# Patient Record
Sex: Female | Born: 1955 | Race: Black or African American | Hispanic: No | Smoking: Former smoker
Health system: Southern US, Community
[De-identification: ages and names within clinical notes are randomized; demographics above are authoritative.]

## PROBLEM LIST (undated history)

## (undated) DIAGNOSIS — T7840XA Allergy, unspecified, initial encounter: Secondary | ICD-10-CM

## (undated) DIAGNOSIS — I10 Essential (primary) hypertension: Secondary | ICD-10-CM

## (undated) DIAGNOSIS — F1911 Other psychoactive substance abuse, in remission: Secondary | ICD-10-CM

## (undated) DIAGNOSIS — F329 Major depressive disorder, single episode, unspecified: Secondary | ICD-10-CM

## (undated) DIAGNOSIS — G510 Bell's palsy: Secondary | ICD-10-CM

## (undated) DIAGNOSIS — R87619 Unspecified abnormal cytological findings in specimens from cervix uteri: Secondary | ICD-10-CM

## (undated) DIAGNOSIS — F32A Depression, unspecified: Secondary | ICD-10-CM

## (undated) DIAGNOSIS — M1990 Unspecified osteoarthritis, unspecified site: Secondary | ICD-10-CM

## (undated) DIAGNOSIS — J45909 Unspecified asthma, uncomplicated: Secondary | ICD-10-CM

## (undated) DIAGNOSIS — F419 Anxiety disorder, unspecified: Secondary | ICD-10-CM

## (undated) DIAGNOSIS — A539 Syphilis, unspecified: Secondary | ICD-10-CM

## (undated) DIAGNOSIS — B20 Human immunodeficiency virus [HIV] disease: Secondary | ICD-10-CM

## (undated) HISTORY — DX: Essential (primary) hypertension: I10

## (undated) HISTORY — DX: Depression, unspecified: F32.A

## (undated) HISTORY — DX: Unspecified asthma, uncomplicated: J45.909

## (undated) HISTORY — DX: Unspecified osteoarthritis, unspecified site: M19.90

## (undated) HISTORY — DX: Syphilis, unspecified: A53.9

## (undated) HISTORY — DX: Bell's palsy: G51.0

## (undated) HISTORY — DX: Unspecified abnormal cytological findings in specimens from cervix uteri: R87.619

## (undated) HISTORY — DX: Other psychoactive substance abuse, in remission: F19.11

## (undated) HISTORY — DX: Human immunodeficiency virus (HIV) disease: B20

## (undated) HISTORY — PX: CHOLECYSTECTOMY: SHX55

## (undated) HISTORY — DX: Major depressive disorder, single episode, unspecified: F32.9

## (undated) HISTORY — DX: Allergy, unspecified, initial encounter: T78.40XA

## (undated) HISTORY — DX: Anxiety disorder, unspecified: F41.9

---

## 1988-11-04 DIAGNOSIS — Z21 Asymptomatic human immunodeficiency virus [HIV] infection status: Secondary | ICD-10-CM

## 1988-11-04 DIAGNOSIS — B20 Human immunodeficiency virus [HIV] disease: Secondary | ICD-10-CM

## 1988-11-04 HISTORY — DX: Asymptomatic human immunodeficiency virus (hiv) infection status: Z21

## 1988-11-04 HISTORY — DX: Human immunodeficiency virus (HIV) disease: B20

## 1996-05-04 ENCOUNTER — Encounter (INDEPENDENT_AMBULATORY_CARE_PROVIDER_SITE_OTHER): Payer: Self-pay | Admitting: *Deleted

## 1996-05-04 LAB — CONVERTED CEMR LAB
CD4 Count: 210 microliters
CD4 T Cell Abs: 210

## 1999-03-06 ENCOUNTER — Ambulatory Visit (HOSPITAL_COMMUNITY): Admission: RE | Admit: 1999-03-06 | Discharge: 1999-03-06 | Payer: Self-pay | Admitting: Internal Medicine

## 1999-03-20 ENCOUNTER — Encounter: Admission: RE | Admit: 1999-03-20 | Discharge: 1999-03-20 | Payer: Self-pay | Admitting: Internal Medicine

## 1999-03-20 ENCOUNTER — Ambulatory Visit (HOSPITAL_COMMUNITY): Admission: RE | Admit: 1999-03-20 | Discharge: 1999-03-20 | Payer: Self-pay | Admitting: Internal Medicine

## 1999-04-10 ENCOUNTER — Encounter: Admission: RE | Admit: 1999-04-10 | Discharge: 1999-04-10 | Payer: Self-pay | Admitting: Internal Medicine

## 1999-11-20 ENCOUNTER — Other Ambulatory Visit: Admission: RE | Admit: 1999-11-20 | Discharge: 1999-11-20 | Payer: Self-pay | Admitting: Obstetrics and Gynecology

## 2002-01-08 ENCOUNTER — Encounter: Payer: Self-pay | Admitting: Family Medicine

## 2002-01-08 ENCOUNTER — Encounter: Admission: RE | Admit: 2002-01-08 | Discharge: 2002-01-08 | Payer: Self-pay | Admitting: Family Medicine

## 2002-01-18 ENCOUNTER — Encounter: Admission: RE | Admit: 2002-01-18 | Discharge: 2002-01-18 | Payer: Self-pay | Admitting: Family Medicine

## 2002-01-18 ENCOUNTER — Encounter: Payer: Self-pay | Admitting: Family Medicine

## 2002-01-25 ENCOUNTER — Encounter: Admission: RE | Admit: 2002-01-25 | Discharge: 2002-01-25 | Payer: Self-pay | Admitting: Family Medicine

## 2002-01-25 ENCOUNTER — Encounter: Payer: Self-pay | Admitting: Family Medicine

## 2002-07-27 ENCOUNTER — Encounter: Admission: RE | Admit: 2002-07-27 | Discharge: 2002-07-27 | Payer: Self-pay | Admitting: Internal Medicine

## 2002-07-27 ENCOUNTER — Ambulatory Visit (HOSPITAL_COMMUNITY): Admission: RE | Admit: 2002-07-27 | Discharge: 2002-07-27 | Payer: Self-pay | Admitting: Internal Medicine

## 2002-08-17 ENCOUNTER — Encounter: Admission: RE | Admit: 2002-08-17 | Discharge: 2002-08-17 | Payer: Self-pay | Admitting: Internal Medicine

## 2002-09-28 ENCOUNTER — Ambulatory Visit (HOSPITAL_COMMUNITY): Admission: RE | Admit: 2002-09-28 | Discharge: 2002-09-28 | Payer: Self-pay | Admitting: Internal Medicine

## 2002-09-28 ENCOUNTER — Encounter: Admission: RE | Admit: 2002-09-28 | Discharge: 2002-09-28 | Payer: Self-pay | Admitting: Internal Medicine

## 2002-10-12 ENCOUNTER — Encounter: Admission: RE | Admit: 2002-10-12 | Discharge: 2002-10-12 | Payer: Self-pay | Admitting: Internal Medicine

## 2002-11-23 ENCOUNTER — Encounter: Admission: RE | Admit: 2002-11-23 | Discharge: 2002-11-23 | Payer: Self-pay | Admitting: Internal Medicine

## 2002-11-23 ENCOUNTER — Ambulatory Visit (HOSPITAL_COMMUNITY): Admission: RE | Admit: 2002-11-23 | Discharge: 2002-11-23 | Payer: Self-pay | Admitting: Internal Medicine

## 2002-12-28 ENCOUNTER — Encounter: Admission: RE | Admit: 2002-12-28 | Discharge: 2002-12-28 | Payer: Self-pay | Admitting: Internal Medicine

## 2003-01-14 ENCOUNTER — Encounter: Admission: RE | Admit: 2003-01-14 | Discharge: 2003-01-14 | Payer: Self-pay | Admitting: Infectious Diseases

## 2003-02-14 ENCOUNTER — Encounter: Payer: Self-pay | Admitting: Internal Medicine

## 2003-02-14 ENCOUNTER — Encounter: Admission: RE | Admit: 2003-02-14 | Discharge: 2003-02-14 | Payer: Self-pay | Admitting: Internal Medicine

## 2003-03-08 ENCOUNTER — Encounter: Admission: RE | Admit: 2003-03-08 | Discharge: 2003-03-08 | Payer: Self-pay | Admitting: Internal Medicine

## 2003-06-23 ENCOUNTER — Encounter: Payer: Self-pay | Admitting: Internal Medicine

## 2003-06-23 ENCOUNTER — Encounter: Admission: RE | Admit: 2003-06-23 | Discharge: 2003-06-23 | Payer: Self-pay | Admitting: Internal Medicine

## 2003-06-23 ENCOUNTER — Ambulatory Visit (HOSPITAL_COMMUNITY): Admission: RE | Admit: 2003-06-23 | Discharge: 2003-06-23 | Payer: Self-pay | Admitting: Internal Medicine

## 2003-10-11 ENCOUNTER — Ambulatory Visit (HOSPITAL_COMMUNITY): Admission: RE | Admit: 2003-10-11 | Discharge: 2003-10-11 | Payer: Self-pay | Admitting: Internal Medicine

## 2003-10-11 ENCOUNTER — Encounter: Payer: Self-pay | Admitting: Internal Medicine

## 2003-10-11 ENCOUNTER — Encounter: Admission: RE | Admit: 2003-10-11 | Discharge: 2003-10-11 | Payer: Self-pay | Admitting: Internal Medicine

## 2003-10-27 ENCOUNTER — Other Ambulatory Visit: Admission: RE | Admit: 2003-10-27 | Discharge: 2003-10-27 | Payer: Self-pay | Admitting: Obstetrics and Gynecology

## 2003-11-01 ENCOUNTER — Encounter: Admission: RE | Admit: 2003-11-01 | Discharge: 2003-11-01 | Payer: Self-pay | Admitting: Internal Medicine

## 2003-11-22 ENCOUNTER — Encounter: Admission: RE | Admit: 2003-11-22 | Discharge: 2003-11-22 | Payer: Self-pay | Admitting: Family Medicine

## 2004-01-17 ENCOUNTER — Encounter: Admission: RE | Admit: 2004-01-17 | Discharge: 2004-01-17 | Payer: Self-pay | Admitting: Internal Medicine

## 2004-01-17 ENCOUNTER — Ambulatory Visit (HOSPITAL_COMMUNITY): Admission: RE | Admit: 2004-01-17 | Discharge: 2004-01-17 | Payer: Self-pay | Admitting: Internal Medicine

## 2004-01-31 ENCOUNTER — Encounter: Admission: RE | Admit: 2004-01-31 | Discharge: 2004-01-31 | Payer: Self-pay | Admitting: Internal Medicine

## 2004-04-17 ENCOUNTER — Encounter: Admission: RE | Admit: 2004-04-17 | Discharge: 2004-04-17 | Payer: Self-pay | Admitting: Internal Medicine

## 2004-04-17 ENCOUNTER — Ambulatory Visit (HOSPITAL_COMMUNITY): Admission: RE | Admit: 2004-04-17 | Discharge: 2004-04-17 | Payer: Self-pay | Admitting: Internal Medicine

## 2004-05-01 ENCOUNTER — Encounter: Admission: RE | Admit: 2004-05-01 | Discharge: 2004-05-01 | Payer: Self-pay | Admitting: Internal Medicine

## 2004-06-07 ENCOUNTER — Encounter: Admission: RE | Admit: 2004-06-07 | Discharge: 2004-06-07 | Payer: Self-pay | Admitting: Infectious Diseases

## 2004-06-10 ENCOUNTER — Emergency Department (HOSPITAL_COMMUNITY): Admission: EM | Admit: 2004-06-10 | Discharge: 2004-06-11 | Payer: Self-pay | Admitting: Emergency Medicine

## 2004-06-13 ENCOUNTER — Encounter: Admission: RE | Admit: 2004-06-13 | Discharge: 2004-06-13 | Payer: Self-pay | Admitting: Internal Medicine

## 2004-06-13 ENCOUNTER — Ambulatory Visit (HOSPITAL_COMMUNITY): Admission: RE | Admit: 2004-06-13 | Discharge: 2004-06-13 | Payer: Self-pay | Admitting: Obstetrics and Gynecology

## 2004-08-15 ENCOUNTER — Ambulatory Visit: Payer: Self-pay | Admitting: Internal Medicine

## 2004-08-15 ENCOUNTER — Ambulatory Visit (HOSPITAL_COMMUNITY): Admission: RE | Admit: 2004-08-15 | Discharge: 2004-08-15 | Payer: Self-pay | Admitting: Internal Medicine

## 2004-08-28 ENCOUNTER — Ambulatory Visit: Payer: Self-pay | Admitting: Internal Medicine

## 2005-03-06 ENCOUNTER — Ambulatory Visit (HOSPITAL_COMMUNITY): Admission: RE | Admit: 2005-03-06 | Discharge: 2005-03-06 | Payer: Self-pay | Admitting: Internal Medicine

## 2005-03-06 ENCOUNTER — Ambulatory Visit: Payer: Self-pay | Admitting: Internal Medicine

## 2005-03-19 ENCOUNTER — Ambulatory Visit: Payer: Self-pay | Admitting: Internal Medicine

## 2005-05-10 ENCOUNTER — Ambulatory Visit: Payer: Self-pay | Admitting: Internal Medicine

## 2005-06-21 ENCOUNTER — Ambulatory Visit (HOSPITAL_COMMUNITY): Admission: RE | Admit: 2005-06-21 | Discharge: 2005-06-21 | Payer: Self-pay | Admitting: Infectious Diseases

## 2005-06-21 ENCOUNTER — Ambulatory Visit: Payer: Self-pay | Admitting: Infectious Diseases

## 2005-09-10 ENCOUNTER — Ambulatory Visit: Payer: Self-pay | Admitting: Infectious Diseases

## 2005-09-10 ENCOUNTER — Ambulatory Visit (HOSPITAL_COMMUNITY): Admission: RE | Admit: 2005-09-10 | Discharge: 2005-09-10 | Payer: Self-pay | Admitting: Internal Medicine

## 2005-09-10 ENCOUNTER — Encounter (INDEPENDENT_AMBULATORY_CARE_PROVIDER_SITE_OTHER): Payer: Self-pay | Admitting: *Deleted

## 2005-09-10 LAB — CONVERTED CEMR LAB
CD4 Count: 390 microliters
HIV 1 RNA Quant: 399 copies/mL

## 2005-09-23 ENCOUNTER — Ambulatory Visit: Payer: Self-pay | Admitting: Infectious Diseases

## 2006-04-02 ENCOUNTER — Encounter: Admission: RE | Admit: 2006-04-02 | Discharge: 2006-04-02 | Payer: Self-pay | Admitting: Internal Medicine

## 2006-04-02 ENCOUNTER — Encounter (INDEPENDENT_AMBULATORY_CARE_PROVIDER_SITE_OTHER): Payer: Self-pay | Admitting: *Deleted

## 2006-04-02 ENCOUNTER — Ambulatory Visit: Payer: Self-pay | Admitting: Internal Medicine

## 2006-04-02 LAB — CONVERTED CEMR LAB
CD4 Count: 520 microliters
HIV 1 RNA Quant: 399 copies/mL

## 2006-04-15 ENCOUNTER — Ambulatory Visit: Payer: Self-pay | Admitting: Internal Medicine

## 2006-08-07 ENCOUNTER — Ambulatory Visit: Payer: Self-pay | Admitting: Family Medicine

## 2006-09-06 DIAGNOSIS — F411 Generalized anxiety disorder: Secondary | ICD-10-CM | POA: Insufficient documentation

## 2006-09-06 DIAGNOSIS — F329 Major depressive disorder, single episode, unspecified: Secondary | ICD-10-CM

## 2006-09-06 DIAGNOSIS — R87619 Unspecified abnormal cytological findings in specimens from cervix uteri: Secondary | ICD-10-CM | POA: Insufficient documentation

## 2006-09-06 DIAGNOSIS — B20 Human immunodeficiency virus [HIV] disease: Secondary | ICD-10-CM | POA: Insufficient documentation

## 2006-09-06 DIAGNOSIS — F32A Depression, unspecified: Secondary | ICD-10-CM | POA: Insufficient documentation

## 2006-09-06 DIAGNOSIS — A539 Syphilis, unspecified: Secondary | ICD-10-CM | POA: Insufficient documentation

## 2006-09-06 DIAGNOSIS — G510 Bell's palsy: Secondary | ICD-10-CM | POA: Insufficient documentation

## 2006-09-06 DIAGNOSIS — A6000 Herpesviral infection of urogenital system, unspecified: Secondary | ICD-10-CM | POA: Insufficient documentation

## 2006-10-13 ENCOUNTER — Ambulatory Visit: Payer: Self-pay | Admitting: Family Medicine

## 2006-11-06 ENCOUNTER — Ambulatory Visit: Payer: Self-pay | Admitting: Internal Medicine

## 2006-11-12 ENCOUNTER — Ambulatory Visit: Payer: Self-pay | Admitting: Family Medicine

## 2006-12-02 ENCOUNTER — Encounter: Admission: RE | Admit: 2006-12-02 | Discharge: 2006-12-02 | Payer: Self-pay | Admitting: Internal Medicine

## 2006-12-02 ENCOUNTER — Ambulatory Visit: Payer: Self-pay | Admitting: Internal Medicine

## 2006-12-02 ENCOUNTER — Encounter (INDEPENDENT_AMBULATORY_CARE_PROVIDER_SITE_OTHER): Payer: Self-pay | Admitting: *Deleted

## 2006-12-02 LAB — CONVERTED CEMR LAB
ALT: 18 units/L (ref 0–35)
AST: 15 units/L (ref 0–37)
Albumin: 3.9 g/dL (ref 3.5–5.2)
Alkaline Phosphatase: 108 units/L (ref 39–117)
BUN: 10 mg/dL (ref 6–23)
Basophils Absolute: 0 10*3/uL (ref 0.0–0.1)
Basophils Relative: 1 % (ref 0–1)
CD4 Count: 370 microliters
CO2: 28 meq/L (ref 19–32)
Calcium: 8.8 mg/dL (ref 8.4–10.5)
Chloride: 106 meq/L (ref 96–112)
Creatinine, Ser: 0.85 mg/dL (ref 0.40–1.20)
Eosinophils Absolute: 0.3 10*3/uL (ref 0.0–0.7)
Eosinophils Relative: 8 % — ABNORMAL HIGH (ref 0–5)
Glucose, Bld: 84 mg/dL (ref 70–99)
HCT: 37.9 % (ref 36.0–46.0)
HIV 1 RNA Quant: 49 copies/mL
HIV 1 RNA Quant: <50 copies/mL (ref <50)
HIV-1 RNA Quant, Log: <1.7 (ref <1.70)
Hemoglobin: 12.2 g/dL (ref 12.0–15.0)
Lymphocytes Relative: 53 % — ABNORMAL HIGH (ref 12–46)
Lymphs Abs: 1.7 10*3/uL (ref 0.7–3.3)
MCHC: 32.2 g/dL (ref 30.0–36.0)
MCV: 90 fL (ref 78.0–100.0)
Monocytes Absolute: 0.3 10*3/uL (ref 0.2–0.7)
Monocytes Relative: 10 % (ref 3–11)
Neutro Abs: 0.9 10*3/uL — ABNORMAL LOW (ref 1.7–7.7)
Neutrophils Relative %: 28 % — ABNORMAL LOW (ref 43–77)
Platelets: 211 10*3/uL (ref 150–400)
Potassium: 4 meq/L (ref 3.5–5.3)
RBC: 4.21 M/uL (ref 3.87–5.11)
RDW: 13.6 % (ref 11.5–14.0)
Sodium: 142 meq/L (ref 135–145)
Total Bilirubin: 0.3 mg/dL (ref 0.3–1.2)
Total Protein: 6.7 g/dL (ref 6.0–8.3)
WBC: 3.3 10*3/uL — ABNORMAL LOW (ref 4.0–10.5)

## 2006-12-16 ENCOUNTER — Ambulatory Visit: Payer: Self-pay | Admitting: Internal Medicine

## 2006-12-25 ENCOUNTER — Encounter: Payer: Self-pay | Admitting: Licensed Clinical Social Worker

## 2006-12-29 ENCOUNTER — Encounter (INDEPENDENT_AMBULATORY_CARE_PROVIDER_SITE_OTHER): Payer: Self-pay | Admitting: *Deleted

## 2006-12-29 LAB — CONVERTED CEMR LAB: Pap Smear: ABNORMAL

## 2007-01-01 ENCOUNTER — Encounter: Payer: Self-pay | Admitting: Internal Medicine

## 2007-01-01 ENCOUNTER — Encounter: Payer: Self-pay | Admitting: Licensed Clinical Social Worker

## 2007-01-02 ENCOUNTER — Encounter: Payer: Self-pay | Admitting: Internal Medicine

## 2007-01-11 ENCOUNTER — Encounter (INDEPENDENT_AMBULATORY_CARE_PROVIDER_SITE_OTHER): Payer: Self-pay | Admitting: *Deleted

## 2007-01-15 ENCOUNTER — Encounter (INDEPENDENT_AMBULATORY_CARE_PROVIDER_SITE_OTHER): Payer: Self-pay | Admitting: *Deleted

## 2007-07-08 ENCOUNTER — Encounter: Admission: RE | Admit: 2007-07-08 | Discharge: 2007-07-08 | Payer: Self-pay | Admitting: Internal Medicine

## 2007-07-08 ENCOUNTER — Ambulatory Visit: Payer: Self-pay | Admitting: Internal Medicine

## 2007-07-08 LAB — CONVERTED CEMR LAB
ALT: 24 units/L (ref 0–35)
AST: 20 units/L (ref 0–37)
Albumin: 4.1 g/dL (ref 3.5–5.2)
Alkaline Phosphatase: 109 units/L (ref 39–117)
BUN: 14 mg/dL (ref 6–23)
Basophils Absolute: 0 10*3/uL (ref 0.0–0.1)
Basophils Relative: 0 % (ref 0–1)
CO2: 21 meq/L (ref 19–32)
Calcium: 8.7 mg/dL (ref 8.4–10.5)
Chloride: 109 meq/L (ref 96–112)
Creatinine, Ser: 1 mg/dL (ref 0.40–1.20)
Eosinophils Absolute: 0.2 10*3/uL (ref 0.0–0.7)
Eosinophils Relative: 4 % (ref 0–5)
Glucose, Bld: 95 mg/dL (ref 70–99)
HCT: 39.8 % (ref 36.0–46.0)
HIV 1 RNA Quant: <50 copies/mL (ref <50)
HIV-1 RNA Quant, Log: <1.7 (ref <1.70)
Hemoglobin: 13.1 g/dL (ref 12.0–15.0)
Lymphocytes Relative: 43 % (ref 12–46)
Lymphs Abs: 1.9 10*3/uL (ref 0.7–3.3)
MCHC: 32.9 g/dL (ref 30.0–36.0)
MCV: 88.6 fL (ref 78.0–100.0)
Monocytes Absolute: 0.4 10*3/uL (ref 0.2–0.7)
Monocytes Relative: 9 % (ref 3–11)
Neutro Abs: 2 10*3/uL (ref 1.7–7.7)
Neutrophils Relative %: 43 % (ref 43–77)
Platelets: 226 10*3/uL (ref 150–400)
Potassium: 4.2 meq/L (ref 3.5–5.3)
RBC: 4.49 M/uL (ref 3.87–5.11)
RDW: 13.9 % (ref 11.5–14.0)
Sodium: 142 meq/L (ref 135–145)
Total Bilirubin: 0.4 mg/dL (ref 0.3–1.2)
Total Protein: 7.2 g/dL (ref 6.0–8.3)
WBC: 4.5 10*3/uL (ref 4.0–10.5)

## 2007-07-22 ENCOUNTER — Ambulatory Visit: Payer: Self-pay | Admitting: Internal Medicine

## 2007-08-06 ENCOUNTER — Telehealth (INDEPENDENT_AMBULATORY_CARE_PROVIDER_SITE_OTHER): Payer: Self-pay | Admitting: *Deleted

## 2007-08-10 ENCOUNTER — Telehealth: Payer: Self-pay | Admitting: Internal Medicine

## 2007-09-01 ENCOUNTER — Encounter: Admission: RE | Admit: 2007-09-01 | Discharge: 2007-09-01 | Payer: Self-pay | Admitting: Internal Medicine

## 2007-09-01 ENCOUNTER — Ambulatory Visit: Payer: Self-pay | Admitting: Internal Medicine

## 2007-09-01 LAB — CONVERTED CEMR LAB
HIV 1 RNA Quant: 139 copies/mL — ABNORMAL HIGH (ref <50)
HIV-1 RNA Quant, Log: 2.14 — ABNORMAL HIGH (ref <1.70)

## 2007-10-27 ENCOUNTER — Ambulatory Visit: Payer: Self-pay | Admitting: Internal Medicine

## 2007-11-02 ENCOUNTER — Encounter (INDEPENDENT_AMBULATORY_CARE_PROVIDER_SITE_OTHER): Payer: Self-pay | Admitting: *Deleted

## 2007-11-02 ENCOUNTER — Telehealth: Payer: Self-pay | Admitting: Internal Medicine

## 2007-11-03 ENCOUNTER — Ambulatory Visit: Payer: Self-pay | Admitting: Internal Medicine

## 2007-11-03 ENCOUNTER — Encounter (INDEPENDENT_AMBULATORY_CARE_PROVIDER_SITE_OTHER): Payer: Self-pay | Admitting: *Deleted

## 2007-11-19 ENCOUNTER — Telehealth: Payer: Self-pay | Admitting: Internal Medicine

## 2007-12-13 ENCOUNTER — Emergency Department (HOSPITAL_COMMUNITY): Admission: EM | Admit: 2007-12-13 | Discharge: 2007-12-13 | Payer: Self-pay | Admitting: Emergency Medicine

## 2007-12-16 ENCOUNTER — Encounter (INDEPENDENT_AMBULATORY_CARE_PROVIDER_SITE_OTHER): Payer: Self-pay | Admitting: *Deleted

## 2008-01-08 ENCOUNTER — Ambulatory Visit: Payer: Self-pay | Admitting: Internal Medicine

## 2008-01-08 ENCOUNTER — Encounter: Admission: RE | Admit: 2008-01-08 | Discharge: 2008-01-08 | Payer: Self-pay | Admitting: Internal Medicine

## 2008-01-08 LAB — CONVERTED CEMR LAB
ALT: 19 units/L (ref 0–35)
AST: 20 units/L (ref 0–37)
Albumin: 3.8 g/dL (ref 3.5–5.2)
Alkaline Phosphatase: 65 units/L (ref 39–117)
BUN: 10 mg/dL (ref 6–23)
Basophils Absolute: 0 10*3/uL (ref 0.0–0.1)
Basophils Relative: 1 % (ref 0–1)
CO2: 21 meq/L (ref 19–32)
Calcium: 8.8 mg/dL (ref 8.4–10.5)
Chloride: 107 meq/L (ref 96–112)
Cholesterol: 197 mg/dL (ref 0–200)
Creatinine, Ser: 0.87 mg/dL (ref 0.40–1.20)
Eosinophils Absolute: 0.1 10*3/uL (ref 0.0–0.7)
Eosinophils Relative: 3 % (ref 0–5)
Glucose, Bld: 87 mg/dL (ref 70–99)
HCT: 38.5 % (ref 36.0–46.0)
HDL: 43 mg/dL (ref >39)
HIV 1 RNA Quant: 133 copies/mL — ABNORMAL HIGH (ref <50)
HIV-1 RNA Quant, Log: 2.12 — ABNORMAL HIGH (ref <1.70)
Hemoglobin: 12.3 g/dL (ref 12.0–15.0)
LDL Cholesterol: 118 mg/dL — ABNORMAL HIGH (ref 0–99)
Lymphocytes Relative: 62 % — ABNORMAL HIGH (ref 12–46)
Lymphs Abs: 2.5 10*3/uL (ref 0.7–4.0)
MCHC: 31.9 g/dL (ref 30.0–36.0)
MCV: 88.9 fL (ref 78.0–100.0)
Monocytes Absolute: 0.4 10*3/uL (ref 0.1–1.0)
Monocytes Relative: 9 % (ref 3–12)
Neutro Abs: 1 10*3/uL — ABNORMAL LOW (ref 1.7–7.7)
Neutrophils Relative %: 26 % — ABNORMAL LOW (ref 43–77)
Platelets: 185 10*3/uL (ref 150–400)
Potassium: 3.9 meq/L (ref 3.5–5.3)
RBC: 4.33 M/uL (ref 3.87–5.11)
RDW: 15.2 % (ref 11.5–15.5)
Sodium: 141 meq/L (ref 135–145)
Total Bilirubin: 0.9 mg/dL (ref 0.3–1.2)
Total CHOL/HDL Ratio: 4.6
Total Protein: 7.3 g/dL (ref 6.0–8.3)
Triglycerides: 182 mg/dL — ABNORMAL HIGH (ref <150)
VLDL: 36 mg/dL (ref 0–40)
WBC: 4 10*3/uL (ref 4.0–10.5)

## 2008-01-22 ENCOUNTER — Encounter (INDEPENDENT_AMBULATORY_CARE_PROVIDER_SITE_OTHER): Payer: Self-pay | Admitting: *Deleted

## 2008-04-04 ENCOUNTER — Telehealth (INDEPENDENT_AMBULATORY_CARE_PROVIDER_SITE_OTHER): Payer: Self-pay | Admitting: *Deleted

## 2008-04-05 ENCOUNTER — Ambulatory Visit: Payer: Self-pay | Admitting: Internal Medicine

## 2008-04-07 ENCOUNTER — Telehealth: Payer: Self-pay | Admitting: Internal Medicine

## 2008-05-02 ENCOUNTER — Encounter: Payer: Self-pay | Admitting: Internal Medicine

## 2008-05-02 ENCOUNTER — Ambulatory Visit: Payer: Self-pay | Admitting: Internal Medicine

## 2008-05-02 LAB — CONVERTED CEMR LAB: Pap Smear: NORMAL

## 2008-05-10 ENCOUNTER — Encounter: Payer: Self-pay | Admitting: Internal Medicine

## 2008-05-31 ENCOUNTER — Telehealth: Payer: Self-pay | Admitting: Internal Medicine

## 2008-06-02 ENCOUNTER — Ambulatory Visit: Payer: Self-pay | Admitting: Internal Medicine

## 2008-06-10 ENCOUNTER — Emergency Department (HOSPITAL_COMMUNITY): Admission: EM | Admit: 2008-06-10 | Discharge: 2008-06-10 | Payer: Self-pay | Admitting: Emergency Medicine

## 2008-10-07 ENCOUNTER — Telehealth: Payer: Self-pay | Admitting: Internal Medicine

## 2008-10-07 ENCOUNTER — Encounter (INDEPENDENT_AMBULATORY_CARE_PROVIDER_SITE_OTHER): Payer: Self-pay | Admitting: *Deleted

## 2008-12-12 ENCOUNTER — Emergency Department (HOSPITAL_COMMUNITY): Admission: EM | Admit: 2008-12-12 | Discharge: 2008-12-12 | Payer: Self-pay | Admitting: Family Medicine

## 2008-12-14 ENCOUNTER — Emergency Department (HOSPITAL_COMMUNITY): Admission: EM | Admit: 2008-12-14 | Discharge: 2008-12-14 | Payer: Self-pay | Admitting: Family Medicine

## 2008-12-15 ENCOUNTER — Telehealth (INDEPENDENT_AMBULATORY_CARE_PROVIDER_SITE_OTHER): Payer: Self-pay | Admitting: *Deleted

## 2009-03-02 ENCOUNTER — Encounter: Payer: Self-pay | Admitting: Family Medicine

## 2009-03-02 ENCOUNTER — Ambulatory Visit: Payer: Self-pay | Admitting: Family Medicine

## 2009-03-02 ENCOUNTER — Encounter (INDEPENDENT_AMBULATORY_CARE_PROVIDER_SITE_OTHER): Payer: Self-pay | Admitting: *Deleted

## 2009-03-02 ENCOUNTER — Other Ambulatory Visit: Admission: RE | Admit: 2009-03-02 | Discharge: 2009-03-02 | Payer: Self-pay | Admitting: Family Medicine

## 2009-03-02 LAB — CONVERTED CEMR LAB: Pap Smear: NEGATIVE

## 2009-03-07 ENCOUNTER — Encounter (INDEPENDENT_AMBULATORY_CARE_PROVIDER_SITE_OTHER): Payer: Self-pay | Admitting: *Deleted

## 2009-03-07 ENCOUNTER — Telehealth (INDEPENDENT_AMBULATORY_CARE_PROVIDER_SITE_OTHER): Payer: Self-pay | Admitting: *Deleted

## 2009-03-22 ENCOUNTER — Ambulatory Visit: Payer: Self-pay | Admitting: Hematology & Oncology

## 2009-03-22 ENCOUNTER — Ambulatory Visit: Payer: Self-pay | Admitting: Family Medicine

## 2009-03-22 DIAGNOSIS — D708 Other neutropenia: Secondary | ICD-10-CM | POA: Insufficient documentation

## 2009-03-22 DIAGNOSIS — J329 Chronic sinusitis, unspecified: Secondary | ICD-10-CM | POA: Insufficient documentation

## 2009-03-22 LAB — CONVERTED CEMR LAB
ALT: 41 units/L — ABNORMAL HIGH (ref 0–35)
AST: 37 units/L (ref 0–37)
Albumin: 3.3 g/dL — ABNORMAL LOW (ref 3.5–5.2)
Alkaline Phosphatase: 93 units/L (ref 39–117)
Basophils Absolute: 0 10*3/uL (ref 0.0–0.1)
Basophils Relative: 0.3 % (ref 0.0–3.0)
Bilirubin, Direct: 0 mg/dL (ref 0.0–0.3)
Eosinophils Absolute: 0.1 10*3/uL (ref 0.0–0.7)
Eosinophils Relative: 3.5 % (ref 0.0–5.0)
HCT: 36.6 % (ref 36.0–46.0)
Hemoglobin: 12.3 g/dL (ref 12.0–15.0)
Lymphocytes Relative: 60.8 % — ABNORMAL HIGH (ref 12.0–46.0)
Lymphs Abs: 2.6 10*3/uL (ref 0.7–4.0)
MCHC: 33.6 g/dL (ref 30.0–36.0)
MCV: 85.1 fL (ref 78.0–100.0)
Monocytes Absolute: 0.5 10*3/uL (ref 0.1–1.0)
Monocytes Relative: 11.6 % (ref 3.0–12.0)
Neutro Abs: 1 10*3/uL — ABNORMAL LOW (ref 1.4–7.7)
Neutrophils Relative %: 23.8 % — ABNORMAL LOW (ref 43.0–77.0)
Platelets: 174 10*3/uL (ref 150.0–400.0)
RBC: 4.29 M/uL (ref 3.87–5.11)
RDW: 12.7 % (ref 11.5–14.6)
Total Bilirubin: 0.8 mg/dL (ref 0.3–1.2)
Total Protein: 7.8 g/dL (ref 6.0–8.3)
WBC: 4.2 10*3/uL — ABNORMAL LOW (ref 4.5–10.5)

## 2009-04-21 ENCOUNTER — Encounter: Payer: Self-pay | Admitting: Family Medicine

## 2009-04-21 LAB — CBC WITH DIFFERENTIAL (CANCER CENTER ONLY)
BASO#: 0 10*3/uL (ref 0.0–0.2)
BASO%: 0.5 % (ref 0.0–2.0)
EOS%: 1.6 % (ref 0.0–7.0)
Eosinophils Absolute: 0.1 10*3/uL (ref 0.0–0.5)
HCT: 38.1 % (ref 34.8–46.6)
HGB: 12.3 g/dL (ref 11.6–15.9)
LYMPH#: 2.9 10*3/uL (ref 0.9–3.3)
LYMPH%: 68.1 % — ABNORMAL HIGH (ref 14.0–48.0)
MCH: 27 pg (ref 26.0–34.0)
MCHC: 32.3 g/dL (ref 32.0–36.0)
MCV: 84 fL (ref 81–101)
MONO#: 0.3 10*3/uL (ref 0.1–0.9)
MONO%: 7.6 % (ref 0.0–13.0)
NEUT#: 0.9 10*3/uL — ABNORMAL LOW (ref 1.5–6.5)
NEUT%: 22.2 % — ABNORMAL LOW (ref 39.6–80.0)
Platelets: 206 10*3/uL (ref 145–400)
RBC: 4.56 10*6/uL (ref 3.70–5.32)
RDW: 11.7 % (ref 10.5–14.6)
WBC: 4.2 10*3/uL (ref 3.9–10.0)

## 2009-04-21 LAB — CHCC SATELLITE - SMEAR

## 2009-04-25 LAB — PROTEIN ELECTROPHORESIS, SERUM
Albumin ELP: 48.2 % — ABNORMAL LOW (ref 55.8–66.1)
Alpha-1-Globulin: 3.6 % (ref 2.9–4.9)
Alpha-2-Globulin: 8.9 % (ref 7.1–11.8)
Beta 2: 6.5 % (ref 3.2–6.5)
Beta Globulin: 6.9 % (ref 4.7–7.2)
Gamma Globulin: 25.9 % — ABNORMAL HIGH (ref 11.1–18.8)
Total Protein, Serum Electrophoresis: 8.5 g/dL — ABNORMAL HIGH (ref 6.0–8.3)

## 2009-04-25 LAB — ANA: Anti Nuclear Antibody(ANA): NEGATIVE

## 2009-04-25 LAB — RHEUMATOID FACTOR: Rhuematoid fact SerPl-aCnc: 22 IU/mL — ABNORMAL HIGH (ref 0–20)

## 2009-04-25 LAB — VITAMIN B12: Vitamin B-12: 763 pg/mL (ref 211–911)

## 2009-05-14 ENCOUNTER — Telehealth: Payer: Self-pay | Admitting: Family Medicine

## 2009-05-16 ENCOUNTER — Encounter (INDEPENDENT_AMBULATORY_CARE_PROVIDER_SITE_OTHER): Payer: Self-pay | Admitting: *Deleted

## 2009-06-26 ENCOUNTER — Ambulatory Visit: Payer: Self-pay | Admitting: Family Medicine

## 2009-06-29 ENCOUNTER — Telehealth (INDEPENDENT_AMBULATORY_CARE_PROVIDER_SITE_OTHER): Payer: Self-pay | Admitting: *Deleted

## 2009-07-06 ENCOUNTER — Ambulatory Visit: Payer: Self-pay | Admitting: Family Medicine

## 2009-08-03 ENCOUNTER — Ambulatory Visit: Payer: Self-pay | Admitting: Family Medicine

## 2009-08-03 DIAGNOSIS — J45901 Unspecified asthma with (acute) exacerbation: Secondary | ICD-10-CM | POA: Insufficient documentation

## 2009-11-24 ENCOUNTER — Encounter (INDEPENDENT_AMBULATORY_CARE_PROVIDER_SITE_OTHER): Payer: Self-pay | Admitting: *Deleted

## 2009-11-24 ENCOUNTER — Ambulatory Visit: Payer: Self-pay | Admitting: Family Medicine

## 2010-02-22 ENCOUNTER — Ambulatory Visit: Payer: Self-pay | Admitting: Family Medicine

## 2010-04-17 ENCOUNTER — Ambulatory Visit: Payer: Self-pay | Admitting: Family Medicine

## 2010-04-17 ENCOUNTER — Encounter (INDEPENDENT_AMBULATORY_CARE_PROVIDER_SITE_OTHER): Payer: Self-pay | Admitting: *Deleted

## 2010-04-18 ENCOUNTER — Telehealth: Payer: Self-pay | Admitting: Family Medicine

## 2010-04-19 ENCOUNTER — Telehealth (INDEPENDENT_AMBULATORY_CARE_PROVIDER_SITE_OTHER): Payer: Self-pay | Admitting: *Deleted

## 2010-04-24 ENCOUNTER — Telehealth (INDEPENDENT_AMBULATORY_CARE_PROVIDER_SITE_OTHER): Payer: Self-pay | Admitting: *Deleted

## 2010-05-23 ENCOUNTER — Encounter: Payer: Self-pay | Admitting: Internal Medicine

## 2010-05-29 ENCOUNTER — Encounter: Payer: Self-pay | Admitting: Internal Medicine

## 2010-07-20 ENCOUNTER — Encounter: Payer: Self-pay | Admitting: Internal Medicine

## 2010-07-23 ENCOUNTER — Encounter: Payer: Self-pay | Admitting: Family Medicine

## 2010-07-23 ENCOUNTER — Ambulatory Visit: Payer: Self-pay | Admitting: Internal Medicine

## 2010-07-23 LAB — CONVERTED CEMR LAB
ALT: 14 U/L (ref 0–35)
AST: 20 U/L (ref 0–37)
Albumin: 3.7 g/dL (ref 3.5–5.2)
Alkaline Phosphatase: 98 U/L (ref 39–117)
BUN: 8 mg/dL (ref 6–23)
Basophils Absolute: 0 K/uL (ref 0.0–0.1)
Basophils Relative: 1 % (ref 0–1)
CO2: 26 meq/L (ref 19–32)
Calcium: 9.1 mg/dL (ref 8.4–10.5)
Chloride: 106 meq/L (ref 96–112)
Creatinine, Ser: 0.96 mg/dL (ref 0.40–1.20)
Eosinophils Absolute: 0.1 K/uL (ref 0.0–0.7)
Eosinophils Relative: 3 % (ref 0–5)
Glucose, Bld: 85 mg/dL (ref 70–99)
HCT: 36.1 % (ref 36.0–46.0)
HIV 1 RNA Quant: <20 copies/mL (ref <20)
HIV-1 RNA Quant, Log: <1.3 (ref <1.30)
Hemoglobin: 12 g/dL (ref 12.0–15.0)
Lymphocytes Relative: 56 % — ABNORMAL HIGH (ref 12–46)
Lymphs Abs: 2.2 K/uL (ref 0.7–4.0)
MCHC: 33.2 g/dL (ref 30.0–36.0)
MCV: 87 fL (ref 78.0–100.0)
Monocytes Absolute: 0.3 K/uL (ref 0.1–1.0)
Monocytes Relative: 7 % (ref 3–12)
Neutro Abs: 1.3 K/uL — ABNORMAL LOW (ref 1.7–7.7)
Neutrophils Relative %: 33 % — ABNORMAL LOW (ref 43–77)
Platelets: 196 K/uL (ref 150–400)
Potassium: 3.7 meq/L (ref 3.5–5.3)
RBC: 4.15 M/uL (ref 3.87–5.11)
RDW: 14.9 % (ref 11.5–15.5)
Sodium: 140 meq/L (ref 135–145)
Total Bilirubin: 0.6 mg/dL (ref 0.3–1.2)
Total Protein: 7.6 g/dL (ref 6.0–8.3)
WBC: 3.9 10*3/microliter — ABNORMAL LOW (ref 4.0–10.5)

## 2010-08-01 ENCOUNTER — Encounter: Payer: Self-pay | Admitting: Family Medicine

## 2010-08-01 ENCOUNTER — Encounter: Admission: RE | Admit: 2010-08-01 | Discharge: 2010-08-01 | Payer: Self-pay | Admitting: Allergy

## 2010-09-25 ENCOUNTER — Encounter (INDEPENDENT_AMBULATORY_CARE_PROVIDER_SITE_OTHER): Payer: Self-pay | Admitting: *Deleted

## 2010-09-25 ENCOUNTER — Ambulatory Visit: Payer: Self-pay | Admitting: Internal Medicine

## 2010-10-19 ENCOUNTER — Ambulatory Visit: Payer: Self-pay | Admitting: Internal Medicine

## 2010-11-16 ENCOUNTER — Telehealth (INDEPENDENT_AMBULATORY_CARE_PROVIDER_SITE_OTHER): Payer: Self-pay | Admitting: *Deleted

## 2010-11-24 ENCOUNTER — Encounter: Payer: Self-pay | Admitting: Obstetrics and Gynecology

## 2010-12-02 LAB — CONVERTED CEMR LAB
ALT: 45 units/L — ABNORMAL HIGH (ref 0–35)
AST: 34 units/L (ref 0–37)
Albumin: 3.5 g/dL (ref 3.5–5.2)
Alkaline Phosphatase: 78 units/L (ref 39–117)
BUN: 13 mg/dL (ref 6–23)
Basophils Absolute: 0 10*3/uL (ref 0.0–0.1)
Basophils Relative: 0.1 % (ref 0.0–3.0)
Bilirubin, Direct: 0 mg/dL (ref 0.0–0.3)
CO2: 30 meq/L (ref 19–32)
Calcium: 9.1 mg/dL (ref 8.4–10.5)
Chloride: 109 meq/L (ref 96–112)
Cholesterol: 179 mg/dL (ref 0–200)
Creatinine, Ser: 0.9 mg/dL (ref 0.4–1.2)
Direct LDL: 102.1 mg/dL
Eosinophils Absolute: 0.1 10*3/uL (ref 0.0–0.7)
Eosinophils Relative: 1.6 % (ref 0.0–5.0)
GFR calc non Af Amer: 85.07 mL/min (ref >60)
Glucose, Bld: 88 mg/dL (ref 70–99)
HCT: 38.1 % (ref 36.0–46.0)
HDL: 34.1 mg/dL — ABNORMAL LOW (ref >39.00)
Hemoglobin: 12.7 g/dL (ref 12.0–15.0)
Lymphocytes Relative: 65.4 % — ABNORMAL HIGH (ref 12.0–46.0)
Lymphs Abs: 3 10*3/uL (ref 0.7–4.0)
MCHC: 33.2 g/dL (ref 30.0–36.0)
MCV: 85.3 fL (ref 78.0–100.0)
Monocytes Absolute: 0.5 10*3/uL (ref 0.1–1.0)
Monocytes Relative: 10.5 % (ref 3.0–12.0)
Neutro Abs: 1 10*3/uL — ABNORMAL LOW (ref 1.4–7.7)
Neutrophils Relative %: 22.4 % — ABNORMAL LOW (ref 43.0–77.0)
Platelets: 169 10*3/uL (ref 150.0–400.0)
Potassium: 4 meq/L (ref 3.5–5.1)
RBC: 4.47 M/uL (ref 3.87–5.11)
RDW: 12.9 % (ref 11.5–14.6)
Sodium: 142 meq/L (ref 135–145)
TSH: 2.24 microintl units/mL (ref 0.35–5.50)
Total Bilirubin: 1 mg/dL (ref 0.3–1.2)
Total CHOL/HDL Ratio: 5
Total Protein: 8.2 g/dL (ref 6.0–8.3)
Triglycerides: 210 mg/dL — ABNORMAL HIGH (ref 0.0–149.0)
VLDL: 42 mg/dL — ABNORMAL HIGH (ref 0.0–40.0)
WBC: 4.6 10*3/uL (ref 4.5–10.5)

## 2010-12-06 NOTE — Letter (Signed)
Summary: Out of Work  [facility] [employer]  [address] [place], [place] [address]   Phone: [phone]  Fax: [phone]    April 17, 2010   Employee:  Stephanie Espinoza    To Whom It May Concern:   For Medical reasons, please excuse the above named employee from work for the following dates:  Start:   April 17, 2010  End:   April 17, 2010  If you need additional information, please feel free to contact our office.         Sincerely,    Loreen Freud DO

## 2010-12-06 NOTE — Letter (Signed)
Summary: [facility]  [address]   [place], [place] [address]   Phone: [phone]  Fax: [phone]           May 29, 2010  [place] [address] [place], [place]  [address]  Dear Stephanie Espinoza,  I have left you a phone message asking you to call the Center for lab work and doctor appointments.  Dr. Orvan Falconer and I are concerned that you have not been for an appointment since 2009.  Please call the number above to make these important return appointments.  Thank you.  Sincerely,   Jennet Maduro [facility]

## 2010-12-06 NOTE — Miscellaneous (Signed)
  Clinical Lists Changes  Observations: Added new observation of YEARAIDSPOS: 2004  (09/25/2010 15:29) Added new observation of HIV STATUS: CDC-defined AIDS  (09/25/2010 15:29)

## 2010-12-06 NOTE — Miscellaneous (Signed)
Summary: 12/29/06 - Health Care Power of Attorney  Clinical Lists Changes  Directives: Added new directive of HEALTH CARE POWER OF ATTORNEY

## 2010-12-06 NOTE — Miscellaneous (Signed)
Summary: clinical update/ryan white  Clinical Lists Changes  Observations: Added new observation of MARITAL STAT: Divorced (01/22/2008 16:33) Added new observation of PAYOR: No Insurance (01/22/2008 16:33) Added new observation of PCTFPL: 131.86  (01/22/2008 16:33) Added new observation of HOUSEINCOME: 82956  (01/22/2008 16:33) Added new observation of FINASSESSDT: 01/08/2008  (01/22/2008 16:33)

## 2010-12-06 NOTE — Miscellaneous (Signed)
Summary: Orders Update - labs, pt. asked to make appt. for labs and MD  Clinical Lists Changes  Phone message left for pt. to call [facility], Dr. Celesta Aver office to make appts for Lab Work and MD appt.  Jennet Maduro RN  May 23, 2010 12:06 PM  Orders: Added new Test order of T-CBC w/Diff [phone]) - Signed Added new Test order of T-CD4SP [facility]) (CD4SP) - Signed Added new Test order of T-Comprehensive Metabolic Panel [phone]) - Signed Added new Test order of T-HIV Viral Load [phone]) - Signed Added new Test order of T-RPR (Syphilis) ([phone]) - Signed     Process Orders Check Orders Results:     [facility]: ABN not required for this insurance Order queued for requisitioning for [facility]: May 23, 2010 12:04 PM  Tests Sent for requisitioning (May 23, 2010 12:04 PM):     05/28/2010: [facility] -- T-CBC w/Diff [[id number]] (signed)     05/28/2010: [facility] -- T-Comprehensive Metabolic Panel [[id number]] (signed)     05/28/2010: [facility] -- T-HIV Viral Load [phone] (signed)     05/28/2010: [facility] -- T-RPR (Syphilis) [phone] (signed)

## 2010-12-06 NOTE — Assessment & Plan Note (Signed)
Summary: F/U OV/VS   Primary Provider:  Neena Rhymes MD  CC:  follow-up visit.  History of Present Illness: Stephanie Espinoza is in for her first visit since July of 2009.  She states that she has been doing well but just wanted to check in with me and let me know that she is doing okay.  She continues to work and is studying for her masters degree.  She is busy caring for her 53 year old child and also helping care for her grandchild.  She is not in relationship and has not been sexually active in 6 years.  She says she continues to cycle on and off her Truvada and Kaletra. She says she will take it consistently for about 6 months and then she will start to have problems that she believes are side effects.  Usually this will consist of urinary frequency and difficulty sleeping.  She checked her primary care doctor to see if she had diabetes but was told that she didn't.  After being off of her medication for two to 3 months she says that she will usually develop some sinus congestion and then decide to restart it.  She estimates that she cycled off of her medications 3 to 4 times since her last visit.  She was taking her medications when she came in for blood work in September but stopped taking them last month.  Preventive Screening-Counseling & Management  Alcohol-Tobacco     Alcohol drinks/day: 0     Smoking Status: quit > 6 months     Year Quit: 20 years ago     Pack years: 20  Caffeine-Diet-Exercise     Caffeine use/day: no     Does Patient Exercise: no     Type of exercise: walk dogs     Exercise (avg: min/session): <30     Times/week: 7  Hep-HIV-STD-Contraception     HIV Risk: no risk noted     HIV Risk Counseling: not indicated-no HIV risk noted     Dental Visit-last 6 months yes     Dental Care Counseling: not indicated; dental care within six months  Safety-Violence-Falls     Seat Belt Use: yes  Comments: declined condoms      Sexual History:  n/a.        Drug Use:  never.       Prior Medication List:  PROAIR HFA 108 (90 BASE) MCG/ACT AERS (ALBUTEROL SULFATE) 2 puffs qid as needed VERAMYST 27.5 MCG/SPRAY SUSP (FLUTICASONE FUROATE) 2 sprays each nostril once daily ALLEGRA 180 MG TABS (FEXOFENADINE HCL) 1 by mouth once daily as needed   Current Allergies (reviewed today): ! VIRAMUNE (NEVIRAPINE) Social History: Sexual History:  n/a Drug Use:  never  Additional History Menstrual Status:  postmenopausal  Vital Signs:  Patient profile:   55 year old female Menstrual status:  postmenopausal Height:      64 inches (162.56 cm) Weight:      186.0 pounds (84.55 kg) BMI:     32.04 Temp:     98.281 degrees F (36.82 degrees C) oral BP sitting:   147 / 92  (left arm) Cuff size:   large  Vitals Entered By: Jennet Maduro RN (September 25, 2010 10:45 AM) CC: follow-up visit Is Patient Diabetic? No Pain Assessment Patient in pain? no      Nutritional Status BMI of 25 - 29 = overweight Nutritional Status Detail appetite "just fine"  Have you ever been in a relationship where you felt threatened, hurt or  afraid?No   Does patient need assistance? Functional Status Self care Ambulation Normal Comments has been off medication since August.  Had been taking Kaletra and Truvada.  Off and on these rxes due to Side Effects.     Menstrual Status postmenopausal Last PAP Result Negative   Physical Exam  General:  alert and overweight-appearing.  she has gained weight. Mouth:  Oral mucosa and oropharynx without lesions or exudates.  Teeth in good repair. Lungs:  Normal respiratory effort, chest expands symmetrically. Lungs are clear to auscultation, no crackles or wheezes. Heart:  normal rate and no murmur.  regular rhythm.          Medication Adherence: 09/25/2010   Adherence to medications reviewed with patient. Counseling to provide adequate adherence provided                                Impression & Recommendations:  Problem # 1:  HIV  DISEASE (ICD-042) unfortunately, her medication appears to work when she is taking it.  However, cycling on and off antiretrovirals increases the risk of her virus developing resistance and also of progression of her HIV infection.  She is willing to consider another regimen that she might tolerate better.  I will try her on Truvada, Reyataz and Norvir. Diagnostics Reviewed:  CD4: 330 (07/24/2010)   WBC: 3.9 (07/23/2010)   Hgb: 12.0 (07/23/2010)   HCT: 36.1 (07/23/2010)   Platelets: 196 (07/23/2010) HIV-1 RNA: <20 copies/mL (07/23/2010)   HBSAg: No (12/29/2006)  Medications Added to Medication List This Visit: 1)  Truvada 200-300 Mg Tabs (Emtricitabine-tenofovir) .... Take 1 tablet by mouth once a day 2)  Reyataz 300 Mg Caps (Atazanavir sulfate) .... Take 1 capsule by mouth once a day 3)  Norvir 100 Mg Tabs (Ritonavir) .... Take 1 tablet by mouth once a day  Other Orders: Pneumococcal Vaccine ([address]) Admin 1st Vaccine ([address]) Admin 1st Vaccine [facility]) [phone]) Est. Patient Level III (24401) Future Orders: T-CD4SP ([facility]) (CD4SP) ... 11/06/2010 T-HIV Viral Load [phone]) ... 11/06/2010 T-Comprehensive Metabolic Panel [phone]) ... 11/06/2010 T-CBC w/Diff ([id number]) ... 11/06/2010 T-RPR (Syphilis) [phone]) ... 11/06/2010 T-Lipid Profile [phone]) ... 11/06/2010  Patient Instructions: 1)  Please schedule a follow-up appointment in 2 months.  Prescriptions: NORVIR 100 MG TABS (RITONAVIR) Take 1 tablet by mouth once a day  #30 x 11   Entered and Authorized by:   Cliffton Asters MD   Signed by:   Cliffton Asters MD on 09/25/2010   Method used:   Print then Give to Patient   RxID:   [id number] REYATAZ 300 MG CAPS (ATAZANAVIR SULFATE) Take 1 capsule by mouth once a day  #30 x 11   Entered and Authorized by:   Cliffton Asters MD   Signed by:   Cliffton Asters MD on 09/25/2010   Method used:   Print then Give to Patient   RxID:   [id number] TRUVADA 200-300 MG  TABS (EMTRICITABINE-TENOFOVIR) Take 1 tablet by mouth once a day  #30 x 11   Entered and Authorized by:   Cliffton Asters MD   Signed by:   Cliffton Asters MD on 09/25/2010   Method used:   Print then Give to Patient   RxID:   [id number]    Influenza Immunization History:    Influenza # 1:  Historical (07/27/2010)  Pneumovax Vaccine    Vaccine Type: Pneumovax    Site: left deltoid    Mfr: Merck  Dose: 0.5 ml    Route: IM    Given by: Jennet Maduro RN    Exp. Date: 03/05/2012    Lot #: [id number]

## 2010-12-06 NOTE — Letter (Signed)
Summary: Out of Work  [facility] [employer]  [address] [place], [place] [address]   Phone: [phone]  Fax: [phone]    April 17, 2010   Employee:  BEULA JOYNER    To Whom It May Concern:   For Medical reasons, please excuse the above named employee from work for the following dates:  Start:   April 16, 2010  End:   April 17, 2010  If you need additional information, please feel free to contact our office.         Sincerely,    Loreen Freud, DO

## 2010-12-06 NOTE — Letter (Signed)
Summary: initial OV-09/25/1995  initial OV-09/25/1995   Imported By: Dorice Lamas 01/02/2007 17:11:29  _____________________________________________________________________  External Attachment:    Type:   Image     Comment:   External Document

## 2010-12-06 NOTE — Letter (Signed)
Summary: Results Follow-up Letter  [facility]  [address]   [place], [place] [address]   Phone: [phone]  Fax: [phone]          May 10, 2008  [address] [place], [place]  [address]  Dear Ms. Goetting,   The following are the results of your recent test(s):  Test     Result     Pap Smear    Normal_XXX___  Not Normal_____        Comments:  Thank you for coming in to the PAP clinic.  I will see you in one year.  Sincerely,     Jennet Maduro RN [facility]

## 2010-12-06 NOTE — Consult Note (Signed)
[facility]   Imported By: Lennie Odor 08/13/2010 15:07:46  _____________________________________________________________________  External Attachment:    Type:   Image     Comment:   External Document

## 2010-12-06 NOTE — Assessment & Plan Note (Signed)
Summary: congested/cbs   Vital Signs:  Patient profile:   55 year old female Weight:      179 pounds Temp:     98.4 degrees F oral Pulse rate:   78 / minute Pulse rhythm:   regular BP sitting:   138 / 84  (left arm) Cuff size:   large  Vitals Entered By: Army Fossa CMA (April 17, 2010 11:15 AM) CC: Pt here for head congestion, lots of sinus pressure x 3 days. , URI symptoms   History of Present Illness:       This is a 55 year old woman who presents with URI symptoms.  The symptoms began 5 days ago.  Pt tried otc med with not relief.  The patient complains of nasal congestion, purulent nasal discharge, productive cough, earache, and sick contacts.  Associated symptoms include fever of 100.5-103 degrees.  The patient also reports headache.  The patient denies itchy watery eyes, itchy throat, sneezing, seasonal symptoms, response to antihistamine, muscle aches, and severe fatigue.  The patient denies the following risk factors for Strep sinusitis: unilateral facial pain, unilateral nasal discharge, poor response to decongestant, double sickening, tooth pain, Strep exposure, tender adenopathy, and absence of cough.    Allergies: 1)  ! Viramune (Nevirapine)  Past History:  Past medical, surgical, family and social histories (including risk factors) reviewed for relevance to current acute and chronic problems.  Past Medical History: Reviewed history from 03/02/2009 and no changes required. Allergic rhinitis Anxiety Depression HIV disease Bell's palsy Genital herpes Syphillis Drug abuse- hx Abnormal PAB smear  Past Surgical History: Reviewed history from 03/02/2009 and no changes required. csection 1997  Family History: Reviewed history from 03/02/2009 and no changes required. father- DM no cancers, MI, CAD, HTN, hyperlipidemia  Social History: Reviewed history from 03/02/2009 and no changes required. lives w/ daughter 19, 2 dogs. 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  Otoscopic examination reveals clear canals, tympanic membranes are intact bilaterally without bulging, retraction, inflammation or discharge. Hearing is grossly normal bilaterally. Nose:  L frontal sinus tenderness, L maxillary sinus tenderness, R frontal sinus tenderness, and R maxillary sinus tenderness.   Mouth:  Oral mucosa and oropharynx without lesions or exudates.  Teeth in good repair. Neck:  No deformities, masses, or tenderness noted. Lungs:  Normal respiratory effort, chest expands symmetrically. Lungs are clear to auscultation, no crackles or wheezes. Heart:  normal rate and no murmur.   Skin:  Intact without suspicious lesions or rashes Psych:  Cognition and judgment appear intact. Alert and cooperative with normal attention span and concentration. No apparent delusions, illusions, hallucinations   Impression & Recommendations:  Problem # 1:  SINUSITIS - ACUTE-NOS (ICD-461.9)  The following medications were removed from the medication list:    Azithromycin 500 Mg Tabs (Azithromycin) .[name]... 1 tab daily x3 days Her updated medication list for this problem includes:    Augmentin 875-125 Mg Tabs (Amoxicillin-pot clavulanate) .[name]... 1 by mouth two times a day    Veramyst 27.5 Mcg/spray Susp (Fluticasone furoate) .[name]... 2 sprays each nostril once daily   use mucinex for cough and otc antihistamine   Instructed on treatment. Call if symptoms persist or worsen.   Complete Medication List: 1)  Proair Hfa 108 (90 Base) Mcg/act Aers (Albuterol sulfate) .Marland KitchenMarland KitchenMarland Kitchen  2 puffs qid as needed 2)  Augmentin 875-125 Mg Tabs (Amoxicillin-pot clavulanate) .[name].. 1 by mouth two times a day 3)  Veramyst 27.5 Mcg/spray Susp (Fluticasone furoate) .... 2 sprays each  nostril once daily 4)  Allegra 180 Mg Tabs (Fexofenadine hcl) .[name].. 1 by mouth once daily as needed  Patient Instructions: 1)  Acute sinusitis symptoms for less than 10 days are not helped by antibiotics. Use warm moist compresses, and over the counter decongestants( only as directed). Call if no improvement in 5-7 days, sooner if increasing pain, fever, or new symptoms.  Prescriptions: AUGMENTIN 875-125 MG TABS (AMOXICILLIN-POT CLAVULANATE) 1 by mouth two times a day  #20 x 0   Entered and Authorized by:   Loreen Freud DO   Signed by:   Loreen Freud DO on 04/17/2010   Method used:   Electronically to        [facility] [phone][facility] (retail)       [address]       [place], [place]  [address]       Ph: [phone]       Fax: [phone]   RxID:   [id number]

## 2010-12-06 NOTE — Progress Notes (Signed)
Summary: Patient requesting appt/no better  Phone Note Call from Patient Call back at [phone]   Caller: Patient Call For: Neena Rhymes MD Summary of Call: Patient left voicemail stating she needs to see Dr. Beverely Low because she is not feeling any better after taking the medication Dr. Laury Axon prescribed. Initial call taken by: Barnie Mort,  April 18, 2010 3:00 PM Call placed by: Barnie Mort,  April 18, 2010 4:21 PM Call placed to: Patient Summary of Call: Patient left a message stating she is no better after seeing Dr. Laury Axon and taking the medication prescribed.  She is still experiencing chills, fever, swollen face, stuffy nose and head, headaches and facial pain.  Patient requested to see Dr. Beverely Low in [place] if she can get a ride.  I advised the patient to give me a call if she can get a ride to [place].  Initial call taken by: Barnie Mort,  April 18, 2010 4:24 PM  Follow-up for Phone Call        pt called back coming in tomorrow to see dr Beverely Low.Marland KitchenMarland KitchenFelecia Deloach CMA  April 18, 2010 5:16 PM   Additional Follow-up for Phone Call Additional follow up Details #1::        noted. Additional Follow-up by: Neena Rhymes MD,  April 19, 2010 8:10 AM     Appended Document: Patient requesting appt/no better please call pt and let her know that only taking 1-2 days of abx is not sufficient time for her sxs to improve. the medication that Dr Laury Axon gave her is very strong and will do an excellent job of treating her sxs.  there is not much for me to do here in [place] and she may not appreciate the drive for me to tell her i agree w/ what Dr Laury Axon did 2 days ago.  Appended Document: Patient requesting appt/no better see phone note

## 2010-12-06 NOTE — Letter (Signed)
Summary: Out of Work  [facility]  [address] [place], [place] [address]   Phone: [phone]  Fax: [phone]    November 24, 2009   Employee:  ESMAE DONATHAN    To Whom It May Concern:   For Medical reasons, please excuse the above named employee from work for the following dates:  Start:   11/22/09  End:   11/24/09  If you need additional information, please feel free to contact our office.         Sincerely,    Janit Bern

## 2010-12-06 NOTE — Progress Notes (Signed)
Summary: Pt. needs PAP smear appt.  Phone Note Outgoing Call   Call placed by: Jennet Maduro RN,  November 16, 2010 2:47 PM Call placed to: Patient Summary of Call: Pt. needs to make PAP smear appt. Jennet Maduro RN  November 16, 2010 2:47 PM

## 2010-12-06 NOTE — Progress Notes (Signed)
Summary: request for ENT referral  Phone Note Call from Patient   Caller: Patient Summary of Call: Message left on Chemira's VM: patient would like referral to ENT for ongoing ear concerns, ears still feel clogged.   Dr.Tabori I placed order for referral to ENT, Please advise if you agree or if you would recommend another routh prior to ENT refrerral. Please send your respond to [facility] Triage Nurse Desktop./[facility]  April 24, 2010 4:22 PM   Follow-up for Phone Call        ok w/ ENT referral.  thank you. Follow-up by: Neena Rhymes MD,  April 25, 2010 8:11 AM

## 2010-12-06 NOTE — Assessment & Plan Note (Signed)
Summary: FLU Lennon Alstrom Apolonio Schneiders   Vital Signs:  Patient profile:   55 year old female Weight:      173 pounds Temp:     98.3 degrees F oral Pulse rate:   76 / minute Pulse rhythm:   regular BP sitting:   130 / 80  (left arm) Cuff size:   large  Vitals Entered By: Army Fossa CMA (November 24, 2009 4:04 PM) CC: Pt c/o fever x 2 days, no fever today, headache, and chest congestion. , Cough   History of Present Illness:  Cough      This is a 55 year old woman who presents with Cough.  The symptoms began 2 days ago.  The patient reports productive cough, shortness of breath, wheezing, and fever.  Associated symtpoms include sore throat and nasal congestion.  The cough is worse with activity and lying down.  Ineffective prior treatments have included OTC cough medication.  Pt with hx asthma as child.  Current Medications (verified): 1)  Prednisone 10 Mg Tabs (Prednisone) .... 3 By Mouth Once Daily For 3 Days Then 2 By Mouth Once Daily For 3 Days Then 1 By Mouth Once Daily For 3 Days 2)  Zithromax Z-Pak 250 Mg Tabs (Azithromycin) .... As Directed 3)  Symbicort 160-4.5 Mcg/act Aero (Budesonide-Formoterol Fumarate) .... 2 Puffs Two Times A Day 4)  Proair Hfa 108 (90 Base) Mcg/act Aers (Albuterol Sulfate) .... 2 Puffs Qid As Needed  Allergies: 1)  ! Viramune (Nevirapine)  Past History:  Past medical, surgical, family and social histories (including risk factors) reviewed for relevance to current acute and chronic problems.  Past Medical History: Reviewed history from 03/02/2009 and no changes required. Allergic rhinitis Anxiety Depression HIV disease Bell's palsy Genital herpes Syphillis Drug abuse- hx Abnormal PAB smear  Past Surgical History: Reviewed history from 03/02/2009 and no changes required. csection 1997  Family History: Reviewed history from 03/02/2009 and no changes required. father- DM no cancers, MI, CAD, HTN, hyperlipidemia  Social History: Reviewed history  from 03/02/2009 and no changes required. lives w/ daughter 42, 2 dogs. 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  Otoscopic examination reveals clear canals, tympanic membranes are intact bilaterally without bulging, retraction, inflammation or discharge. Hearing is grossly normal bilaterally. Nose:  External nasal examination shows no deformity or inflammation. Nasal mucosa are pink and moist without lesions or exudates. Mouth:  Oral mucosa and oropharynx without lesions or exudates.  Teeth in good repair. Neck:  No deformities, masses, or tenderness noted. Lungs:  R wheezes and L wheezes.   improved after neb Heart:  normal rate and no murmur.   Cervical Nodes:  No lymphadenopathy noted Psych:  Cognition and judgment appear intact. Alert and cooperative with normal attention span and concentration. No apparent delusions, illusions, hallucinations   Impression & Recommendations:  Problem # 1:  BRONCHITIS- ACUTE (ICD-466.0)  The following medications were removed from the medication list:    Dulera 100-5 Mcg/act Aero (Mometasone furo-formoterol fum) .[name]... 2 puffs two times a day Her updated medication list for this problem includes:    Zithromax Z-pak 250 Mg Tabs (Azithromycin) .[name]... As directed    Symbicort 160-4.5 Mcg/act Aero (Budesonide-formoterol fumarate) .[name]... 2 puffs two times a day    Proair Hfa 108 (90 Base) Mcg/act Aers (Albuterol  sulfate) .[name]... 2 puffs qid as needed  Take antibiotics and other medications as directed. Encouraged to push clear liquids, get enough rest, and take acetaminophen as needed. To be seen in 5-7 days if no improvement, sooner if worse.  Problem # 2:  ASTHMA UNSPECIFIED WITH EXACERBATION [phone])  The following  medications were removed from the medication list:    Dulera 100-5 Mcg/act Aero (Mometasone furo-formoterol fum) .[name]... 2 puffs two times a day Her updated medication list for this problem includes:    Prednisone 10 Mg Tabs (Prednisone) .Marland KitchenMarland KitchenMarland KitchenMarland Kitchen 3 by mouth once daily for 3 days then 2 by mouth once daily for 3 days then 1 by mouth once daily for 3 days    Symbicort 160-4.5 Mcg/act Aero (Budesonide-formoterol fumarate) .[name]... 2 puffs two times a day    Proair Hfa 108 (90 Base) Mcg/act Aers (Albuterol sulfate) .[name]... 2 puffs qid as needed  Orders: Admin of Therapeutic Inj  intramuscular or subcutaneous (19147) Depo- Medrol 80mg  (J1040) Albuterol Sulfate Sol 1mg  unit dose (W2956) Nebulizer Tx (21308)  Complete Medication List: 1)  Prednisone 10 Mg Tabs (Prednisone) .... 3 by mouth once daily for 3 days then 2 by mouth once daily for 3 days then 1 by mouth once daily for 3 days 2)  Zithromax Z-pak 250 Mg Tabs (Azithromycin) .... As directed 3)  Symbicort 160-4.5 Mcg/act Aero (Budesonide-formoterol fumarate) .... 2 puffs two times a day 4)  Proair Hfa 108 (90 Base) Mcg/act Aers (Albuterol sulfate) .... 2 puffs qid as needed Prescriptions: ZITHROMAX Z-PAK 250 MG TABS (AZITHROMYCIN) AS DIRECTED  #1 x 0   Entered and Authorized by:   Loreen Freud DO   Signed by:   Loreen Freud DO on 11/24/2009   Method used:   [facility]        [facility] [phone]* (retail)       [address]       [place], [place]  [address]       Ph: [phone]       Fax: [phone]   RxID:   [id number] PREDNISONE 10 MG TABS (PREDNISONE) 3 by mouth once daily FOR 3 DAYS THEN 2 by mouth once daily FOR 3 DAYS THEN 1 by mouth once daily FOR 3 DAYS  #18 x 0   Entered and Authorized by:   Loreen Freud DO   Signed by:   Loreen Freud DO on 11/24/2009   Method used:   [facility]ically to        [facility] [phone]* (retail)       [address]       [place], [place]  [address]       Ph: [phone]       Fax: [phone]   RxID:   [id number]    Medication Administration  Injection # 1:    Medication: Depo- Medrol 80mg     Diagnosis: ASTHMA UNSPECIFIED WITH EXACERBATION [phone])    Route: IM    Site: LUOQ gluteus    Exp Date: 08/2010    Lot #: [id number]    Mfr: novaplus    Patient tolerated injection without complications    Given by: Army Fossa CMA (November 24, 2009 4:44 PM)  Medication # 1:    Medication: Albuterol Sulfate Sol 1mg  unit dose    Diagnosis: ASTHMA UNSPECIFIED WITH EXACERBATION (ICD-493.92)  Orders Added: 1)  Admin of Therapeutic Inj  intramuscular or subcutaneous [96372] 2)  Depo- Medrol 80mg  [J1040] 3)  Albuterol Sulfate Sol 1mg  unit dose [J7613] 4)  Nebulizer Tx [94640] 5)  Est. Patient Level IV [[address]]

## 2010-12-06 NOTE — Miscellaneous (Signed)
Summary: Orders Update - labs  Clinical Lists Changes  Orders: Added new Test order of T-CBC w/Diff [phone]) - Signed Added new Test order of T-CD4SP [facility]) (CD4SP) - Signed Added new Test order of T-Comprehensive Metabolic Panel [phone]) - Signed Added new Test order of T-HIV Viral Load [phone]) - Signed Added new Test order of T-RPR (Syphilis) [id number]) - Signed     Process Orders Check Orders Results:     [facility]: ABN not required for this insurance Order queued for requisitioning for [facility]: July 20, 2010 2:47 PM  Tests Sent for requisitioning (July 20, 2010 2:47 PM):     07/23/2010: [facility] -- T-CBC w/Diff [[id number]] (signed)     07/23/2010: [facility] -- T-Comprehensive Metabolic Panel [[id number]] (signed)     07/23/2010: [facility] -- T-HIV Viral Load [phone] (signed)     07/23/2010: [facility] -- T-RPR (Syphilis) [phone] (signed)

## 2010-12-06 NOTE — Assessment & Plan Note (Signed)
Summary: congestion/headache/fever/kdc   Vital Signs:  Patient profile:   55 year old female Weight:      178 pounds BMI:     30.66 Temp:     98.4 degrees F oral BP sitting:   130 / 80  (left arm)  Vitals Entered By: Doristine Devoid (February 22, 2010 10:06 AM) CC: sinus pressure along w/ ears stopped and some dizziness    History of Present Illness: 55 yo woman here today for sinus pressure, ear congestion, and dizziness w/ rapid movement.  + fatigue, fevers, chills, anorexia.  some difficulty breathing.  no relief w/ tylenol.  sxs started 4 days ago.  initially thought it was allergies but now feels it's something else.  Current Medications (verified): 1)  Symbicort 160-4.5 Mcg/act Aero (Budesonide-Formoterol Fumarate) .... 2 Puffs Two Times A Day 2)  Proair Hfa 108 (90 Base) Mcg/act Aers (Albuterol Sulfate) .... 2 Puffs Qid As Needed 3)  Azithromycin 500 Mg Tabs (Azithromycin) .[name].. 1 Tab Daily X3 Days  Allergies (verified): 1)  ! Viramune (Nevirapine)  Review of Systems      See HPI  Physical Exam  General:  Well-developed,well-nourished,in no acute distress; alert,appropriate and cooperative throughout examination Head:  NCAT, +TTP over maxillary sinuses Eyes:  no injxn or inflammation Ears:  TMs retracted bilaterally Nose:  + turbinate edema Mouth:  + PND, no tonsilar erythema, edema, exudate Neck:  No deformities, masses, or tenderness noted. Lungs:  Normal respiratory effort, chest expands symmetrically. Lungs are clear to auscultation, no crackles or wheezes. Heart:  normal rate and no murmur.     Impression & Recommendations:  Problem # 1:  SINUSITIS - ACUTE-NOS (ICD-461.9) Assessment Unchanged pt's sxs consistent w/ sinusitis.  at pt's request tx w/ short course azithro.  reviewed supportive care and red flags that should prompt return.  Pt expresses understanding and is in agreement w/ this plan. Her updated medication list for this problem includes:  Azithromycin 500 Mg Tabs (Azithromycin) .[name]... 1 tab daily x3 days  Complete Medication List: 1)  Symbicort 160-4.5 Mcg/act Aero (Budesonide-formoterol fumarate) .... 2 puffs two times a day 2)  Proair Hfa 108 (90 Base) Mcg/act Aers (Albuterol sulfate) .... 2 puffs qid as needed 3)  Azithromycin 500 Mg Tabs (Azithromycin) .[name].. 1 tab daily x3 days  Patient Instructions: 1)  This is a sinus infection 2)  Take the Azithromycin as directed 3)  Start a decongestant- sudafed, Claritin D, etc- to help with the fullness in your ears 4)  Drink plenty of fluids 5)  Call with any questions or concerns 6)  Hang in there! 7)  Happy Odis Luster! Prescriptions: AZITHROMYCIN 500 MG TABS (AZITHROMYCIN) 1 tab daily x3 days  #3 x 0   Entered and Authorized by:   Neena Rhymes MD   Signed by:   Neena Rhymes MD on 02/22/2010   Method used:   [facility]        [facility] [phone][facility] (retail)       [address]       [place], [place]  [address]       Ph: [phone]       Fax: [phone]   RxID:   [id number]

## 2010-12-06 NOTE — Progress Notes (Signed)
  Phone Note Outgoing Call   Call placed by: Doristine Devoid,  April 19, 2010 10:14 AM Call placed to: Patient Summary of Call: please call pt and let her know that only taking 1-2 days of abx is not sufficient time for her sxs to improve. the medication that Dr Laury Axon gave her is very strong and will do an excellent job of treating her sxs.  there is not much for me to do here in [place] and she may not appreciate the drive for me to tell her i agree w/ what Dr Laury Axon did 2 days ago.  Follow-up for Phone Call        spoke w/ patient informed of instructions and also recommended using netti pot to help relieve congestion patient very congested and running fever but has been taking tylenol and meds as directed and will give medication more time .....Marland KitchenMarland KitchenDoristine Devoid  April 19, 2010 10:19 AM

## 2011-01-17 LAB — T-HELPER CELL (CD4) - (RCID CLINIC ONLY)
CD4 % Helper T Cell: 16 % — ABNORMAL LOW (ref 33–55)
CD4 T Cell Abs: 330 uL — ABNORMAL LOW (ref 400–2700)

## 2011-02-15 ENCOUNTER — Encounter: Payer: Self-pay | Admitting: Family Medicine

## 2011-02-15 ENCOUNTER — Telehealth: Payer: Self-pay | Admitting: Family Medicine

## 2011-02-15 NOTE — Telephone Encounter (Signed)
Tylenol, ibuprofen or aleve

## 2011-02-15 NOTE — Telephone Encounter (Signed)
Pt aware.

## 2011-02-15 NOTE — Telephone Encounter (Signed)
Patient has appt to see Dr Beverely Low on Tues 4/17 for shoulder pain---says it is extremely painful and feels like a pinch---what can she take from over-the-counter products??    OK to leave message on her cell phone

## 2011-02-15 NOTE — Telephone Encounter (Signed)
Please advise 

## 2011-02-19 ENCOUNTER — Encounter: Payer: Self-pay | Admitting: Family Medicine

## 2011-02-19 ENCOUNTER — Ambulatory Visit (INDEPENDENT_AMBULATORY_CARE_PROVIDER_SITE_OTHER): Payer: BLUE CROSS/BLUE SHIELD | Admitting: Family Medicine

## 2011-02-19 VITALS — BP 140/90 | Temp 98.6°F | Wt 188.2 lb

## 2011-02-19 DIAGNOSIS — M25519 Pain in unspecified shoulder: Secondary | ICD-10-CM

## 2011-02-19 MED ORDER — NAPROXEN 500 MG PO TABS: 500.0000 mg | ORAL_TABLET | Freq: Two times a day (BID) | ORAL | Status: AC

## 2011-02-19 MED ORDER — CYCLOBENZAPRINE HCL 10 MG PO TABS: 10.0000 mg | ORAL_TABLET | Freq: Three times a day (TID) | ORAL | Status: AC | PRN

## 2011-02-19 NOTE — Progress Notes (Signed)
  Subjective:    Patient ID: Stephanie Espinoza, female    DOB: 11-19-1955, 55 y.o.   MRN: [id number]  HPI L shoulder pain- sxs started 30-40 days ago.  having difficulty getting comfortable when sleeping.  'constantly feels like a pinched nerve'.  Will have to pump fist 'to get it to feel better'.  7-8 yrs ago 'messed up rotator cuff'.  Cannot put purse on that side.  No recent heavy lifting or injury to that side.  Has taken Aleve w/ some mild reduction of sxs but 'it comes right back'.  Denies weakness or numbness of hand or arm.   Review of Systems For ROS see HPI     Objective:   Physical Exam  Constitutional: She appears well-developed and well-nourished. No distress.  Neck: Normal range of motion. Neck supple.  Musculoskeletal:       + L trap spasm Full abduction, forward flexion of L shoulder + pain w/ internal/external rotation Negative impingement signs          Assessment & Plan:

## 2011-02-19 NOTE — Assessment & Plan Note (Signed)
Pt's pain is likely multifactorial- trap spasm, rotator cuff strain.  Start muscle relaxer, continue NSAIDs,  Refer to PT.  If no improvement will refer to ortho.  Pt expressed understanding and is in agreement w/ plan.

## 2011-02-19 NOTE — Patient Instructions (Signed)
This appears to be a muscle spasm problem Take the Flexeril at night so you can relax comfortable Take the Naprosyn twice daily w/ food for the next 10 days and then as needed We'll call you with your physical therapy appt [facility] for pain relief Hang in there!!!

## 2011-03-06 ENCOUNTER — Ambulatory Visit: Payer: BLUE CROSS/BLUE SHIELD | Attending: Family Medicine | Admitting: Physical Therapy

## 2011-03-06 DIAGNOSIS — M25519 Pain in unspecified shoulder: Secondary | ICD-10-CM | POA: Insufficient documentation

## 2011-03-06 DIAGNOSIS — IMO0001 Reserved for inherently not codable concepts without codable children: Secondary | ICD-10-CM | POA: Insufficient documentation

## 2011-03-06 DIAGNOSIS — R293 Abnormal posture: Secondary | ICD-10-CM | POA: Insufficient documentation

## 2011-03-06 DIAGNOSIS — M542 Cervicalgia: Secondary | ICD-10-CM | POA: Insufficient documentation

## 2011-03-13 ENCOUNTER — Encounter: Payer: BLUE CROSS/BLUE SHIELD | Admitting: Physical Therapy

## 2011-03-20 ENCOUNTER — Encounter: Payer: BLUE CROSS/BLUE SHIELD | Admitting: Physical Therapy

## 2011-04-03 ENCOUNTER — Encounter: Payer: BLUE CROSS/BLUE SHIELD | Admitting: Physical Therapy

## 2011-07-29 LAB — T-HELPER CELL (CD4) - (RCID CLINIC ONLY)
CD4 % Helper T Cell: 18 — ABNORMAL LOW
CD4 T Cell Abs: 430

## 2011-08-02 LAB — CULTURE, ROUTINE-ABSCESS
Culture: NO GROWTH
Gram Stain: NONE SEEN

## 2011-08-14 LAB — T-HELPER CELL (CD4) - (RCID CLINIC ONLY)
CD4 % Helper T Cell: 24 — ABNORMAL LOW
CD4 T Cell Abs: 420

## 2011-08-16 LAB — T-HELPER CELL (CD4) - (RCID CLINIC ONLY)
CD4 % Helper T Cell: 23 — ABNORMAL LOW
CD4 T Cell Abs: 400

## 2011-09-24 ENCOUNTER — Telehealth: Payer: Self-pay | Admitting: *Deleted

## 2011-09-24 NOTE — Telephone Encounter (Signed)
I called back & left her message that she needs an appt with Dr. Orvan Falconer as well

## 2011-09-24 NOTE — Telephone Encounter (Signed)
I called her to set up her overdue pap. transferred to [facility] in front office

## 2011-10-21 ENCOUNTER — Other Ambulatory Visit: Payer: Self-pay | Admitting: Internal Medicine

## 2011-10-21 DIAGNOSIS — B20 Human immunodeficiency virus [HIV] disease: Secondary | ICD-10-CM

## 2011-10-21 DIAGNOSIS — Z113 Encounter for screening for infections with a predominantly sexual mode of transmission: Secondary | ICD-10-CM

## 2011-10-21 DIAGNOSIS — Z79899 Other long term (current) drug therapy: Secondary | ICD-10-CM

## 2011-10-22 ENCOUNTER — Other Ambulatory Visit: Payer: BC Managed Care – PPO

## 2011-10-24 ENCOUNTER — Other Ambulatory Visit: Payer: Self-pay | Admitting: Internal Medicine

## 2011-10-24 ENCOUNTER — Other Ambulatory Visit: Payer: BC Managed Care – PPO

## 2011-10-24 DIAGNOSIS — Z113 Encounter for screening for infections with a predominantly sexual mode of transmission: Secondary | ICD-10-CM

## 2011-10-24 DIAGNOSIS — Z79899 Other long term (current) drug therapy: Secondary | ICD-10-CM

## 2011-10-24 DIAGNOSIS — B20 Human immunodeficiency virus [HIV] disease: Secondary | ICD-10-CM

## 2011-10-24 LAB — CBC WITH DIFFERENTIAL/PLATELET
Basophils Absolute: 0 10*3/uL (ref 0.0–0.1)
Basophils Relative: 1 % (ref 0–1)
Eosinophils Absolute: 0.1 10*3/uL (ref 0.0–0.7)
Eosinophils Relative: 2 % (ref 0–5)
HCT: 39.7 % (ref 36.0–46.0)
Hemoglobin: 12.7 g/dL (ref 12.0–15.0)
Lymphocytes Relative: 67 % — ABNORMAL HIGH (ref 12–46)
Lymphs Abs: 2.7 10*3/uL (ref 0.7–4.0)
MCH: 26.9 pg (ref 26.0–34.0)
MCHC: 32 g/dL (ref 30.0–36.0)
MCV: 84.1 fL (ref 78.0–100.0)
Monocytes Absolute: 0.4 10*3/uL (ref 0.1–1.0)
Monocytes Relative: 10 % (ref 3–12)
Neutro Abs: 0.9 10*3/uL — ABNORMAL LOW (ref 1.7–7.7)
Neutrophils Relative %: 21 % — ABNORMAL LOW (ref 43–77)
Platelets: 203 10*3/uL (ref 150–400)
RBC: 4.72 MIL/uL (ref 3.87–5.11)
RDW: 13.9 % (ref 11.5–15.5)
WBC: 4.1 10*3/uL (ref 4.0–10.5)

## 2011-10-24 LAB — LIPID PANEL
Cholesterol: 177 mg/dL (ref 0–200)
HDL: 39 mg/dL — ABNORMAL LOW (ref >39)
LDL Cholesterol: 103 mg/dL — ABNORMAL HIGH (ref 0–99)
Total CHOL/HDL Ratio: 4.5 Ratio
Triglycerides: 177 mg/dL — ABNORMAL HIGH (ref <150)
VLDL: 35 mg/dL (ref 0–40)

## 2011-10-24 LAB — COMPREHENSIVE METABOLIC PANEL
ALT: 30 U/L (ref 0–35)
AST: 31 U/L (ref 0–37)
Albumin: 3.7 g/dL (ref 3.5–5.2)
Alkaline Phosphatase: 87 U/L (ref 39–117)
BUN: 7 mg/dL (ref 6–23)
CO2: 26 mEq/L (ref 19–32)
Calcium: 9.3 mg/dL (ref 8.4–10.5)
Chloride: 106 mEq/L (ref 96–112)
Creat: 0.91 mg/dL (ref 0.50–1.10)
Glucose, Bld: 95 mg/dL (ref 70–99)
Potassium: 3.9 mEq/L (ref 3.5–5.3)
Sodium: 140 mEq/L (ref 135–145)
Total Bilirubin: 0.7 mg/dL (ref 0.3–1.2)
Total Protein: 8 g/dL (ref 6.0–8.3)

## 2011-10-24 LAB — RPR

## 2011-10-25 LAB — T-HELPER CELL (CD4) - (RCID CLINIC ONLY)
CD4 % Helper T Cell: 13 % — ABNORMAL LOW (ref 33–55)
CD4 T Cell Abs: 370 uL — ABNORMAL LOW (ref 400–2700)

## 2011-10-25 LAB — PATHOLOGIST SMEAR REVIEW

## 2011-10-28 LAB — HIV-1 RNA QUANT-NO REFLEX-BLD
HIV 1 RNA Quant: 2620 copies/mL — ABNORMAL HIGH (ref <20)
HIV-1 RNA Quant, Log: 3.42 {Log} — ABNORMAL HIGH (ref <1.30)

## 2011-11-12 ENCOUNTER — Encounter: Payer: Self-pay | Admitting: *Deleted

## 2011-11-12 ENCOUNTER — Encounter: Payer: Self-pay | Admitting: Internal Medicine

## 2011-11-12 ENCOUNTER — Ambulatory Visit (INDEPENDENT_AMBULATORY_CARE_PROVIDER_SITE_OTHER): Payer: BC Managed Care – PPO | Admitting: Internal Medicine

## 2011-11-12 VITALS — BP 129/85 | HR 75 | Temp 98.0°F | Ht 62.0 in | Wt 178.8 lb

## 2011-11-12 DIAGNOSIS — B20 Human immunodeficiency virus [HIV] disease: Secondary | ICD-10-CM

## 2011-11-12 MED ORDER — RITONAVIR 100 MG PO CAPS: 100.0000 mg | ORAL_CAPSULE | Freq: Every day | ORAL | Status: DC

## 2011-11-12 MED ORDER — EMTRICITABINE-TENOFOVIR DF 200-300 MG PO TABS: 1.0000 | ORAL_TABLET | Freq: Every day | ORAL | Status: DC

## 2011-11-12 MED ORDER — ATAZANAVIR SULFATE 300 MG PO CAPS: 300.0000 mg | ORAL_CAPSULE | Freq: Every day | ORAL | Status: DC

## 2011-11-12 NOTE — Progress Notes (Signed)
Patient ID: Stephanie Espinoza, female   DOB: 08-May-1956, 56 y.o.   MRN: [id number]  INFECTIOUS DISEASE PROGRESS NOTE    Subjective: Stephanie Espinoza is in for her first visit since November of 2011. At that time we talked about changing her to a new regimen of Truvada, Reyataz and Norvir. Instead she decided to take another extended drug holiday and has not been on any of her antiretroviral medications since that time. She states that she needed a break. 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 Fortunately she has not had any new health problems or concerns  Objective:   General: She appears healthy and in good spirits Skin: No rash Lungs: Clear Cor: Regular S1 and S2 with no murmurs   Lab Results Lab Results  Component Value Date   WBC 4.1 10/24/2011   HGB 12.7 10/24/2011   HCT 39.7 10/24/2011   MCV 84.1 10/24/2011   PLT 203 10/24/2011    Lab Results  Component Value Date   CREATININE 0.91 10/24/2011   BUN 7 10/24/2011   NA 140 10/24/2011   K 3.9 10/24/2011   CL 106 10/24/2011   CO2 26 10/24/2011    Lab Results  Component Value Date   ALT 30 10/24/2011   AST 31 10/24/2011   ALKPHOS 87 10/24/2011   BILITOT 0.7 10/24/2011       Microbiology: No results found for this or any previous visit (from the past 240 hour(s)).  Studies/Results: No results found.   Assessment: Fortunately she is only had a small viral rebound since stopping antiretroviral therapy 15 months ago and her CD4 count is actually gone up from 330-370. Upon seeing her viral load is now 2620 she states that she does want to try her new regimen. I've asked her to call me immediately if she has any problems tolerating it.  Plan: 1. Start Truvada, Reyataz and Norvir. 2. Influenza vaccine today 3. Followup in 2 months after a falsetto blood work   Cliffton Asters, MD [facility] [phone] pager   [phone] cell 11/12/2011, 11:20  AM

## 2011-12-12 ENCOUNTER — Encounter: Payer: Self-pay | Admitting: Family Medicine

## 2011-12-12 ENCOUNTER — Ambulatory Visit (INDEPENDENT_AMBULATORY_CARE_PROVIDER_SITE_OTHER): Payer: BC Managed Care – PPO | Admitting: Family Medicine

## 2011-12-12 VITALS — BP 105/75 | HR 88 | Temp 98.9°F | Ht 64.0 in | Wt 180.0 lb

## 2011-12-12 DIAGNOSIS — N390 Urinary tract infection, site not specified: Secondary | ICD-10-CM

## 2011-12-12 DIAGNOSIS — R319 Hematuria, unspecified: Secondary | ICD-10-CM

## 2011-12-12 DIAGNOSIS — J019 Acute sinusitis, unspecified: Secondary | ICD-10-CM

## 2011-12-12 DIAGNOSIS — R350 Frequency of micturition: Secondary | ICD-10-CM

## 2011-12-12 LAB — POCT URINALYSIS DIPSTICK
Bilirubin, UA: NEGATIVE
Glucose, UA: NEGATIVE
Ketones, UA: NEGATIVE
Nitrite, UA: NEGATIVE
Urobilinogen, UA: 0.2
pH, UA: 6

## 2011-12-12 MED ORDER — CIPROFLOXACIN HCL 500 MG PO TABS: 500.0000 mg | ORAL_TABLET | Freq: Two times a day (BID) | ORAL | Status: AC

## 2011-12-12 NOTE — Assessment & Plan Note (Signed)
Pt's sxs and PE consistent w/ infxn.  Start abx.  Reviewed supportive care and red flags that should prompt return.  Pt expressed understanding and is in agreement w/ plan.  

## 2011-12-12 NOTE — Patient Instructions (Signed)
You have a sinus infection and a bladder infection Start the cipro twice daily (w/ food) Drink plenty of fluids Mucinex as needed to thin your congestion REST! Call with any questions or concerns Hang in there!

## 2011-12-12 NOTE — Assessment & Plan Note (Signed)
Pt's UA and sxs consistent w/ infxn.  Start abx to cover both sinuses and urine.  Reviewed supportive care and red flags that should prompt return.  Pt expressed understanding and is in agreement w/ plan.

## 2011-12-12 NOTE — Progress Notes (Signed)
  Subjective:    Patient ID: Stephanie Espinoza, female    DOB: 11-13-1955, 56 y.o.   MRN: [id number]  HPI URI- nasal congestion, sore throat.  L ear pain this AM.  Minimal cough.  + sneezing.  + sinus pressure.  No fevers.  + sick contacts.  No N/V/D.  Eyes feel 'heavy'.  No dizziness.  Urinary frequency- + urgency, no dysuria, no blood.  No abd pain, back pain.  sxs also started yesterday.     Review of Systems For ROS see HPI     Objective:   Physical Exam  Vitals reviewed. Constitutional: She appears well-developed and well-nourished. No distress.  HENT:  Head: Normocephalic and atraumatic.  Right Ear: Tympanic membrane normal.  Left Ear: Tympanic membrane normal.  Nose: Mucosal edema and rhinorrhea present. Right sinus exhibits maxillary sinus tenderness and frontal sinus tenderness. Left sinus exhibits maxillary sinus tenderness and frontal sinus tenderness.  Mouth/Throat: Uvula is midline and mucous membranes are normal. Posterior oropharyngeal erythema present. No oropharyngeal exudate.  Eyes: Conjunctivae and EOM are normal. Pupils are equal, round, and reactive to light.  Neck: Normal range of motion. Neck supple.  Cardiovascular: Normal rate, regular rhythm and normal heart sounds.   Pulmonary/Chest: Effort normal and breath sounds normal. No respiratory distress. She has no wheezes.  Abdominal: Soft. She exhibits no distension. There is no tenderness (no suprapubic or CVA tenderness).  Lymphadenopathy:    She has no cervical adenopathy.          Assessment & Plan:

## 2011-12-14 LAB — URINE CULTURE: Colony Count: >=100000

## 2011-12-24 ENCOUNTER — Encounter: Payer: Self-pay | Admitting: *Deleted

## 2011-12-25 ENCOUNTER — Encounter: Payer: Self-pay | Admitting: Family Medicine

## 2011-12-25 ENCOUNTER — Ambulatory Visit (INDEPENDENT_AMBULATORY_CARE_PROVIDER_SITE_OTHER): Payer: BC Managed Care – PPO | Admitting: Family Medicine

## 2011-12-25 DIAGNOSIS — R509 Fever, unspecified: Secondary | ICD-10-CM

## 2011-12-25 DIAGNOSIS — G43909 Migraine, unspecified, not intractable, without status migrainosus: Secondary | ICD-10-CM | POA: Insufficient documentation

## 2011-12-25 LAB — CBC WITH DIFFERENTIAL/PLATELET
Basophils Absolute: 0 10*3/uL (ref 0.0–0.1)
Basophils Relative: 0.2 % (ref 0.0–3.0)
Eosinophils Absolute: 0.1 10*3/uL (ref 0.0–0.7)
Eosinophils Relative: 0.7 % (ref 0.0–5.0)
HCT: 38.2 % (ref 36.0–46.0)
Hemoglobin: 12.7 g/dL (ref 12.0–15.0)
Lymphocytes Relative: 35.3 % (ref 12.0–46.0)
Lymphs Abs: 2.6 10*3/uL (ref 0.7–4.0)
MCHC: 33.2 g/dL (ref 30.0–36.0)
MCV: 83.7 fl (ref 78.0–100.0)
Monocytes Absolute: 0.4 10*3/uL (ref 0.1–1.0)
Monocytes Relative: 5.6 % (ref 3.0–12.0)
Neutro Abs: 4.3 10*3/uL (ref 1.4–7.7)
Neutrophils Relative %: 58.2 % (ref 43.0–77.0)
Platelets: 200 10*3/uL (ref 150.0–400.0)
RBC: 4.56 Mil/uL (ref 3.87–5.11)
RDW: 14.6 % (ref 11.5–14.6)
WBC: 7.5 10*3/uL (ref 4.5–10.5)

## 2011-12-25 MED ORDER — KETOROLAC TROMETHAMINE 60 MG/2ML IJ SOLN
60.0000 mg | Freq: Once | INTRAMUSCULAR | Status: AC
  Administered 2011-12-25: 60 mg via INTRAVENOUS

## 2011-12-25 MED ORDER — HYDROCODONE-ACETAMINOPHEN 5-500 MG PO TABS: 1.0000 | ORAL_TABLET | Freq: Three times a day (TID) | ORAL | Status: AC | PRN

## 2011-12-25 NOTE — Progress Notes (Signed)
  Subjective:    Patient ID: Stephanie Espinoza, female    DOB: September 17, 1956, 56 y.o.   MRN: [id number]  HPI 'feeling bad'- sxs started last night, woke her from sleep w/ 'splitting migraine'.  + nasal congestion, ear itching, fever.  Denies photo/phonophobia, dizziness.  sxs started suddenly.  + body aches, anorexia.  No N/V/D.  No cough.  + sick contacts.   Review of Systems For ROS see HPI     Objective:   Physical Exam  Vitals reviewed. Constitutional: She is oriented to person, place, and time. She appears well-developed and well-nourished. No distress.  HENT:  Head: Normocephalic and atraumatic.  Nose: Nose normal.  Mouth/Throat: Oropharynx is clear and moist. No oropharyngeal exudate.       No TTP over sinuses TMs normal bilaterally  Eyes: Conjunctivae and EOM are normal. Pupils are equal, round, and reactive to light.  Neck: Normal range of motion. Neck supple.       No meningeal signs  Cardiovascular: Normal rate, regular rhythm and normal heart sounds.   Pulmonary/Chest: Effort normal and breath sounds normal. No respiratory distress. She has no wheezes. She has no rales.  Abdominal: Soft. Bowel sounds are normal. She exhibits no distension. There is no tenderness. There is no rebound and no guarding.  Lymphadenopathy:    She has cervical adenopathy.  Neurological: She is alert and oriented to person, place, and time. She has normal reflexes. No cranial nerve deficit. Coordination normal.  Skin: Skin is warm and dry.          Assessment & Plan:

## 2011-12-25 NOTE — Assessment & Plan Note (Signed)
New.  Unknown source.  No obvious infxn on PE.  Suspect viral illness but given HIV status must consider fungal infection or other atypical infxns.  Get stat CBC.

## 2011-12-25 NOTE — Patient Instructions (Signed)
We'll notify you of your lab results Take the Vicodin as needed for pain REST! Drink plenty of fluids If your symptoms change or worsen- go to the ER Call with any questions or concerns Hang in there!

## 2011-12-25 NOTE — Assessment & Plan Note (Signed)
New.  Neuro exam WNL today.  toradol injxn given in office- pt tolerated w/out difficulty.  vicodin for home use prn.  Reviewed supportive care and red flags that should prompt return.  Pt expressed understanding and is in agreement w/ plan.

## 2012-01-28 ENCOUNTER — Other Ambulatory Visit: Payer: BC Managed Care – PPO

## 2012-02-11 ENCOUNTER — Ambulatory Visit: Payer: BC Managed Care – PPO | Admitting: Internal Medicine

## 2012-02-11 ENCOUNTER — Ambulatory Visit (INDEPENDENT_AMBULATORY_CARE_PROVIDER_SITE_OTHER): Payer: BC Managed Care – PPO | Admitting: Family Medicine

## 2012-02-11 ENCOUNTER — Encounter: Payer: Self-pay | Admitting: Family Medicine

## 2012-02-11 VITALS — BP 132/82 | HR 99 | Temp 99.4°F | Ht 63.75 in | Wt 177.6 lb

## 2012-02-11 DIAGNOSIS — H6692 Otitis media, unspecified, left ear: Secondary | ICD-10-CM | POA: Insufficient documentation

## 2012-02-11 DIAGNOSIS — J019 Acute sinusitis, unspecified: Secondary | ICD-10-CM

## 2012-02-11 DIAGNOSIS — H6690 Otitis media, unspecified, unspecified ear: Secondary | ICD-10-CM | POA: Insufficient documentation

## 2012-02-11 MED ORDER — AMOXICILLIN 875 MG PO TABS: 875.0000 mg | ORAL_TABLET | Freq: Two times a day (BID) | ORAL | Status: DC

## 2012-02-11 NOTE — Progress Notes (Signed)
  Subjective:    Patient ID: Stephanie Espinoza, female    DOB: [birth year], 56 y.o.   MRN: [id number]  HPI ? Sinus infxn- started w/ sore throat on Friday, Sunday had temp and cough.  Yesterday sxs had improved but last night developed facial pain, ear pain.  Taking Alavert w/ some relief.  + PND, cough.  + sick contacts.   Review of Systems For ROS see HPI     Objective:   Physical Exam  Constitutional: She appears well-developed and well-nourished. No distress.  HENT:  Head: Normocephalic and atraumatic.  Right Ear: Tympanic membrane normal.  Left Ear: Tympanic membrane is erythematous and retracted.  Nose: Mucosal edema and rhinorrhea present. Right sinus exhibits maxillary sinus tenderness and frontal sinus tenderness. Left sinus exhibits maxillary sinus tenderness and frontal sinus tenderness.  Mouth/Throat: Uvula is midline and mucous membranes are normal. Posterior oropharyngeal erythema present. No oropharyngeal exudate.  Eyes: Conjunctivae and EOM are normal. Pupils are equal, round, and reactive to light.  Neck: Normal range of motion. Neck supple.  Cardiovascular: Normal rate, regular rhythm and normal heart sounds.   Pulmonary/Chest: Effort normal and breath sounds normal. No respiratory distress. She has no wheezes.  Lymphadenopathy:    She has no cervical adenopathy.          Assessment & Plan:

## 2012-02-11 NOTE — Patient Instructions (Signed)
This is a L ear infection and a sinus infection Start the Amox twice daily w/ food Drink plenty of fluids REST! Hang in there!!!

## 2012-02-16 NOTE — Assessment & Plan Note (Signed)
New.  L OM.  Start amox.  Reviewed supportive care and red flags that should prompt return.  Pt expressed understanding and is in agreement w/ plan.

## 2012-02-16 NOTE — Assessment & Plan Note (Signed)
Pt's sxs and PE consistent w/ infxn.  Start amox.  Reviewed supportive care and red flags that should prompt return.  Pt expressed understanding and is in agreement w/ plan.

## 2012-03-02 ENCOUNTER — Telehealth: Payer: Self-pay | Admitting: Licensed Clinical Social Worker

## 2012-03-02 ENCOUNTER — Other Ambulatory Visit: Payer: Self-pay | Admitting: Internal Medicine

## 2012-03-02 DIAGNOSIS — A6000 Herpesviral infection of urogenital system, unspecified: Secondary | ICD-10-CM

## 2012-03-02 MED ORDER — ACYCLOVIR 400 MG PO TABS: 400.0000 mg | ORAL_TABLET | Freq: Three times a day (TID) | ORAL | Status: DC

## 2012-03-02 NOTE — Telephone Encounter (Signed)
I reorder acyclovir 400 mg 3 times a day for 7 days as needed with when necessary refills.

## 2012-03-02 NOTE — Telephone Encounter (Signed)
Patient would like Zovirax called in to her pharmacy, but I did not see it on her medication list. What dosage can I call in for her.

## 2012-03-03 ENCOUNTER — Other Ambulatory Visit: Payer: Self-pay | Admitting: Licensed Clinical Social Worker

## 2012-03-03 DIAGNOSIS — A6000 Herpesviral infection of urogenital system, unspecified: Secondary | ICD-10-CM

## 2012-03-03 MED ORDER — ACYCLOVIR 400 MG PO TABS: 400.0000 mg | ORAL_TABLET | Freq: Three times a day (TID) | ORAL | Status: DC

## 2012-03-11 ENCOUNTER — Encounter: Payer: Self-pay | Admitting: *Deleted

## 2012-03-12 ENCOUNTER — Telehealth: Payer: Self-pay | Admitting: Licensed Clinical Social Worker

## 2012-03-12 DIAGNOSIS — A6000 Herpesviral infection of urogenital system, unspecified: Secondary | ICD-10-CM

## 2012-03-12 MED ORDER — ACYCLOVIR 400 MG PO TABS: 400.0000 mg | ORAL_TABLET | Freq: Three times a day (TID) | ORAL | Status: AC

## 2012-03-12 NOTE — Telephone Encounter (Signed)
Patient called stating that she has a herpes outbreak and wanted Acyclovir which I called in, but also Ointment to apply to the area that I do not see on her medication list. I looked in our old emr system and did not see it there either. Please advise.

## 2012-03-12 NOTE — Telephone Encounter (Signed)
Please let Zlata know that the acyclovir ointment is no longer felt to be useful and should not offer any benefit above and beyond that taking the oral acyclovir. She can use a nonmedicated, over-the-counter ointment if it makes her feel better.

## 2012-03-24 ENCOUNTER — Other Ambulatory Visit: Payer: BC Managed Care – PPO

## 2012-03-24 DIAGNOSIS — B20 Human immunodeficiency virus [HIV] disease: Secondary | ICD-10-CM

## 2012-03-24 LAB — RPR

## 2012-03-24 LAB — LIPID PANEL
Cholesterol: 171 mg/dL (ref 0–200)
HDL: 42 mg/dL (ref >39)
LDL Cholesterol: 90 mg/dL (ref 0–99)
Total CHOL/HDL Ratio: 4.1 Ratio
Triglycerides: 196 mg/dL — ABNORMAL HIGH (ref <150)
VLDL: 39 mg/dL (ref 0–40)

## 2012-03-24 LAB — CBC
HCT: 38.8 % (ref 36.0–46.0)
Hemoglobin: 12.4 g/dL (ref 12.0–15.0)
MCH: 27 pg (ref 26.0–34.0)
MCHC: 32 g/dL (ref 30.0–36.0)
MCV: 84.3 fL (ref 78.0–100.0)
Platelets: 225 10*3/uL (ref 150–400)
RBC: 4.6 MIL/uL (ref 3.87–5.11)
RDW: 14.7 % (ref 11.5–15.5)
WBC: 4.1 10*3/uL (ref 4.0–10.5)

## 2012-03-24 LAB — COMPREHENSIVE METABOLIC PANEL
ALT: 14 U/L (ref 0–35)
AST: 18 U/L (ref 0–37)
Albumin: 3.8 g/dL (ref 3.5–5.2)
Alkaline Phosphatase: 88 U/L (ref 39–117)
BUN: 12 mg/dL (ref 6–23)
CO2: 25 mEq/L (ref 19–32)
Calcium: 9.3 mg/dL (ref 8.4–10.5)
Chloride: 106 mEq/L (ref 96–112)
Creat: 0.92 mg/dL (ref 0.50–1.10)
Glucose, Bld: 93 mg/dL (ref 70–99)
Potassium: 3.9 mEq/L (ref 3.5–5.3)
Sodium: 139 mEq/L (ref 135–145)
Total Bilirubin: 1.5 mg/dL — ABNORMAL HIGH (ref 0.3–1.2)
Total Protein: 7.7 g/dL (ref 6.0–8.3)

## 2012-03-25 LAB — T-HELPER CELL (CD4) - (RCID CLINIC ONLY)
CD4 % Helper T Cell: 17 % — ABNORMAL LOW (ref 33–55)
CD4 T Cell Abs: 440 uL (ref 400–2700)

## 2012-03-25 LAB — HIV-1 RNA QUANT-NO REFLEX-BLD
HIV 1 RNA Quant: 25 copies/mL — ABNORMAL HIGH (ref <20)
HIV-1 RNA Quant, Log: 1.4 {Log} — ABNORMAL HIGH (ref <1.30)

## 2012-04-07 ENCOUNTER — Ambulatory Visit: Payer: BC Managed Care – PPO | Admitting: Internal Medicine

## 2012-04-08 ENCOUNTER — Telehealth: Payer: Self-pay | Admitting: *Deleted

## 2012-04-08 NOTE — Telephone Encounter (Signed)
Left message.  Told pt that during the month of June there are AM and PM appt throughout the month for both PAP smear and MD times.  Left tel. # and to select Option 1 to make appts.

## 2012-08-28 ENCOUNTER — Ambulatory Visit (INDEPENDENT_AMBULATORY_CARE_PROVIDER_SITE_OTHER): Payer: BC Managed Care – PPO | Admitting: Family Medicine

## 2012-08-28 ENCOUNTER — Telehealth: Payer: Self-pay | Admitting: Family Medicine

## 2012-08-28 ENCOUNTER — Encounter: Payer: Self-pay | Admitting: Family Medicine

## 2012-08-28 VITALS — BP 130/74 | HR 96 | Temp 98.8°F | Ht 63.0 in | Wt 177.2 lb

## 2012-08-28 DIAGNOSIS — J019 Acute sinusitis, unspecified: Secondary | ICD-10-CM

## 2012-08-28 MED ORDER — AMOXICILLIN 875 MG PO TABS: 875.0000 mg | ORAL_TABLET | Freq: Two times a day (BID) | ORAL | Status: AC

## 2012-08-28 NOTE — Progress Notes (Signed)
  Subjective:    Patient ID: Stephanie Espinoza, female    DOB: [birth year], 56 y.o.   MRN: [id number]  HPI ? Sinus infxn- sxs started 2 days ago w/ sneezing.  Yesterday had nasal congestion- it was constant.  + fatigue, facial pain, HA, L ear pain.  No cough.  + subjective fever.  No sore throat.  No known sick contacts.  No N/V/D.   Review of Systems For ROS see HPI     Objective:   Physical Exam  Vitals reviewed. Constitutional: She appears well-developed and well-nourished. No distress.  HENT:  Head: Normocephalic and atraumatic.  Right Ear: Tympanic membrane normal.  Left Ear: Tympanic membrane normal.  Nose: Mucosal edema and rhinorrhea present. Right sinus exhibits maxillary sinus tenderness and frontal sinus tenderness. Left sinus exhibits maxillary sinus tenderness and frontal sinus tenderness.  Mouth/Throat: Uvula is midline and mucous membranes are normal. Posterior oropharyngeal erythema present. No oropharyngeal exudate.  Eyes: Conjunctivae normal and EOM are normal. Pupils are equal, round, and reactive to light.  Neck: Normal range of motion. Neck supple.  Cardiovascular: Normal rate, regular rhythm and normal heart sounds.   Pulmonary/Chest: Effort normal and breath sounds normal. No respiratory distress. She has no wheezes.  Lymphadenopathy:    She has no cervical adenopathy.          Assessment & Plan:

## 2012-08-28 NOTE — Telephone Encounter (Signed)
Caller: Stephanie Espinoza/Patient; Patient Name: Stephanie Espinoza; PCP: Sheliah Hatch.; Best Callback Phone Number: [phone]. Call regarding facial pain, nasal congestion, drainage, yellow nasal mucus. Onset 08/26/12. Temperature 99.3 oral. Taking OTC Cold/Sinus medicine with no relief. Triaged per Upper Respiratory Infection guideline, disposition: see provider within 24 hours for "Facial pain, frontal headache, yellow-green nasal discharge and any temperature elevation." Patient currently has an appointment scheduled this afternoon (08/28/12) at 1:45pm. Advised patient to keep that appointment. Care advice and call back parameters given per guideline. Patient verbalized understanding.

## 2012-08-28 NOTE — Patient Instructions (Addendum)
This is a sinus infection Start the Amoxicillin twice daily- take w/ food Drink plenty of fluids REST! Hang in there!!! 

## 2012-08-28 NOTE — Assessment & Plan Note (Signed)
Pt's sxs and PE consistent w/ infxn.  Start abx.  Reviewed supportive care and red flags that should prompt return.  Pt expressed understanding and is in agreement w/ plan.  

## 2012-08-28 NOTE — Telephone Encounter (Signed)
Pt to be treated today by MD Beverely Low

## 2012-09-21 ENCOUNTER — Encounter: Payer: Self-pay | Admitting: Family Medicine

## 2012-09-21 ENCOUNTER — Ambulatory Visit (INDEPENDENT_AMBULATORY_CARE_PROVIDER_SITE_OTHER): Payer: BC Managed Care – PPO | Admitting: Family Medicine

## 2012-09-21 VITALS — BP 122/80 | HR 97 | Temp 98.1°F | Wt 177.0 lb

## 2012-09-21 DIAGNOSIS — H8110 Benign paroxysmal vertigo, unspecified ear: Secondary | ICD-10-CM

## 2012-09-21 DIAGNOSIS — H6500 Acute serous otitis media, unspecified ear: Secondary | ICD-10-CM

## 2012-09-21 MED ORDER — MECLIZINE HCL 50 MG PO TABS: 50.0000 mg | ORAL_TABLET | Freq: Three times a day (TID) | ORAL | Status: DC | PRN

## 2012-09-21 NOTE — Assessment & Plan Note (Signed)
New.  Likely contributing to pt's vertigo.  Start decongestant to improve pain and dizziness.  Reviewed supportive care and red flags that should prompt return.  Pt expressed understanding and is in agreement w/ plan.

## 2012-09-21 NOTE — Progress Notes (Signed)
  Subjective:    Patient ID: Stephanie Espinoza, female    DOB: 12/13/1955, 56 y.o.   MRN: [id number]  HPI Pt thought she was 'catching something' over the weekend.  Had mild diarrhea yesterday.  This AM had ear pain, dizziness w/ rapid movement, 'splitting headache' (frontal).  Low grade temp this AM.  Took OTC decongestant w/ mild relief.  Will have vertigo w/ turning head, glancing sideways, standing too fast- 'i feel like i'm going to fall over'.  + sick contacts.  Recent completed abx course from last sinus infxn.   Review of Systems For ROS see HPI     Objective:   Physical Exam  Vitals reviewed. Constitutional: She is oriented to person, place, and time. She appears well-developed and well-nourished.       Obviously uncomfortable  HENT:  Head: Normocephalic and atraumatic.  Nose: Nose normal.  Mouth/Throat: Oropharynx is clear and moist. No oropharyngeal exudate.       No TTP over sinuses Dull light reflex of ears bilaterally + serous fluid w/ some bulging on R No erythema or purulent material noted  Eyes: Conjunctivae normal and EOM are normal. Pupils are equal, round, and reactive to light.  Neck: Normal range of motion. Neck supple.  Cardiovascular: Normal rate, regular rhythm and normal heart sounds.   Pulmonary/Chest: Effort normal and breath sounds normal. No respiratory distress. She has no wheezes. She has no rales.  Neurological: She is alert and oriented to person, place, and time. She has normal reflexes. No cranial nerve deficit. Coordination normal.          Assessment & Plan:

## 2012-09-21 NOTE — Assessment & Plan Note (Signed)
New.  Likely due to serous otitis.  Start meclizine for symptomatic relief and decongestant to improve serous otitis.  Reviewed supportive care and red flags that should prompt return.  Pt expressed understanding and is in agreement w/ plan.

## 2012-09-21 NOTE — Patient Instructions (Addendum)
Continue the Claritin D daily to decrease fluid in your ears You can take sudafed instead of Claritin D if it works better for you Use the Meclizine as needed for the dizziness Drink plenty of fluids REST! Change positions slowly Hang in there!

## 2012-09-24 ENCOUNTER — Ambulatory Visit (INDEPENDENT_AMBULATORY_CARE_PROVIDER_SITE_OTHER): Payer: BC Managed Care – PPO | Admitting: Internal Medicine

## 2012-09-24 ENCOUNTER — Encounter: Payer: Self-pay | Admitting: Internal Medicine

## 2012-09-24 VITALS — BP 124/84 | HR 82 | Temp 98.0°F | Ht 63.0 in | Wt 179.0 lb

## 2012-09-24 DIAGNOSIS — H6690 Otitis media, unspecified, unspecified ear: Secondary | ICD-10-CM

## 2012-09-24 DIAGNOSIS — H6500 Acute serous otitis media, unspecified ear: Secondary | ICD-10-CM

## 2012-09-24 DIAGNOSIS — H6692 Otitis media, unspecified, left ear: Secondary | ICD-10-CM

## 2012-09-24 DIAGNOSIS — H9209 Otalgia, unspecified ear: Secondary | ICD-10-CM

## 2012-09-24 MED ORDER — AMOXICILLIN 500 MG PO CAPS: 500.0000 mg | ORAL_CAPSULE | Freq: Three times a day (TID) | ORAL | Status: DC

## 2012-09-24 NOTE — Progress Notes (Signed)
Subjective:    Patient ID: Stephanie Espinoza, female    DOB: 29-Jan-1956, 56 y.o.   MRN: [id number]  HPI  Pt presents to the clinic today with c/o ear pain x 1 week. She was recently seen by Dr. Beverely Low on 09/21/2012 for the same. She was diagnosed with serous otitis media, prescribed meclizine and suggested taking and OTC decongestant. Since that time, the pain in the right ear is slightly better but the pain in the left ear is worse. The pain is 6/10, and feels like someone is constantly stabbing her in the ear. She has no dizziness since starting the Meclizine, so she feels better in that respect.  Review of Systems  Past Medical History  Diagnosis Date  . Allergic rhinitis   . Anxiety   . Depression   . HIV (human immunodeficiency virus infection)   . Bell's palsy   . Genital herpes   . Syphilis   . History of drug abuse   . Abnormal finding on Pap smear     Current Outpatient Prescriptions  Medication Sig Dispense Refill  . albuterol (PROAIR HFA) 108 (90 BASE) MCG/ACT inhaler Inhale 2 puffs into the lungs 4 (four) times daily as needed.        [name] atazanavir (REYATAZ) 300 MG capsule Take 1 capsule (300 mg total) by mouth daily.  30 capsule  11  . emtricitabine-tenofovir (TRUVADA) 200-300 MG per tablet Take 1 tablet by mouth daily.  30 tablet  11  . fexofenadine (ALLEGRA) 180 MG tablet Take 180 mg by mouth daily.        . fluticasone (VERAMYST) 27.5 MCG/SPRAY nasal spray 2 sprays by Nasal route daily.        . meclizine (ANTIVERT) 50 MG tablet Take 1 tablet (50 mg total) by mouth 3 (three) times daily as needed.  45 tablet  0  . ritonavir (NORVIR) 100 MG capsule Take 1 capsule (100 mg total) by mouth daily.  30 capsule  11    Allergies  Allergen Reactions  . Nevirapine     REACTION: Rash 10/08    Family History  Problem Relation Age of Onset  . Diabetes Father     History   Social History  . Marital Status: Divorced    Spouse Name: N/A    Number of Children: N/A  . Years  of Education: N/A   Occupational History  . Not on file.   Social History Main Topics  . Smoking status: Never Smoker   . Smokeless tobacco: Not on file  . Alcohol Use: No  . Drug Use: No  . Sexually Active: No     Comment: delcined condoms   Other Topics Concern  . Not on file   Social History Narrative  . No narrative on file     Constitutional: Pt reports malaise. Denies fever, fatigue, headache or abrupt weight changes.  HEENT:  Pt reports bilateral ear pain. Denies eye pain, eye redness, ringing in the ears, wax buildup, runny nose, nasal congestion, bloody nose, or sore throat. Respiratory: Denies difficulty breathing, shortness of breath, cough or sputum production.   Cardiovascular: Denies chest pain, chest tightness, palpitations or swelling in the hands or feet.  Neurological: Denies dizziness, difficulty with memory, difficulty with speech or problems with balance and coordination.   No other specific complaints in a complete review of systems (except as listed in HPI above).     Objective:   Physical Exam  BP 124/84  Pulse 82  Temp 98 F (36.7 C) (Oral)  Ht 5\' 3"  (1.6 m)  Wt 179 lb (81.194 kg)  BMI 31.71 kg/m2  SpO2 97% Wt Readings from Last 3 Encounters:  09/24/12 179 lb (81.194 kg)  09/21/12 177 lb (80.287 kg)  08/28/12 177 lb 3.2 oz (80.377 kg)    General: Appears her stated age, well developed, well nourished in NAD. HEENT: Head: normal shape and size; Eyes: sclera white, no icterus, conjunctiva pink, PERRLA and EOMs intact; Ears: Left Tm's red, slightly bulging and intact, diminished light reflex; Right TM gray, diminished light reflex + effusion noted. Nose: mucosa pink and moist, septum midline; Throat/Mouth: Teeth present, mucosa pink and moist, no exudate, lesions or ulcerations noted.  Neck: Normal range of motion. Neck supple, trachea midline. No massses, lumps or thyromegaly present.  Cardiovascular: Normal rate and rhythm. S1,S2 noted.  No  murmur, rubs or gallops noted. No JVD or BLE edema. No carotid bruits noted. Pulmonary/Chest: Normal effort and positive vesicular breath sounds. No respiratory distress. No wheezes, rales or ronchi noted.  Neurological: Alert and oriented. Cranial nerves II-XII intact. Coordination normal. +DTRs bilaterally.      Assessment & Plan:   Serous Otitis Media, right:  Continue taking OTC decongestant  Otitis Media, left:  eRx sent in for Amoxil 500 mg TID x 10 days

## 2012-09-24 NOTE — Patient Instructions (Addendum)
Serous Otitis Media   Serous otitis media is also known as otitis media with effusion (OME). It means there is fluid in the middle ear space. This space contains the bones for hearing and air. Air in the middle ear space helps to transmit sound.   The air gets there through the eustachian tube. This tube goes from the back of the throat to the middle ear space. It keeps the pressure in the middle ear the same as the outside world. It also helps to drain fluid from the middle ear space. CAUSES   OME occurs when the eustachian tube gets blocked. Blockage can come from:  Ear infections.   Colds and other upper respiratory infections.   Allergies.   Irritants such as cigarette smoke.   Sudden changes in air pressure (such as descending in an airplane).   Enlarged adenoids.  During colds and upper respiratory infections, the middle ear space can become temporarily filled with fluid. This can happen after an ear infection also. Once the infection clears, the fluid will generally drain out of the ear through the eustachian tube. If it does not, then OME occurs. SYMPTOMS    Hearing loss.   A feeling of fullness in the ear  but no pain.   Young children may not show any symptoms.  DIAGNOSIS    Diagnosis of OME is made by an ear exam.   Tests may be done to check on the movement of the eardrum.   Hearing exams may be done.  TREATMENT    The fluid most often goes away without treatment.   If allergy is the cause, allergy treatment may be helpful.   Fluid that persists for several months may require minor surgery. A small tube is placed in the ear drum to:   Drain the fluid.   Restore the air in the middle ear space.   In certain situations, antibiotics are used to avoid surgery.   Surgery may be done to remove enlarged adenoids (if this is the cause).  HOME CARE INSTRUCTIONS    Keep children away from tobacco smoke.   Be sure to keep follow up appointments, if any.  SEEK  MEDICAL CARE IF:    Hearing is not better in 3 months.   Hearing is worse.   Ear pain.   Drainage from the ear.   Dizziness.  Document Released: 01/11/2004 Document Revised: 01/13/2012 Document Reviewed: 11/10/2008 [facility] Patient Information 2013 [place], [place].   Otitis Media, Adult A middle ear infection is an infection in the space behind the eardrum. The medical name for this is "otitis media." It may happen after a common cold. It is caused by a germ that starts growing in that space. You may feel swollen glands in your neck on the side of the ear infection. HOME CARE INSTRUCTIONS    Take your medicine as directed until it is gone, even if you feel better after the first few days.   Only take over-the-counter or prescription medicines for pain, discomfort, or fever as directed by your caregiver.   Occasional use of a nasal decongestant a couple times per day may help with discomfort and help the eustachian tube to drain better.  Follow up with your caregiver in 10 to 14 days or as directed, to be certain that the infection has cleared. Not keeping the appointment could result in a chronic or permanent injury, pain, hearing loss and disability. If there is any problem keeping the appointment, you must call  back to this facility for assistance. SEEK IMMEDIATE MEDICAL CARE IF:    You are not getting better in 2 to 3 days.   You have pain that is not controlled with medication.   You feel worse instead of better.   You cannot use the medication as directed.   You develop swelling, redness or pain around the ear or stiffness in your neck.  MAKE SURE YOU:    Understand these instructions.   Will watch your condition.   Will get help right away if you are not doing well or get worse.  Document Released: 07/26/2004 Document Revised: 01/13/2012 Document Reviewed: 05/27/2008 [facility] Patient Information 2013 [place], [place].

## 2012-11-02 ENCOUNTER — Ambulatory Visit (INDEPENDENT_AMBULATORY_CARE_PROVIDER_SITE_OTHER): Payer: BC Managed Care – PPO | Admitting: Family Medicine

## 2012-11-02 ENCOUNTER — Encounter: Payer: Self-pay | Admitting: Family Medicine

## 2012-11-02 VITALS — BP 130/90 | HR 95 | Temp 99.2°F | Wt 178.0 lb

## 2012-11-02 DIAGNOSIS — J329 Chronic sinusitis, unspecified: Secondary | ICD-10-CM

## 2012-11-02 DIAGNOSIS — J069 Acute upper respiratory infection, unspecified: Secondary | ICD-10-CM

## 2012-11-02 MED ORDER — AMOXICILLIN 875 MG PO TABS: 875.0000 mg | ORAL_TABLET | Freq: Two times a day (BID) | ORAL | Status: DC

## 2012-11-02 NOTE — Patient Instructions (Addendum)
INSTRUCTIONS FOR UPPER RESPIRATORY INFECTION:  -plenty of rest and fluids  -take antibiotic as instructed -As we discussed, we have prescribed a new medication (AMOXICILLIN) for you at this appointment. We discussed the common and serious potential adverse effects of this medication and you can review these and more with the pharmacist when you pick up your medication.  Please follow the instructions for use carefully and notify us immediately if you have any problems taking this medication.   -nasal saline wash 2-3 times daily (use prepackaged nasal saline or bottled/distilled water if making your own)   -can use sinex nasal spray for drainage and nasal congestion - but do NOT use longer then 3-4 days  -can use tylenol or ibuprofen as directed for aches and sorethroat  -in the winter time, using a humidifier at night is helpful (please follow cleaning instructions)  -if you are taking a cough medication - use only as directed, may also try a teaspoon of honey to coat the throat and throat lozenges  -for sore throat, salt water gargles can help  -follow up if you have fevers, facial pain, tooth pain, difficulty breathing or are worsening or not getting better in 5-7 days  

## 2012-11-02 NOTE — Progress Notes (Signed)
Chief Complaint  Patient presents with  . Headache    sneezing, fever of 101, fatigue, chills, coughing, ear pain, sore throat     HPI: -started: 5 days - could not get in to see PCP today -symptoms:fever of 101 first day, no fever the last few days, some sinus pain, nasal congestion, sore throat, cough, decreased appetite -denies:SOB, NVD, tooth pain, strep, flu or mono exposure -has tried: ginger tea -sick contacts: none known -Hx of: didn't have flu shot, hx of sinusitis tx with amoxicillin -hx HIV followe by her PCP per her report, reports stable for many years   ROS: See pertinent positives and negatives per HPI.  Past Medical History  Diagnosis Date  . Allergic rhinitis   . Anxiety   . Depression   . HIV (human immunodeficiency virus infection)   . Bell's palsy   . Genital herpes   . Syphilis   . History of drug abuse   . Abnormal finding on Pap smear     Family History  Problem Relation Age of Onset  . Diabetes Father     History   Social History  . Marital Status: Divorced    Spouse Name: N/A    Number of Children: N/A  . Years of Education: N/A   Social History Main Topics  . Smoking status: Never Smoker   . Smokeless tobacco: None  . Alcohol Use: No  . Drug Use: No  . Sexually Active: No     Comment: delcined condoms   Other Topics Concern  . None   Social History Narrative  . None    Current outpatient prescriptions:albuterol (PROAIR HFA) 108 (90 BASE) MCG/ACT inhaler, Inhale 2 puffs into the lungs 4 (four) times daily as needed.  , Disp: , Rfl: ;  amoxicillin (AMOXIL) 875 MG tablet, Take 1 tablet (875 mg total) by mouth 2 (two) times daily., Disp: 20 tablet, Rfl: 0;  atazanavir (REYATAZ) 300 MG capsule, Take 1 capsule (300 mg total) by mouth daily., Disp: 30 capsule, Rfl: 11 emtricitabine-tenofovir (TRUVADA) 200-300 MG per tablet, Take 1 tablet by mouth daily., Disp: 30 tablet, Rfl: 11;  fexofenadine (ALLEGRA) 180 MG tablet, Take 180 mg by mouth  daily.  , Disp: , Rfl: ;  fluticasone (VERAMYST) 27.5 MCG/SPRAY nasal spray, 2 sprays by Nasal route daily.  , Disp: , Rfl: ;  meclizine (ANTIVERT) 50 MG tablet, Take 1 tablet (50 mg total) by mouth 3 (three) times daily as needed., Disp: 45 tablet, Rfl: 0 ritonavir (NORVIR) 100 MG capsule, Take 1 capsule (100 mg total) by mouth daily., Disp: 30 capsule, Rfl: 11  EXAM:  Filed Vitals:   11/02/12 1415  BP: 130/90  Pulse: 95  Temp: 99.2 F (37.3 C)    There is no height on file to calculate BMI.  GENERAL: vitals reviewed and listed above, alert, oriented, appears well hydrated and in no acute distress  HEENT: atraumatic, conjunttiva clear, no obvious abnormalities on inspection of external nose and ears, normal appearance of ear canals and TMs, white nasal congestion, mild post oropharyngeal erythema with PND, no tonsillar edema or exudate, no sinus TTP  NECK: no obvious masses on inspection  LUNGS: clear to auscultation bilaterally, no wheezes, rales or rhonchi, good air movement  CV: HRRR, no peripheral edema  MS: moves all extremities without noticeable abnormality  PSYCH: pleasant and cooperative, no obvious depression or anxiety  ASSESSMENT AND PLAN:  Discussed the following assessment and plan:  1. Upper respiratory infection  2. Sinusitis  amoxicillin (AMOXIL) 875 MG tablet - discussed risks/benefits   -hx sinusitis with several days sinus pain and pressure and fever initially, possibly viral versus bacterial sinusitis, no alarm symptoms -Patient advised to return or notify a doctor immediately if symptoms worsen or persist or new concerns arise.  There are no Patient Instructions on file for this visit.   Kriste Basque R.

## 2012-11-09 ENCOUNTER — Other Ambulatory Visit (INDEPENDENT_AMBULATORY_CARE_PROVIDER_SITE_OTHER): Payer: BC Managed Care – PPO

## 2012-11-09 DIAGNOSIS — B20 Human immunodeficiency virus [HIV] disease: Secondary | ICD-10-CM

## 2012-11-10 LAB — T-HELPER CELL (CD4) - (RCID CLINIC ONLY)
CD4 % Helper T Cell: 18 % — ABNORMAL LOW (ref 33–55)
CD4 T Cell Abs: 480 uL (ref 400–2700)

## 2012-11-11 LAB — HIV-1 RNA QUANT-NO REFLEX-BLD
HIV 1 RNA Quant: 405 copies/mL — ABNORMAL HIGH (ref <20)
HIV-1 RNA Quant, Log: 2.61 {Log} — ABNORMAL HIGH (ref <1.30)

## 2012-11-23 ENCOUNTER — Ambulatory Visit (INDEPENDENT_AMBULATORY_CARE_PROVIDER_SITE_OTHER): Payer: BC Managed Care – PPO | Admitting: Internal Medicine

## 2012-11-23 ENCOUNTER — Encounter: Payer: Self-pay | Admitting: Internal Medicine

## 2012-11-23 VITALS — BP 152/92 | HR 83 | Temp 98.2°F | Ht 63.0 in | Wt 173.0 lb

## 2012-11-23 DIAGNOSIS — Z23 Encounter for immunization: Secondary | ICD-10-CM

## 2012-11-23 DIAGNOSIS — Z21 Asymptomatic human immunodeficiency virus [HIV] infection status: Secondary | ICD-10-CM

## 2012-11-23 NOTE — Progress Notes (Signed)
Patient ID: Stephanie Espinoza, female   DOB: [birth year], 57 y.o.   MRN: [id number]     [facility]  Patient Active Problem List  Diagnosis  . HIV DISEASE  . GENITAL HERPES  . SYPHILIS NOS  . OTHER NEUTROPENIA  . ANXIETY  . DEPRESSION  . BELL'S PALSY  . SINUSITIS - ACUTE-NOS  . ALLERGIC RHINITIS  . ASTHMA UNSPECIFIED WITH EXACERBATION  . PAP SMEAR, ABNORMAL  . Shoulder pain  . UTI (lower urinary tract infection)  . Migraine  . OM (otitis media)  . Acute serous otitis media  . Vertigo, benign positional    Patient's Medications  New Prescriptions   No medications on file  Previous Medications   ALBUTEROL (PROAIR HFA) 108 (90 BASE) MCG/ACT INHALER    Inhale 2 puffs into the lungs 4 (four) times daily as needed.     FEXOFENADINE (ALLEGRA) 180 MG TABLET    Take 180 mg by mouth daily.     FLUTICASONE (VERAMYST) 27.5 MCG/SPRAY NASAL SPRAY    2 sprays by Nasal route daily.    Modified Medications   No medications on file  Discontinued Medications   AMOXICILLIN (AMOXIL) 875 MG TABLET    Take 1 tablet (875 mg total) by mouth 2 (two) times daily.   ATAZANAVIR (REYATAZ) 300 MG CAPSULE    Take 1 capsule (300 mg total) by mouth daily.   EMTRICITABINE-TENOFOVIR (TRUVADA) 200-300 MG PER TABLET    Take 1 tablet by mouth daily.   MECLIZINE (ANTIVERT) 50 MG TABLET    Take 1 tablet (50 mg total) by mouth 3 (three) times daily as needed.   RITONAVIR (NORVIR) 100 MG CAPSULE    Take 1 capsule (100 mg total) by mouth daily.    Subjective: Stephanie Espinoza is in for her first visit in one year. She is now completed her masters and is working full-time. She is enjoying her work. Her daughter is now graduating from high school and anticipating going to [employer] next year. After her visit one year ago she did start taking Truvada, Reyataz and Norvir. Initially, she felt like she tolerated the regimen well but after 2 months she began to notice some weight gain as well as  feeling withdrawn like she didn't want to be around friends. She took the medication consistently to see if the side effects would get better but they did not and she finally stopped taking the medicines for good in June. She has been off of all of her medicines since that time. She has had some problems with intermittent sinus symptoms and has been treated with allergy medication, albuterol inhaler and several rounds of antibiotics recently. Currently she is feeling well.  Objective: Temp: 98.2 F (36.8 C) (01/20 1448) Temp src: Oral (01/20 1448) BP: 152/92 mmHg (01/20 1448) Pulse Rate: 83  (01/20 1448)  General: She appears health will spirits Skin: No rash  Lab Results HIV 1 RNA Quant (copies/mL)  Date Value  11/09/2012 405*  03/24/2012 25*  10/24/2011 2620*     CD4 T Cell Abs (cmm)  Date Value  11/09/2012 480   03/24/2012 440   10/24/2011 370*     Assessment: [name]'s viral load did suppress to 25 while she was taking her antiretroviral regimen last spring. Unfortunately, she felt like she could not tolerate the regimen and stopped it 6 months ago without calling to let me know. Her CD4 count remains normal and her viral load is relatively low  but she does want to consider restarting an alternative regimen. I will need to review all past regimens, side effects and determine whether or not she's had previous resistance testing before deciding upon a new salvage regimen.  Plan: 1. Review old records to crafter new salvage regimen 2. I will call her on her cell, [phone] to review treatment options   Cliffton Asters, MD [facility] [phone] pager   [phone] cell 11/23/2012, 3:41 PM

## 2013-01-14 ENCOUNTER — Telehealth: Payer: Self-pay | Admitting: Family Medicine

## 2013-01-14 DIAGNOSIS — H9209 Otalgia, unspecified ear: Secondary | ICD-10-CM

## 2013-01-14 NOTE — Telephone Encounter (Signed)
pt would like a referral to an ENT as she states her RT ear is not getting better since 11.18.13 visit. pt notes it is off & an cb# [phone]

## 2013-01-15 NOTE — Telephone Encounter (Signed)
Please advise on ENT referral.//AB/CMA

## 2013-01-15 NOTE — Telephone Encounter (Signed)
Spoke with the pt and informed her that Dr. Tawni Carnes the referral to the ENT, and she will hear back with an appt.  Referral ordered.//AB/CMA

## 2013-01-15 NOTE — Telephone Encounter (Signed)
Ok for ENT referral- dx otalgia

## 2013-01-25 ENCOUNTER — Ambulatory Visit (INDEPENDENT_AMBULATORY_CARE_PROVIDER_SITE_OTHER): Payer: BC Managed Care – PPO | Admitting: Internal Medicine

## 2013-01-25 ENCOUNTER — Encounter: Payer: Self-pay | Admitting: Internal Medicine

## 2013-01-25 ENCOUNTER — Encounter: Payer: Self-pay | Admitting: *Deleted

## 2013-01-25 VITALS — BP 143/87 | HR 80 | Temp 98.4°F | Ht 63.0 in | Wt 173.0 lb

## 2013-01-25 DIAGNOSIS — B20 Human immunodeficiency virus [HIV] disease: Secondary | ICD-10-CM

## 2013-01-25 MED ORDER — EMTRICITAB-RILPIVIR-TENOFOV DF 200-25-300 MG PO TABS: 1.0000 | ORAL_TABLET | Freq: Every day | ORAL | Status: DC

## 2013-01-25 MED ORDER — ONDANSETRON HCL 4 MG PO TABS: 4.0000 mg | ORAL_TABLET | Freq: Three times a day (TID) | ORAL | Status: DC | PRN

## 2013-01-25 NOTE — Progress Notes (Addendum)
Patient ID: Stephanie Espinoza, female   DOB: 1956/03/22, 57 y.o.   MRN: [id number] Pt shared that she has used Zovirax cream in the past for genital herpes outbreaks and would like to have a new prescription for this sent in to pharmacy.  Please advise.    Please let Anner know that I would prefer to use oral Valtrex 500 mg twice daily for 5 days starting is and if she begins to notice an outbreak. This is much more effective than topical acyclovir. If she is in agreement with this please prescribed the Valtrex with 6 refills

## 2013-01-25 NOTE — Progress Notes (Signed)
Patient ID: Stephanie Espinoza, female   DOB: 1956/01/22, 57 y.o.   MRN: [id number]          [facility] for Infectious Disease  Patient Active Problem List  Diagnosis  . HIV DISEASE  . GENITAL HERPES  . SYPHILIS NOS  . OTHER NEUTROPENIA  . ANXIETY  . DEPRESSION  . BELL'S PALSY  . SINUSITIS - ACUTE-NOS  . ALLERGIC RHINITIS  . ASTHMA UNSPECIFIED WITH EXACERBATION  . PAP SMEAR, ABNORMAL  . Shoulder pain  . UTI (lower urinary tract infection)  . Migraine  . OM (otitis media)  . Acute serous otitis media  . Vertigo, benign positional    Patient's Medications  New Prescriptions   EMTRICITAB-RILPIVIR-TENOFOVIR 200-25-300 MG TABS    Take 1 tablet by mouth daily.   ONDANSETRON (ZOFRAN) 4 MG TABLET    Take 1 tablet (4 mg total) by mouth every 8 (eight) hours as needed for nausea.  Previous Medications   ALBUTEROL (PROAIR HFA) 108 (90 BASE) MCG/ACT INHALER    Inhale 2 puffs into the lungs 4 (four) times daily as needed.     FEXOFENADINE (ALLEGRA) 180 MG TABLET    Take 180 mg by mouth daily.     FLUTICASONE (VERAMYST) 27.5 MCG/SPRAY NASAL SPRAY    2 sprays by Nasal route daily.    Modified Medications   No medications on file  Discontinued Medications   No medications on file    Subjective: Stephanie Espinoza is in for her routine visit. She states that she is hopeful that she will be able to restart HIV medication and tolerated well this time. She is currently not on any medications. I reviewed all of her past regimens. She's been on a large number of different antiretroviral combinations including Retrovir, Videx, Epivir, Viread, Combivir, Truvada, Sustiva, Viramune, Atripla, Kaletra, Lexiva, Reyataz, and Norvir. Frequently she will tolerate the medications for the first few months then have problems with intolerance. My past notes it indicated that she has had a variety of problems including feeling very fatigued and tired, nausea, vomiting, urinary frequency, difficulty sleeping, weight gain and  feeling withdrawn that she has attributed to her various regimens.  Objective: Temp: 98.4 F (36.9 C) (03/24 1509) Temp src: Oral (03/24 1509) BP: 143/87 mmHg (03/24 1509) Pulse Rate: 80 (03/24 1509)  General: she appears healthy and in good spirits Skin: no rash Lungs: clear Cor: regular S1 and S2 no murmurs  Lab Results HIV 1 RNA Quant (copies/mL)  Date Value  11/09/2012 405*  03/24/2012 25*  10/24/2011 2620*     CD4 T Cell Abs (cmm)  Date Value  11/09/2012 480   03/24/2012 440   10/24/2011 370*     Assessment: Her viral load remains relatively low despite having been off of all antiretroviral therapy since last summer. However, she is eager to restart therapy with a new regimen and she is quite hopeful that she will tolerate this better than previous regimens. I've talked to her about potential options and will start Complera once daily in the morning with food. I will also go and give her a prescription for Zofran to take in case she has any nausea.  Plan: 1. Start Complera 2. She knows to call me if she has any problems or concerns between now and her next visit 3. Followup after lab work in 6 weeks   Cliffton Asters, MD [facility] for Infectious Disease [facility] [phone] pager   [phone] cell 01/25/2013, 3:44 PM

## 2013-02-15 ENCOUNTER — Telehealth: Payer: Self-pay | Admitting: *Deleted

## 2013-02-15 ENCOUNTER — Ambulatory Visit: Payer: BC Managed Care – PPO

## 2013-02-15 NOTE — Telephone Encounter (Signed)
Left message requesting pt call for another PAP smear appt.

## 2013-04-21 ENCOUNTER — Encounter: Payer: Self-pay | Admitting: *Deleted

## 2013-06-02 ENCOUNTER — Telehealth: Payer: Self-pay | Admitting: *Deleted

## 2013-06-02 DIAGNOSIS — Z113 Encounter for screening for infections with a predominantly sexual mode of transmission: Secondary | ICD-10-CM

## 2013-06-02 NOTE — Telephone Encounter (Signed)
Requested pt call [facility] to schedule lab work, and MD f/u visits after starting new medication.

## 2013-06-29 ENCOUNTER — Telehealth: Payer: Self-pay | Admitting: Licensed Clinical Social Worker

## 2013-06-29 NOTE — Telephone Encounter (Signed)
Left message and asked patient to call to be scheduled for 9/15 with Dr. Orvan Falconer.

## 2013-07-12 ENCOUNTER — Other Ambulatory Visit: Payer: BC Managed Care – PPO

## 2013-07-12 DIAGNOSIS — Z113 Encounter for screening for infections with a predominantly sexual mode of transmission: Secondary | ICD-10-CM

## 2013-07-12 DIAGNOSIS — B20 Human immunodeficiency virus [HIV] disease: Secondary | ICD-10-CM

## 2013-07-12 LAB — CBC
HCT: 35.7 % — ABNORMAL LOW (ref 36.0–46.0)
Hemoglobin: 12.2 g/dL (ref 12.0–15.0)
MCH: 28.2 pg (ref 26.0–34.0)
MCHC: 34.2 g/dL (ref 30.0–36.0)
MCV: 82.6 fL (ref 78.0–100.0)
Platelets: 214 10*3/uL (ref 150–400)
RBC: 4.32 MIL/uL (ref 3.87–5.11)
RDW: 13.4 % (ref 11.5–15.5)
WBC: 3.9 10*3/uL — ABNORMAL LOW (ref 4.0–10.5)

## 2013-07-12 LAB — COMPREHENSIVE METABOLIC PANEL
ALT: 19 U/L (ref 0–35)
AST: 22 U/L (ref 0–37)
Albumin: 3.7 g/dL (ref 3.5–5.2)
Alkaline Phosphatase: 83 U/L (ref 39–117)
BUN: 11 mg/dL (ref 6–23)
CO2: 26 mEq/L (ref 19–32)
Calcium: 9.2 mg/dL (ref 8.4–10.5)
Chloride: 107 mEq/L (ref 96–112)
Creat: 0.95 mg/dL (ref 0.50–1.10)
Glucose, Bld: 100 mg/dL — ABNORMAL HIGH (ref 70–99)
Potassium: 3.8 mEq/L (ref 3.5–5.3)
Sodium: 140 mEq/L (ref 135–145)
Total Bilirubin: 0.7 mg/dL (ref 0.3–1.2)
Total Protein: 7.4 g/dL (ref 6.0–8.3)

## 2013-07-13 LAB — RPR

## 2013-07-14 LAB — HIV-1 RNA QUANT-NO REFLEX-BLD
HIV 1 RNA Quant: <20 copies/mL (ref <20)
HIV-1 RNA Quant, Log: <1.3 {Log} (ref <1.30)

## 2013-07-14 LAB — T-HELPER CELL (CD4) - (RCID CLINIC ONLY)
CD4 % Helper T Cell: 23 % — ABNORMAL LOW (ref 33–55)
CD4 T Cell Abs: 510 /uL (ref 400–2700)

## 2013-07-27 ENCOUNTER — Ambulatory Visit (INDEPENDENT_AMBULATORY_CARE_PROVIDER_SITE_OTHER): Payer: BC Managed Care – PPO | Admitting: Family Medicine

## 2013-07-27 ENCOUNTER — Encounter: Payer: Self-pay | Admitting: Family Medicine

## 2013-07-27 VITALS — BP 138/90 | HR 83 | Temp 98.4°F | Wt 184.8 lb

## 2013-07-27 DIAGNOSIS — J019 Acute sinusitis, unspecified: Secondary | ICD-10-CM

## 2013-07-27 MED ORDER — AMOXICILLIN-POT CLAVULANATE 875-125 MG PO TABS: 1.0000 | ORAL_TABLET | Freq: Two times a day (BID) | ORAL | Status: DC

## 2013-07-27 NOTE — Progress Notes (Signed)
  Subjective:     Stephanie Espinoza is a 57 y.o. female who presents for evaluation of sinus pain. Symptoms include: congestion, facial pain, nasal congestion, post nasal drip, sinus pressure and sore throat. Onset of symptoms was 5 days ago. Symptoms have been gradually worsening since that time. Past history is significant for no history of pneumonia or bronchitis. Patient is a non-smoker.  The following portions of the patient's history were reviewed and updated as appropriate: allergies, current medications, past family history, past medical history, past social history, past surgical history and problem list.  Review of Systems Pertinent items are noted in HPI.   Objective:    BP 138/90  Pulse 83  Temp(Src) 98.4 F (36.9 C) (Oral)  Wt 184 lb 12.8 oz (83.825 kg)  BMI 32.74 kg/m2  SpO2 97% General appearance: alert, cooperative, appears stated age and mild distress Ears: TM dull b/l  + fluid Nose: green discharge, moderate congestion, turbinates red, swollen, sinus tenderness bilateral Throat: abnormal findings: exudates present and marked oropharyngeal erythema Neck: moderate anterior cervical adenopathy, supple, symmetrical, trachea midline and thyroid not enlarged, symmetric, no tenderness/mass/nodules Lungs: clear to auscultation bilaterally Heart: S1, S2 normal    Assessment:    Acute bacterial sinusitis.    Plan:    Nasal steroids per medication orders. Antihistamines per medication orders. Augmentin per medication orders.

## 2013-07-27 NOTE — Patient Instructions (Addendum)

## 2013-08-19 ENCOUNTER — Encounter: Payer: Self-pay | Admitting: Family Medicine

## 2013-08-19 ENCOUNTER — Ambulatory Visit (INDEPENDENT_AMBULATORY_CARE_PROVIDER_SITE_OTHER): Payer: BC Managed Care – PPO | Admitting: Family Medicine

## 2013-08-19 VITALS — BP 140/86 | HR 86 | Temp 98.2°F | Resp 16 | Wt 183.5 lb

## 2013-08-19 DIAGNOSIS — S40029A Contusion of unspecified upper arm, initial encounter: Secondary | ICD-10-CM

## 2013-08-19 DIAGNOSIS — S40021A Contusion of right upper arm, initial encounter: Secondary | ICD-10-CM

## 2013-08-19 MED ORDER — NAPROXEN 500 MG PO TABS: 500.0000 mg | ORAL_TABLET | Freq: Two times a day (BID) | ORAL | Status: DC

## 2013-08-19 MED ORDER — CYCLOBENZAPRINE HCL 10 MG PO TABS: 10.0000 mg | ORAL_TABLET | Freq: Three times a day (TID) | ORAL | Status: DC | PRN

## 2013-08-19 NOTE — Patient Instructions (Signed)
Start the Naproxen twice daily w/ food x7 days and then as needed Tylenol in between for pain Muscle relaxer (flexeril) at night Alternate ice/heat This can take up to 6 weeks Hang in there!!!

## 2013-08-19 NOTE — Progress Notes (Signed)
  Subjective:    Patient ID: Stephanie Espinoza, female    DOB: December 15, 1955, 57 y.o.   MRN: [id number]  HPI R arm pain- pt developed muscle soreness the day after exercising 10 days ago.  Relative rest (no working out), icy hot w/out relief.  Soreness was keeping pt awake at night, painful to move.  Pain localized to upper arm.  Able to move shoulder and flex/extend elbow w/out difficulty.  Painful to lean on forearms or doing mouse work on the computer.     Review of Systems For ROS see HPI     Objective:   Physical Exam  Vitals reviewed. Constitutional: She appears well-developed and well-nourished. No distress.  Musculoskeletal:  Full ROM of R shoulder and elbow  Skin: Skin is warm and dry.  Extensive bruising over R lower upper and upper lower arm.  Very TTP.  No fluctuance or mass noted.          Assessment & Plan:

## 2013-08-19 NOTE — Assessment & Plan Note (Signed)
New.  Extensive upper arm bruising w/ TTP.  No evidence of elbow or shoulder pain.  Start scheduled NSAIDs and ice.  Reviewed supportive care and red flags that should prompt return.  Pt expressed understanding and is in agreement w/ plan.

## 2013-09-09 ENCOUNTER — Other Ambulatory Visit: Payer: Self-pay

## 2013-09-20 ENCOUNTER — Ambulatory Visit (INDEPENDENT_AMBULATORY_CARE_PROVIDER_SITE_OTHER): Payer: BC Managed Care – PPO | Admitting: Internal Medicine

## 2013-09-20 ENCOUNTER — Encounter: Payer: Self-pay | Admitting: Internal Medicine

## 2013-09-20 VITALS — BP 151/105 | HR 108 | Temp 98.2°F | Ht 62.5 in | Wt 183.0 lb

## 2013-09-20 DIAGNOSIS — B20 Human immunodeficiency virus [HIV] disease: Secondary | ICD-10-CM

## 2013-09-20 NOTE — Progress Notes (Signed)
Patient ID: Stephanie Espinoza, female   DOB: [birth year], 57 y.o.   MRN: [id number]          [facility] for Infectious Disease  Patient Active Problem List   Diagnosis Date Noted  . HIV DISEASE 09/06/2006    Priority: High  . Arm bruise 08/19/2013  . Acute serous otitis media 09/21/2012  . Vertigo, benign positional 09/21/2012  . OM (otitis media) 02/11/2012  . Migraine 12/25/2011  . UTI (lower urinary tract infection) 12/12/2011  . Shoulder pain 02/19/2011  . ASTHMA UNSPECIFIED WITH EXACERBATION 08/03/2009  . OTHER NEUTROPENIA 03/22/2009  . SINUSITIS - ACUTE-NOS 03/22/2009  . GENITAL HERPES 09/06/2006  . SYPHILIS NOS 09/06/2006  . ANXIETY 09/06/2006  . DEPRESSION 09/06/2006  . BELL'S PALSY 09/06/2006  . ALLERGIC RHINITIS 09/06/2006  . PAP SMEAR, ABNORMAL 09/06/2006    Patient's Medications  New Prescriptions   No medications on file  Previous Medications   ALBUTEROL (PROAIR HFA) 108 (90 BASE) MCG/ACT INHALER    Inhale 2 puffs into the lungs 4 (four) times daily as needed.     CYCLOBENZAPRINE (FLEXERIL) 10 MG TABLET    Take 1 tablet (10 mg total) by mouth 3 (three) times daily as needed for muscle spasms.   EMTRICITAB-RILPIVIR-TENOFOVIR 200-25-300 MG TABS    Take 1 tablet by mouth daily.   FEXOFENADINE (ALLEGRA) 180 MG TABLET    Take 180 mg by mouth daily.     FLUTICASONE (VERAMYST) 27.5 MCG/SPRAY NASAL SPRAY    2 sprays by Nasal route daily.     NAPROXEN (NAPROSYN) 500 MG TABLET    Take 1 tablet (500 mg total) by mouth 2 (two) times daily with a meal.   ONDANSETRON (ZOFRAN) 4 MG TABLET    Take 1 tablet (4 mg total) by mouth every 8 (eight) hours as needed for nausea.  Modified Medications   No medications on file  Discontinued Medications   AMOXICILLIN-CLAVULANATE (AUGMENTIN) 875-125 MG PER TABLET    Take 1 tablet by mouth 2 (two) times daily.    Subjective: Zayneb is in for a routine followup visit. She has not had any problems obtaining or tolerating her Complera and  does not recall missing any doses since her last visit. She is feeling well. She did have a scare with her daughter recently. Her daughter developed some visual disturbance and was found to have a stroke could do to venous thrombosis that was felt to be related to her oral contraceptive pills. She is now doing better on Coumadin therapy. Unfortunately she is now a legal battle with her ex-husband to try to get him to help with her medical bills. She received her influenza vaccine at work recently.  Review of Systems: Pertinent items are noted in HPI.  Past Medical History  Diagnosis Date  . Allergic rhinitis   . Anxiety   . Depression   . HIV (human immunodeficiency virus infection)   . Bell's palsy   . Genital herpes   . Syphilis   . History of drug abuse   . Abnormal finding on Pap smear     History  Substance Use Topics  . Smoking status: Never Smoker   . Smokeless tobacco: Never Used  . Alcohol Use: No    Family History  Problem Relation Age of Onset  . Diabetes Father     Allergies  Allergen Reactions  . Nevirapine     REACTION: Rash 10/08    Objective: Temp: 98.2 F (36.8 C) (11/17 1531)  Temp src: Oral (11/17 1531) BP: 151/105 mmHg (11/17 1538) Pulse Rate: 108 (11/17 1531)  Repeat blood pressure was 137/95  General: She is in very good spirits today Oral: No oropharyngeal lesions Skin: Or rash Lungs: Clear Cor: Regular S1 and S2 no murmurs  Lab Results HIV 1 RNA Quant (copies/mL)  Date Value  07/12/2013 <20   11/09/2012 405*  03/24/2012 25*     CD4 T Cell Abs (/uL)  Date Value  07/12/2013 510   11/09/2012 480   03/24/2012 440      Assessment: Orva's HIV infection is under better control at any time in the last 3 years do to improve the appearance. She is no longer reluctant to take antiretroviral medication.  Plan: 1. Continue Complera 2. Follow up after lab work in 6 months   Cliffton Asters, MD [facility] for Infectious Disease [facility] [phone] pager   [phone] cell 09/20/2013, 3:58 PM

## 2013-11-22 ENCOUNTER — Encounter: Payer: Self-pay | Admitting: *Deleted

## 2013-11-22 ENCOUNTER — Encounter: Payer: Self-pay | Admitting: Nurse Practitioner

## 2013-11-22 ENCOUNTER — Telehealth: Payer: Self-pay | Admitting: *Deleted

## 2013-11-22 ENCOUNTER — Ambulatory Visit (INDEPENDENT_AMBULATORY_CARE_PROVIDER_SITE_OTHER): Payer: BC Managed Care – PPO | Admitting: Nurse Practitioner

## 2013-11-22 VITALS — BP 112/64 | HR 63 | Temp 98.1°F | Ht 63.0 in | Wt 185.0 lb

## 2013-11-22 DIAGNOSIS — J329 Chronic sinusitis, unspecified: Secondary | ICD-10-CM

## 2013-11-22 MED ORDER — AZITHROMYCIN 250 MG PO TABS: ORAL_TABLET | ORAL | Status: DC

## 2013-11-22 NOTE — Progress Notes (Signed)
   Subjective:    Patient ID: Stephanie Espinoza, female    DOB: April 22, 1956, 58 y.o.   MRN: [id number]  HPI Comments: Pt is HIV pos, last WBC low, T cell cnt 500.  URI  This is a recurrent problem. The current episode started in the past 7 days (4d). The problem has been gradually worsening. There has been no fever (99.0). Associated symptoms include congestion, ear pain, headaches, a plugged ear sensation and a sore throat. Pertinent negatives include no abdominal pain, chest pain, coughing, diarrhea, nausea, sneezing or wheezing. Treatments tried: flonase. The treatment provided no relief.      Review of Systems  Constitutional: Positive for fever (low grade 99.0), chills and fatigue.  HENT: Positive for congestion, ear pain and sore throat. Negative for sneezing.   Respiratory: Negative for cough, chest tightness, shortness of breath and wheezing.        States feels like not breathing deeply.  Cardiovascular: Negative for chest pain.  Gastrointestinal: Negative for nausea, abdominal pain and diarrhea.  Musculoskeletal: Negative for back pain.  Neurological: Positive for headaches.  Hematological: Negative for adenopathy.       Objective:   Physical Exam  Vitals reviewed. Constitutional: She is oriented to person, place, and time. She appears well-developed and well-nourished. No distress.  HENT:  Head: Normocephalic and atraumatic.  Right Ear: External ear normal.  Left Ear: External ear normal.  Mouth/Throat: Oropharynx is clear and moist. No oropharyngeal exudate.  L TM vessels injected.  Eyes: Conjunctivae are normal. Pupils are equal, round, and reactive to light. Right eye exhibits no discharge. Left eye exhibits no discharge.  Neck: No thyromegaly present.  Cardiovascular: Normal rate, regular rhythm and normal heart sounds.   No murmur heard. Pulmonary/Chest: Effort normal and breath sounds normal. No respiratory distress. She has no wheezes.  Lymphadenopathy:    She has  no cervical adenopathy.  Neurological: She is alert and oriented to person, place, and time.  Skin: Skin is warm and dry.  Psychiatric: She has a normal mood and affect. Her behavior is normal. Thought content normal.          Assessment & Plan:  1. Unspecified sinusitis (chronic) Likely viral illness.[name] HIV pos. Last CD 4 cnt 500., WBC 3. - azithromycin (ZITHROMAX) 250 MG tablet; Take 2 tabs po on day 1, then 1 T po qd on days 2-5.  Dispense: 6 each; Refill: 0  See pt instructions.

## 2013-11-22 NOTE — Patient Instructions (Signed)
Your symptoms may be caused by a virus and will improve without medication in the next 4-5 days. However, I have prescribed azithromycin/z pack which you may start. Continue daily sinus rinses. You may use pseudoephedrine 30 mg twice daily for 4-6 days. Ibuprophen works well for ear pain. Please call for re-evaluation if you are not improving. Feel better!  Sinusitis Sinusitis is redness, soreness, and swelling (inflammation) of the paranasal sinuses. Paranasal sinuses are air pockets within the bones of your face (beneath the eyes, the middle of the forehead, or above the eyes). In healthy paranasal sinuses, mucus is able to drain out, and air is able to circulate through them by way of your nose. However, when your paranasal sinuses are inflamed, mucus and air can become trapped. This can allow bacteria and other germs to grow and cause infection. Sinusitis can develop quickly and last only a short time (acute) or continue over a long period (chronic). Sinusitis that lasts for more than 12 weeks is considered chronic.  CAUSES  Causes of sinusitis include:  Allergies.  Structural abnormalities, such as displacement of the cartilage that separates your nostrils (deviated septum), which can decrease the air flow through your nose and sinuses and affect sinus drainage.  Functional abnormalities, such as when the small hairs (cilia) that line your sinuses and help remove mucus do not work properly or are not present. SYMPTOMS  Symptoms of acute and chronic sinusitis are the same. The primary symptoms are pain and pressure around the affected sinuses. Other symptoms include:  Upper toothache.  Earache.  Headache.  Bad breath.  Decreased sense of smell and taste.  A cough, which worsens when you are lying flat.  Fatigue.  Fever.  Thick drainage from your nose, which often is green and may contain pus (purulent).  Swelling and warmth over the affected sinuses. DIAGNOSIS  Your caregiver  will perform a physical exam. During the exam, your caregiver may:  Look in your nose for signs of abnormal growths in your nostrils (nasal polyps).  Tap over the affected sinus to check for signs of infection.  View the inside of your sinuses (endoscopy) with a special imaging device with a light attached (endoscope), which is inserted into your sinuses. If your caregiver suspects that you have chronic sinusitis, one or more of the following tests may be recommended:  Allergy tests.  Nasal culture A sample of mucus is taken from your nose and sent to a lab and screened for bacteria.  Nasal cytology A sample of mucus is taken from your nose and examined by your caregiver to determine if your sinusitis is related to an allergy. TREATMENT  Most cases of acute sinusitis are related to a viral infection and will resolve on their own within 10 days. Sometimes medicines are prescribed to help relieve symptoms (pain medicine, decongestants, nasal steroid sprays, or saline sprays).  However, for sinusitis related to a bacterial infection, your caregiver will prescribe antibiotic medicines. These are medicines that will help kill the bacteria causing the infection.  Rarely, sinusitis is caused by a fungal infection. In theses cases, your caregiver will prescribe antifungal medicine. For some cases of chronic sinusitis, surgery is needed. Generally, these are cases in which sinusitis recurs more than 3 times per year, despite other treatments. HOME CARE INSTRUCTIONS   Drink plenty of water. Water helps thin the mucus so your sinuses can drain more easily.  Use a humidifier.  Inhale steam 3 to 4 times a day (for example,  sit in the bathroom with the shower running).  Apply a warm, moist washcloth to your face 3 to 4 times a day, or as directed by your caregiver.  Use saline nasal sprays to help moisten and clean your sinuses.  Take over-the-counter or prescription medicines for pain, discomfort,  or fever only as directed by your caregiver. SEEK IMMEDIATE MEDICAL CARE IF:  You have increasing pain or severe headaches.  You have nausea, vomiting, or drowsiness.  You have swelling around your face.  You have vision problems.  You have a stiff neck.  You have difficulty breathing. MAKE SURE YOU:   Understand these instructions.  Will watch your condition.  Will get help right away if you are not doing well or get worse. Document Released: 10/21/2005 Document Revised: 01/13/2012 Document Reviewed: 11/05/2011 [facility] Patient Information 2014 [place], [place].

## 2013-11-22 NOTE — Telephone Encounter (Signed)
Patient called and stated that she is experiencing a stuffy nose, low grade fever, sore throat. Patient states that she would like to see a provider. Patient was scheduled an appointment at [facility] with Nicky Pugh. SW

## 2013-11-22 NOTE — Progress Notes (Signed)
Pre-visit discussion using our clinic review tool. No additional management support is needed unless otherwise documented below in the visit note.  

## 2013-11-23 ENCOUNTER — Telehealth: Payer: Self-pay | Admitting: General Practice

## 2013-11-23 NOTE — Telephone Encounter (Signed)
Message on Triage Line: Pt called and LM on triage line stating that she needed an appt to see Tabori due to Congestion, HA, chills, Fever, and no appetite. Pt was just seen on 1/19 by Nicky Pugh and given a z-pak for sinusitis. Please advise if pt needs an appt to rule out flu?

## 2013-11-24 ENCOUNTER — Encounter: Payer: Self-pay | Admitting: Family Medicine

## 2013-11-24 ENCOUNTER — Ambulatory Visit (INDEPENDENT_AMBULATORY_CARE_PROVIDER_SITE_OTHER): Payer: BC Managed Care – PPO | Admitting: Family Medicine

## 2013-11-24 VITALS — BP 140/90 | HR 101 | Temp 98.9°F | Resp 16 | Wt 182.5 lb

## 2013-11-24 DIAGNOSIS — J320 Chronic maxillary sinusitis: Secondary | ICD-10-CM | POA: Insufficient documentation

## 2013-11-24 DIAGNOSIS — R6889 Other general symptoms and signs: Secondary | ICD-10-CM

## 2013-11-24 DIAGNOSIS — J329 Chronic sinusitis, unspecified: Secondary | ICD-10-CM

## 2013-11-24 LAB — POCT INFLUENZA A/B
Influenza A, POC: NEGATIVE
Influenza B, POC: NEGATIVE

## 2013-11-24 MED ORDER — AMOXICILLIN 875 MG PO TABS: 875.0000 mg | ORAL_TABLET | Freq: Two times a day (BID) | ORAL | Status: DC

## 2013-11-24 NOTE — Progress Notes (Signed)
   Subjective:    Patient ID: Stephanie Espinoza, female    DOB: Apr 12, 1956, 58 y.o.   MRN: [id number]  HPI URI- pt was seen on Monday 1/19 for URI and started on Zpack.  + body aches, HA, nasal congestion, sore throat, bilateral ear fullness, facial pain/pressure.  No tooth pain.  Painful to bend forward.  + low grade temps, chills.  No N/V/D.  No known sick contacts   Review of Systems For ROS see HPI     Objective:   Physical Exam  Vitals reviewed. Constitutional: She appears well-developed and well-nourished. No distress.  HENT:  Head: Normocephalic and atraumatic.  Right Ear: Tympanic membrane normal.  Left Ear: Tympanic membrane normal.  Nose: Mucosal edema and rhinorrhea present. Right sinus exhibits maxillary sinus tenderness. Right sinus exhibits no frontal sinus tenderness. Left sinus exhibits maxillary sinus tenderness. Left sinus exhibits no frontal sinus tenderness.  Mouth/Throat: Uvula is midline and mucous membranes are normal. Posterior oropharyngeal erythema present. No oropharyngeal exudate.  Eyes: Conjunctivae and EOM are normal. Pupils are equal, round, and reactive to light.  Neck: Normal range of motion. Neck supple.  Cardiovascular: Normal rate, regular rhythm and normal heart sounds.   Pulmonary/Chest: Effort normal and breath sounds normal. No respiratory distress. She has no wheezes.  Lymphadenopathy:    She has no cervical adenopathy.          Assessment & Plan:

## 2013-11-24 NOTE — Telephone Encounter (Signed)
Pt can either have 2nd appt to assess for flu or given medication a chance to work since it has only been 1 day since she received prescription

## 2013-11-24 NOTE — Assessment & Plan Note (Signed)
New.  No evidence of flu.  Stop Zpack due to ineffectiveness.  Start high dose Amox.  Reviewed supportive care and red flags that should prompt return.  Pt expressed understanding and is in agreement w/ plan.

## 2013-11-24 NOTE — Telephone Encounter (Signed)
Pt scheduled for appt today.

## 2013-11-24 NOTE — Progress Notes (Signed)
Pre visit review using our clinic review tool, if applicable. No additional management support is needed unless otherwise documented below in the visit note. 

## 2013-11-24 NOTE — Patient Instructions (Signed)
Follow up as needed Start the Amoxicillin twice daily- take w/ food Drink plenty of fluids REST! Call with any questions or concerns Hang in there!!! 

## 2014-02-16 ENCOUNTER — Encounter: Payer: Self-pay | Admitting: Internal Medicine

## 2014-02-19 ENCOUNTER — Ambulatory Visit (INDEPENDENT_AMBULATORY_CARE_PROVIDER_SITE_OTHER): Payer: BC Managed Care – PPO | Admitting: Family Medicine

## 2014-02-19 ENCOUNTER — Encounter: Payer: Self-pay | Admitting: Family Medicine

## 2014-02-19 VITALS — BP 130/90 | HR 89 | Temp 97.6°F | Wt 185.2 lb

## 2014-02-19 DIAGNOSIS — J329 Chronic sinusitis, unspecified: Secondary | ICD-10-CM

## 2014-02-19 DIAGNOSIS — J320 Chronic maxillary sinusitis: Secondary | ICD-10-CM

## 2014-02-19 MED ORDER — AZITHROMYCIN 250 MG PO TABS: ORAL_TABLET | ORAL | Status: DC

## 2014-02-19 NOTE — Assessment & Plan Note (Signed)
Given duration and progression of symptoms, will treat for bacterial sinusitis. Suggested high dose amoxicillin but she felt this does not work well for her.  Requested Zpack.  Zpack rx sent to pharmacy.  I advised adding nasocort. Call or return to PCP prn if these symptoms worsen or fail to improve as anticipated. The patient indicates understanding of these issues and agrees with the plan.

## 2014-02-19 NOTE — Progress Notes (Signed)
Pre visit review using our clinic review tool, if applicable. No additional management support is needed unless otherwise documented below in the visit note. 

## 2014-02-19 NOTE — Progress Notes (Signed)
Subjective:   Patient ID: Stephanie Espinoza, female    DOB: 10-27-1956, 58 y.o.   MRN: [id number]  Stephanie Espinoza is a pleasant 58 y.o. year old female new to me who presents to Saturday clinic today with Nasal Congestion  on 02/19/2014  HPI: Over 10 days of worsening congestion and sinus pressure.  Does have seasonal allergies but symptoms progressing despite taking daily Claritin D.  Cough is becoming more productive. No fevers but she has had chills.  +fatigue, decreased appetite.  Patient Active Problem List   Diagnosis Date Noted  . Maxillary sinusitis 11/24/2013  . Arm bruise 08/19/2013  . Acute serous otitis media 09/21/2012  . Vertigo, benign positional 09/21/2012  . OM (otitis media) 02/11/2012  . Migraine 12/25/2011  . UTI (lower urinary tract infection) 12/12/2011  . Shoulder pain 02/19/2011  . ASTHMA UNSPECIFIED WITH EXACERBATION 08/03/2009  . OTHER NEUTROPENIA 03/22/2009  . SINUSITIS - ACUTE-NOS 03/22/2009  . HIV DISEASE 09/06/2006  . GENITAL HERPES 09/06/2006  . SYPHILIS NOS 09/06/2006  . ANXIETY 09/06/2006  . DEPRESSION 09/06/2006  . BELL'S PALSY 09/06/2006  . ALLERGIC RHINITIS 09/06/2006  . PAP SMEAR, ABNORMAL 09/06/2006   Past Medical History  Diagnosis Date  . Allergic rhinitis   . Anxiety   . Depression   . HIV (human immunodeficiency virus infection)   . Bell's palsy   . Genital herpes   . Syphilis   . History of drug abuse   . Abnormal finding on Pap smear    Past Surgical History  Procedure Laterality Date  . Cesarean section  1997   History  Substance Use Topics  . Smoking status: Never Smoker   . Smokeless tobacco: Never Used  . Alcohol Use: No   Family History  Problem Relation Age of Onset  . Diabetes Father    Allergies  Allergen Reactions  . Nevirapine     REACTION: Rash 10/08   Current Outpatient Prescriptions on File Prior to Visit  Medication Sig Dispense Refill  . albuterol (PROAIR HFA) 108 (90 BASE) MCG/ACT inhaler Inhale 2  puffs into the lungs 4 (four) times daily as needed.        . cyclobenzaprine (FLEXERIL) 10 MG tablet Take 1 tablet (10 mg total) by mouth 3 (three) times daily as needed for muscle spasms.  45 tablet  0  . Emtricitab-Rilpivir-Tenofovir 200-25-300 MG TABS Take 1 tablet by mouth daily.  30 tablet  11  . fexofenadine (ALLEGRA) 180 MG tablet Take 180 mg by mouth daily.        . fluticasone (VERAMYST) 27.5 MCG/SPRAY nasal spray 2 sprays by Nasal route daily.        . naproxen (NAPROSYN) 500 MG tablet Take 1 tablet (500 mg total) by mouth 2 (two) times daily with a meal.  60 tablet  0   No current facility-administered medications on file prior to visit.   The PMH, PSH, Social History, Family History, Medications, and allergies have been reviewed in [facility], and have been updated if relevant.   Review of Systems    See HPI Some mild DOE  Objective:    BP 130/90  Pulse 89  Temp(Src) 97.6 F (36.4 C) (Oral)  Wt 185 lb 4 oz (84.029 kg)  SpO2 97%   Physical Exam   Vitals reviewed.  Constitutional: She appears well-developed and well-nourished. No distress.  HENT:  Head: Normocephalic and atraumatic.  Right Ear: Tympanic membrane normal.  Left Ear: Tympanic membrane normal.  Nose:  Mucosal edema and rhinorrhea present. Right sinus exhibits maxillary sinus tenderness and frontal sinus tenderness. Left sinus exhibits maxillary sinus tenderness and frontal sinus tenderness.  Mouth/Throat: Uvula is midline and mucous membranes are normal. Posterior oropharyngeal erythema present. No oropharyngeal exudate.  Eyes: Conjunctivae normal and EOM are normal. Pupils are equal, round, and reactive to light.  Neck: Normal range of motion. Neck supple.  Cardiovascular: Normal rate, regular rhythm and normal heart sounds.  Pulmonary/Chest: Effort normal and breath sounds normal. No respiratory distress. She has no wheezes.  Lymphadenopathy:  She has no cervical adenopathy.       Assessment & Plan:    No diagnosis found. No Follow-up on file.

## 2014-02-19 NOTE — Patient Instructions (Signed)
Take Zpack as directed.  Drink lots of fluids.   Treat sympotmatically with Mucinex, nasal saline irrigation, and Tylenol/Ibuprofen.   Try over the counter nasocort-start with 2 sprays per nostril per day...and then try to taper to 1 spray per nostril once symptoms improve.   You can use warm compresses.     Call your doctor if not improving as expected in 5-7 days.

## 2014-03-01 ENCOUNTER — Encounter: Payer: Self-pay | Admitting: Nurse Practitioner

## 2014-03-01 ENCOUNTER — Ambulatory Visit (INDEPENDENT_AMBULATORY_CARE_PROVIDER_SITE_OTHER): Payer: BC Managed Care – PPO | Admitting: Nurse Practitioner

## 2014-03-01 VITALS — BP 140/90 | HR 82 | Ht 62.0 in | Wt 184.4 lb

## 2014-03-01 DIAGNOSIS — D2272 Melanocytic nevi of left lower limb, including hip: Secondary | ICD-10-CM

## 2014-03-01 DIAGNOSIS — B20 Human immunodeficiency virus [HIV] disease: Secondary | ICD-10-CM

## 2014-03-01 DIAGNOSIS — Z113 Encounter for screening for infections with a predominantly sexual mode of transmission: Secondary | ICD-10-CM

## 2014-03-01 DIAGNOSIS — F1911 Other psychoactive substance abuse, in remission: Secondary | ICD-10-CM

## 2014-03-01 DIAGNOSIS — A539 Syphilis, unspecified: Secondary | ICD-10-CM

## 2014-03-01 DIAGNOSIS — F1991 Other psychoactive substance use, unspecified, in remission: Secondary | ICD-10-CM

## 2014-03-01 DIAGNOSIS — D2370 Other benign neoplasm of skin of unspecified lower limb, including hip: Secondary | ICD-10-CM

## 2014-03-01 DIAGNOSIS — Z0000 Encounter for general adult medical examination without abnormal findings: Secondary | ICD-10-CM

## 2014-03-01 DIAGNOSIS — Z87898 Personal history of other specified conditions: Secondary | ICD-10-CM

## 2014-03-01 DIAGNOSIS — Z01419 Encounter for gynecological examination (general) (routine) without abnormal findings: Secondary | ICD-10-CM

## 2014-03-01 LAB — POCT URINALYSIS DIPSTICK
Bilirubin, UA: NEGATIVE
Blood, UA: NEGATIVE
Glucose, UA: NEGATIVE
Ketones, UA: NEGATIVE
Leukocytes, UA: NEGATIVE
Nitrite, UA: NEGATIVE
Protein, UA: NEGATIVE
Urobilinogen, UA: NEGATIVE
pH, UA: 5

## 2014-03-01 NOTE — Progress Notes (Signed)
Patient ID: Stephanie Espinoza, female   DOB: 1956/01/21, 58 y.o.   MRN: [id number] 58 y.o. [id number] Divorced African American Fe here as a NGYN  annual exam.  Same relationship for a year ( he is actually her first husband and lives in [place]).   Does not use condoms and no concern on his behalf of  getting HIV.  Married for 15 years and previous husband did not use condoms and did not get HIV.  She is not very focused on past health issues and states she only wants to do things for current well being. " She only lives one day at time".  Patient's last menstrual period was 11/04/1997.          Sexually active: yes  The current method of family planning is post menopausal status.    Exercising: no  The patient does not participate in regular exercise at present. Smoker:  no  Health Maintenance: Pap:  At least 2 years ago per patient MMG:  Never  Colonoscopy:  Never  BMD:   Never  TDaP:  Childhood  Labs: PCP  Urine:  normal   reports that she quit smoking about 24 years ago. Her smoking use included Cigarettes. She smoked 0.00 packs per day. She has never used smokeless tobacco. She reports that she does not drink alcohol or use illicit drugs.  Past Medical History  Diagnosis Date  . Allergic rhinitis   . Anxiety   . Depression   . HIV (human immunodeficiency virus infection)   . Bell's palsy after 1995 ?    no residual SE.  [name] History of drug abuse     drug usser [place].  cocaine  . Abnormal finding on Pap smear before 1995    always a repeat was back to normal, colpo biopsy done 1992 which was nl.  . Genital herpes   . Syphilis   . HIV (human immunodeficiency virus infection) 1990    Past Surgical History  Procedure Laterality Date  . Cesarean section  1997    Current Outpatient Prescriptions  Medication Sig Dispense Refill  . albuterol (PROAIR HFA) 108 (90 BASE) MCG/ACT inhaler Inhale 2 puffs into the lungs 4 (four) times daily as needed.        . cyclobenzaprine (FLEXERIL)  10 MG tablet Take 1 tablet (10 mg total) by mouth 3 (three) times daily as needed for muscle spasms.  45 tablet  0  . Emtricitab-Rilpivir-Tenofovir 200-25-300 MG TABS Take 1 tablet by mouth daily.  30 tablet  11  . fexofenadine (ALLEGRA) 180 MG tablet Take 180 mg by mouth daily.        . fluticasone (VERAMYST) 27.5 MCG/SPRAY nasal spray 2 sprays by Nasal route daily.        [name] loratadine-pseudoephedrine (CLARITIN-D 24-HOUR) 10-240 MG per 24 hr tablet Take 1 tablet by mouth daily as needed for allergies.       No current facility-administered medications for this visit.    Family History  Problem Relation Age of Onset  . Diabetes Father     ROS:  Pertinent items are noted in HPI.  Otherwise, a comprehensive ROS was negative.  Exam:   BP 140/90  Pulse 82  Ht 5\' 2"  (1.575 m)  Wt 184 lb 6.4 oz (83.643 kg)  BMI 33.72 kg/m2  LMP 11/04/1997 Height: 5\' 2"  (157.5 cm)  Ht Readings from Last 3 Encounters:  03/01/14 5\' 2"  (1.575 m)  11/22/13 5\' 3"  (1.6 m)  09/20/13  5' 2.5" (1.588 m)    General appearance: alert, cooperative and appears stated age Head: Normocephalic, without obvious abnormality, atraumatic Neck: no adenopathy, supple, symmetrical, trachea midline and thyroid normal to inspection and palpation Lungs: clear to auscultation bilaterally Breasts: normal appearance, no masses or tenderness Heart: regular rate and rhythm Abdomen: soft, non-tender; no masses,  no organomegaly Extremities: extremities normal, atraumatic, no cyanosis or edema Skin: Skin color, texture, turgor normal. No rashes or lesions.  There is a birth mark inner left thigh close to the knee.  There is a mole that has been "present for a long time" per patient.  It is very dark and irregular in size and shape. Lymph nodes: Cervical, supraclavicular, and axillary nodes normal. No abnormal inguinal nodes palpated Neurologic: Grossly normal   Pelvic: External genitalia:  no lesions              Urethra:  normal  appearing urethra with no masses, tenderness or lesions              Bartholin's and Skene's: normal                 Vagina: normal appearing vagina with normal color and discharge, no lesions              Cervix: anteverted              Pap taken: yes Bimanual Exam:  Uterus:  normal size, contour, position, consistency, mobility, non-tender              Adnexa: no mass, fullness, tenderness               Rectovaginal: Confirms               Anus:  normal sphincter tone, no lesions  A:  Well Woman with normal exam  Postmenopausal no HRT  Changes in mole/ nevus left inner leg  History of HIV since 1990, Syphilis, history of drug use   Remote history of abnormal pap with colpo biopsy that was normal 1992  Elevated BP - declines to evaluate with PCP - could also be related to cold/ allergy med's.    P:   Reviewed health and wellness pertinent to exam  Pap smear taken today  Mammogram - strongly advised screening - pt Declines  Colonoscopy - strongly advised screening - pt. Declines  TDaP - advised updating - pt. Declines  Will make appointment with dermatologist - and hopes that she keeps this appointment.  Counseled on breast self exam, mammography screening, STD prevention, HIV risk factors and prevention using condoms, menopause, adequate intake of calcium and vitamin D, diet and exercise, Kegel's exercises return annually or prn  An After Visit Summary was printed and given to the patient.

## 2014-03-01 NOTE — Patient Instructions (Signed)

## 2014-03-03 LAB — IPS N GONORRHOEA AND CHLAMYDIA BY PCR

## 2014-03-03 LAB — IPS PAP TEST WITH HPV

## 2014-03-03 NOTE — Progress Notes (Signed)
Encounter reviewed by Dr. Tareek Sabo Silva.  

## 2014-03-09 ENCOUNTER — Telehealth: Payer: Self-pay | Admitting: Nurse Practitioner

## 2014-03-09 DIAGNOSIS — Z1211 Encounter for screening for malignant neoplasm of colon: Secondary | ICD-10-CM

## 2014-03-09 NOTE — Telephone Encounter (Signed)
I have looked at her chart and pap and you are correct - she does not need colpo biopsy - wonder if she meant colonosocpy - even though while here she declines.  See if she can give you more info and is she talking about cervix??

## 2014-03-09 NOTE — Telephone Encounter (Signed)
Patient calling to check on status of precert for colposcopy.

## 2014-03-10 NOTE — Telephone Encounter (Signed)
Patty, do you want to refer her to Dr. Collene Mares?

## 2014-03-10 NOTE — Telephone Encounter (Signed)
Called and spoke with patient to confirm type of appointment needed. Patient confirmed she did decide to have a colonoscopy once she left for her visit. I apologized for the confusion. Patient is understanding.

## 2014-03-10 NOTE — Telephone Encounter (Signed)
Yes Dr. Collene Mares is great!!

## 2014-03-11 NOTE — Telephone Encounter (Signed)
Referral place to Dr.Mann for colonoscopy. Sabrina notified.

## 2014-03-17 ENCOUNTER — Telehealth: Payer: Self-pay | Admitting: Nurse Practitioner

## 2014-03-17 NOTE — Telephone Encounter (Deleted)
Per fax received from Dr Manns office, patient is scheduled 05.28.2015 @ 1400. Patient has been advised. °

## 2014-03-17 NOTE — Telephone Encounter (Signed)
Per fax received from Dr Youlanda Mighty office, patient is scheduled 05.28.2015 @ 1400. Patient has been advised.

## 2014-03-17 NOTE — Telephone Encounter (Signed)
Per fax received from Dr Manns office, patient is scheduled 05.28.2015 @ 1400. Patient has been advised. °

## 2014-03-21 ENCOUNTER — Other Ambulatory Visit: Payer: BC Managed Care – PPO

## 2014-03-22 ENCOUNTER — Other Ambulatory Visit (INDEPENDENT_AMBULATORY_CARE_PROVIDER_SITE_OTHER): Payer: BC Managed Care – PPO

## 2014-03-22 DIAGNOSIS — B20 Human immunodeficiency virus [HIV] disease: Secondary | ICD-10-CM

## 2014-03-22 DIAGNOSIS — Z79899 Other long term (current) drug therapy: Secondary | ICD-10-CM

## 2014-03-22 DIAGNOSIS — Z113 Encounter for screening for infections with a predominantly sexual mode of transmission: Secondary | ICD-10-CM

## 2014-03-22 LAB — CBC
HCT: 35.1 % — ABNORMAL LOW (ref 36.0–46.0)
Hemoglobin: 11.4 g/dL — ABNORMAL LOW (ref 12.0–15.0)
MCH: 27 pg (ref 26.0–34.0)
MCHC: 32.5 g/dL (ref 30.0–36.0)
MCV: 83.2 fL (ref 78.0–100.0)
Platelets: 222 10*3/uL (ref 150–400)
RBC: 4.22 MIL/uL (ref 3.87–5.11)
RDW: 14.2 % (ref 11.5–15.5)
WBC: 4.5 10*3/uL (ref 4.0–10.5)

## 2014-03-22 LAB — LIPID PANEL
Cholesterol: 158 mg/dL (ref 0–200)
HDL: 37 mg/dL — ABNORMAL LOW (ref >39)
LDL Cholesterol: 88 mg/dL (ref 0–99)
Total CHOL/HDL Ratio: 4.3 Ratio
Triglycerides: 165 mg/dL — ABNORMAL HIGH (ref <150)
VLDL: 33 mg/dL (ref 0–40)

## 2014-03-22 LAB — COMPREHENSIVE METABOLIC PANEL
ALT: 14 U/L (ref 0–35)
AST: 17 U/L (ref 0–37)
Albumin: 3.6 g/dL (ref 3.5–5.2)
Alkaline Phosphatase: 83 U/L (ref 39–117)
BUN: 11 mg/dL (ref 6–23)
CO2: 26 mEq/L (ref 19–32)
Calcium: 8.8 mg/dL (ref 8.4–10.5)
Chloride: 107 mEq/L (ref 96–112)
Creat: 0.91 mg/dL (ref 0.50–1.10)
Glucose, Bld: 104 mg/dL — ABNORMAL HIGH (ref 70–99)
Potassium: 3.7 mEq/L (ref 3.5–5.3)
Sodium: 139 mEq/L (ref 135–145)
Total Bilirubin: 0.6 mg/dL (ref 0.2–1.2)
Total Protein: 7.6 g/dL (ref 6.0–8.3)

## 2014-03-22 LAB — RPR

## 2014-03-23 LAB — HIV-1 RNA QUANT-NO REFLEX-BLD
HIV 1 RNA Quant: <20 copies/mL (ref <20)
HIV-1 RNA Quant, Log: <1.3 {Log} (ref <1.30)

## 2014-03-23 LAB — T-HELPER CELL (CD4) - (RCID CLINIC ONLY)
CD4 % Helper T Cell: 20 % — ABNORMAL LOW (ref 33–55)
CD4 T Cell Abs: 540 /uL (ref 400–2700)

## 2014-04-04 ENCOUNTER — Ambulatory Visit (INDEPENDENT_AMBULATORY_CARE_PROVIDER_SITE_OTHER): Payer: BC Managed Care – PPO | Admitting: Internal Medicine

## 2014-04-04 ENCOUNTER — Encounter: Payer: Self-pay | Admitting: Internal Medicine

## 2014-04-04 VITALS — BP 133/90 | HR 96 | Temp 98.2°F | Wt 185.5 lb

## 2014-04-04 DIAGNOSIS — A6000 Herpesviral infection of urogenital system, unspecified: Secondary | ICD-10-CM

## 2014-04-04 DIAGNOSIS — B20 Human immunodeficiency virus [HIV] disease: Secondary | ICD-10-CM

## 2014-04-04 MED ORDER — VALACYCLOVIR HCL 500 MG PO TABS: ORAL_TABLET | ORAL | Status: DC

## 2014-04-04 NOTE — Progress Notes (Signed)
Patient ID: Stephanie Espinoza, female   DOB: [birth year], 58 y.o.   MRN: [id number]          Patient Active Problem List   Diagnosis Date Noted  . HIV DISEASE 09/06/2006    Priority: High  . GENITAL HERPES 09/06/2006    Priority: Medium  . Maxillary sinusitis 11/24/2013  . Arm bruise 08/19/2013  . Acute serous otitis media 09/21/2012  . Vertigo, benign positional 09/21/2012  . OM (otitis media) 02/11/2012  . Migraine 12/25/2011  . UTI (lower urinary tract infection) 12/12/2011  . Shoulder pain 02/19/2011  . ASTHMA UNSPECIFIED WITH EXACERBATION 08/03/2009  . OTHER NEUTROPENIA 03/22/2009  . SINUSITIS - ACUTE-NOS 03/22/2009  . SYPHILIS NOS 09/06/2006  . ANXIETY 09/06/2006  . DEPRESSION 09/06/2006  . BELL'S PALSY 09/06/2006  . ALLERGIC RHINITIS 09/06/2006  . PAP SMEAR, ABNORMAL 09/06/2006    Patient's Medications  New Prescriptions   VALACYCLOVIR (VALTREX) 500 MG TABLET    Type 2 times daily by mouth for 5 days  Previous Medications   ALBUTEROL (PROAIR HFA) 108 (90 BASE) MCG/ACT INHALER    Inhale 2 puffs into the lungs 4 (four) times daily as needed.     CYCLOBENZAPRINE (FLEXERIL) 10 MG TABLET    Take 1 tablet (10 mg total) by mouth 3 (three) times daily as needed for muscle spasms.   EMTRICITAB-RILPIVIR-TENOFOVIR 200-25-300 MG TABS    Take 1 tablet by mouth daily.   FEXOFENADINE (ALLEGRA) 180 MG TABLET    Take 180 mg by mouth daily.     FLUTICASONE (VERAMYST) 27.5 MCG/SPRAY NASAL SPRAY    2 sprays by Nasal route daily.     LORATADINE-PSEUDOEPHEDRINE (CLARITIN-D 24-HOUR) 10-240 MG PER 24 HR TABLET    Take 1 tablet by mouth daily as needed for allergies.  Modified Medications   No medications on file  Discontinued Medications   No medications on file    Subjective: Stephanie Espinoza is in for her routine visit. She has had no problems obtaining or tolerating Complera and does not believe she has missed any doses. Her teenage daughter will be graduating high school in 2 weeks then going on to  [employer] in the fall. Her son, who is 10, recently had 3 surgeries for a pituitary tumor. He is on some medication to shrink the tumor and has been having some symptoms but did not want to go back and see his physician. She has been concerned about that.  Review of Systems: Pertinent items are noted in HPI.  Past Medical History  Diagnosis Date  . Allergic rhinitis   . Anxiety   . Depression   . Bell's palsy after 1995 ?    no residual SE.  [name] History of drug abuse     drug usser [place].  cocaine  . Genital herpes   . Syphilis   . HIV (human immunodeficiency virus infection) 1990    from sexual contact who is unknown  . Abnormal Pap smear of cervix before 1995    always a repeat back to normal except for 1 time and had colpo bioosy -resutls were normal    History  Substance Use Topics  . Smoking status: Former Smoker    Types: Cigarettes    Quit date: 03/01/1990  . Smokeless tobacco: Never Used  . Alcohol Use: No    Family History  Problem Relation Age of Onset  . Diabetes Father     Allergies  Allergen Reactions  . Nevirapine  REACTION: Rash 10/08    Objective: Temp: 98.2 F (36.8 C) (06/01 1558) Temp src: Oral (06/01 1558) BP: 133/90 mmHg (06/01 1558) Pulse Rate: 96 (06/01 1558) Body mass index is 33.92 kg/(m^2).  General: She is in good spirits Oral: No oropharyngeal lesions Skin: No rash Lungs: Clear Cor: Regular S1 and S2 with no murmur   Lab Results Lab Results  Component Value Date   WBC 4.5 03/22/2014   HGB 11.4* 03/22/2014   HCT 35.1* 03/22/2014   MCV 83.2 03/22/2014   PLT 222 03/22/2014    Lab Results  Component Value Date   CREATININE 0.91 03/22/2014   BUN 11 03/22/2014   NA 139 03/22/2014   K 3.7 03/22/2014   CL 107 03/22/2014   CO2 26 03/22/2014    Lab Results  Component Value Date   ALT 14 03/22/2014   AST 17 03/22/2014   ALKPHOS 83 03/22/2014   BILITOT 0.6 03/22/2014    Lab Results  Component Value Date    CHOL 158 03/22/2014   HDL 37* 03/22/2014   LDLCALC 88 03/22/2014   LDLDIRECT 102.1 03/02/2009   TRIG 165* 03/22/2014   CHOLHDL 4.3 03/22/2014    Lab Results HIV 1 RNA Quant (copies/mL)  Date Value  03/22/2014 <20   07/12/2013 <20   11/09/2012 405*     CD4 T Cell Abs (/uL)  Date Value  03/22/2014 540   07/12/2013 510   11/09/2012 480      Assessment: Her HIV infection remains under excellent control.  Plan: 1. Continue Complera 2. Follow up after lab work in [place] months   Michel Bickers, MD [facility] [phone] pager   [phone] cell 04/04/2014, 4:24 PM

## 2014-04-21 ENCOUNTER — Ambulatory Visit: Payer: BC Managed Care – PPO | Admitting: Family Medicine

## 2014-04-21 DIAGNOSIS — Z0289 Encounter for other administrative examinations: Secondary | ICD-10-CM

## 2014-05-05 ENCOUNTER — Ambulatory Visit (INDEPENDENT_AMBULATORY_CARE_PROVIDER_SITE_OTHER): Payer: BC Managed Care – PPO | Admitting: Family Medicine

## 2014-05-05 ENCOUNTER — Encounter: Payer: Self-pay | Admitting: Family Medicine

## 2014-05-05 VITALS — BP 142/90 | Temp 97.9°F | Wt 178.0 lb

## 2014-05-05 DIAGNOSIS — J309 Allergic rhinitis, unspecified: Secondary | ICD-10-CM

## 2014-05-05 DIAGNOSIS — J300 Vasomotor rhinitis: Secondary | ICD-10-CM

## 2014-05-05 MED ORDER — PREDNISONE 20 MG PO TABS: ORAL_TABLET | ORAL | Status: DC

## 2014-05-05 NOTE — Progress Notes (Signed)
   Subjective:    Patient ID: Stephanie Espinoza, female    DOB: [birth year], 58 y.o.   MRN: [id number]  HPI Delta is a 58 year old female nonsmoker who comes in today for evaluation of allergic rhinitis  She states a week ago she developed head congestion 2 days ago developed some discomfort in her ears. She has no fever sore throat or Septra.  She say she has a history of sinusitis but has never had any documented x-rays.  She had an allergy workup by Dr. Donneta Romberg. Workup was negative it showed no allergens.  Since her allergy workup was negative she probably has a form of vasomotor rhinitis. If she has persistent facial pain I would recommend limited CT scan of the sinuses to rule out sinusitis prior to antibiotics   Review of Systems    is otherwise negative Objective:   Physical Exam  Well-developed well-nourished female no acute distress vital signs stable she is afebrile HEENT negative except for marked nasal edema neck was supple no adenopathy      Assessment & Plan:  Vasomotor rhinitis ........[name] prednisone burst and taper

## 2014-05-05 NOTE — Patient Instructions (Signed)
Prednisone 20 mg......... 2 tablets now.......... then starting tomorrow 2 tabs x3 days then taper as outlined  Afrin nasal spray........ one shot each nostril at bedtime for 5 nights

## 2014-05-05 NOTE — Progress Notes (Signed)
Pre visit review using our clinic review tool, if applicable. No additional management support is needed unless otherwise documented below in the visit note. 

## 2014-06-09 ENCOUNTER — Ambulatory Visit (INDEPENDENT_AMBULATORY_CARE_PROVIDER_SITE_OTHER): Payer: BC Managed Care – PPO | Admitting: Physician Assistant

## 2014-06-09 ENCOUNTER — Encounter: Payer: Self-pay | Admitting: Physician Assistant

## 2014-06-09 ENCOUNTER — Telehealth: Payer: Self-pay | Admitting: Family Medicine

## 2014-06-09 VITALS — BP 130/102 | HR 69 | Resp 14 | Ht 63.0 in | Wt 178.5 lb

## 2014-06-09 DIAGNOSIS — R42 Dizziness and giddiness: Secondary | ICD-10-CM

## 2014-06-09 MED ORDER — FLUTICASONE PROPIONATE 50 MCG/ACT NA SUSP: 2.0000 | Freq: Every day | NASAL | Status: DC

## 2014-06-09 MED ORDER — ONDANSETRON 8 MG PO TBDP: 8.0000 mg | ORAL_TABLET | Freq: Three times a day (TID) | ORAL | Status: DC | PRN

## 2014-06-09 MED ORDER — MECLIZINE HCL 25 MG PO TABS: 25.0000 mg | ORAL_TABLET | Freq: Three times a day (TID) | ORAL | Status: DC | PRN

## 2014-06-09 NOTE — Progress Notes (Signed)
Patient presents to clinic today c/o dizziness and ear pressure first noted upon waking this morning.  Felt fine yesterday evening.  Endorses a couple episodes of non-bloody, non-bilious emesis.  Has history of BPPV and ETD.  Denies fever, chills, or other URI symptom.  Denies change to hearing.   Past Medical History  Diagnosis Date  . Allergic rhinitis   . Anxiety   . Depression   . Bell's palsy after 1995 ?    no residual SE.  [name] History of drug abuse     drug usser [place].  cocaine  . Genital herpes   . Syphilis   . HIV (human immunodeficiency virus infection) 1990    from sexual contact who is unknown  . Abnormal Pap smear of cervix before 1995    always a repeat back to normal except for 1 time and had colpo bioosy -resutls were normal    No current outpatient prescriptions on file prior to visit.   No current facility-administered medications on file prior to visit.    Allergies  Allergen Reactions  . Nevirapine     REACTION: Rash 10/08    Family History  Problem Relation Age of Onset  . Diabetes Father     History   Social History  . Marital Status: Divorced    Spouse Name: N/A    Number of Children: N/A  . Years of Education: N/A   Social History Main Topics  . Smoking status: Former Smoker    Types: Cigarettes    Quit date: 03/01/1990  . Smokeless tobacco: Never Used  . Alcohol Use: No  . Drug Use: No  . Sexual Activity: Yes    Birth Control/ Protection: Post-menopausal     Comment: delcined condoms   Other Topics Concern  . None   Social History Narrative  . None   Review of Systems - See HPI.  All other ROS are negative.  BP 130/102  Pulse 69  Resp 14  Ht _0  (1.6 m)  Wt 178 lb 8 oz (80.967 kg)  BMI 31.63 kg/m2  SpO2 97%  LMP 11/04/1997  Physical Exam  Vitals reviewed. Constitutional: She is oriented to person, place, and time and well-developed, well-nourished, and in no distress.  HENT:  Head: Normocephalic and atraumatic.   Right Ear: External ear and ear canal normal. A middle ear effusion is present.  Left Ear: External ear and ear canal normal. A middle ear effusion is present.  Nose: Mucosal edema present. Right sinus exhibits no maxillary sinus tenderness and no frontal sinus tenderness. Left sinus exhibits no maxillary sinus tenderness and no frontal sinus tenderness.  Mouth/Throat: Uvula is midline and oropharynx is clear and moist. No oropharyngeal exudate.  Eyes: Conjunctivae are normal.  Cardiovascular: Normal rate, regular rhythm, normal heart sounds and intact distal pulses.   Pulmonary/Chest: Effort normal and breath sounds normal. No respiratory distress. She has no wheezes. She has no rales. She exhibits no tenderness.  Neurological: She is alert and oriented to person, place, and time.  Skin: Skin is warm and dry. No rash noted.  Psychiatric: Affect normal.    Recent Results (from the past 2160 hour(s))  HIV 1 RNA QUANT-NO REFLEX-BLD     Status: None   Collection Time    03/22/14  9:04 AM      Result Value Ref Range   HIV 1 RNA Quant <20  <20 copies/mL   Comment: HIV 1 RNA detected.   HIV1 RNA Quant,  Log <1.30  <1.30 log 10   Comment:       This test utilizes the [place] FDA approved Roche HIV-1 Test Kit by RT-PCR.        CBC     Status: Abnormal   Collection Time    03/22/14  9:05 AM      Result Value Ref Range   WBC 4.5  4.0 - 10.5 K/uL   RBC 4.22  3.87 - 5.11 MIL/uL   Hemoglobin 11.4 (*) 12.0 - 15.0 g/dL   HCT 35.1 (*) 36.0 - 46.0 %   MCV 83.2  78.0 - 100.0 fL   MCH 27.0  26.0 - 34.0 pg   MCHC 32.5  30.0 - 36.0 g/dL   RDW 14.2  11.5 - 15.5 %   Platelets 222  150 - 400 K/uL  LIPID PANEL     Status: Abnormal   Collection Time    03/22/14  9:05 AM      Result Value Ref Range   Cholesterol 158  0 - 200 mg/dL   Comment: ATP III Classification:           < 200        mg/dL        Desirable          200 - 239     mg/dL        Borderline High          >= 240        mg/dL        High          Triglycerides 165 (*) <150 mg/dL   HDL 37 (*) >39 mg/dL   Total CHOL/HDL Ratio 4.3     VLDL 33  0 - 40 mg/dL   LDL Cholesterol 88  0 - 99 mg/dL   Comment:       Total Cholesterol/HDL Ratio:CHD Risk                            Coronary Heart Disease Risk Table                                            Men       Women              1/2 Average Risk              3.4        3.3                  Average Risk              5.0        4.4               2X Average Risk              9.6        7.1               3X Average Risk             23.4       11.0     Use the calculated Patient Ratio above and the CHD Risk table      to determine the patient's CHD Risk.     ATP III Classification (  LDL):           < 100        mg/dL         Optimal          100 - 129     mg/dL         Near or Above Optimal          130 - 159     mg/dL         Borderline High          160 - 189     mg/dL         High           > 190        mg/dL         Very High        RPR     Status: None   Collection Time    03/22/14  9:05 AM      Result Value Ref Range   RPR NON REAC  NON REAC  COMPREHENSIVE METABOLIC PANEL     Status: Abnormal   Collection Time    03/22/14  9:05 AM      Result Value Ref Range   Sodium 139  135 - 145 mEq/L   Potassium 3.7  3.5 - 5.3 mEq/L   Chloride 107  96 - 112 mEq/L   CO2 26  19 - 32 mEq/L   Glucose, Bld 104 (*) 70 - 99 mg/dL   BUN 11  6 - 23 mg/dL   Creat 0.91  0.50 - 1.10 mg/dL   Total Bilirubin 0.6  0.2 - 1.2 mg/dL   Alkaline Phosphatase 83  39 - 117 U/L   AST 17  0 - 37 U/L   ALT 14  0 - 35 U/L   Total Protein 7.6  6.0 - 8.3 g/dL   Albumin 3.6  3.5 - 5.2 g/dL   Calcium 8.8  8.4 - 10.5 mg/dL  T-HELPER CELL (CD4)     Status: Abnormal   Collection Time    03/22/14 10:00 AM      Result Value Ref Range   CD4 T Cell Abs 540  400 - 2700 /uL   CD4 % Helper T Cell 20 (*) 33 - 55 %   Comment: Performed at [facility]   Assessment/Plan: 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  Discussed daily decongestant.  Rx Flonase.  Rx Meclizine for dizziness and Zofran for nausea.  Ginger ale also beneficial for nausea.  Increase fluid intake.  Rest.  Call or return to clinic if symptoms are not improving.

## 2014-06-09 NOTE — Telephone Encounter (Signed)
Patient Information:  Caller Name: [place]  Phone: [phone]  Patient: Stephanie Espinoza, Stephanie Espinoza  Gender: Female  DOB: [birth year]  Age: 58 Years  PCP: Midge Minium  Pregnant: No  Office Follow Up:  Does the office need to follow up with this patient?: No  Instructions For The Office: N/A  RN Note:  Voided at 0800,  Last BM 06/09/14. Advised to see provider now per MD protocol when office is open and appointment is available within 4 hours for severe dizziness. No appointment available within 4 hours at [facility]; scheduled for 1000 06/09/14 with Teresita Madura, PA at [facility] office.    Symptoms  Reason For Call & Symptoms: Woke up with dizziness.  Intermittent dizziness present with movment or if lying down and moves head.  Vomited once at [place].  No diarrhea.  Reviewed Health History In EMR: Yes  Reviewed Medications In EMR: Yes  Reviewed Allergies In EMR: Yes  Reviewed Surgeries / Procedures: Yes  Date of Onset of Symptoms: 06/09/2014  Treatments Tried: Sitting up, [place]  Treatments Tried Worked: No OB / GYN:  LMP: Unknown  Guideline(s) Used:  Dizziness  Disposition Per Guideline:   Go to ED Now (or to Office with PCP Approval)  Reason For Disposition Reached:   Severe dizziness (e.g., unable to stand, requires support to walk, feels like passing out now)  Advice Given:  Drink Fluids:  Drink several glasses of fruit juice, other clear fluids, or water. This will improve hydration and blood glucose. If you have a fever or have had heat exposure, make sure the fluids are cold.  Rest for 1-2 Hours:  Lie down with feet elevated for 1 hour. This will improve blood flow and increase blood flow to the brain.  Stand Up Slowly:  In the mornings, sit up for a few minutes before you stand up. That will help your blood flow make the adjustment.  If you have to stand up for long periods of time, contract and relax your leg muscles to help pump the blood back to the heart.  Sit down or lie  down if you feel dizzy.  Call Back If:  Passes out (faints)  You become worse.  Patient Will Follow Care Advice:  YES  Appointment Scheduled:  06/09/2014 10:00:00 Appointment Scheduled Provider:  Other

## 2014-06-09 NOTE — Patient Instructions (Signed)
Please take Zyrtec-D of Claritin-D daily over the next week.  Take Flonase daily. Stay well hydrated.  Limit salt intake.  Take Meclizine as directed for dizziness and Zofran for nausea.  Ginger ale willl also help with nausea.  If symptoms are not improving over the next couple of days, please call or return to office.

## 2014-06-09 NOTE — Telephone Encounter (Signed)
Appointment scheduled today at 1000 with Elyn Aquas, PA.  Pt kept appointment and has been evaluated and treated.

## 2014-06-09 NOTE — Assessment & Plan Note (Signed)
Secondary to serous OM.  Discussed daily decongestant.  Rx Flonase.  Rx Meclizine for dizziness and Zofran for nausea.  Ginger ale also beneficial for nausea.  Increase fluid intake.  Rest.  Call or return to clinic if symptoms are not improving.

## 2014-06-09 NOTE — Progress Notes (Signed)
Pre visit review using our clinic review tool, if applicable. No additional management support is needed unless otherwise documented below in the visit note/SLS  

## 2014-06-09 NOTE — Telephone Encounter (Signed)
Caller name: Jordana  Call back number:[phone]   Reason for call:   Pt is calling in with dizziness and vomiting.  Routed to CAN for live triage.

## 2014-06-15 ENCOUNTER — Telehealth: Payer: Self-pay | Admitting: General Practice

## 2014-06-15 NOTE — Telephone Encounter (Signed)
Spoke with pt and advised of tabori's advice. Eppley stretches faxed to pt.

## 2014-06-15 NOTE — Telephone Encounter (Signed)
Stephanie Espinoza called concerned because the medicine she received on 06/09/14 is not helping with her vertigo and dizziness. She is wondering what her options are at this point. Does she need to try another medicine or go see a specialist. Would you please call her back at [phone] and advise her what to do.

## 2014-06-15 NOTE — Telephone Encounter (Signed)
Reviewed note from 8/6- Agree w/ Flonase Continue Zofran as needed for nausea Increase Meclizine to 2 tabs (50mg ) as needed Please send copy of Modified [place] Maneuver to pt (available on UpToDate) for her to perform as directed If no improvement by end of week/beginning of next week will need ENT referral

## 2014-09-05 ENCOUNTER — Encounter: Payer: Self-pay | Admitting: Physician Assistant

## 2014-09-15 ENCOUNTER — Encounter: Payer: Self-pay | Admitting: Internal Medicine

## 2014-09-15 ENCOUNTER — Ambulatory Visit (INDEPENDENT_AMBULATORY_CARE_PROVIDER_SITE_OTHER): Payer: BC Managed Care – PPO | Admitting: Internal Medicine

## 2014-09-15 ENCOUNTER — Other Ambulatory Visit (INDEPENDENT_AMBULATORY_CARE_PROVIDER_SITE_OTHER): Payer: BC Managed Care – PPO

## 2014-09-15 VITALS — BP 162/102 | HR 76 | Temp 98.2°F | Resp 14 | Ht 63.0 in | Wt 187.0 lb

## 2014-09-15 DIAGNOSIS — J300 Vasomotor rhinitis: Secondary | ICD-10-CM

## 2014-09-15 DIAGNOSIS — I10 Essential (primary) hypertension: Secondary | ICD-10-CM

## 2014-09-15 DIAGNOSIS — Z0000 Encounter for general adult medical examination without abnormal findings: Secondary | ICD-10-CM

## 2014-09-15 DIAGNOSIS — F32A Depression, unspecified: Secondary | ICD-10-CM

## 2014-09-15 DIAGNOSIS — B20 Human immunodeficiency virus [HIV] disease: Secondary | ICD-10-CM

## 2014-09-15 DIAGNOSIS — F329 Major depressive disorder, single episode, unspecified: Secondary | ICD-10-CM

## 2014-09-15 LAB — HEMOGLOBIN A1C: Hgb A1c MFr Bld: 5.8 % (ref 4.6–6.5)

## 2014-09-15 LAB — CBC
HCT: 38.3 % (ref 36.0–46.0)
Hemoglobin: 12.5 g/dL (ref 12.0–15.0)
MCHC: 32.6 g/dL (ref 30.0–36.0)
MCV: 86.5 fl (ref 78.0–100.0)
Platelets: 228 10*3/uL (ref 150.0–400.0)
RBC: 4.43 Mil/uL (ref 3.87–5.11)
RDW: 14.2 % (ref 11.5–15.5)
WBC: 4.4 10*3/uL (ref 4.0–10.5)

## 2014-09-15 LAB — LIPID PANEL
Cholesterol: 178 mg/dL (ref 0–200)
HDL: 33.3 mg/dL — ABNORMAL LOW (ref >39.00)
LDL Cholesterol: 113 mg/dL — ABNORMAL HIGH (ref 0–99)
NonHDL: 144.7
Total CHOL/HDL Ratio: 5
Triglycerides: 161 mg/dL — ABNORMAL HIGH (ref 0.0–149.0)
VLDL: 32.2 mg/dL (ref 0.0–40.0)

## 2014-09-15 LAB — COMPREHENSIVE METABOLIC PANEL
ALT: 14 U/L (ref 0–35)
AST: 20 U/L (ref 0–37)
Albumin: 3 g/dL — ABNORMAL LOW (ref 3.5–5.2)
Alkaline Phosphatase: 81 U/L (ref 39–117)
BUN: 12 mg/dL (ref 6–23)
CO2: 23 mEq/L (ref 19–32)
Calcium: 9.1 mg/dL (ref 8.4–10.5)
Chloride: 109 mEq/L (ref 96–112)
Creatinine, Ser: 1 mg/dL (ref 0.4–1.2)
GFR: 72.09 mL/min (ref >60.00)
Glucose, Bld: 104 mg/dL — ABNORMAL HIGH (ref 70–99)
Potassium: 3.8 mEq/L (ref 3.5–5.1)
Sodium: 142 mEq/L (ref 135–145)
Total Bilirubin: 0.9 mg/dL (ref 0.2–1.2)
Total Protein: 8 g/dL (ref 6.0–8.3)

## 2014-09-15 NOTE — Patient Instructions (Addendum)
We will check your blood work today. We will not check your blood work for HIV as your doctor, Dr. Megan Salon will check this blood work the first week of December. It is important to keep that appointment for that blood work.  I would not recommend going off your HIV medication without talking to your infectious disease doctor. This medication was helping to keep the levels of virus in your blood low. You are now at risk for spreading this virus to other people. Please be aware that this virus does spread via bodily fluids including blood. We then your infectious disease doctor will continue to monitor your immune cell counts as well as the levels of virus in your bloodstream.   The HIV virus in your bloodstream as well as affecting your immune system can also cause generalized inflammation which can put you at increased risk for heart disease, high blood pressure, diabetes.  We will have them send you the test for colon cancer screening to your house. If the tests comes back negative it means that you do not likely have any polyps that would be worrisome for colon cancer. If the tests comes back positive it means that you likely to have polyps and we cannot tell if these would turn into colon cancer or not without doing colonoscopy. So we would need to send you for colonoscopy if the tests comes back positive.  We would like you to come back in about 3-6 months so that we can check on your blood pressure. Having high blood pressure that is not controlled by medication for long periods of time can increase your risk of heart attack, stroke.  Things that you can do to decrease her blood pressure without medication include regular exercise, low salt or sodium in your diet.  Low-Sodium Eating Plan Sodium raises blood pressure and causes water to be held in the body. Getting less sodium from food will help lower your blood pressure, reduce any swelling, and protect your heart, liver, and kidneys. We get  sodium by adding salt (sodium chloride) to food. Most of our sodium comes from canned, boxed, and frozen foods. Restaurant foods, fast foods, and pizza are also very high in sodium. Even if you take medicine to lower your blood pressure or to reduce fluid in your body, getting less sodium from your food is important. WHAT IS MY PLAN? Most people should limit their sodium intake to 2,300 mg a day. Your health care provider recommends that you limit your sodium intake to __________ a day.  WHAT DO I NEED TO KNOW ABOUT THIS EATING PLAN? For the low-sodium eating plan, you will follow these general guidelines:  Choose foods with a % Daily Value for sodium of less than 5% (as listed on the food label).   Use salt-free seasonings or herbs instead of table salt or sea salt.   Check with your health care provider or pharmacist before using salt substitutes.   Eat fresh foods.  Eat more vegetables and fruits.  Limit canned vegetables. If you do use them, rinse them well to decrease the sodium.   Limit cheese to 1 oz (28 g) per day.   Eat lower-sodium products, often labeled as "lower sodium" or "no salt added."  Avoid foods that contain monosodium glutamate (MSG). MSG is sometimes added to [place] food and some canned foods.  Check food labels (Nutrition Facts labels) on foods to learn how much sodium is in one serving.  Eat more home-cooked food and  less restaurant, buffet, and fast food.  When eating at a restaurant, ask that your food be prepared with less salt or none, if possible.  HOW DO I READ FOOD LABELS FOR SODIUM INFORMATION? The Nutrition Facts label lists the amount of sodium in one serving of the food. If you eat more than one serving, you must multiply the listed amount of sodium by the number of servings. Food labels may also identify foods as:  Sodium free--Less than 5 mg in a serving.  Very low sodium--35 mg or less in a serving.  Low sodium--140 mg or less in a  serving.  Light in sodium--50% less sodium in a serving. For example, if a food that usually has 300 mg of sodium is changed to become light in sodium, it will have 150 mg of sodium.  Reduced sodium--25% less sodium in a serving. For example, if a food that usually has 400 mg of sodium is changed to reduced sodium, it will have 300 mg of sodium. WHAT FOODS CAN I EAT? Grains Low-sodium cereals, including oats, puffed wheat and rice, and shredded wheat cereals. Low-sodium crackers. Unsalted rice and pasta. Lower-sodium bread.  Vegetables Frozen or fresh vegetables. Low-sodium or reduced-sodium canned vegetables. Low-sodium or reduced-sodium tomato sauce and paste. Low-sodium or reduced-sodium tomato and vegetable juices.  Fruits Fresh, frozen, and canned fruit. Fruit juice.  Meat and Other Protein Products Low-sodium canned tuna and salmon. Fresh or frozen meat, poultry, seafood, and fish. Lamb. Unsalted nuts. Dried beans, peas, and lentils without added salt. Unsalted canned beans. Homemade soups without salt. Eggs.  Dairy Milk. Soy milk. Ricotta cheese. Low-sodium or reduced-sodium cheeses. Yogurt.  Condiments Fresh and dried herbs and spices. Salt-free seasonings. Onion and garlic powders. Low-sodium varieties of mustard and ketchup. Lemon juice.  Fats and Oils Reduced-sodium salad dressings. Unsalted butter.  Other Unsalted popcorn and pretzels.  The items listed above may not be a complete list of recommended foods or beverages. Contact your dietitian for more options. WHAT FOODS ARE NOT RECOMMENDED? Grains Instant hot cereals. Bread stuffing, pancake, and biscuit mixes. Croutons. Seasoned rice or pasta mixes. Noodle soup cups. Boxed or frozen macaroni and cheese. Self-rising flour. Regular salted crackers. Vegetables Regular canned vegetables. Regular canned tomato sauce and paste. Regular tomato and vegetable juices. Frozen vegetables in sauces. Salted french fries.  Olives. Angie Fava. Relishes. Sauerkraut. Salsa. Meat and Other Protein Products Salted, canned, smoked, spiced, or pickled meats, seafood, or fish. Bacon, ham, sausage, hot dogs, corned beef, chipped beef, and packaged luncheon meats. Salt pork. Jerky. Pickled herring. Anchovies, regular canned tuna, and sardines. Salted nuts. Dairy Processed cheese and cheese spreads. Cheese curds. Blue cheese and cottage cheese. Buttermilk.  Condiments Onion and garlic salt, seasoned salt, table salt, and sea salt. Canned and packaged gravies. Worcestershire sauce. Tartar sauce. Barbecue sauce. Teriyaki sauce. Soy sauce, including reduced sodium. Steak sauce. Fish sauce. Oyster sauce. Cocktail sauce. Horseradish. Regular ketchup and mustard. Meat flavorings and tenderizers. Bouillon cubes. Hot sauce. Tabasco sauce. Marinades. Taco seasonings. Relishes. Fats and Oils Regular salad dressings. Salted butter. Margarine. Ghee. Bacon fat.  Other Potato and tortilla chips. Corn chips and puffs. Salted popcorn and pretzels. Canned or dried soups. Pizza. Frozen entrees and pot pies.  The items listed above may not be a complete list of foods and beverages to avoid. Contact your dietitian for more information. Document Released: 04/12/2002 Document Revised: 10/26/2013 Document Reviewed: 08/25/2013 [facility] Patient Information 2015 [place], [place]. This information is not intended to replace advice given  to you by your health care provider. Make sure you discuss any questions you have with your health care provider.  

## 2014-09-15 NOTE — Progress Notes (Signed)
Pre visit review using our clinic review tool, if applicable. No additional management support is needed unless otherwise documented below in the visit note. 

## 2014-09-16 DIAGNOSIS — I10 Essential (primary) hypertension: Secondary | ICD-10-CM | POA: Insufficient documentation

## 2014-09-16 NOTE — Progress Notes (Signed)
   Subjective:    Patient ID: Stephanie Espinoza, female    DOB: 03-09-1956, 58 y.o.   MRN: [id number]  HPI The patient is a 58 year old female who comes in today to establish care. She has past medical history of HIV, depression, occasional elevated blood pressures. She used to be on complera for her HIV however since last visit has stopped on her own. She states that she doesn't like to take medications for very long and she likes to stop and she feels like she's gotten things under control. She denies any side effects from the medication. She states that her mood is fairly good right now. She denies headache, nausea, confusion, abdominal pain, chest pain. She states that her blood pressures have been high in the past although she's never been on medication for it as she does not like medication.   Review of Systems  Constitutional: Negative for fever, activity change, appetite change, fatigue and unexpected weight change.  HENT: Negative.   Respiratory: Negative for cough, chest tightness, shortness of breath and wheezing.   Cardiovascular: Negative for chest pain, palpitations and leg swelling.  Gastrointestinal: Negative for abdominal pain, diarrhea, constipation and abdominal distention.  Musculoskeletal: Negative.   Skin: Negative.   Neurological: Negative.   Psychiatric/Behavioral: Positive for dysphoric mood.      Objective:   Physical Exam  Constitutional: She is oriented to person, place, and time. She appears well-developed and well-nourished.  overweight  HENT:  Head: Normocephalic and atraumatic.  Eyes: EOM are normal.  Neck: Normal range of motion.  Cardiovascular: Normal rate and regular rhythm.   Pulmonary/Chest: Effort normal and breath sounds normal. No respiratory distress. She has no wheezes. She has no rales.  Abdominal: Soft. Bowel sounds are normal. She exhibits no distension. There is no tenderness. There is no rebound.  Neurological: She is alert and oriented to  person, place, and time. Coordination normal.  Skin: Skin is warm and dry.  Vitals reviewed.  Filed Vitals:   09/15/14 0810  BP: 162/102  Pulse: 76  Temp: 98.2 F (36.8 C)  TempSrc: Oral  Resp: 14  Height: 5\' 3"  (1.6 m)  Weight: 187 lb (84.823 kg)  SpO2: 95%      Assessment & Plan:

## 2014-09-16 NOTE — Assessment & Plan Note (Signed)
Patient does seem to have decision-making capacity. She is able to understand risks and benefits and make her own decisions.

## 2014-09-16 NOTE — Assessment & Plan Note (Signed)
She does use fluticasone as needed but tries not to take it very often.

## 2014-09-16 NOTE — Assessment & Plan Note (Signed)
Seeing as how patient has stopped her HIV medicines since last visit will forward this note to her infectious disease doctor. She is due for lab work the first week of December so will not check today. Talked to her about the risks of being off antiviral therapy including increased risk of heart attack, stroke, high cholesterol, diabetes, hypertension. Also talked to her about the risk of infection to other people. Talked to her about the ways of transmission of HIV virus.

## 2014-09-16 NOTE — Assessment & Plan Note (Signed)
Check basic metabolic panel given high blood pressure today. Recheck was not changed. Talked to her extensively about the risks of running high blood pressure for a long period of time. She does not wish to start blood pressure medication at this time. We did discuss how exercise can help lower her blood pressure and we talked about low-salt diet. We also talked about the fact that her uncontrolled HIV may be causing her blood pressure to be elevated and she may have to decide whether she wishes to take medication for HIV or for blood pressure and other possible sequela in the future.

## 2014-09-24 LAB — COLOGUARD

## 2014-10-14 ENCOUNTER — Other Ambulatory Visit: Payer: Self-pay | Admitting: Internal Medicine

## 2014-10-18 ENCOUNTER — Other Ambulatory Visit: Payer: BC Managed Care – PPO

## 2014-10-20 ENCOUNTER — Encounter: Payer: Self-pay | Admitting: Internal Medicine

## 2014-11-24 ENCOUNTER — Ambulatory Visit (INDEPENDENT_AMBULATORY_CARE_PROVIDER_SITE_OTHER): Payer: BLUE CROSS/BLUE SHIELD | Admitting: Internal Medicine

## 2014-11-24 ENCOUNTER — Encounter: Payer: Self-pay | Admitting: Internal Medicine

## 2014-11-24 ENCOUNTER — Other Ambulatory Visit (INDEPENDENT_AMBULATORY_CARE_PROVIDER_SITE_OTHER): Payer: BLUE CROSS/BLUE SHIELD

## 2014-11-24 VITALS — BP 116/78 | HR 85 | Temp 98.1°F | Resp 12 | Ht 62.0 in | Wt 180.4 lb

## 2014-11-24 DIAGNOSIS — R42 Dizziness and giddiness: Secondary | ICD-10-CM

## 2014-11-24 DIAGNOSIS — B20 Human immunodeficiency virus [HIV] disease: Secondary | ICD-10-CM

## 2014-11-24 DIAGNOSIS — J069 Acute upper respiratory infection, unspecified: Secondary | ICD-10-CM

## 2014-11-24 LAB — CBC
HCT: 37.8 % (ref 36.0–46.0)
Hemoglobin: 12.7 g/dL (ref 12.0–15.0)
MCHC: 33.6 g/dL (ref 30.0–36.0)
MCV: 83 fl (ref 78.0–100.0)
Platelets: 207 10*3/uL (ref 150.0–400.0)
RBC: 4.55 Mil/uL (ref 3.87–5.11)
RDW: 13.7 % (ref 11.5–15.5)
WBC: 4.8 10*3/uL (ref 4.0–10.5)

## 2014-11-24 MED ORDER — FLUTICASONE PROPIONATE 50 MCG/ACT NA SUSP: 2.0000 | Freq: Every day | NASAL | Status: DC

## 2014-11-24 NOTE — Progress Notes (Signed)
   Subjective:    Patient ID: Stephanie Espinoza, female    DOB: [birth year], 59 y.o.   MRN: [id number]  HPI The patient is a 59 YO female who is coming in today for sinus congestion and drainage for 2-3 days. She states it started Tuesday night. She has had some chills, muscle aches, sore throat. Not much cough, minimal ear pain. She is still not taking her HIV medication. She wanted to make sure she was okay. No cough, SOB, wheezing.   Review of Systems  Constitutional: Positive for chills. Negative for fever, activity change, appetite change, fatigue and unexpected weight change.  HENT: Positive for congestion and sinus pressure. Negative for ear discharge, ear pain, postnasal drip, rhinorrhea and sore throat.   Respiratory: Negative for cough, chest tightness, shortness of breath and wheezing.   Cardiovascular: Negative for chest pain, palpitations and leg swelling.  Gastrointestinal: Negative for abdominal pain, diarrhea, constipation and abdominal distention.  Neurological: Negative.       Objective:   Physical Exam  Constitutional: She is oriented to person, place, and time. She appears well-developed and well-nourished.  HENT:  Head: Normocephalic and atraumatic.  Right Ear: External ear normal.  Left Ear: External ear normal.  Oropharynx with some mild erythema.  Eyes: EOM are normal.  Neck: Normal range of motion.  Cardiovascular: Normal rate and regular rhythm.   Pulmonary/Chest: Effort normal and breath sounds normal. No respiratory distress. She has no wheezes. She has no rales.  Abdominal: Soft. Bowel sounds are normal.  Neurological: She is alert and oriented to person, place, and time. Coordination normal.  Skin: Skin is warm and dry.   Filed Vitals:   11/24/14 1404  BP: 116/78  Pulse: 85  Temp: 98.1 F (36.7 C)  TempSrc: Oral  Resp: 12  Height: 5\' 2"  (1.575 m)  Weight: 180 lb 6.4 oz (81.829 kg)  SpO2: 96%      Assessment & Plan:

## 2014-11-24 NOTE — Progress Notes (Signed)
Pre visit review using our clinic review tool, if applicable. No additional management support is needed unless otherwise documented below in the visit note. 

## 2014-11-24 NOTE — Patient Instructions (Signed)
We have sent in a refill today of the flonase. Use 2 sprays in each nostril twice a day for 3 days then you can use it once a day after that.   We think that this is a virus and you do not need antibiotics. We will check your blood work today to make sure your immune system is strong enough to fight this off.   Upper Respiratory Infection, Adult An upper respiratory infection (URI) is also sometimes known as the common cold. The upper respiratory tract includes the nose, sinuses, throat, trachea, and bronchi. Bronchi are the airways leading to the lungs. Most people improve within 1 week, but symptoms can last up to 2 weeks. A residual cough may last even longer.  CAUSES Many different viruses can infect the tissues lining the upper respiratory tract. The tissues become irritated and inflamed and often become very moist. Mucus production is also common. A cold is contagious. You can easily spread the virus to others by oral contact. This includes kissing, sharing a glass, coughing, or sneezing. Touching your mouth or nose and then touching a surface, which is then touched by another person, can also spread the virus. SYMPTOMS  Symptoms typically develop 1 to 3 days after you come in contact with a cold virus. Symptoms vary from person to person. They may include:  Runny nose.  Sneezing.  Nasal congestion.  Sinus irritation.  Sore throat.  Loss of voice (laryngitis).  Cough.  Fatigue.  Muscle aches.  Loss of appetite.  Headache.  Low-grade fever. DIAGNOSIS  You might diagnose your own cold based on familiar symptoms, since most people get a cold 2 to 3 times a year. Your caregiver can confirm this based on your exam. Most importantly, your caregiver can check that your symptoms are not due to another disease such as strep throat, sinusitis, pneumonia, asthma, or epiglottitis. Blood tests, throat tests, and X-rays are not necessary to diagnose a common cold, but they may sometimes be  helpful in excluding other more serious diseases. Your caregiver will decide if any further tests are required. RISKS AND COMPLICATIONS  You may be at risk for a more severe case of the common cold if you smoke cigarettes, have chronic heart disease (such as heart failure) or lung disease (such as asthma), or if you have a weakened immune system. The very young and very old are also at risk for more serious infections. Bacterial sinusitis, middle ear infections, and bacterial pneumonia can complicate the common cold. The common cold can worsen asthma and chronic obstructive pulmonary disease (COPD). Sometimes, these complications can require emergency medical care and may be life-threatening. PREVENTION  The best way to protect against getting a cold is to practice good hygiene. Avoid oral or hand contact with people with cold symptoms. Wash your hands often if contact occurs. There is no clear evidence that vitamin C, vitamin E, echinacea, or exercise reduces the chance of developing a cold. However, it is always recommended to get plenty of rest and practice good nutrition. TREATMENT  Treatment is directed at relieving symptoms. There is no cure. Antibiotics are not effective, because the infection is caused by a virus, not by bacteria. Treatment may include:  Increased fluid intake. Sports drinks offer valuable electrolytes, sugars, and fluids.  Breathing heated mist or steam (vaporizer or shower).  Eating chicken soup or other clear broths, and maintaining good nutrition.  Getting plenty of rest.  Using gargles or lozenges for comfort.  Controlling fevers with  ibuprofen or acetaminophen as directed by your caregiver.  Increasing usage of your inhaler if you have asthma. Zinc gel and zinc lozenges, taken in the first 24 hours of the common cold, can shorten the duration and lessen the severity of symptoms. Pain medicines may help with fever, muscle aches, and throat pain. A variety of  non-prescription medicines are available to treat congestion and runny nose. Your caregiver can make recommendations and may suggest nasal or lung inhalers for other symptoms.  HOME CARE INSTRUCTIONS   Only take over-the-counter or prescription medicines for pain, discomfort, or fever as directed by your caregiver.  Use a warm mist humidifier or inhale steam from a shower to increase air moisture. This may keep secretions moist and make it easier to breathe.  Drink enough water and fluids to keep your urine clear or pale yellow.  Rest as needed.  Return to work when your temperature has returned to normal or as your caregiver advises. You may need to stay home longer to avoid infecting others. You can also use a face mask and careful hand washing to prevent spread of the virus. SEEK MEDICAL CARE IF:   After the first few days, you feel you are getting worse rather than better.  You need your caregiver's advice about medicines to control symptoms.  You develop chills, worsening shortness of breath, or brown or red sputum. These may be signs of pneumonia.  You develop yellow or brown nasal discharge or pain in the face, especially when you bend forward. These may be signs of sinusitis.  You develop a fever, swollen neck glands, pain with swallowing, or white areas in the back of your throat. These may be signs of strep throat. SEEK IMMEDIATE MEDICAL CARE IF:   You have a fever.  You develop severe or persistent headache, ear pain, sinus pain, or chest pain.  You develop wheezing, a prolonged cough, cough up blood, or have a change in your usual mucus (if you have chronic lung disease).  You develop sore muscles or a stiff neck. Document Released: 04/16/2001 Document Revised: 01/13/2012 Document Reviewed: 01/26/2014 [facility] Patient Information 2015 [place], [place]. This information is not intended to replace advice given to you by your health care provider. Make sure you discuss any  questions you have with your health care provider.

## 2014-11-27 DIAGNOSIS — J069 Acute upper respiratory infection, unspecified: Secondary | ICD-10-CM | POA: Insufficient documentation

## 2014-11-27 NOTE — Assessment & Plan Note (Signed)
Appears to be viral illness. No hypoxia or coughing to suggest PCP. No antibiotics indicated at this time.

## 2014-11-27 NOTE — Assessment & Plan Note (Signed)
Still not taking medication and does not wish to start. Will check CD4 count today given acute illness to see if she needs to start ppx. She did not go to her last ID visit.

## 2014-11-28 ENCOUNTER — Other Ambulatory Visit: Payer: Self-pay | Admitting: Internal Medicine

## 2014-11-28 ENCOUNTER — Other Ambulatory Visit: Payer: BLUE CROSS/BLUE SHIELD

## 2014-11-28 DIAGNOSIS — B20 Human immunodeficiency virus [HIV] disease: Secondary | ICD-10-CM

## 2014-11-28 LAB — HIV-1 RNA QUANT-NO REFLEX-BLD
HIV 1 RNA Quant: 1085 copies/mL — ABNORMAL HIGH (ref <20)
HIV-1 RNA Quant, Log: 3.04 {Log} — ABNORMAL HIGH (ref <1.30)

## 2014-11-29 LAB — T-HELPER CELLS (CD4) COUNT (NOT AT ARMC)
Absolute CD4: 387 /uL (ref 381–1469)
CD4 T Helper %: 20 % — ABNORMAL LOW (ref 32–62)
Total Lymphocyte: 43 % (ref 12–46)
Total lymphocyte count: 1935 /uL (ref 700–3300)
WBC, lymph enumeration: 4.5 10*3/uL (ref 4.0–10.5)

## 2014-12-21 ENCOUNTER — Other Ambulatory Visit: Payer: BLUE CROSS/BLUE SHIELD

## 2014-12-21 DIAGNOSIS — Z113 Encounter for screening for infections with a predominantly sexual mode of transmission: Secondary | ICD-10-CM

## 2014-12-21 DIAGNOSIS — B20 Human immunodeficiency virus [HIV] disease: Secondary | ICD-10-CM

## 2014-12-22 LAB — URINE CYTOLOGY ANCILLARY ONLY
Chlamydia: NEGATIVE
Neisseria Gonorrhea: NEGATIVE

## 2014-12-22 LAB — HIV-1 RNA QUANT-NO REFLEX-BLD
HIV 1 RNA Quant: 284 copies/mL — ABNORMAL HIGH (ref <20)
HIV-1 RNA Quant, Log: 2.45 {Log} — ABNORMAL HIGH (ref <1.30)

## 2014-12-22 LAB — T-HELPER CELL (CD4) - (RCID CLINIC ONLY)
CD4 % Helper T Cell: 16 % — ABNORMAL LOW (ref 33–55)
CD4 T Cell Abs: 500 /uL (ref 400–2700)

## 2015-01-06 ENCOUNTER — Encounter: Payer: Self-pay | Admitting: *Deleted

## 2015-01-09 ENCOUNTER — Ambulatory Visit: Payer: BLUE CROSS/BLUE SHIELD | Admitting: Internal Medicine

## 2015-01-10 ENCOUNTER — Encounter: Payer: Self-pay | Admitting: Internal Medicine

## 2015-01-10 ENCOUNTER — Ambulatory Visit (INDEPENDENT_AMBULATORY_CARE_PROVIDER_SITE_OTHER): Payer: BLUE CROSS/BLUE SHIELD | Admitting: Internal Medicine

## 2015-01-10 DIAGNOSIS — B20 Human immunodeficiency virus [HIV] disease: Secondary | ICD-10-CM

## 2015-01-10 NOTE — Progress Notes (Signed)
Patient ID: Stephanie Espinoza, female   DOB: [birth year], 57 y.o.   MRN: [id number]          Patient Active Problem List   Diagnosis Date Noted  . Human immunodeficiency virus (HIV) disease 09/06/2006    Priority: High  . GENITAL HERPES 09/06/2006    Priority: Medium  . URI (upper respiratory infection) 11/27/2014  . Essential hypertension 09/16/2014  . 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 OTHER NEUTROPENIA 03/22/2009  . SYPHILIS NOS 09/06/2006  . Depression 09/06/2006  . BELL'S PALSY 09/06/2006  . PAP SMEAR, ABNORMAL 09/06/2006    Patient's Medications  New Prescriptions   No medications on file  Previous Medications   COMPLERA 200-25-300 MG TABS    TAKE 1 TABLET BY MOUTH DAILY.   FLUTICASONE (FLONASE) 50 MCG/ACT NASAL SPRAY    Place 2 sprays into both nostrils daily.   MECLIZINE (ANTIVERT) 25 MG TABLET    Take 1 tablet (25 mg total) by mouth 3 (three) times daily as needed for dizziness.  Modified Medications   No medications on file  Discontinued Medications   No medications on file    Subjective: Stephanie Espinoza is in for her routine follow-up visit for HIV infection. She was feeling well last fall and decided that she would take another drug holiday. She tapered off of Complera over a several month period. The only thing she noticed differently after stopping Complera was that her herpes outbreaks became slightly more frequent. She went to the pharmacy recently and refill Complera and is considering restarting it soon. She wanted to come see me before making that decision. She states that she is often around more people and more active during summer months and feels better if she is back on the Complera in case she gets exposed to something else.   Review of Systems: Pertinent items are noted in HPI.  Past Medical History  Diagnosis Date  . Allergic rhinitis   . Anxiety   . Depression   . Bell's palsy after 1995 ?    no residual SE.  [name] History of drug abuse     drug usser [place].  cocaine  . Genital herpes   . Syphilis   . HIV (human immunodeficiency virus infection) 1990    from sexual contact who is unknown  . Abnormal Pap smear of cervix before 1995    always a repeat back to normal except for 1 time and had colpo bioosy -resutls were normal    History  Substance Use Topics  . Smoking status: Former Smoker    Types: Cigarettes    Quit date: 03/01/1990  . Smokeless tobacco: Never Used  . Alcohol Use: No    Family History  Problem Relation Age of Onset  . Diabetes Father     Allergies  Allergen Reactions  . Haldol [Haloperidol] Other (See Comments)    Wipes out her memory  . Nevirapine     REACTION: Rash 10/08    Objective: Temp: 98.7 F (37.1 C) (03/08 1538) Temp Source: Oral (03/08 1538) BP: 149/84 mmHg (03/08 1538) Pulse Rate: 78 (03/08 1538) Body mass index is 32.27 kg/(m^2).  General: She is smiling and in good spirits Oral: No oropharyngeal lesions Skin: No rash Lungs: Clear Cor: Regular S1 and S2 with no murmur  Lab Results Lab Results  Component Value Date   WBC 4.8 11/24/2014   HGB 12.7 11/24/2014   HCT 37.8 11/24/2014   MCV 83.0 11/24/2014   PLT 207.0  11/24/2014    Lab Results  Component Value Date   CREATININE 1.0 09/15/2014   BUN 12 09/15/2014   NA 142 09/15/2014   K 3.8 09/15/2014   CL 109 09/15/2014   CO2 23 09/15/2014    Lab Results  Component Value Date   ALT 14 09/15/2014   AST 20 09/15/2014   ALKPHOS 81 09/15/2014   BILITOT 0.9 09/15/2014    Lab Results  Component Value Date   CHOL 178 09/15/2014   HDL 33.30* 09/15/2014   LDLCALC 113* 09/15/2014   LDLDIRECT 102.1 03/02/2009   TRIG 161.0* 09/15/2014   CHOLHDL 5 09/15/2014    Lab Results HIV 1 RNA QUANT (copies/mL)  Date Value  12/21/2014 284*  11/24/2014 1085*  03/22/2014 <20   CD4 T CELL ABS (/uL)  Date Value  12/21/2014 500  03/22/2014 540  07/12/2013 510     Assessment: Stephanie Espinoza is viral load has reactivated slightly off of  antiretroviral therapy. She has very strong beliefs about how to use her medications and frequently feels better with drug holidays. I told her that in the future if she is taking her medication again but then decides to stop she should stop it abruptly rather than tapering it so as not to increase the risk of resistance developing to the regimen. She says she is ready to restart Complera now. I encouraged her to try not to miss a single dose. She will follow-up in 6 weeks to make sure that her viral load suppresses again.  Plan: 1. Restart Complera 2. Follow-up after lab work in [place] weeks   Michel Bickers, MD [facility] for [place] [phone] pager   [phone] cell 01/10/2015, 4:19 PM

## 2015-02-22 ENCOUNTER — Other Ambulatory Visit: Payer: BLUE CROSS/BLUE SHIELD

## 2015-03-07 ENCOUNTER — Ambulatory Visit: Payer: BLUE CROSS/BLUE SHIELD | Admitting: Internal Medicine

## 2015-04-05 ENCOUNTER — Telehealth: Payer: Self-pay | Admitting: Geriatric Medicine

## 2015-04-05 NOTE — Telephone Encounter (Signed)
Spoke with patient. She has not had a mammogram and she does not want to get one at this time.

## 2015-08-21 ENCOUNTER — Telehealth: Payer: Self-pay | Admitting: Lab

## 2015-08-21 NOTE — Telephone Encounter (Signed)
Per Dr. Hale Bogus List-LM for pt to cal to make an appmt to see him.  Home & cell

## 2016-01-11 ENCOUNTER — Other Ambulatory Visit: Payer: 59

## 2016-01-11 DIAGNOSIS — B20 Human immunodeficiency virus [HIV] disease: Secondary | ICD-10-CM

## 2016-01-12 LAB — T-HELPER CELL (CD4) - (RCID CLINIC ONLY)
CD4 % Helper T Cell: 23 % — ABNORMAL LOW (ref 33–55)
CD4 T Cell Abs: 450 /uL (ref 400–2700)

## 2016-01-14 LAB — HIV-1 RNA QUANT-NO REFLEX-BLD
HIV 1 RNA Quant: 159 copies/mL — ABNORMAL HIGH (ref <20)
HIV-1 RNA Quant, Log: 2.2 Log copies/mL — ABNORMAL HIGH (ref <1.30)

## 2016-01-25 ENCOUNTER — Telehealth: Payer: Self-pay | Admitting: *Deleted

## 2016-01-25 ENCOUNTER — Encounter: Payer: Self-pay | Admitting: Internal Medicine

## 2016-01-25 ENCOUNTER — Ambulatory Visit (INDEPENDENT_AMBULATORY_CARE_PROVIDER_SITE_OTHER): Payer: 59 | Admitting: Internal Medicine

## 2016-01-25 VITALS — BP 177/114 | HR 69 | Temp 98.7°F | Ht 62.0 in | Wt 187.5 lb

## 2016-01-25 DIAGNOSIS — B20 Human immunodeficiency virus [HIV] disease: Secondary | ICD-10-CM | POA: Diagnosis not present

## 2016-01-25 DIAGNOSIS — A6000 Herpesviral infection of urogenital system, unspecified: Secondary | ICD-10-CM

## 2016-01-25 DIAGNOSIS — I10 Essential (primary) hypertension: Secondary | ICD-10-CM

## 2016-01-25 MED ORDER — VALACYCLOVIR HCL 500 MG PO TABS: 500.0000 mg | ORAL_TABLET | Freq: Two times a day (BID) | ORAL | Status: DC

## 2016-01-25 MED ORDER — EMTRICITAB-RILPIVIR-TENOFOV AF 200-25-25 MG PO TABS: 1.0000 | ORAL_TABLET | Freq: Every day | ORAL | Status: DC

## 2016-01-25 NOTE — Assessment & Plan Note (Signed)
Stephanie Espinoza continues to have a hard time believing that antiretroviral therapy will make her better. Fortunately her numbers have consistently shown that she is able to obtain reasonable control of her infection without treatment. Her viral load remains relatively low and her CD4 count has always been in the normal range. I talked her about the theoretical benefits of antiretroviral therapy even if her numbers look relatively good. There is concerned that ongoing, low level infection contributes to systemic inflammation which can lead to bad things such as metabolic syndrome and cancer. I used the analogy of letting her car idle all of the time. She states that she is interested in considering restarting therapy. I recommended switching to the new preparation, Odefsey, if she chooses to restart therapy. I asked her to follow-up after lab work in 6 months.

## 2016-01-25 NOTE — Assessment & Plan Note (Signed)
Her blood pressure is elevated again here today and it sounds like she has been told that it is been elevated more recently. I suggested that she get a home blood pressure monitor and keep a log of blood pressures at various times of the day.

## 2016-01-25 NOTE — Telephone Encounter (Signed)
Verbal order from Dr. Megan Salon for valtrex.

## 2016-01-25 NOTE — Progress Notes (Signed)
Patient Active Problem List   Diagnosis Date Noted  . Human immunodeficiency virus (HIV) disease ([place]) 09/06/2006    Priority: High  . GENITAL HERPES 09/06/2006    Priority: Medium  . URI (upper respiratory infection) 11/27/2014  . Essential hypertension 09/16/2014  . 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 OTHER NEUTROPENIA 03/22/2009  . SYPHILIS NOS 09/06/2006  . Depression 09/06/2006  . BELL'S PALSY 09/06/2006  . PAP SMEAR, ABNORMAL 09/06/2006    Patient's Medications  New Prescriptions   EMTRICITABINE-RILPIVIR-TENOFOVIR AF (ODEFSEY) 200-25-25 MG TABS TABLET    Take 1 tablet by mouth daily.  Previous Medications   FLUTICASONE (FLONASE) 50 MCG/ACT NASAL SPRAY    Place 2 sprays into both nostrils daily.  Modified Medications   Modified Medication Previous Medication   VALACYCLOVIR (VALTREX) 500 MG TABLET valACYclovir (VALTREX) 500 MG tablet      Take 1 tablet (500 mg total) by mouth 2 (two) times daily. For 5 days, as needed for outbreaks.    Take 500 mg by mouth 2 (two) times daily. For 5 days, as needed for outbreaks.  Discontinued Medications   COMPLERA 200-25-300 MG TABS    TAKE 1 TABLET BY MOUTH DAILY.   MECLIZINE (ANTIVERT) 25 MG TABLET    Take 1 tablet (25 mg total) by mouth 3 (three) times daily as needed for dizziness.    Subjective: Stephanie Espinoza is in for her first visit in one year. At the time of her visit last March she said that she was going to restart Complera but she never did. She states that all of her children are now out of the house. She quit working for a while but she is now back working at a [employer] firm. She states it is just her and her 2 dogs now and she is getting into her new routine. Again, she is considering restarting antiretroviral medication. She has recently been told that her blood pressure has been running high but she has not been put on any new medication. She is currently on no medications or supplements. She does not get any regular  exercise. She is feeling well and currently without any complaints.  Review of Systems: Review of Systems  Constitutional: Negative for fever, chills, weight loss, malaise/fatigue and diaphoresis.  HENT: Negative for sore throat.   Respiratory: Negative for cough, sputum production and shortness of breath.   Cardiovascular: Negative for chest pain.  Gastrointestinal: Negative for nausea, vomiting and diarrhea.  Genitourinary: Negative for dysuria and frequency.  Musculoskeletal: Negative for myalgias and joint pain.  Skin: Negative for rash.  Neurological: Negative for dizziness and headaches.  Psychiatric/Behavioral: Negative for depression and substance abuse. The patient is not nervous/anxious.     Past Medical History  Diagnosis Date  . Allergic rhinitis   . Anxiety   . Depression   . Bell's palsy after 1995 ?    no residual SE.  [name] History of drug abuse     drug usser [place].  cocaine  . Genital herpes   . Syphilis   . HIV (human immunodeficiency virus infection) ([place]) 1990    from sexual contact who is unknown  . Abnormal Pap smear of cervix before 1995    always a repeat back to normal except for 1 time and had colpo bioosy -resutls were normal    Social History  Substance Use Topics  . Smoking status: Former Smoker    Types: Cigarettes  Quit date: 03/01/1990  . Smokeless tobacco: Never Used  . Alcohol Use: No    Family History  Problem Relation Age of Onset  . Diabetes Father     Allergies  Allergen Reactions  . Haldol [Haloperidol] Other (See Comments)    Wipes out her memory  . Nevirapine     REACTION: Rash 10/08    Objective:  Filed Vitals:   01/25/16 1554  BP: 177/114  Pulse: 69  Temp: 98.7 F (37.1 C)  TempSrc: Oral  Height: 5\' 2"  (1.575 m)  Weight: 187 lb 8 oz (85.049 kg)   Body mass index is 34.29 kg/(m^2).  Physical Exam  Constitutional: She is oriented to person, place, and time.  She is in good spirits and joking with me  that she is my "problem child".  HENT:  Mouth/Throat: No oropharyngeal exudate.  Eyes: Conjunctivae are normal.  Cardiovascular: Normal rate and regular rhythm.   No murmur heard. Pulmonary/Chest: Breath sounds normal.  Abdominal: Soft. She exhibits no mass. There is no tenderness.  Musculoskeletal: Normal range of motion.  Neurological: She is alert and oriented to person, place, and time.  Skin: No rash noted.  Psychiatric: Mood and affect normal.    Lab Results Lab Results  Component Value Date   WBC 4.8 11/24/2014   HGB 12.7 11/24/2014   HCT 37.8 11/24/2014   MCV 83.0 11/24/2014   PLT 207.0 11/24/2014    Lab Results  Component Value Date   CREATININE 1.0 09/15/2014   BUN 12 09/15/2014   NA 142 09/15/2014   K 3.8 09/15/2014   CL 109 09/15/2014   CO2 23 09/15/2014    Lab Results  Component Value Date   ALT 14 09/15/2014   AST 20 09/15/2014   ALKPHOS 81 09/15/2014   BILITOT 0.9 09/15/2014    Lab Results  Component Value Date   CHOL 178 09/15/2014   HDL 33.30* 09/15/2014   LDLCALC 113* 09/15/2014   LDLDIRECT 102.1 03/02/2009   TRIG 161.0* 09/15/2014   CHOLHDL 5 09/15/2014    Lab Results HIV 1 RNA QUANT (copies/mL)  Date Value  01/11/2016 159*  12/21/2014 284*  11/24/2014 1085*   CD4 T CELL ABS (/uL)  Date Value  01/11/2016 450  12/21/2014 500  03/22/2014 540      Problem List Items Addressed This Visit      High   Human immunodeficiency virus (HIV) disease ([place])    Stephanie Espinoza continues to have a hard time believing that antiretroviral therapy will make her better. Fortunately her numbers have consistently shown that she is able to obtain reasonable control of her infection without treatment. Her viral load remains relatively low and her CD4 count has always been in the normal range. I talked her about the theoretical benefits of antiretroviral therapy even if her numbers look relatively good. There is concerned that ongoing, low level infection contributes  to systemic inflammation which can lead to bad things such as metabolic syndrome and cancer. I used the analogy of letting her car idle all of the time. She states that she is interested in considering restarting therapy. I recommended switching to the new preparation, Odefsey, if she chooses to restart therapy. I asked her to follow-up after lab work in 6 months.      Relevant Medications   emtricitabine-rilpivir-tenofovir AF (ODEFSEY) 200-25-25 MG TABS tablet   Other Relevant Orders   T-helper cell (CD4)- ([facility] only)   HIV 1 RNA quant-no reflex-bld   CBC  Comprehensive metabolic panel     Unprioritized   Essential hypertension - Primary    Her blood pressure is elevated again here today and it sounds like she has been told that it is been elevated more recently. I suggested that she get a home blood pressure monitor and keep a log of blood pressures at various times of the day.           Michel Bickers, MD [facility] for [facility] [phone] pager   [phone] cell 01/25/2016, 5:08 PM

## 2016-02-06 ENCOUNTER — Ambulatory Visit: Payer: 59 | Admitting: Internal Medicine

## 2016-02-20 ENCOUNTER — Telehealth: Payer: Self-pay | Admitting: Internal Medicine

## 2016-02-20 NOTE — Telephone Encounter (Signed)
Pt called stating that she needs to cancel her appt due to the time frame. Pt states she would like to speak to Dr Sharlet Salina regarding being told she has high blood pressure. I offered pt to come in to see another provider pt did not want to do that. And I also offered for pt to resch to another day. Pt did not want to do that either. Thank you!

## 2016-02-21 NOTE — Telephone Encounter (Signed)
Left message for patient to call back  

## 2016-02-21 NOTE — Telephone Encounter (Signed)
Pt return call to the assistant. Please call her back around 130pm today  # [phone]

## 2016-02-21 NOTE — Telephone Encounter (Signed)
Patient will schedule an office visit and keep a log of her blood pressure for a few days.

## 2016-02-23 ENCOUNTER — Ambulatory Visit: Payer: 59 | Admitting: Internal Medicine

## 2016-05-27 ENCOUNTER — Telehealth: Payer: Self-pay | Admitting: Geriatric Medicine

## 2016-05-27 ENCOUNTER — Encounter: Payer: Self-pay | Admitting: Internal Medicine

## 2016-05-27 ENCOUNTER — Ambulatory Visit (INDEPENDENT_AMBULATORY_CARE_PROVIDER_SITE_OTHER): Payer: 59 | Admitting: Internal Medicine

## 2016-05-27 DIAGNOSIS — B20 Human immunodeficiency virus [HIV] disease: Secondary | ICD-10-CM

## 2016-05-27 DIAGNOSIS — R42 Dizziness and giddiness: Secondary | ICD-10-CM | POA: Diagnosis not present

## 2016-05-27 DIAGNOSIS — I10 Essential (primary) hypertension: Secondary | ICD-10-CM | POA: Diagnosis not present

## 2016-05-27 DIAGNOSIS — J069 Acute upper respiratory infection, unspecified: Secondary | ICD-10-CM

## 2016-05-27 MED ORDER — FLUTICASONE PROPIONATE 50 MCG/ACT NA SUSP: 2.0000 | Freq: Every day | NASAL | 6 refills | Status: DC

## 2016-05-27 MED ORDER — ALBUTEROL SULFATE HFA 108 (90 BASE) MCG/ACT IN AERS: 2.0000 | INHALATION_SPRAY | Freq: Four times a day (QID) | RESPIRATORY_TRACT | 2 refills | Status: DC | PRN

## 2016-05-27 MED ORDER — HYDROCODONE-HOMATROPINE 5-1.5 MG/5ML PO SYRP: 5.0000 mL | ORAL_SOLUTION | Freq: Three times a day (TID) | ORAL | 0 refills | Status: DC | PRN

## 2016-05-27 MED ORDER — HYDROCHLOROTHIAZIDE 25 MG PO TABS: 25.0000 mg | ORAL_TABLET | Freq: Every day | ORAL | 3 refills | Status: DC

## 2016-05-27 NOTE — Telephone Encounter (Signed)
Sent in hctz to take once daily. Still needs visit in 1 month.

## 2016-05-27 NOTE — Patient Instructions (Signed)
We have sent in the flonase for the sinuses. Use 2 sprays in each nose daily.   We have also sent in the albuterol inhaler that you can use for the breathing.   We have given you a prescription for the cough syrup to use as needed for the cough.   You do not need an antibiotic today as you likely have a virus. These typically last 7-10 days and usually around day 4-5 is the worst the symptoms get so your symptoms might worsen still.   We need you to return in about 1 month for check on your blood pressure. It is running too high and it could harm your health if this is not just caused by the cold medication.    Upper Respiratory Infection, Adult Most upper respiratory infections (URIs) are a viral infection of the air passages leading to the lungs. A URI affects the nose, throat, and upper air passages. The most common type of URI is nasopharyngitis and is typically referred to as "the common cold." URIs run their course and usually go away on their own. Most of the time, a URI does not require medical attention, but sometimes a bacterial infection in the upper airways can follow a viral infection. This is called a secondary infection. Sinus and middle ear infections are common types of secondary upper respiratory infections. Bacterial pneumonia can also complicate a URI. A URI can worsen asthma and chronic obstructive pulmonary disease (COPD). Sometimes, these complications can require emergency medical care and may be life threatening.  CAUSES Almost all URIs are caused by viruses. A virus is a type of germ and can spread from one person to another.  RISKS FACTORS You may be at risk for a URI if:   You smoke.   You have chronic heart or lung disease.  You have a weakened defense (immune) system.   You are very young or very old.   You have nasal allergies or asthma.  You work in crowded or poorly ventilated areas.  You work in health care facilities or schools. SIGNS AND  SYMPTOMS  Symptoms typically develop 2-3 days after you come in contact with a cold virus. Most viral URIs last 7-10 days. However, viral URIs from the influenza virus (flu virus) can last 14-18 days and are typically more severe. Symptoms may include:   Runny or stuffy (congested) nose.   Sneezing.   Cough.   Sore throat.   Headache.   Fatigue.   Fever.   Loss of appetite.   Pain in your forehead, behind your eyes, and over your cheekbones (sinus pain).  Muscle aches.  DIAGNOSIS  Your health care provider may diagnose a URI by:  Physical exam.  Tests to check that your symptoms are not due to another condition such as:  Strep throat.  Sinusitis.  Pneumonia.  Asthma. TREATMENT  A URI goes away on its own with time. It cannot be cured with medicines, but medicines may be prescribed or recommended to relieve symptoms. Medicines may help:  Reduce your fever.  Reduce your cough.  Relieve nasal congestion. HOME CARE INSTRUCTIONS   Take medicines only as directed by your health care provider.   Gargle warm saltwater or take cough drops to comfort your throat as directed by your health care provider.  Use a warm mist humidifier or inhale steam from a shower to increase air moisture. This may make it easier to breathe.  Drink enough fluid to keep your urine clear or  pale yellow.   Eat soups and other clear broths and maintain good nutrition.   Rest as needed.   Return to work when your temperature has returned to normal or as your health care provider advises. You may need to stay home longer to avoid infecting others. You can also use a face mask and careful hand washing to prevent spread of the virus.  Increase the usage of your inhaler if you have asthma.   Do not use any tobacco products, including cigarettes, chewing tobacco, or electronic cigarettes. If you need help quitting, ask your health care provider. PREVENTION  The best way to  protect yourself from getting a cold is to practice good hygiene.   Avoid oral or hand contact with people with cold symptoms.   Wash your hands often if contact occurs.  There is no clear evidence that vitamin C, vitamin E, echinacea, or exercise reduces the chance of developing a cold. However, it is always recommended to get plenty of rest, exercise, and practice good nutrition.  SEEK MEDICAL CARE IF:   You are getting worse rather than better.   Your symptoms are not controlled by medicine.   You have chills.  You have worsening shortness of breath.  You have brown or red mucus.  You have yellow or brown nasal discharge.  You have pain in your face, especially when you bend forward.  You have a fever.  You have swollen neck glands.  You have pain while swallowing.  You have white areas in the back of your throat. SEEK IMMEDIATE MEDICAL CARE IF:   You have severe or persistent:  Headache.  Ear pain.  Sinus pain.  Chest pain.  You have chronic lung disease and any of the following:  Wheezing.  Prolonged cough.  Coughing up blood.  A change in your usual mucus.  You have a stiff neck.  You have changes in your:  Vision.  Hearing.  Thinking.  Mood. MAKE SURE YOU:   Understand these instructions.  Will watch your condition.  Will get help right away if you are not doing well or get worse.   This information is not intended to replace advice given to you by your health care provider. Make sure you discuss any questions you have with your health care provider.   Document Released: 04/16/2001 Document Revised: 03/07/2015 Document Reviewed: 01/26/2014 Elsevier Interactive Patient Education [employer].

## 2016-05-27 NOTE — Telephone Encounter (Signed)
Patient says her pressure has been up like that for over a year and she would like for you to go ahead and send in a blood pressure medication today. Please advise, thanks.

## 2016-05-27 NOTE — Progress Notes (Signed)
   Subjective:    Patient ID: Stephanie Espinoza, female    DOB: 11/03/1956, 60 y.o.   MRN: [id number]  HPI The patient is a 60 YO female coming in for acute visit for hoarseness and fevers and chills. She is also having cough, sinus drainage and ear pain and pressure. Started 3 days ago while traveling to [place]. She has tried excedrin and cold medicine and claritin which did not help. She is not taking her flonase due to being out. She is having cough which is non-productive. Overall symptoms are stable to mildly worsened since onset. Has history of HIV (not on meds, last CD4 count 450).  Review of Systems  Constitutional: Positive for chills and fever. Negative for activity change, appetite change, fatigue and unexpected weight change.  HENT: Positive for congestion, postnasal drip, rhinorrhea and sinus pressure. Negative for ear discharge, ear pain, sore throat and trouble swallowing.   Eyes: Negative.   Respiratory: Positive for cough and shortness of breath. Negative for chest tightness and wheezing.   Cardiovascular: Negative for chest pain, palpitations and leg swelling.  Gastrointestinal: Negative for abdominal distention, abdominal pain, constipation and diarrhea.  Neurological: Negative.       Objective:   Physical Exam  Constitutional: She is oriented to person, place, and time. She appears well-developed and well-nourished.  HENT:  Head: Normocephalic and atraumatic.  Right Ear: External ear normal.  Left Ear: External ear normal.  Oropharynx with some mild erythema, nose with clear to yellow crusting. TMS normal  Eyes: EOM are normal.  Neck: Normal range of motion.  Mild shotty LAD in the neck  Cardiovascular: Normal rate and regular rhythm.   Pulmonary/Chest: Effort normal and breath sounds normal. No respiratory distress. She has no wheezes. She has no rales.  Abdominal: Soft. Bowel sounds are normal.  Lymphadenopathy:    She has cervical adenopathy.  Neurological: She is  alert and oriented to person, place, and time. Coordination normal.  Skin: Skin is warm and dry.   Vitals:   05/27/16 1406 05/27/16 1438  BP: (!) 178/102 (!) 190/110  Pulse: 88   Resp: 20   Temp: 99 F (37.2 C)   TempSrc: Oral   SpO2: 96%   Weight: 180 lb (81.6 kg)   Height: 5\' 2"  (1.575 m)       Assessment & Plan:

## 2016-05-27 NOTE — Assessment & Plan Note (Signed)
Not on treatment and last viral load 150 with CD4 450 which is acceptable. She is not wanting to start treatment now.

## 2016-05-27 NOTE — Addendum Note (Signed)
Addended by: Pricilla Holm A on: 05/27/2016 03:22 PM   Modules accepted: Orders

## 2016-05-27 NOTE — Progress Notes (Signed)
Pre visit review using our clinic review tool, if applicable. No additional management support is needed unless otherwise documented below in the visit note. 

## 2016-05-27 NOTE — Assessment & Plan Note (Addendum)
She has not been into our office for visit in >1 year. She is asked to return for physical in 1 month and avoid cold medication and NSAIDs. She will likely need blood pressure medication then. No chest pains, headaches, abdominal pain or nausea. Addendum: she was willing to start BP medication and hctz sent in for her to take.

## 2016-05-27 NOTE — Telephone Encounter (Signed)
Patient aware.

## 2016-05-27 NOTE — Assessment & Plan Note (Signed)
Appears to be viral, no indication for antibiotics today. Rx for flonase, hycodan, albuterol today. Reminded of the typical course and expectations of treatment.

## 2016-06-04 ENCOUNTER — Telehealth: Payer: Self-pay | Admitting: Internal Medicine

## 2016-06-04 MED ORDER — AMOXICILLIN-POT CLAVULANATE 875-125 MG PO TABS: 1.0000 | ORAL_TABLET | Freq: Two times a day (BID) | ORAL | 0 refills | Status: DC

## 2016-06-04 NOTE — Telephone Encounter (Signed)
Have called in augmentin to her pharmacy. 1 pill twice a day for 1 week.

## 2016-06-04 NOTE — Telephone Encounter (Signed)
Pt called in and said that from her visit last week she is not getting any better and would like to see if dr Stephanie Espinoza could call in an antibiotic for her?       Pharmacy - [facility] on [address]

## 2016-06-04 NOTE — Telephone Encounter (Signed)
Patient aware.

## 2016-06-11 ENCOUNTER — Other Ambulatory Visit (INDEPENDENT_AMBULATORY_CARE_PROVIDER_SITE_OTHER): Payer: 59

## 2016-06-11 ENCOUNTER — Ambulatory Visit (INDEPENDENT_AMBULATORY_CARE_PROVIDER_SITE_OTHER)
Admission: RE | Admit: 2016-06-11 | Discharge: 2016-06-11 | Disposition: A | Discharge status: Home / Self Care | Payer: 59 | Source: Ambulatory Visit | Attending: Internal Medicine | Admitting: Internal Medicine

## 2016-06-11 ENCOUNTER — Encounter: Payer: Self-pay | Admitting: Internal Medicine

## 2016-06-11 ENCOUNTER — Ambulatory Visit (INDEPENDENT_AMBULATORY_CARE_PROVIDER_SITE_OTHER): Payer: 59 | Admitting: Internal Medicine

## 2016-06-11 VITALS — BP 110/78 | HR 83 | Temp 98.3°F | Resp 12 | Ht 62.0 in | Wt 176.8 lb

## 2016-06-11 DIAGNOSIS — M545 Low back pain, unspecified: Secondary | ICD-10-CM

## 2016-06-11 DIAGNOSIS — R5383 Other fatigue: Secondary | ICD-10-CM

## 2016-06-11 LAB — COMPREHENSIVE METABOLIC PANEL
ALT: 17 U/L (ref 0–35)
AST: 19 U/L (ref 0–37)
Albumin: 3.7 g/dL (ref 3.5–5.2)
Alkaline Phosphatase: 85 U/L (ref 39–117)
BUN: 10 mg/dL (ref 6–23)
CO2: 32 mEq/L (ref 19–32)
Calcium: 9.8 mg/dL (ref 8.4–10.5)
Chloride: 100 mEq/L (ref 96–112)
Creatinine, Ser: 0.97 mg/dL (ref 0.40–1.20)
GFR: 75.92 mL/min (ref >60.00)
Glucose, Bld: 83 mg/dL (ref 70–99)
Potassium: 3.1 mEq/L — ABNORMAL LOW (ref 3.5–5.1)
Sodium: 140 mEq/L (ref 135–145)
Total Bilirubin: 0.9 mg/dL (ref 0.2–1.2)
Total Protein: 8.5 g/dL — ABNORMAL HIGH (ref 6.0–8.3)

## 2016-06-11 LAB — T4, FREE: Free T4: 0.79 ng/dL (ref 0.60–1.60)

## 2016-06-11 LAB — HEMOGLOBIN A1C: Hgb A1c MFr Bld: 5.9 % (ref 4.6–6.5)

## 2016-06-11 LAB — TSH: TSH: 1.62 u[IU]/mL (ref 0.35–4.50)

## 2016-06-11 MED ORDER — CYCLOBENZAPRINE HCL 5 MG PO TABS: 5.0000 mg | ORAL_TABLET | Freq: Three times a day (TID) | ORAL | 1 refills | Status: DC | PRN

## 2016-06-11 NOTE — Patient Instructions (Addendum)
We will check the x-ray of the low back and call you back with the results. Depending on the results we will move forward.   We are also checking the labs today for diabetes and kidney and liver and thyroid.   We have sent in flexeril for the back pain that you can use as needed up to 3 times per day. It does not typically make people drowsy but can in some people so make sure you know how it will affect you before you drive.

## 2016-06-11 NOTE — Progress Notes (Signed)
Pre visit review using our clinic review tool, if applicable. No additional management support is needed unless otherwise documented below in the visit note. 

## 2016-06-12 ENCOUNTER — Other Ambulatory Visit: Payer: Self-pay | Admitting: Internal Medicine

## 2016-06-12 DIAGNOSIS — M545 Low back pain, unspecified: Secondary | ICD-10-CM

## 2016-06-12 DIAGNOSIS — G8929 Other chronic pain: Secondary | ICD-10-CM | POA: Insufficient documentation

## 2016-06-12 NOTE — Progress Notes (Signed)
   Subjective:    Patient ID: Stephanie Espinoza, female    DOB: 10/15/1956, 60 y.o.   MRN: [id number]  HPI The patient is a 60 YO female coming in for low back pain. Going on for about 1 year. She has never brought this up before. Thought it was just some muscles in the beginning as it would ache during the day off and on. She switched pillow and mattress to help with the pain. This did not help much. She is now having pain most days and sometimes up to 7/10 intensity. Denies any injury or old injury to the back. 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 Walking and standing hurt more. She is okay when sitting but cannot for too long without pain. Using tylenol and ibuprofen with some relief. Also using heat and ice which help for a short time. No numbness in either leg or pain in her legs, no weakness in her legs.   Review of Systems  Constitutional: Negative for activity change, appetite change, fatigue and unexpected weight change.  HENT: Negative for ear discharge, ear pain, sore throat and trouble swallowing.   Respiratory: Negative for chest tightness and wheezing.   Cardiovascular: Negative for chest pain, palpitations and leg swelling.  Gastrointestinal: Negative for abdominal distention, abdominal pain, constipation and diarrhea.  Musculoskeletal: Positive for arthralgias and back pain. Negative for gait problem, myalgias and neck pain.  Neurological: Negative.       Objective:   Physical Exam  Constitutional: She is oriented to person, place, and time. She appears well-developed and well-nourished.  HENT:  Head: Normocephalic and atraumatic.  Right Ear: External ear normal.  Left Ear: External ear normal.  Eyes: EOM are normal.  Neck: Normal range of motion.  Cardiovascular: Normal rate and regular rhythm.   Pulmonary/Chest: Effort normal and breath sounds normal. No respiratory distress. She has no wheezes. She has no rales.  Abdominal: Soft. Bowel sounds are  normal.  Musculoskeletal: She exhibits tenderness.  Pain in the lumbar region band across the area.   Neurological: She is alert and oriented to person, place, and time. Coordination normal.  Skin: Skin is warm and dry.   Vitals:   06/11/16 1608  BP: 110/78  Pulse: 83  Resp: 12  Temp: 98.3 F (36.8 C)  TempSrc: Oral  SpO2: 97%  Weight: 176 lb 12.8 oz (80.2 kg)  Height: 5\' 2"  (1.575 m)      Assessment & Plan:

## 2016-06-12 NOTE — Assessment & Plan Note (Signed)
Checking x-ray lumbar for arthritis. If no obvious etiology will refer to neurosurgery. Rx for flexeril today given for the pain.

## 2016-08-06 ENCOUNTER — Other Ambulatory Visit: Payer: 59

## 2016-08-06 ENCOUNTER — Ambulatory Visit (INDEPENDENT_AMBULATORY_CARE_PROVIDER_SITE_OTHER): Payer: 59 | Admitting: Internal Medicine

## 2016-08-06 ENCOUNTER — Encounter: Payer: Self-pay | Admitting: Internal Medicine

## 2016-08-06 DIAGNOSIS — R300 Dysuria: Secondary | ICD-10-CM

## 2016-08-06 LAB — POC URINALSYSI DIPSTICK (AUTOMATED)
Bilirubin, UA: NEGATIVE
Blood, UA: 3
Glucose, UA: NEGATIVE
Ketones, UA: NEGATIVE
Nitrite, UA: POSITIVE
Protein, UA: 2
Spec Grav, UA: >=1.03
Urobilinogen, UA: 0.2
pH, UA: 6

## 2016-08-06 MED ORDER — NITROFURANTOIN MONOHYD MACRO 100 MG PO CAPS: 100.0000 mg | ORAL_CAPSULE | Freq: Two times a day (BID) | ORAL | 0 refills | Status: DC

## 2016-08-06 NOTE — Patient Instructions (Signed)
You do have a bladder infection and we have sent in macrobid for it. Take 1 pill twice a day for 1 week. This will clear it up.   Keep drinking plenty of liquids to help clear the kidneys.    Urinary Tract Infection Urinary tract infections (UTIs) can develop anywhere along your urinary tract. Your urinary tract is your body's drainage system for removing wastes and extra water. Your urinary tract includes two kidneys, two ureters, a bladder, and a urethra. Your kidneys are a pair of bean-shaped organs. Each kidney is about the size of your fist. They are located below your ribs, one on each side of your spine. CAUSES Infections are caused by microbes, which are microscopic organisms, including fungi, viruses, and bacteria. These organisms are so small that they can only be seen through a microscope. Bacteria are the microbes that most commonly cause UTIs. SYMPTOMS  Symptoms of UTIs may vary by age and gender of the patient and by the location of the infection. Symptoms in young women typically include a frequent and intense urge to urinate and a painful, burning feeling in the bladder or urethra during urination. Older women and men are more likely to be tired, shaky, and weak and have muscle aches and abdominal pain. A fever may mean the infection is in your kidneys. Other symptoms of a kidney infection include pain in your back or sides below the ribs, nausea, and vomiting. DIAGNOSIS To diagnose a UTI, your caregiver will ask you about your symptoms. Your caregiver will also ask you to provide a urine sample. The urine sample will be tested for bacteria and white blood cells. White blood cells are made by your body to help fight infection. TREATMENT  Typically, UTIs can be treated with medication. Because most UTIs are caused by a bacterial infection, they usually can be treated with the use of antibiotics. The choice of antibiotic and length of treatment depend on your symptoms and the type of  bacteria causing your infection. HOME CARE INSTRUCTIONS  If you were prescribed antibiotics, take them exactly as your caregiver instructs you. Finish the medication even if you feel better after you have only taken some of the medication.  Drink enough water and fluids to keep your urine clear or pale yellow.  Avoid caffeine, tea, and carbonated beverages. They tend to irritate your bladder.  Empty your bladder often. Avoid holding urine for long periods of time.  Empty your bladder before and after sexual intercourse.  After a bowel movement, women should cleanse from front to back. Use each tissue only once. SEEK MEDICAL CARE IF:   You have back pain.  You develop a fever.  Your symptoms do not begin to resolve within 3 days. SEEK IMMEDIATE MEDICAL CARE IF:   You have severe back pain or lower abdominal pain.  You develop chills.  You have nausea or vomiting.  You have continued burning or discomfort with urination. MAKE SURE YOU:   Understand these instructions.  Will watch your condition.  Will get help right away if you are not doing well or get worse.   This information is not intended to replace advice given to you by your health care provider. Make sure you discuss any questions you have with your health care provider.   Document Released: 07/31/2005 Document Revised: 07/12/2015 Document Reviewed: 11/29/2011 Elsevier Interactive Patient Education [employer].

## 2016-08-06 NOTE — Assessment & Plan Note (Addendum)
Checked U/A in the office and signs of infection. Rx for macrobid and sent for culture to determine susceptibility. Note for work given at visit.

## 2016-08-06 NOTE — Progress Notes (Signed)
Pre visit review using our clinic review tool, if applicable. No additional management support is needed unless otherwise documented below in the visit note. 

## 2016-08-06 NOTE — Progress Notes (Signed)
   Subjective:    Patient ID: Stephanie Espinoza, female    DOB: May 25, 1956, 60 y.o.   MRN: [id number]  HPI The patient is a 60 YO female coming in for possible UTI. Symptoms started over the weekend and worsening since onset. She has urinary hesitancy for the last 2 days with decreased output. Some mild fevers on Sunday. Suprapubic discomfort. No back pain. No nausea or vomiting. Good appetite and drinking plenty of liquids.   Review of Systems  Constitutional: Positive for fever. Negative for activity change, appetite change, chills and unexpected weight change.  Respiratory: Negative.   Cardiovascular: Negative.   Gastrointestinal: Positive for abdominal pain. Negative for abdominal distention, constipation, diarrhea, nausea and vomiting.  Genitourinary: Positive for difficulty urinating, dysuria and frequency. Negative for flank pain, hematuria, pelvic pain and urgency.  Musculoskeletal: Negative.       Objective:   Physical Exam  Constitutional: She is oriented to person, place, and time. She appears well-developed and well-nourished.  HENT:  Head: Normocephalic and atraumatic.  Eyes: EOM are normal.  Neck: Normal range of motion.  Cardiovascular: Normal rate and regular rhythm.   Pulmonary/Chest: Effort normal and breath sounds normal.  Abdominal: Soft. Bowel sounds are normal. She exhibits no distension. There is tenderness. There is no rebound and no guarding.  Suprapubic discomfort  Neurological: She is alert and oriented to person, place, and time.  Skin: Skin is warm and dry.   Vitals:   08/06/16 1556  BP: 118/78  Pulse: 93  Temp: 98.3 F (36.8 C)  SpO2: 99%  Weight: 173 lb (78.5 kg)      Assessment & Plan:

## 2016-08-09 ENCOUNTER — Telehealth: Payer: Self-pay | Admitting: Emergency Medicine

## 2016-08-09 LAB — URINE CULTURE

## 2016-08-09 NOTE — Telephone Encounter (Signed)
Pt came in for an appt on tues 9/3. She was prescribed some medication that caused her to sleep allday Wednesday. She is wondering if a note for work could be faxed over to her work. Fax # is [phone]. Please advise thanks.

## 2016-08-09 NOTE — Telephone Encounter (Signed)
Routing to dr crawford----please advise, thanks 

## 2016-08-12 ENCOUNTER — Ambulatory Visit: Payer: 59 | Admitting: Family Medicine

## 2016-08-13 NOTE — Telephone Encounter (Signed)
I will not be writing a letter that is not valid statement---I am resending to amy/cma to check with dr crawford to see if letter should be written

## 2016-08-13 NOTE — Telephone Encounter (Signed)
She was prescribed an antibiotic which is non-drowsy and should not have caused her to miss work. If you want to write one I'll sign it.

## 2016-08-14 ENCOUNTER — Other Ambulatory Visit (INDEPENDENT_AMBULATORY_CARE_PROVIDER_SITE_OTHER): Payer: 59

## 2016-08-14 ENCOUNTER — Telehealth: Payer: Self-pay | Admitting: Emergency Medicine

## 2016-08-14 DIAGNOSIS — R300 Dysuria: Secondary | ICD-10-CM

## 2016-08-14 LAB — URINALYSIS
Bilirubin Urine: NEGATIVE
Hgb urine dipstick: NEGATIVE
Leukocytes, UA: NEGATIVE
Nitrite: NEGATIVE
Specific Gravity, Urine: 1.02 (ref 1.000–1.030)
Urine Glucose: NEGATIVE
Urobilinogen, UA: 1 (ref 0.0–1.0)
pH: 6.5 (ref 5.0–8.0)

## 2016-08-14 NOTE — Telephone Encounter (Signed)
She can come to the lab to leave a sample to check for infection. If there are no signs of infection she may need another visit as there are other causes of blood in urine.

## 2016-08-14 NOTE — Telephone Encounter (Signed)
Pt called and stated she has finished her antibiotic. She  is still having blood in her urine and some discomfort when going to the bathroom. She wants to know what to do next. Please advise thanks.

## 2016-08-14 NOTE — Telephone Encounter (Signed)
Patient will come in to leave a sample.

## 2016-08-14 NOTE — Telephone Encounter (Signed)
We did not prescribe her anything that should have caused her to sleep all day.

## 2016-08-15 LAB — URINE CULTURE

## 2016-10-10 ENCOUNTER — Ambulatory Visit (INDEPENDENT_AMBULATORY_CARE_PROVIDER_SITE_OTHER): Payer: 59 | Admitting: Obstetrics & Gynecology

## 2016-10-10 ENCOUNTER — Other Ambulatory Visit: Payer: Self-pay | Admitting: Obstetrics & Gynecology

## 2016-10-10 ENCOUNTER — Encounter: Payer: Self-pay | Admitting: Obstetrics & Gynecology

## 2016-10-10 VITALS — BP 100/60 | HR 80 | Resp 16 | Ht 63.25 in | Wt 165.0 lb

## 2016-10-10 DIAGNOSIS — B20 Human immunodeficiency virus [HIV] disease: Secondary | ICD-10-CM | POA: Diagnosis not present

## 2016-10-10 DIAGNOSIS — Z0000 Encounter for general adult medical examination without abnormal findings: Secondary | ICD-10-CM

## 2016-10-10 DIAGNOSIS — R8299 Other abnormal findings in urine: Secondary | ICD-10-CM

## 2016-10-10 DIAGNOSIS — Z23 Encounter for immunization: Secondary | ICD-10-CM

## 2016-10-10 DIAGNOSIS — Z124 Encounter for screening for malignant neoplasm of cervix: Secondary | ICD-10-CM

## 2016-10-10 DIAGNOSIS — Z01419 Encounter for gynecological examination (general) (routine) without abnormal findings: Secondary | ICD-10-CM | POA: Diagnosis not present

## 2016-10-10 DIAGNOSIS — R8781 Cervical 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 (HPV) DNA test positive: Secondary | ICD-10-CM

## 2016-10-10 DIAGNOSIS — R82998 Other abnormal findings in urine: Secondary | ICD-10-CM

## 2016-10-10 DIAGNOSIS — Z1231 Encounter for screening mammogram for malignant neoplasm of breast: Secondary | ICD-10-CM

## 2016-10-10 DIAGNOSIS — Z202 Contact with and (suspected) exposure to infections with a predominantly sexual mode of transmission: Secondary | ICD-10-CM | POA: Diagnosis not present

## 2016-10-10 LAB — POCT URINALYSIS DIPSTICK
Bilirubin, UA: NEGATIVE
Glucose, UA: NEGATIVE
Ketones, UA: NEGATIVE
Nitrite, UA: NEGATIVE
Urobilinogen, UA: NEGATIVE
pH, UA: 5

## 2016-10-10 MED ORDER — VALACYCLOVIR HCL 500 MG PO TABS: ORAL_TABLET | ORAL | 12 refills | Status: DC

## 2016-10-10 MED ORDER — ACYCLOVIR 5 % EX CREA: 1.0000 "application " | TOPICAL_CREAM | CUTANEOUS | 2 refills | Status: DC

## 2016-10-10 NOTE — Progress Notes (Signed)
60 y.o. [id number] DivorcedAfrican AmericanF here for annual exam.  Doing well.  Had some back pain issues this year.  Worked on weight loss.  Down about 20 pounds.   Needs RX for Valtex.  Would like Zovirax cream because this helps most for her with topical pain associated with outbreak.  Knows to take Valtrex with this as well.    Current relationship she's been in for about a year.  She does tell pt about her status.  Uses condoms every time.    Followed by Dr. Megan Salon.     Patient's last menstrual period was 11/04/1997.          Sexually active: Yes.    The current method of family planning is condoms every time and post menopausal status.    Exercising: No.  The patient does not participate in regular exercise at present. Smoker:  no  Health Maintenance: Pap:  03/01/14 Neg. HR HPV:neg  History of abnormal Pap:  Yes, ASCUS 1995  MMG: 06/14/04 BIRADS1:neg.  DECLINES.  Pt clearly aware that this imaging has been proven to save lives.   Colonoscopy:  never BMD:   Never  TDaP: This is overdue.  Had it when she dislocated her finger.  Pneumonia vaccine(s):  09/2010  Zostavax:   No Hep C testing: 2008 negative Screening Labs: PCP   reports that she quit smoking about 26 years ago. Her smoking use included Cigarettes. She has never used smokeless tobacco. She reports that she does not drink alcohol or use drugs.  Past Medical History:  Diagnosis Date  . Abnormal Pap smear of cervix before 1995   always a repeat back to normal except for 1 time and had colpo bioosy -resutls were normal  . Allergic rhinitis   . Anxiety   . Bell's palsy after 1995 ?   no residual SE.  [name] Depression   . Genital herpes   . History of drug abuse    drug usser [place].  cocaine  . HIV (human immunodeficiency virus infection) ([place]) 1990   from sexual contact who is unknown  . Syphilis     Past Surgical History:  Procedure Laterality Date  . CESAREAN SECTION  1997    Current Outpatient  Prescriptions  Medication Sig Dispense Refill  . albuterol (PROVENTIL HFA;VENTOLIN HFA) 108 (90 Base) MCG/ACT inhaler Inhale 2 puffs into the lungs every 6 (six) hours as needed for wheezing or shortness of breath. 1 Inhaler 2  . cyclobenzaprine (FLEXERIL) 5 MG tablet Take 1 tablet (5 mg total) by mouth 3 (three) times daily as needed for muscle spasms. 60 tablet 1  . fluticasone (FLONASE) 50 MCG/ACT nasal spray Place 2 sprays into both nostrils daily. 16 g 6  . hydrochlorothiazide (HYDRODIURIL) 25 MG tablet Take 1 tablet (25 mg total) by mouth daily. 90 tablet 3  . valACYclovir (VALTREX) 500 MG tablet Take 1 tablet (500 mg total) by mouth 2 (two) times daily. For 5 days, as needed for outbreaks. 10 tablet 11   No current facility-administered medications for this visit.     Family History  Problem Relation Age of Onset  . Diabetes Father     ROS:  Pertinent items are noted in HPI.  Otherwise, a comprehensive ROS was negative.  Exam:   BP 100/60 (BP Location: Right Arm, Patient Position: Sitting, Cuff Size: Normal)   Pulse 80   Resp 16   Ht 5' 3.25" (1.607 m)   Wt 165 lb (74.8 kg)  LMP 11/04/1997   BMI 29.00 kg/m   Weight change: -19#  Height: 5' 3.25" (160.7 cm)  Ht Readings from Last 3 Encounters:  10/10/16 5' 3.25" (1.607 m)  06/11/16 5\' 2"  (1.575 m)  05/27/16 5\' 2"  (1.575 m)   General appearance: alert, cooperative and appears stated age Head: Normocephalic, without obvious abnormality, atraumatic Neck: no adenopathy, supple, symmetrical, trachea midline and thyroid normal to inspection and palpation Lungs: clear to auscultation bilaterally Breasts: normal appearance, no masses or tenderness Heart: regular rate and rhythm Abdomen: soft, non-tender; bowel sounds normal; no masses,  no organomegaly Extremities: extremities normal, atraumatic, no cyanosis or edema Skin: Skin color, texture, turgor normal. No rashes or lesions Lymph nodes: Cervical, supraclavicular, and  axillary nodes normal. No abnormal inguinal nodes palpated Neurologic: Grossly normal   Pelvic: External genitalia:  no lesions              Urethra:  normal appearing urethra with no masses, tenderness or lesions              Bartholins and Skenes: normal                 Vagina: normal appearing vagina with normal color and discharge, no lesions              Cervix: no lesions              Pap taken: Yes.   Bimanual Exam:  Uterus:  normal size, contour, position, consistency, mobility, non-tender              Adnexa: normal adnexa and no mass, fullness, tenderness               Rectovaginal: Confirms               Anus:  normal sphincter tone, no lesions  Chaperone was present for exam.  A:   Well Woman with normal exam PMP, no HRT H/o HIV since 1990.  Not on any anti-retroviral therapy.  Is followed by ID. H/O syphillis Remote hx of drug use H/O abnormal Pap smear with colpo and biopsies 1992.   Hypertension Abnormal urine testing today            P:         Mammogram highly recommended.  Pt states she WILL go as she recently had a friend diagnosed with breast cancer Pap and HR HPV obtained today RPR and Hep B Sag today GC/Chl obtained today Tdap updated today Declines colonoscopy.  States she did cologard with PCP.  Does not know results but will call to find out so she is aware.  States she doubts this would change her mind about doing a colonoscopy. Urine culture and micro pending Return annually or prn

## 2016-10-10 NOTE — Progress Notes (Signed)
Patient scheduled for 3D screening MMG at the [facility] on Tuesday 11/12/16 at 0730. Call to patient, patient requests appointment card be mailed to her because she is driving. Will mail per patient request.

## 2016-10-11 LAB — URINALYSIS, MICROSCOPIC ONLY
Casts: NONE SEEN [LPF]
Crystals: NONE SEEN [HPF]
Yeast: NONE SEEN [HPF]

## 2016-10-11 LAB — URINE CULTURE

## 2016-10-11 LAB — RPR

## 2016-10-11 LAB — HEPATITIS B SURFACE ANTIGEN: Hepatitis B Surface Ag: NEGATIVE

## 2016-10-14 ENCOUNTER — Telehealth: Payer: Self-pay | Admitting: Obstetrics & Gynecology

## 2016-10-14 MED ORDER — AMOXICILLIN 500 MG PO CAPS: 500.0000 mg | ORAL_CAPSULE | Freq: Two times a day (BID) | ORAL | 0 refills | Status: DC

## 2016-10-14 NOTE — Addendum Note (Signed)
Addended by: Megan Salon on: 10/14/2016 09:39 AM   Modules accepted: Orders

## 2016-10-14 NOTE — Telephone Encounter (Signed)
Patient says she spoke with someone this morning about her results.  States she has a few questions and would like to speak with someone.

## 2016-10-14 NOTE — Telephone Encounter (Signed)
Spoke with patient. Patient states she was called about lab results this morning. Patient states she was treated for something similar back in October 2017 and wanted to know if this was the same thing she was being treated for this time. Reviewed patient urine culture dated 10/3 -advised was for [facility], advised of results as seen below, treated for Group B strep. Patient thankful for return call, verbalizes understanding and is agreeable.   Routing to provider for final review. Patient is agreeable to disposition. Will close encounter.    Notes Recorded by Polly Cobia, CMA on 10/14/2016 at 10:28 AM EST Pt notified. Verbalized understanding. Nurse visit scheduled 12/20 @8am  ------  Notes Recorded by Megan Salon, MD on 10/14/2016 at 9:39 AM EST Please let pt know urine culture showed Group B strep. Her urine micro had bacteria and white cells in it. I'd like to treat this with Amoxicillin 500mg  BID x 5 days. Rx has been sent to pharmacy. I'd like to repeat the urine culture in 10 days. Order has been placed for this as well.   Also, her Hep B Sag and RPR was negative.   Pap is still pending. Thanks.

## 2016-10-14 NOTE — Addendum Note (Signed)
Addended by: Megan Salon on: 10/14/2016 09:40 AM   Modules accepted: Orders

## 2016-10-16 LAB — GC/CHLAMYDIA PROBE AMP, TP VIAL
Chlamydia Probe Amp: NOT DETECTED
GC Probe Amp: NOT DETECTED

## 2016-10-17 LAB — PAP IG AND HPV HIGH-RISK: HPV DNA High Risk: DETECTED — AB

## 2016-10-18 DIAGNOSIS — R8781 Cervical 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 (HPV) DNA test positive: Secondary | ICD-10-CM | POA: Insufficient documentation

## 2016-10-18 NOTE — Addendum Note (Signed)
Addended by: Megan Salon on: 10/18/2016 04:42 PM   Modules accepted: Orders

## 2016-10-22 LAB — HPV TYPE 16/18
HPV Genotype, 16: NOT DETECTED
HPV Genotype, 18: NOT DETECTED

## 2016-10-23 ENCOUNTER — Ambulatory Visit (INDEPENDENT_AMBULATORY_CARE_PROVIDER_SITE_OTHER): Payer: 59

## 2016-10-23 DIAGNOSIS — R82998 Other abnormal findings in urine: Secondary | ICD-10-CM

## 2016-10-23 DIAGNOSIS — R8299 Other abnormal findings in urine: Secondary | ICD-10-CM | POA: Diagnosis not present

## 2016-10-23 NOTE — Progress Notes (Signed)
Patient here for TOC urine culture. Per patient, feeling better and has completed course of antibiotics. Urine obtained and sent to the lab. Routing to provider and closing encounter.

## 2016-10-24 ENCOUNTER — Telehealth: Payer: Self-pay | Admitting: Obstetrics & Gynecology

## 2016-10-24 LAB — URINE CULTURE: Organism ID, Bacteria: NO GROWTH

## 2016-10-24 NOTE — Telephone Encounter (Signed)
Spoke with patient. Patient states the return call was  from appointment reminder. Patient states she did see recent MyChart message from Dr. Sabra Heck about results. No further f/u needed. Patient thankful for return call.   Routing to provider for final review. Patient is agreeable to disposition. Will close encounter.

## 2016-10-24 NOTE — Telephone Encounter (Signed)
Patient said she is returning a call from this afternoon. No open telephone notes?

## 2016-11-05 ENCOUNTER — Ambulatory Visit (INDEPENDENT_AMBULATORY_CARE_PROVIDER_SITE_OTHER)
Admission: RE | Admit: 2016-11-05 | Discharge: 2016-11-05 | Disposition: A | Discharge status: Home / Self Care | Payer: 59 | Source: Ambulatory Visit | Attending: Internal Medicine | Admitting: Internal Medicine

## 2016-11-05 ENCOUNTER — Telehealth: Payer: Self-pay | Admitting: Obstetrics & Gynecology

## 2016-11-05 ENCOUNTER — Encounter: Payer: Self-pay | Admitting: Obstetrics and Gynecology

## 2016-11-05 ENCOUNTER — Other Ambulatory Visit: Payer: 59

## 2016-11-05 ENCOUNTER — Ambulatory Visit (INDEPENDENT_AMBULATORY_CARE_PROVIDER_SITE_OTHER): Payer: 59 | Admitting: Obstetrics and Gynecology

## 2016-11-05 ENCOUNTER — Ambulatory Visit (INDEPENDENT_AMBULATORY_CARE_PROVIDER_SITE_OTHER): Payer: 59 | Admitting: Internal Medicine

## 2016-11-05 ENCOUNTER — Encounter: Payer: Self-pay | Admitting: Internal Medicine

## 2016-11-05 ENCOUNTER — Telehealth: Payer: Self-pay | Admitting: Emergency Medicine

## 2016-11-05 ENCOUNTER — Other Ambulatory Visit: Payer: Self-pay | Admitting: Internal Medicine

## 2016-11-05 VITALS — BP 110/70 | HR 101 | Temp 98.6°F | Resp 14 | Ht 63.5 in | Wt 160.0 lb

## 2016-11-05 VITALS — BP 92/60 | HR 88 | Resp 14 | Wt 161.0 lb

## 2016-11-05 DIAGNOSIS — N952 Postmenopausal atrophic vaginitis: Secondary | ICD-10-CM | POA: Diagnosis not present

## 2016-11-05 DIAGNOSIS — B20 Human immunodeficiency virus [HIV] disease: Secondary | ICD-10-CM | POA: Diagnosis not present

## 2016-11-05 DIAGNOSIS — R059 Cough, unspecified: Secondary | ICD-10-CM

## 2016-11-05 DIAGNOSIS — R05 Cough: Secondary | ICD-10-CM

## 2016-11-05 DIAGNOSIS — N930 Postcoital and contact bleeding: Secondary | ICD-10-CM | POA: Diagnosis not present

## 2016-11-05 MED ORDER — DOXYCYCLINE HYCLATE 100 MG PO TABS: 100.0000 mg | ORAL_TABLET | Freq: Two times a day (BID) | ORAL | 0 refills | Status: DC

## 2016-11-05 MED ORDER — ESTRADIOL 10 MCG VA TABS: 1.0000 | ORAL_TABLET | VAGINAL | 1 refills | Status: DC

## 2016-11-05 NOTE — Progress Notes (Signed)
GYNECOLOGY  VISIT   HPI: 61 y.o.   Divorced  [place] American  female   [id number] with Patient's last menstrual period was 11/04/1997.   here c/o bleeding with intercourse. Started 35 days ago, sexually active 1-2 x a month. No pain with intercourse, slight irritation after intercourse, notices slight redness, pink d/c one time after intercourse. No blood on the sheets. Using a lubricant with intercourse. Last time was 2 days ago. She felt sore at the opening of her vagina.     Recent pap was negative with +HPV, negative for 16/18/45.   GYNECOLOGIC HISTORY: Patient's last menstrual period was 11/04/1997. Contraception:post menopause  Menopausal hormone therapy: none         OB History    Gravida Para Term Preterm AB Living   4 4 4     4    SAB TAB Ectopic Multiple Live Births           4         Patient Active Problem List   Diagnosis Date Noted  . Cough 11/05/2016  . 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 Dysuria 08/06/2016  . Low back pain 06/12/2016  . Essential hypertension 09/16/2014  . 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 OTHER NEUTROPENIA 03/22/2009  . Human immunodeficiency virus (HIV) disease ([place]) 09/06/2006  . GENITAL HERPES 09/06/2006  . SYPHILIS NOS 09/06/2006  . Depression 09/06/2006  . BELL'S PALSY 09/06/2006  . PAP SMEAR, ABNORMAL 09/06/2006    Past Medical History:  Diagnosis Date  . Abnormal Pap smear of cervix before 1995   always a repeat back to normal except for 1 time and had colpo bioosy -resutls were normal  . Allergic rhinitis   . Anxiety   . Bell's palsy after 1995 ?   no residual SE.  [name] Depression   . Genital herpes   . History of drug abuse    drug usser [place].  cocaine  . HIV (human immunodeficiency virus infection) ([place]) 1990   from sexual contact who is unknown  . Syphilis     Past Surgical History:  Procedure Laterality Date  . CESAREAN SECTION  1997    Current Outpatient Prescriptions   Medication Sig Dispense Refill  . acyclovir cream (ZOVIRAX) 5 % Apply 1 application topically every 3 (three) hours. May use for up to 7 days 15 g 2  . albuterol (PROVENTIL HFA;VENTOLIN HFA) 108 (90 Base) MCG/ACT inhaler Inhale 2 puffs into the lungs every 6 (six) hours as needed for wheezing or shortness of breath. 1 Inhaler 2  . amoxicillin (AMOXIL) 500 MG capsule     . cyclobenzaprine (FLEXERIL) 5 MG tablet Take 1 tablet (5 mg total) by mouth 3 (three) times daily as needed for muscle spasms. 60 tablet 1  . fluticasone (FLONASE) 50 MCG/ACT nasal spray Place 2 sprays into both nostrils daily. 16 g 6  . hydrochlorothiazide (HYDRODIURIL) 25 MG tablet Take 1 tablet (25 mg total) by mouth daily. 90 tablet 3  . valACYclovir (VALTREX) 500 MG tablet For 5 -10 days, as needed for outbreaks. 30 tablet 12   No current facility-administered medications for this visit.      ALLERGIES: Haldol [haloperidol] and Nevirapine  Family History  Problem Relation Age of Onset  . Diabetes Father     Social History   Social History  . Marital status: Divorced    Spouse name: N/A  . Number of children: N/A  . Years of education:  N/A   Occupational History  . Not on file.   Social History Main Topics  . Smoking status: Former Smoker    Types: Cigarettes    Quit date: 03/01/1990  . Smokeless tobacco: Never Used  . Alcohol use No  . Drug use: No  . Sexual activity: Yes    Birth control/ protection: Post-menopausal     Comment: condoms   Other Topics Concern  . Not on file   Social History Narrative  . No narrative on file    Review of Systems  Constitutional: Negative.   HENT: Negative.   Eyes: Negative.   Respiratory: Negative.   Cardiovascular: Negative.   Gastrointestinal: Negative.   Genitourinary:       Bleeding with intercourse   Musculoskeletal: Negative.   Skin: Negative.   Neurological: Negative.   Endo/Heme/Allergies: Negative.   Psychiatric/Behavioral: Negative.      PHYSICAL EXAMINATION:    BP 92/60 (BP Location: Right Arm, Patient Position: Sitting, Cuff Size: Normal)   Pulse 88   Resp 14   Wt 161 lb (73 kg)   LMP 11/04/1997   BMI 28.07 kg/m     General appearance: alert, cooperative and appears stated age  Pelvic: External genitalia:  no lesions, atrophic, small laceration in the posterior fourchette              Urethra:  normal appearing urethra with no masses, tenderness or lesions              Bartholins and Skenes: normal                 Vagina: atrophic vaginal mucosa, areas of irritation bilateral vaginal wall              Cervix: posterior cervix with area of irritation, friable              Bimanual Exam:  Uterus:  normal size, contour, position, consistency, mobility, non-tender              Adnexa: no mass, fullness, tenderness              Chaperone was present for exam.  ASSESSMENT Atrophic vaginitis, vaginal and cervical erosions from intercourse, vulvar laceration    PLAN Will treat with vaginal estrogen Use lubrication with intercourse F/U in one month with Dr Sabra Heck Call with any concerns   An After Visit Summary was printed and given to the patient.

## 2016-11-05 NOTE — Assessment & Plan Note (Addendum)
Concerning given her untreated HIV. She needs chest x-ray today (to rule out pneumonia or PJP) and CD4 count and HIV viral load (as last CD4 from march 2017 which was 450). If viral load low needs to be treated for PJP due to the cough and dypnea and ongoing fevers. This determination will affect treatment as PJP would have longer length of treatment and different regimen. For now continue amoxicillin and adjust as needed based on imaging and labs

## 2016-11-05 NOTE — Telephone Encounter (Signed)
Patient has a question for Dr.Miller's nurse regarding a symptom she is having since having her pap smear.

## 2016-11-05 NOTE — Progress Notes (Signed)
Pre visit review using our clinic review tool, if applicable. No additional management support is needed unless otherwise documented below in the visit note. 

## 2016-11-05 NOTE — Telephone Encounter (Signed)
Noted  

## 2016-11-05 NOTE — Telephone Encounter (Signed)
Spoke with patient. Patient states that since she was seen for her aex on 10/10/2016 she has been experiencing spotting after intercourse. Denies any heavy bleeding. Spotting is only immediately after intercourse then subsides. Has slight discomfort after intercourse. Is using lubrication. Patient is concerned this is a result of having a pap smear. Offered for patient to be seen in the office for further evaluation. Patient is agreeable and is aware Dr.Miller is out of the office today. Appointment scheduled for 11/05/2016 at 2:30 pm with Dr.Jertson.   Cc: Dr.Miller  Routing to covering provider for final review. Patient agreeable to disposition. Will close encounter.

## 2016-11-05 NOTE — Telephone Encounter (Signed)
Bilateral broncho pneumonia/pneumonia, most pronounced at the left lung base with small left pleural effusion. Recommends a follow up PA and lateral chest xray in 3 to 4 weeks following a trial abx therapy. Report is in [place].

## 2016-11-05 NOTE — Progress Notes (Signed)
   Subjective:    Patient ID: Stephanie Espinoza, female    DOB: Jan 25, 1956, 61 y.o.   MRN: [id number]  HPI The patient is a 62 YO female coming in for sinus problems. Started about 1 week ago. 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 She is not taking flonase or other cold medicine now. Went to urgent care about 3-4 days ago and they gave her amoxicillin and she started taking that. She is having less nasal congestion now but more congestion in her chest. She is still running fevers and chills every day at home. She is having cough which is not typically productive but some rare green sputum. Has been feeling well before this illness started. Having some SOB as well. A lot of fatigue. She was not around sick contacts.   Review of Systems  Constitutional: Positive for activity change, appetite change, chills and fever. Negative for fatigue and unexpected weight change.  HENT: Positive for congestion and rhinorrhea. Negative for ear discharge, ear pain, postnasal drip, sinus pain, sinus pressure, sore throat and trouble swallowing.   Eyes: Negative.   Respiratory: Positive for cough and shortness of breath. Negative for chest tightness and wheezing.   Cardiovascular: Negative for chest pain, palpitations and leg swelling.  Gastrointestinal: Negative.   Musculoskeletal: Negative.   Skin: Negative.       Objective:   Physical Exam  Constitutional: She appears well-developed. She appears distressed.  Mild distress  HENT:  Head: Normocephalic and atraumatic.  Right Ear: External ear normal.  Left Ear: External ear normal.  Oropharynx with erythema and clear drainage, some plaque which is white to yellow on the tongue, nose without crusting, no sinus pressure  Eyes: EOM are normal.  Neck: Normal range of motion.  Cardiovascular: Normal rate and regular rhythm.   Pulmonary/Chest: Effort normal. No respiratory distress. She has no wheezes.  She has no rales.  Some rhonchi in the lungs, does not clear with cough  Abdominal: Soft.  Lymphadenopathy:    She has no cervical adenopathy.  Skin: Skin is warm and dry.   Vitals:   11/05/16 1136  BP: 110/70  Pulse: (!) 101  Resp: 14  Temp: 98.6 F (37 C)  TempSrc: Oral  SpO2: 99%  Weight: 160 lb (72.6 kg)  Height: 5' 3.5" (1.613 m)      Assessment & Plan:

## 2016-11-05 NOTE — Patient Instructions (Signed)
We will check the chest x-ray today and also some blood work to check the blood counts.   We will call you back with the results.   Start taking the flonase every day and also keep taking the amoxicillin.

## 2016-11-06 LAB — T-HELPER CELLS (CD4) COUNT (NOT AT ARMC)
Absolute CD4: 462 cells/uL (ref 381–1469)
CD4 T Helper %: 15 % — ABNORMAL LOW (ref 32–62)
Total lymphocyte count: 3146 cells/uL (ref 700–3300)

## 2016-11-07 LAB — HIV-1 RNA QUANT-NO REFLEX-BLD
HIV 1 RNA Quant: 5540 copies/mL — ABNORMAL HIGH (ref <20)
HIV-1 RNA Quant, Log: 3.74 Log copies/mL — ABNORMAL HIGH (ref <1.30)

## 2016-11-08 ENCOUNTER — Telehealth: Payer: Self-pay | Admitting: Internal Medicine

## 2016-11-08 NOTE — Telephone Encounter (Signed)
Pt called requesting results from labs and xray. I let her know what Dr. Sharlet Salina had said but she would still like a call back to talk about how long pneumonia lasts.  She can be reached at [phone]

## 2016-11-08 NOTE — Telephone Encounter (Signed)
Contacted patient and gave a little more info about pneumonia and how long it lasts and to call back if she isnt feeling any better a few days after she finishes her antibiotics.

## 2016-11-11 ENCOUNTER — Telehealth: Payer: Self-pay | Admitting: Internal Medicine

## 2016-11-11 NOTE — Telephone Encounter (Signed)
If she is not feeling any better she needs visit for re-evaluation today.

## 2016-11-11 NOTE — Telephone Encounter (Signed)
patient walked in today to adf ise that she is still not feeling better. she is asking if you will extend her leave from work through Wednesday. She will need a new letter.

## 2016-11-11 NOTE — Telephone Encounter (Signed)
Scheduled for tomorrow.

## 2016-11-12 ENCOUNTER — Encounter: Payer: Self-pay | Admitting: Internal Medicine

## 2016-11-12 ENCOUNTER — Ambulatory Visit: Payer: 59

## 2016-11-12 ENCOUNTER — Ambulatory Visit (INDEPENDENT_AMBULATORY_CARE_PROVIDER_SITE_OTHER): Payer: 59 | Admitting: Internal Medicine

## 2016-11-12 VITALS — BP 130/68 | HR 84 | Temp 98.1°F | Resp 12 | Ht 63.5 in | Wt 155.0 lb

## 2016-11-12 DIAGNOSIS — J189 Pneumonia, unspecified organism: Secondary | ICD-10-CM | POA: Insufficient documentation

## 2016-11-12 DIAGNOSIS — J181 Lobar pneumonia, unspecified organism: Secondary | ICD-10-CM | POA: Diagnosis not present

## 2016-11-12 MED ORDER — AMOXICILLIN 500 MG PO CAPS: 500.0000 mg | ORAL_CAPSULE | Freq: Three times a day (TID) | ORAL | 0 refills | Status: DC

## 2016-11-12 NOTE — Progress Notes (Signed)
Pre visit review using our clinic review tool, if applicable. No additional management support is needed unless otherwise documented below in the visit note. 

## 2016-11-12 NOTE — Progress Notes (Signed)
   Subjective:    Patient ID: Stephanie Espinoza, female    DOB: [birth year], 61 y.o.   MRN: [id number]  HPI The patient is a 61 YO female coming in for follow up of recent pneumonia. She is done with the amoxicillin and still has 3 days left of doxycycline. She is also HIV positive and not on HAART but her CD4 count just checked and is adequate. She is feeling a little bit better. Maybe 50%. She is still SOB and coughing a lot. This is bothering her sleep. Some nasal dripping and not much nose congestion and drainage. No fevers but some chills still. Energy level poor. Endurance is poor.   Dec 29th to Jan 15th for [facility] papers.   Review of Systems  Constitutional: Positive for activity change, appetite change, chills and fatigue. Negative for fever and unexpected weight change.  HENT: Positive for congestion and postnasal drip. Negative for ear discharge, ear pain, rhinorrhea, sinus pain, sinus pressure, sneezing, sore throat and trouble swallowing.   Eyes: Negative.   Respiratory: Positive for cough and shortness of breath. Negative for chest tightness and wheezing.   Cardiovascular: Negative.   Gastrointestinal: Negative.   Musculoskeletal: Negative.       Objective:   Physical Exam  Constitutional: She is oriented to person, place, and time. She appears well-developed and well-nourished.  HENT:  Head: Normocephalic and atraumatic.  Eyes: EOM are normal.  Neck: Normal range of motion.  Cardiovascular: Normal rate and regular rhythm.   Pulmonary/Chest: Effort normal. No respiratory distress. She has no wheezes.  Some rhonchi still in left base, overall rest of lung sounds improved from prior  Abdominal: Soft. She exhibits no distension. There is no tenderness. There is no rebound and no guarding.  Musculoskeletal: She exhibits no edema.  Neurological: She is alert and oriented to person, place, and time. Coordination normal.  Skin: Skin is warm and dry.   Vitals:   11/12/16 0946  BP:  130/68  Pulse: 84  Resp: 12  Temp: 98.1 F (36.7 C)  TempSrc: Oral  SpO2: 100%  Weight: 155 lb (70.3 kg)  Height: 5' 3.5" (1.613 m)      Assessment & Plan:

## 2016-11-12 NOTE — Patient Instructions (Signed)
We have given you a letter for work.   We have also sent in another 3 days of the amoxicillin to keep taking. Keep taking the doxycycline as well.   Come back to the x-ray department around Feb 9th for a follow up chest x-ray.

## 2016-11-12 NOTE — Assessment & Plan Note (Signed)
Will add another 3 days amoxicillin for full therapy and she will finish the doxycycline (total 10 days). Lungs sound improved today but not clear. Needs follow up chest x-ray in about 4 weeks and order placed today. With her concurrent HIV her recovery may be extended although her CD4 count is adequate to rule out opportunistic infections.

## 2016-11-13 ENCOUNTER — Telehealth: Payer: Self-pay | Admitting: Internal Medicine

## 2016-11-13 NOTE — Telephone Encounter (Signed)
States just received FMLA form.  States Part A number 4 on page 3 needs to be completed and faxed to [phone].

## 2016-11-14 ENCOUNTER — Telehealth: Payer: Self-pay | Admitting: Internal Medicine

## 2016-11-14 NOTE — Telephone Encounter (Signed)
Patient states she needs a release back to work letter in order to return to work next week.

## 2016-11-14 NOTE — Telephone Encounter (Signed)
Contacted patient and stated that she had one.  She said he had the letter and had sent it to her employer and they said they need some kind of release back to work form.  She is going to call her employer and get clarification

## 2016-11-14 NOTE — Telephone Encounter (Signed)
Faxed back this morning 11/14/2016

## 2016-11-14 NOTE — Telephone Encounter (Signed)
She was given a work note with return to work date at last visit.

## 2016-11-14 NOTE — Telephone Encounter (Signed)
Filled out and given back to you for re-fax.

## 2016-12-03 ENCOUNTER — Ambulatory Visit: Payer: 59 | Admitting: Obstetrics and Gynecology

## 2016-12-06 ENCOUNTER — Encounter: Payer: Self-pay | Admitting: Obstetrics & Gynecology

## 2016-12-06 ENCOUNTER — Ambulatory Visit (INDEPENDENT_AMBULATORY_CARE_PROVIDER_SITE_OTHER): Payer: 59 | Admitting: Obstetrics & Gynecology

## 2016-12-06 ENCOUNTER — Ambulatory Visit (INDEPENDENT_AMBULATORY_CARE_PROVIDER_SITE_OTHER)
Admission: RE | Admit: 2016-12-06 | Discharge: 2016-12-06 | Disposition: A | Discharge status: Home / Self Care | Payer: 59 | Source: Ambulatory Visit | Attending: Internal Medicine | Admitting: Internal Medicine

## 2016-12-06 VITALS — BP 104/74 | HR 76 | Resp 14 | Wt 152.0 lb

## 2016-12-06 DIAGNOSIS — J181 Lobar pneumonia, unspecified organism: Secondary | ICD-10-CM

## 2016-12-06 DIAGNOSIS — N952 Postmenopausal atrophic vaginitis: Secondary | ICD-10-CM

## 2016-12-06 DIAGNOSIS — J189 Pneumonia, unspecified organism: Secondary | ICD-10-CM

## 2016-12-06 MED ORDER — ESTRADIOL 0.1 MG/GM VA CREA: TOPICAL_CREAM | VAGINAL | 1 refills | Status: DC

## 2016-12-06 NOTE — Progress Notes (Signed)
GYNECOLOGY  VISIT   HPI: 61 y.o. [id number] Divorced [place] American female here for follow up after being seen by Dr. Nelson Chimes for pain, vaginal irritation and dryness that was consistent with vaginal atrophic changes.  Reports she is feeling better and "not as dry" but would like to have some increased vaginal moisture.  Would like to discuss other options.  Is not seeing blood or pink with wiping any longer.  Denies pain with intercourse.  Did have recent evaluation for pneumonia and is much better.  Denies any urinary symptoms or pelvic pain.  Patient's last menstrual period was 11/04/1997.    GYNECOLOGIC HISTORY: Patient's last menstrual period was 11/04/1997. Contraception: condoms and PMP status2 Menopausal hormone therapy: none  Patient Active Problem List   Diagnosis Date Noted  . Community acquired pneumonia of left lower lobe of lung ([place]) 11/12/2016  . Cough 11/05/2016  . 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 Dysuria 08/06/2016  . Low back pain 06/12/2016  . Essential hypertension 09/16/2014  . 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 OTHER NEUTROPENIA 03/22/2009  . Human immunodeficiency virus (HIV) disease ([place]) 09/06/2006  . GENITAL HERPES 09/06/2006  . SYPHILIS NOS 09/06/2006  . Depression 09/06/2006  . BELL'S PALSY 09/06/2006  . PAP SMEAR, ABNORMAL 09/06/2006    Past Medical History:  Diagnosis Date  . Abnormal Pap smear of cervix before 1995   always a repeat back to normal except for 1 time and had colpo bioosy -resutls were normal  . Allergic rhinitis   . Anxiety   . Bell's palsy after 1995 ?   no residual SE.  [name] Depression   . Genital herpes   . History of drug abuse    drug usser [place].  cocaine  . HIV (human immunodeficiency virus infection) ([place]) 1990   from sexual contact who is unknown  . Syphilis     Past Surgical History:  Procedure Laterality Date  . CESAREAN SECTION  1997    MEDS:  Reviewed in EPIC  and UTD  ALLERGIES: Haldol [haloperidol] and Nevirapine  Family History  Problem Relation Age of Onset  . Diabetes Father     SH:  Divorced, non smoker  Review of Systems  All other systems reviewed and are negative.   PHYSICAL EXAMINATION:    BP 104/74 (BP Location: Right Arm, Patient Position: Sitting, Cuff Size: Normal)   Pulse 76   Resp 14   Wt 152 lb (68.9 kg)   LMP 11/04/1997   BMI 26.50 kg/m     General appearance: alert, cooperative and appears stated age Abdomen: soft, non-tender; bowel sounds normal; no masses,  no organomegaly  Pelvic: External genitalia:  no lesions              Urethra:  normal appearing urethra with no masses, tenderness or lesions              Bartholins and Skenes: normal                 Vagina: normal appearing vagina with normal color and discharge, no blood              Cervix: no lesions              Bimanual Exam:  Uterus:  normal size, contour, position, consistency, mobility, non-tender              Adnexa: no mass, fullness, tenderness  Anus:  no lesions  Chaperone was present for exam.  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  Rx to pharmacy.  Pt may decrease this to one weekly or even every other week if she feels she has enough improvement with this frequency of dosing.  Instructions provided.  Pt comfortable with plan.

## 2017-01-13 ENCOUNTER — Telehealth: Payer: Self-pay | Admitting: Internal Medicine

## 2017-01-13 NOTE — Telephone Encounter (Signed)
Pt called request referral for evaluate for dysrexia?(learning disability). Advise pt she might need an appt first but pt request for [place] to ask before making an appt.

## 2017-01-14 NOTE — Telephone Encounter (Signed)
Patient contacted and stated awareness 

## 2017-01-14 NOTE — Telephone Encounter (Signed)
She can call the [place] [facility]: [phone] to schedule at her convenience and does not need referral from [place].

## 2017-05-02 ENCOUNTER — Encounter: Payer: Self-pay | Admitting: Nurse Practitioner

## 2017-05-02 ENCOUNTER — Ambulatory Visit (INDEPENDENT_AMBULATORY_CARE_PROVIDER_SITE_OTHER): Payer: 59 | Admitting: Nurse Practitioner

## 2017-05-02 ENCOUNTER — Other Ambulatory Visit (INDEPENDENT_AMBULATORY_CARE_PROVIDER_SITE_OTHER): Payer: 59

## 2017-05-02 VITALS — BP 108/68 | HR 75 | Temp 98.2°F | Ht 63.5 in | Wt 145.1 lb

## 2017-05-02 DIAGNOSIS — Z13220 Encounter for screening for lipoid disorders: Secondary | ICD-10-CM | POA: Diagnosis not present

## 2017-05-02 DIAGNOSIS — I10 Essential (primary) hypertension: Secondary | ICD-10-CM

## 2017-05-02 DIAGNOSIS — Z136 Encounter for screening for cardiovascular disorders: Secondary | ICD-10-CM

## 2017-05-02 DIAGNOSIS — Z0000 Encounter for general adult medical examination without abnormal findings: Secondary | ICD-10-CM | POA: Diagnosis not present

## 2017-05-02 DIAGNOSIS — R739 Hyperglycemia, unspecified: Secondary | ICD-10-CM

## 2017-05-02 LAB — CBC WITH DIFFERENTIAL/PLATELET
Basophils Absolute: 0 10*3/uL (ref 0.0–0.1)
Basophils Relative: 0.5 % (ref 0.0–3.0)
Eosinophils Absolute: 0.1 10*3/uL (ref 0.0–0.7)
Eosinophils Relative: 1.1 % (ref 0.0–5.0)
HCT: 34.4 % — ABNORMAL LOW (ref 36.0–46.0)
Hemoglobin: 11.5 g/dL — ABNORMAL LOW (ref 12.0–15.0)
Lymphocytes Relative: 59.9 % — ABNORMAL HIGH (ref 12.0–46.0)
Lymphs Abs: 3.1 10*3/uL (ref 0.7–4.0)
MCHC: 33.4 g/dL (ref 30.0–36.0)
MCV: 84 fl (ref 78.0–100.0)
Monocytes Absolute: 0.5 10*3/uL (ref 0.1–1.0)
Monocytes Relative: 9.4 % (ref 3.0–12.0)
Neutro Abs: 1.5 10*3/uL (ref 1.4–7.7)
Neutrophils Relative %: 29.1 % — ABNORMAL LOW (ref 43.0–77.0)
Platelets: 208 10*3/uL (ref 150.0–400.0)
RBC: 4.09 Mil/uL (ref 3.87–5.11)
RDW: 14.2 % (ref 11.5–15.5)
WBC: 5.2 10*3/uL (ref 4.0–10.5)

## 2017-05-02 LAB — COMPREHENSIVE METABOLIC PANEL
ALT: 19 U/L (ref 0–35)
AST: 25 U/L (ref 0–37)
Albumin: 3.7 g/dL (ref 3.5–5.2)
Alkaline Phosphatase: 83 U/L (ref 39–117)
BUN: 14 mg/dL (ref 6–23)
CO2: 28 mEq/L (ref 19–32)
Calcium: 9.7 mg/dL (ref 8.4–10.5)
Chloride: 102 mEq/L (ref 96–112)
Creatinine, Ser: 0.97 mg/dL (ref 0.40–1.20)
GFR: 75.69 mL/min (ref >60.00)
Glucose, Bld: 83 mg/dL (ref 70–99)
Potassium: 3.3 mEq/L — ABNORMAL LOW (ref 3.5–5.1)
Sodium: 138 mEq/L (ref 135–145)
Total Bilirubin: 0.8 mg/dL (ref 0.2–1.2)
Total Protein: 9.5 g/dL — ABNORMAL HIGH (ref 6.0–8.3)

## 2017-05-02 LAB — TSH: TSH: 0.92 u[IU]/mL (ref 0.35–4.50)

## 2017-05-02 LAB — LIPID PANEL
Cholesterol: 169 mg/dL (ref 0–200)
HDL: 33.4 mg/dL — ABNORMAL LOW (ref >39.00)
LDL Cholesterol: 100 mg/dL — ABNORMAL HIGH (ref 0–99)
NonHDL: 135.91
Total CHOL/HDL Ratio: 5
Triglycerides: 179 mg/dL — ABNORMAL HIGH (ref 0.0–149.0)
VLDL: 35.8 mg/dL (ref 0.0–40.0)

## 2017-05-02 LAB — HEMOGLOBIN A1C: Hgb A1c MFr Bld: 5.6 % (ref 4.6–6.5)

## 2017-05-02 MED ORDER — POTASSIUM CHLORIDE ER 10 MEQ PO TBCR: 10.0000 meq | EXTENDED_RELEASE_TABLET | Freq: Every day | ORAL | 3 refills | Status: DC

## 2017-05-02 MED ORDER — HYDROCHLOROTHIAZIDE 25 MG PO TABS: 25.0000 mg | ORAL_TABLET | Freq: Every day | ORAL | 3 refills | Status: DC

## 2017-05-02 NOTE — Progress Notes (Signed)
Subjective:    Patient ID: Stephanie Espinoza, female    DOB: 1956-10-14, 61 y.o.   MRN: [id number]  Patient presents today for complete physical  HPI HIV Infection:  Dr. Megan Salon: Infectious Disease. OV once a year. Does not take any antiviral at this time due to adverse side effects.  Denies any acute complains  Immunizations: (TDAP, Hep C screen, Pneumovax, Influenza, zoster)  Health Maintenance  Topic Date Due  . Mammogram  09/10/2008  . Flu Shot  06/04/2017  . Cologuard (Stool DNA test)  09/24/2017  . Pap Smear  10/11/2019  . Tetanus Vaccine  10/10/2026  .  Hepatitis C: One time screening is recommended by Center for Disease Control  (CDC) for  adults born from [birth year] through 1965.   Completed  . HIV Screening  Completed   Diet:heart healthy.  Weight:  Wt Readings from Last 3 Encounters:  05/02/17 145 lb 1.9 oz (65.8 kg)  12/06/16 152 lb (68.9 kg)  11/12/16 155 lb (70.3 kg)   Exercise:walking, 57mins daily  Fall Risk: Fall Risk  05/02/2017 01/25/2016 01/10/2015 04/04/2014 09/20/2013 11/23/2012  Falls in the past year? No No No No No No   Home Safety: home alone, has college daughter.  Depression/Suicide: Depression screen [facility] 2/9 05/02/2017 01/25/2016 01/10/2015 04/04/2014 09/20/2013 11/23/2012 11/12/2011  Decreased Interest 0 0 0 0 0 0 0  Down, Depressed, Hopeless 0 0 0 0 0 0 0  PHQ - 2 Score 0 0 0 0 0 0 0   No flowsheet data found.  Vision:annually, done by [facility]: Dr. Allayne Butcher  Dental:every 48months, by Lovena Neighbours.  Sexual History (birth control, marital status, STD):single, not sexually active  Medications and allergies reviewed with patient and updated if appropriate.  Patient Active Problem List   Diagnosis Date Noted  . Community acquired pneumonia of left lower lobe of lung ([place]) 11/12/2016  . Cough 11/05/2016  . 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 Dysuria 08/06/2016  . Low back pain 06/12/2016  . Essential  hypertension 09/16/2014  . 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 OTHER NEUTROPENIA 03/22/2009  . Human immunodeficiency virus (HIV) disease ([place]) 09/06/2006  . GENITAL HERPES 09/06/2006  . SYPHILIS NOS 09/06/2006  . Depression 09/06/2006  . BELL'S PALSY 09/06/2006  . PAP SMEAR, ABNORMAL 09/06/2006    Current Outpatient Prescriptions on File Prior to Visit  Medication Sig Dispense Refill  . albuterol (PROVENTIL HFA;VENTOLIN HFA) 108 (90 Base) MCG/ACT inhaler Inhale 2 puffs into the lungs every 6 (six) hours as needed for wheezing or shortness of breath. 1 Inhaler 2  . cyclobenzaprine (FLEXERIL) 5 MG tablet Take 1 tablet (5 mg total) by mouth 3 (three) times daily as needed for muscle spasms. 60 tablet 1  . hydrochlorothiazide (HYDRODIURIL) 25 MG tablet Take 1 tablet (25 mg total) by mouth daily. 90 tablet 3  . valACYclovir (VALTREX) 500 MG tablet For 5 -10 days, as needed for outbreaks. 30 tablet 12   No current facility-administered medications on file prior to visit.     Past Medical History:  Diagnosis Date  . Abnormal Pap smear of cervix before 1995   always a repeat back to normal except for 1 time and had colpo bioosy -resutls were normal  . Allergic rhinitis   . Anxiety   . Bell's palsy after 1995 ?   no residual SE.  [name] Depression   . Genital herpes   . History of drug abuse  drug usser [place].  cocaine  . HIV (human immunodeficiency virus infection) ([place]) 1990   from sexual contact who is unknown  . Syphilis     Past Surgical History:  Procedure Laterality Date  . [place]    Social History   Social History  . Marital status: Divorced    Spouse name: N/A  . Number of children: N/A  . Years of education: N/A   Social History Main Topics  . Smoking status: Former Smoker    Types: Cigarettes    Quit date: 03/01/1990  . Smokeless tobacco: Never Used  . Alcohol use No  . Drug use: No  . Sexual activity: Yes    Birth control/ protection:  Post-menopausal     Comment: condoms   Other Topics Concern  . Not on file   Social History Narrative  . No narrative on file    Family History  Problem Relation Age of Onset  . Diabetes Father         Review of Systems  Constitutional: Negative for fever, malaise/fatigue and weight loss.  HENT: Negative for congestion and sore throat.   Eyes:       Negative for visual changes  Respiratory: Negative for cough and shortness of breath.   Cardiovascular: Negative for chest pain, palpitations and leg swelling.  Gastrointestinal: Negative for blood in stool, constipation, diarrhea and heartburn.  Genitourinary: Negative for dysuria, frequency and urgency.  Musculoskeletal: Negative for falls, joint pain and myalgias.  Skin: Negative for rash.  Neurological: Negative for dizziness, sensory change and headaches.  Endo/Heme/Allergies: Does not bruise/bleed easily.  Psychiatric/Behavioral: Negative for depression, substance abuse and suicidal ideas. The patient is not nervous/anxious and does not have insomnia.     Objective:   Vitals:   05/02/17 1510  BP: 108/68  Pulse: 75  Temp: 98.2 F (36.8 C)    Body mass index is 25.3 kg/m.   Physical Examination:  Physical Exam  Constitutional: She is oriented to person, place, and time and well-developed, well-nourished, and in no distress. No distress.  HENT:  Right Ear: External ear normal.  Left Ear: External ear normal.  Nose: Nose normal.  Mouth/Throat: Oropharynx is clear and moist. No oropharyngeal exudate.  Eyes: Conjunctivae and EOM are normal. Pupils are equal, round, and reactive to light. No scleral icterus.  Neck: Normal range of motion. Neck supple. No thyromegaly present.  Cardiovascular: Normal rate, normal heart sounds and intact distal pulses.   Pulmonary/Chest: Effort normal and breath sounds normal. She exhibits no tenderness.  Abdominal: Soft. Bowel sounds are normal. She exhibits no distension. There is  no tenderness.  Musculoskeletal: Normal range of motion. She exhibits no edema or tenderness.  Lymphadenopathy:    She has no cervical adenopathy.  Neurological: She is alert and oriented to person, place, and time. Gait normal.  Skin: Skin is warm and dry.  Psychiatric: Affect and judgment normal.    ASSESSMENT and PLAN:  Catalia was seen today for annual exam.  Diagnoses and all orders for this visit:  Encounter for preventative adult health care examination -     CBC w/Diff; Future -     Comprehensive metabolic panel; Future -     TSH; Future -     Lipid panel; Future  Encounter for lipid screening for cardiovascular disease -     Lipid panel; Future  Essential hypertension  Hyperglycemia -     Hemoglobin A1c; Future   No problem-specific Assessment &  Plan notes found for this encounter.     Follow up: Return if symptoms worsen or fail to improve.  Wilfred Lacy, NP

## 2017-05-02 NOTE — Patient Instructions (Signed)
Go to basement for blood draw. Will be called with lab result.  Health Maintenance, Female Adopting a healthy lifestyle and getting preventive care can go a long way to promote health and wellness. Talk with your health care provider about what schedule of regular examinations is right for you. This is a good chance for you to check in with your provider about disease prevention and staying healthy. In between checkups, there are plenty of things you can do on your own. Experts have done a lot of research about which lifestyle changes and preventive measures are most likely to keep you healthy. Ask your health care provider for more information. Weight and diet Eat a healthy diet  Be sure to include plenty of vegetables, fruits, low-fat dairy products, and lean protein.  Do not eat a lot of foods high in solid fats, added sugars, or salt.  Get regular exercise. This is one of the most important things you can do for your health. ? Most adults should exercise for at least 150 minutes each week. The exercise should increase your heart rate and make you sweat (moderate-intensity exercise). ? Most adults should also do strengthening exercises at least twice a week. This is in addition to the moderate-intensity exercise.  Maintain a healthy weight  Body mass index (BMI) is a measurement that can be used to identify possible weight problems. It estimates body fat based on height and weight. Your health care provider can help determine your BMI and help you achieve or maintain a healthy weight.  For females 76 years of age and older: ? A BMI below 18.5 is considered underweight. ? A BMI of 18.5 to 24.9 is normal. ? A BMI of 25 to 29.9 is considered overweight. ? A BMI of 30 and above is considered obese.  Watch levels of cholesterol and blood lipids  You should start having your blood tested for lipids and cholesterol at 59 years of age, then have this test every 5 years.  You may need to  have your cholesterol levels checked more often if: ? Your lipid or cholesterol levels are high. ? You are older than 59 years of age. ? You are at high risk for heart disease.  Cancer screening Lung Cancer  Lung cancer screening is recommended for adults 58-62 years old who are at high risk for lung cancer because of a history of smoking.  A yearly low-dose CT scan of the lungs is recommended for people who: ? Currently smoke. ? Have quit within the past 15 years. ? Have at least a 30-pack-year history of smoking. A pack year is smoking an average of one pack of cigarettes a day for 1 year.  Yearly screening should continue until it has been 15 years since you quit.  Yearly screening should stop if you develop a health problem that would prevent you from having lung cancer treatment.  Breast Cancer  Practice breast self-awareness. This means understanding how your breasts normally appear and feel.  It also means doing regular breast self-exams. Let your health care provider know about any changes, no matter how small.  If you are in your 20s or 30s, you should have a clinical breast exam (CBE) by a health care provider every 1-3 years as part of a regular health exam.  If you are 20 or older, have a CBE every year. Also consider having a breast X-ray (mammogram) every year.  If you have a family history of breast cancer, talk to your  health care provider about genetic screening.  If you are at high risk for breast cancer, talk to your health care provider about having an MRI and a mammogram every year.  Breast cancer gene (BRCA) assessment is recommended for women who have family members with BRCA-related cancers. BRCA-related cancers include: ? Breast. ? Ovarian. ? Tubal. ? Peritoneal cancers.  Results of the assessment will determine the need for genetic counseling and BRCA1 and BRCA2 testing.  Cervical Cancer Your health care provider may recommend that you be screened  regularly for cancer of the pelvic organs (ovaries, uterus, and vagina). This screening involves a pelvic examination, including checking for microscopic changes to the surface of your cervix (Pap test). You may be encouraged to have this screening done every 3 years, beginning at age 70.  For women ages 66-65, health care providers may recommend pelvic exams and Pap testing every 3 years, or they may recommend the Pap and pelvic exam, combined with testing for human papilloma virus (HPV), every 5 years. Some types of HPV increase your risk of cervical cancer. Testing for HPV may also be done on women of any age with unclear Pap test results.  Other health care providers may not recommend any screening for nonpregnant women who are considered low risk for pelvic cancer and who do not have symptoms. Ask your health care provider if a screening pelvic exam is right for you.  If you have had past treatment for cervical cancer or a condition that could lead to cancer, you need Pap tests and screening for cancer for at least 20 years after your treatment. If Pap tests have been discontinued, your risk factors (such as having a new sexual partner) need to be reassessed to determine if screening should resume. Some women have medical problems that increase the chance of getting cervical cancer. In these cases, your health care provider may recommend more frequent screening and Pap tests.  Colorectal Cancer  This type of cancer can be detected and often prevented.  Routine colorectal cancer screening usually begins at 59 years of age and continues through 59 years of age.  Your health care provider may recommend screening at an earlier age if you have risk factors for colon cancer.  Your health care provider may also recommend using home test kits to check for hidden blood in the stool.  A small camera at the end of a tube can be used to examine your colon directly (sigmoidoscopy or colonoscopy). This is  done to check for the earliest forms of colorectal cancer.  Routine screening usually begins at age 4.  Direct examination of the colon should be repeated every 5-10 years through 59 years of age. However, you may need to be screened more often if early forms of precancerous polyps or small growths are found.  Skin Cancer  Check your skin from head to toe regularly.  Tell your health care provider about any new moles or changes in moles, especially if there is a change in a mole's shape or color.  Also tell your health care provider if you have a mole that is larger than the size of a pencil eraser.  Always use sunscreen. Apply sunscreen liberally and repeatedly throughout the day.  Protect yourself by wearing long sleeves, pants, a wide-brimmed hat, and sunglasses whenever you are outside.  Heart disease, diabetes, and high blood pressure  High blood pressure causes heart disease and increases the risk of stroke. High blood pressure is more likely to  develop in: ? People who have blood pressure in the high end of the normal range (130-139/85-89 mm Hg). ? People who are overweight or obese. ? People who are African American.  If you are 93-29 years of age, have your blood pressure checked every 3-5 years. If you are 29 years of age or older, have your blood pressure checked every year. You should have your blood pressure measured twice-once when you are at a hospital or clinic, and once when you are not at a hospital or clinic. Record the average of the two measurements. To check your blood pressure when you are not at a hospital or clinic, you can use: ? An automated blood pressure machine at a pharmacy. ? A home blood pressure monitor.  If you are between 75 years and 4 years old, ask your health care provider if you should take aspirin to prevent strokes.  Have regular diabetes screenings. This involves taking a blood sample to check your fasting blood sugar level. ? If you are  at a normal weight and have a low risk for diabetes, have this test once every three years after 59 years of age. ? If you are overweight and have a high risk for diabetes, consider being tested at a younger age or more often. Preventing infection Hepatitis B  If you have a higher risk for hepatitis B, you should be screened for this virus. You are considered at high risk for hepatitis B if: ? You were born in a country where hepatitis B is common. Ask your health care provider which countries are considered high risk. ? Your parents were born in a high-risk country, and you have not been immunized against hepatitis B (hepatitis B vaccine). ? You have HIV or AIDS. ? You use needles to inject street drugs. ? You live with someone who has hepatitis B. ? You have had sex with someone who has hepatitis B. ? You get hemodialysis treatment. ? You take certain medicines for conditions, including cancer, organ transplantation, and autoimmune conditions.  Hepatitis C  Blood testing is recommended for: ? Everyone born from [birth year] through 1963. ? Anyone with known risk factors for hepatitis C.  Sexually transmitted infections (STIs)  You should be screened for sexually transmitted infections (STIs) including gonorrhea and chlamydia if: ? You are sexually active and are younger than 59 years of age. ? You are older than 59 years of age and your health care provider tells you that you are at risk for this type of infection. ? Your sexual activity has changed since you were last screened and you are at an increased risk for chlamydia or gonorrhea. Ask your health care provider if you are at risk.  If you do not have HIV, but are at risk, it may be recommended that you take a prescription medicine daily to prevent HIV infection. This is called pre-exposure prophylaxis (PrEP). You are considered at risk if: ? You are sexually active and do not regularly use condoms or know the HIV status of your  partner(s). ? You take drugs by injection. ? You are sexually active with a partner who has HIV.  Talk with your health care provider about whether you are at high risk of being infected with HIV. If you choose to begin PrEP, you should first be tested for HIV. You should then be tested every 3 months for as long as you are taking PrEP. Pregnancy  If you are premenopausal and you may become pregnant,  ask your health care provider about preconception counseling.  If you may become pregnant, take 400 to 800 micrograms (mcg) of folic acid every day.  If you want to prevent pregnancy, talk to your health care provider about birth control (contraception). Osteoporosis and menopause  Osteoporosis is a disease in which the bones lose minerals and strength with aging. This can result in serious bone fractures. Your risk for osteoporosis can be identified using a bone density scan.  If you are 42 years of age or older, or if you are at risk for osteoporosis and fractures, ask your health care provider if you should be screened.  Ask your health care provider whether you should take a calcium or vitamin D supplement to lower your risk for osteoporosis.  Menopause may have certain physical symptoms and risks.  Hormone replacement therapy may reduce some of these symptoms and risks. Talk to your health care provider about whether hormone replacement therapy is right for you. Follow these instructions at home:  Schedule regular health, dental, and eye exams.  Stay current with your immunizations.  Do not use any tobacco products including cigarettes, chewing tobacco, or electronic cigarettes.  If you are pregnant, do not drink alcohol.  If you are breastfeeding, limit how much and how often you drink alcohol.  Limit alcohol intake to no more than 1 drink per day for nonpregnant women. One drink equals 12 ounces of beer, 5 ounces of wine, or 1 ounces of hard liquor.  Do not use street  drugs.  Do not share needles.  Ask your health care provider for help if you need support or information about quitting drugs.  Tell your health care provider if you often feel depressed.  Tell your health care provider if you have ever been abused or do not feel safe at home. This information is not intended to replace advice given to you by your health care provider. Make sure you discuss any questions you have with your health care provider. Document Released: 05/06/2011 Document Revised: 03/28/2016 Document Reviewed: 07/25/2015 Elsevier Interactive Patient Education  [employer].

## 2017-05-02 NOTE — Progress Notes (Signed)
Pre visit review using our clinic review tool, if applicable. No additional management support is needed unless otherwise documented below in the visit note. 

## 2017-05-04 ENCOUNTER — Encounter: Payer: Self-pay | Admitting: Nurse Practitioner

## 2017-05-05 ENCOUNTER — Telehealth: Payer: Self-pay | Admitting: Internal Medicine

## 2017-05-05 NOTE — Telephone Encounter (Signed)
Pt called in and has some questions about the meds that [facility] prescribe for her

## 2017-05-05 NOTE — Telephone Encounter (Signed)
Advised patient why she was given potassium supp---her HCTZ is for blood pressure, but it causes potassium depletion for some patients, potassium rx was sent to help with slight decrease in potassium level

## 2017-05-10 ENCOUNTER — Encounter: Payer: Self-pay | Admitting: Family Medicine

## 2017-05-10 ENCOUNTER — Ambulatory Visit (INDEPENDENT_AMBULATORY_CARE_PROVIDER_SITE_OTHER): Payer: 59 | Admitting: Family Medicine

## 2017-05-10 DIAGNOSIS — R200 Anesthesia of skin: Secondary | ICD-10-CM | POA: Insufficient documentation

## 2017-05-10 DIAGNOSIS — R202 Paresthesia of skin: Secondary | ICD-10-CM | POA: Diagnosis not present

## 2017-05-10 MED ORDER — GABAPENTIN 100 MG PO CAPS: 100.0000 mg | ORAL_CAPSULE | Freq: Three times a day (TID) | ORAL | 0 refills | Status: DC | PRN

## 2017-05-10 NOTE — Progress Notes (Signed)
Woke up Wed AM and had cramping in the R leg, not the L leg.  It didn't get better as the day went on.  She rested that day.  Went to work on Thursday and it was still tingling and numb.  Pain is variable, can be severe, dull, waxes and wanes.  No arm sx.  No FCNAVD other than slight runny nose last week. She doesn't feel ill o/w. She can have trouble feeling the floor when walking.  No falls. No back pain currently but prev with some lower back pain.  She tried aleve with dulling of the pain but it didn't affect the tingling.    Prev xrays L spine w/o acute changes.    Meds, vitals, and allergies reviewed.   ROS: Per HPI unless specifically indicated in ROS section   nad ncat rrr ctab abd soft, not ttp Back not ttp Able to bear weight on each leg independently but some weakness on R leg vs L for hip flexion and 1st toe dorsiflexion.  O/w strength intact and symmetric.  DTRs wnl B, equal.  Altered sensation on R leg diffusely, but able to feel some light touch.

## 2017-05-10 NOTE — Assessment & Plan Note (Signed)
D/w pt about options.  I don't have imaging ability at this point.  Her situation isn't severe enough to warrant ER eval at this point.  She agrees.  Aleve with food, start gabapentin for tingling- sedation caution.  If severe symptoms, then go to ER, esp if progressive weakness.  She can still stand on either leg independently and can do single leg calf raise on the R leg.   Pt to call PCP with update Monday to consider repeat imaging.  She agrees with plan.

## 2017-05-10 NOTE — Patient Instructions (Signed)
Aleve with food, start gabapentin for tingling- sedation caution.  If severe symptoms, then go to ER.  Call PCP with update Monday to consider repeat imaging.  Take care.  Glad to see you.

## 2017-05-13 ENCOUNTER — Telehealth: Payer: Self-pay | Admitting: Internal Medicine

## 2017-05-13 NOTE — Telephone Encounter (Signed)
[facility] a tingling feeling in her leg. She said that Dr Damita Dunnings said that if it did not get better with the medication that she may need an MRI. She said that she has noticed a small change while on the medication but the tingling get much worse when she is trying to walk. Almost to the point that her leg feels like it is going numb. She wanted to see what she should do. She also mentioned that she has noticed a lump on the left side of her face at her jaw line that is around the size of a golf ball. It is not protruding to where it very noticeable but she can feel it when she touches it.

## 2017-05-14 NOTE — Telephone Encounter (Signed)
She needs a follow-up office visit

## 2017-05-14 NOTE — Telephone Encounter (Signed)
Can we schedule patient please

## 2017-05-14 NOTE — Telephone Encounter (Signed)
Please advise, can we order MRI and would she need visit for the lump on jaw line

## 2017-05-15 NOTE — Telephone Encounter (Signed)
LM letting pt know that she can either contact Dr Josefine Class office to order it or make an appointment with [place] to re-evaluate the issue.

## 2017-05-16 ENCOUNTER — Ambulatory Visit (INDEPENDENT_AMBULATORY_CARE_PROVIDER_SITE_OTHER): Payer: 59 | Admitting: Family Medicine

## 2017-05-16 ENCOUNTER — Encounter: Payer: Self-pay | Admitting: General Practice

## 2017-05-16 ENCOUNTER — Encounter: Payer: Self-pay | Admitting: Family Medicine

## 2017-05-16 VITALS — BP 112/82 | HR 83 | Temp 98.6°F | Resp 16 | Ht 64.0 in | Wt 147.4 lb

## 2017-05-16 DIAGNOSIS — R590 Localized enlarged lymph nodes: Secondary | ICD-10-CM | POA: Diagnosis not present

## 2017-05-16 DIAGNOSIS — R200 Anesthesia of skin: Secondary | ICD-10-CM

## 2017-05-16 MED ORDER — CYCLOBENZAPRINE HCL 10 MG PO TABS: 10.0000 mg | ORAL_TABLET | Freq: Three times a day (TID) | ORAL | 0 refills | Status: DC | PRN

## 2017-05-16 MED ORDER — PREDNISONE 10 MG PO TABS: ORAL_TABLET | ORAL | 0 refills | Status: DC

## 2017-05-16 NOTE — Progress Notes (Signed)
Pre visit review using our clinic review tool, if applicable. No additional management support is needed unless otherwise documented below in the visit note. 

## 2017-05-16 NOTE — Patient Instructions (Signed)
Follow up as needed Start the Prednisone as directed- take w/ food Use the cyclobenzaprine as needed for spasms- may cause drowsiness (best for nights/weekends) We'll call you with your Ultrasound appt for the enlarged lymph nodes If no improvement, please call and let us know! Hang in there!!

## 2017-05-16 NOTE — Progress Notes (Signed)
   Subjective:    Patient ID: Stephanie Espinoza, female    DOB: June 24, 1956, 61 y.o.   MRN: [id number]  HPI R leg numbness- pt woke on 7/4 w/ numbness of R leg.  sxs are not bothersome w/ sitting but will worsen with standing or walking.  [facility] and started on Gabapentin.  Lump on Jaw- L sided, noted 3 days ago.  TTP.  No fevers.  Pt had recent URI.  Has similar but smaller lump on R side   Review of Systems For ROS see HPI     Objective:   Physical Exam  Constitutional: She is oriented to person, place, and time. She appears well-developed and well-nourished. No distress.  HENT:  Head: Normocephalic and atraumatic.  Nose: Nose normal.  Mouth/Throat: Oropharynx is clear and moist. No oropharyngeal exudate.  TMs WNL bilaterally  Lymphadenopathy:       Head (right side): Preauricular adenopathy present. No submental, no submandibular, no tonsillar, no posterior auricular and no occipital adenopathy present.       Head (left side): Preauricular adenopathy present. No submental, no submandibular, no tonsillar, no posterior auricular and no occipital adenopathy present.    She has cervical adenopathy.       Right cervical: No superficial cervical, no deep cervical and no posterior cervical adenopathy present.      Left cervical: No superficial cervical, no deep cervical and no posterior cervical adenopathy present.       Right: No supraclavicular adenopathy present.       Left: No supraclavicular adenopathy present.  Neurological: She is alert and oriented to person, place, and time. She has normal reflexes. No cranial nerve deficit. Coordination normal.  + SLR on R, (-) on L  Skin: Skin is warm and dry.  Psychiatric: She has a normal mood and affect. Her behavior is normal. Thought content normal.  Vitals reviewed.         Assessment & Plan:  R leg numbness- new to provider.  Asymptomatic when sitting or lying but develops sxs w/ standing or walking.  Has  sensation that leg will give out.  She is not able to take the gabapentin that she [facility] b/c of the sleepiness it causes.  Start pred taper.  Flexeril prn.  If no improvement will need referral to specialist.  Pt expressed understanding and is in agreement w/ plan.   Lymphadenopathy- new.  No obvious infxn on PE.  Pt reports URI recently.  L preauricular node is larger than R but both are notably enlarged.  Check CBC.  Get [place].  Will forward results to ID.  Pt expressed understanding and is in agreement w/ plan.

## 2017-05-18 ENCOUNTER — Encounter: Payer: Self-pay | Admitting: Family Medicine

## 2017-05-22 ENCOUNTER — Other Ambulatory Visit: Payer: Self-pay | Admitting: Internal Medicine

## 2017-05-22 DIAGNOSIS — B20 Human immunodeficiency virus [HIV] disease: Secondary | ICD-10-CM

## 2017-05-23 ENCOUNTER — Other Ambulatory Visit: Payer: 59

## 2017-05-23 DIAGNOSIS — B20 Human immunodeficiency virus [HIV] disease: Secondary | ICD-10-CM

## 2017-05-26 ENCOUNTER — Other Ambulatory Visit: Payer: 59

## 2017-05-26 ENCOUNTER — Ambulatory Visit (INDEPENDENT_AMBULATORY_CARE_PROVIDER_SITE_OTHER): Payer: 59 | Admitting: Internal Medicine

## 2017-05-26 DIAGNOSIS — M5431 Sciatica, right side: Secondary | ICD-10-CM

## 2017-05-26 DIAGNOSIS — B20 Human immunodeficiency virus [HIV] disease: Secondary | ICD-10-CM

## 2017-05-26 DIAGNOSIS — M5430 Sciatica, unspecified side: Secondary | ICD-10-CM | POA: Insufficient documentation

## 2017-05-26 LAB — T-HELPER CELL (CD4) - (RCID CLINIC ONLY)
CD4 % Helper T Cell: 15 % — ABNORMAL LOW (ref 33–55)
CD4 T Cell Abs: 560 /uL (ref 400–2700)

## 2017-05-26 NOTE — Progress Notes (Signed)
Patient Active Problem List   Diagnosis Date Noted  . Human immunodeficiency virus (HIV) disease ([place]) 09/06/2006    Priority: High  . GENITAL HERPES 09/06/2006    Priority: Medium  . Sciatica 05/26/2017  . 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 Low back pain 06/12/2016  . Essential hypertension 09/16/2014  . 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 SYPHILIS NOS 09/06/2006  . Depression 09/06/2006  . BELL'S PALSY 09/06/2006  . PAP SMEAR, ABNORMAL 09/06/2006    Patient's Medications  New Prescriptions   No medications on file  Previous Medications   ALBUTEROL (PROVENTIL HFA;VENTOLIN HFA) 108 (90 BASE) MCG/ACT INHALER    Inhale 2 puffs into the lungs every 6 (six) hours as needed for wheezing or shortness of breath.   CYCLOBENZAPRINE (FLEXERIL) 10 MG TABLET    Take 1 tablet (10 mg total) by mouth 3 (three) times daily as needed for muscle spasms.   FLUTICASONE (FLONASE) 50 MCG/ACT NASAL SPRAY       HYDROCHLOROTHIAZIDE (HYDRODIURIL) 25 MG TABLET    Take 1 tablet (25 mg total) by mouth daily.   POTASSIUM CHLORIDE (K-DUR) 10 MEQ TABLET    Take 1 tablet (10 mEq total) by mouth daily.   VALACYCLOVIR (VALTREX) 500 MG TABLET    For 5 -10 days, as needed for outbreaks.  Modified Medications   No medications on file  Discontinued Medications   GABAPENTIN (NEURONTIN) 100 MG CAPSULE    Take 1-2 capsules (100-200 mg total) by mouth 3 (three) times daily as needed (sedation caution).   PREDNISONE (DELTASONE) 10 MG TABLET    3 tabs x3 days and then 2 tabs x3 days and then 1 tab x3 days.  Take w/ food.    Subjective: Stephanie Espinoza is in for her routine HIV follow-up visit. She is not on any antiretroviral medications. On July 4 she woke up and essentially she climbed out of bed she began to notice some numbness and tingling in her right foot and pain shooting down the back of her right thigh. The pain comes and goes. It is worse when she is standing and  walking. She feels like the pain and numbness have been about the same over the past 2 weeks. She does not recall any injury prior to onset of her symptoms. She had some problems with low back pain 1 year ago. Lumbar x-rays at that time showed no significant degenerative discs disease or spondylosis.  She was seen by Stephanie Espinoza on 05/10/2017. He started her on Naprosyn and gabapentin. She has taken Naprosyn about once daily and feels like that does help dull the pain. She took the gabapentin for 2 days but found that it was too sedating so she stopped it. She followed up with Stephanie Espinoza on 05/16/2017 for these symptoms and because she was concerned that she might have had a recent upper respiratory infection. She was not sure if her congestion was due to infection or her allergies. Dr. Birdie Espinoza noted that she had bilateral preauricular adenopathy. She started her on a prednisone taper for her pain. Stephanie Espinoza felt "hyped up" while on prednisone and tells me that it caused her to develop thrush and an outbreak of her genital herpes. She did not note any improvement in her numbness or pain.  Review of Systems: Review of Systems  Constitutional: Negative for chills, diaphoresis, fever, malaise/fatigue and weight loss.  HENT: Positive for congestion. Negative for  sore throat.   Respiratory: Negative for cough, sputum production and shortness of breath.   Cardiovascular: Negative for chest pain.  Gastrointestinal: Positive for constipation. Negative for abdominal pain, diarrhea, heartburn, nausea and vomiting.  Genitourinary: Negative for dysuria and frequency.  Musculoskeletal: Negative for joint pain and myalgias.       As noted in history of present illness.  Skin: Negative for rash.  Neurological: Positive for sensory change. Negative for dizziness, focal weakness and headaches.  Psychiatric/Behavioral: Negative for depression and substance abuse. The patient is not nervous/anxious.     Past  Medical History:  Diagnosis Date  . Abnormal Pap smear of cervix before 1995   always a repeat back to normal except for 1 time and had colpo bioosy -resutls were normal  . Allergic rhinitis   . Anxiety   . Bell's palsy after 1995 ?   no residual SE.  [name] Depression   . Genital herpes   . History of drug abuse    drug usser [place].  cocaine  . HIV (human immunodeficiency virus infection) ([place]) 1990   from sexual contact who is unknown  . Syphilis     Social History  Substance Use Topics  . Smoking status: Former Smoker    Types: Cigarettes    Quit date: 03/01/1990  . Smokeless tobacco: Never Used  . Alcohol use No    Family History  Problem Relation Age of Onset  . Diabetes Father     Allergies  Allergen Reactions  . Haldol [Haloperidol] Other (See Comments)    Wipes out her memory  . Nevirapine     REACTION: Rash 10/08    Objective:  Vitals:   05/26/17 1559  BP: (!) 138/100  Pulse: 87  Temp: 98 F (36.7 C)  TempSrc: Oral  SpO2: 100%  Weight: 153 lb (69.4 kg)   Body mass index is 26.26 kg/m.  Physical Exam  Constitutional: She is oriented to person, place, and time.  She is in good spirits.  HENT:  Mouth/Throat: No oropharyngeal exudate.  She has no thrush. She has small, firm preauricular lymph nodes bilaterally. They are nontender. She tells me they are getting smaller over the past week.  Eyes: Conjunctivae are normal.  Cardiovascular: Normal rate and regular rhythm.   No murmur heard. Pulmonary/Chest: Breath sounds normal.  Abdominal: Soft. She exhibits no mass. There is no tenderness.  Musculoskeletal: Normal range of motion. She exhibits no edema or tenderness.  She has good pulses in her right foot. She has no weakness in either leg.  Neurological: She is alert and oriented to person, place, and time. Gait normal.  Skin: Skin is warm and dry. No rash noted.  Psychiatric: Mood and affect normal.    Lab Results Lab Results  Component  Value Date   WBC 5.2 05/02/2017   HGB 11.5 (L) 05/02/2017   HCT 34.4 (L) 05/02/2017   MCV 84.0 05/02/2017   PLT 208.0 05/02/2017    Lab Results  Component Value Date   CREATININE 0.97 05/02/2017   BUN 14 05/02/2017   NA 138 05/02/2017   K 3.3 (L) 05/02/2017   CL 102 05/02/2017   CO2 28 05/02/2017    Lab Results  Component Value Date   ALT 19 05/02/2017   AST 25 05/02/2017   ALKPHOS 83 05/02/2017   BILITOT 0.8 05/02/2017    Lab Results  Component Value Date   CHOL 169 05/02/2017   HDL 33.40 (L) 05/02/2017  LDLCALC 100 (H) 05/02/2017   LDLDIRECT 102.1 03/02/2009   TRIG 179.0 (H) 05/02/2017   CHOLHDL 5 05/02/2017   Lab Results  Component Value Date   LABRPR NON REAC 10/10/2016   HIV 1 RNA Quant (copies/mL)  Date Value  11/05/2016 5,540 (H)  01/11/2016 159 (H)  12/21/2014 284 (H)   CD4 T Cell Abs (/uL)  Date Value  05/23/2017 560  01/11/2016 450  12/21/2014 500     Problem List Items Addressed This Visit      High   Human immunodeficiency virus (HIV) disease ([place])    Stephanie Espinoza falls into the category of long-term nonprogressor. She has never stayed on antiretroviral therapy for very long but has not had any significant decline in her CD4 count. Her most recent viral load is still pending. I will see her back in 4-6 weeks.        Unprioritized   Sciatica    Travia appears to have developed acute right-sided sciatica. I suggest having her continue Naprosyn as it seems to be giving her some relief without the side effects associated with gabapentin and prednisone. Suleima generally takes a "less is more" approach to her health. I suggested that she can probably start some gentle stretching exercises on her own within the next week. I will see her back in 4-6 weeks. If the pain persists, we might consider referral for physical therapy.           Michel Bickers, MD [facility] for [facility] [place] [facility] [phone] pager   [phone]  cell 05/26/2017, 4:50 PM

## 2017-05-26 NOTE — Assessment & Plan Note (Signed)
Matalie falls into the category of long-term nonprogressor. She has never stayed on antiretroviral therapy for very long but has not had any significant decline in her CD4 count. Her most recent viral load is still pending. I will see her back in 4-6 weeks.

## 2017-05-26 NOTE — Assessment & Plan Note (Signed)
Stephanie Espinoza appears to have developed acute right-sided sciatica. I suggest having her continue Naprosyn as it seems to be giving her some relief without the side effects associated with gabapentin and prednisone. Analysse generally takes a "less is more" approach to her health. I suggested that she can probably start some gentle stretching exercises on her own within the next week. I will see her back in 4-6 weeks. If the pain persists, we might consider referral for physical therapy.

## 2017-05-29 LAB — HIV-1 RNA QUANT-NO REFLEX-BLD
HIV 1 RNA Quant: 1890 copies/mL — ABNORMAL HIGH
HIV-1 RNA Quant, Log: 3.28 Log copies/mL — ABNORMAL HIGH

## 2017-06-03 ENCOUNTER — Encounter: Payer: Self-pay | Admitting: Internal Medicine

## 2017-06-03 ENCOUNTER — Encounter: Payer: Self-pay | Admitting: Family Medicine

## 2017-06-04 ENCOUNTER — Other Ambulatory Visit: Payer: Self-pay | Admitting: Internal Medicine

## 2017-06-04 DIAGNOSIS — M5410 Radiculopathy, site unspecified: Secondary | ICD-10-CM

## 2017-06-30 ENCOUNTER — Telehealth: Payer: Self-pay | Admitting: Obstetrics & Gynecology

## 2017-06-30 NOTE — Telephone Encounter (Signed)
Patient called for an appointment for itching and burning. Wanted to wait til Wednesday because of schedule. Appointment Wednesday at 4pm with Mrs. Debbi.

## 2017-06-30 NOTE — Telephone Encounter (Signed)
Spoke with patient. Reports vaginal burning, itching, redness and swelling around urethra for the last 2 days. Applied OTC "anti-itch/burning cream", no relief. Denies urinary complaints, vaginal d/c, bleeding. Recently changed laundry soap, though maybe this was cause. No longer using estradiol vaginal cream.  Requested OV for 8am or 4pm on Wed. Patient previously scheduled for 8/29 at 4pm with Melvia Heaps, CNM. Will keep appointment as scheduled. Patient is agreeable to date and time.  Routing to provider for final review. Patient is agreeable to disposition. Will close encounter.   Cc: Dr. Sabra Heck

## 2017-07-02 ENCOUNTER — Encounter: Payer: Self-pay | Admitting: Certified Nurse Midwife

## 2017-07-02 ENCOUNTER — Ambulatory Visit (INDEPENDENT_AMBULATORY_CARE_PROVIDER_SITE_OTHER): Payer: 59 | Admitting: Certified Nurse Midwife

## 2017-07-02 VITALS — BP 142/84 | HR 72 | Resp 16 | Ht 63.25 in | Wt 147.0 lb

## 2017-07-02 DIAGNOSIS — N898 Other specified noninflammatory disorders of vagina: Secondary | ICD-10-CM | POA: Diagnosis not present

## 2017-07-02 DIAGNOSIS — L298 Other pruritus: Secondary | ICD-10-CM

## 2017-07-02 NOTE — Patient Instructions (Signed)

## 2017-07-02 NOTE — Progress Notes (Signed)
61 y.o. Divorced [place] American female [id number] here with complaint of vaginal symptoms of itching, burning, and increase discharge. Describes discharge as white and no odor. Onset of symptoms 3 days ago. Denies new personal products except for soap and dryer sheets and also  has vaginal dryness. Used OTC vaginal itching cream with increase in symptoms.  No STD concerns. Urinary symptoms none . Contraception is Menopausal. No other health issues today.  ROS Peritinent to above  O:Healthy female WDWN Affect: normal, orientation x 3  Exam:skin warm and dry Abdomen:Soft non tender  Inguinal Lymph nodes: no enlargement or tenderness Pelvic exam: External genital: normal female with scaling and exudate BUS: negative Vagina: white thick non odorous discharge noted. Affirm taken Cervix: normal, non tender, no CMT Uterus: normal, non tender Adnexa:normal, non tender, no masses or fullness noted     A:Normal pelvic exam R/O vaginal infection   P:Discussed findings of normal pelvic exam and etiology. Discussed Aveeno or baking soda sitz bath for comfort. Avoid moist clothes or pads for extended period of time. If working out in gym clothes or swim suits for long periods of time change underwear or bottoms of swimsuit if possible. Coconut Oil use for skin protection prior to activity can be used to external skin for protection or dryness. Lab: Affirm will treat per results   Rv prn

## 2017-07-03 ENCOUNTER — Other Ambulatory Visit: Payer: Self-pay

## 2017-07-03 LAB — VAGINITIS/VAGINOSIS, DNA PROBE
Candida Species: NEGATIVE
Gardnerella vaginalis: POSITIVE — AB
Trichomonas vaginosis: NEGATIVE

## 2017-07-03 MED ORDER — METRONIDAZOLE 500 MG PO TABS: 500.0000 mg | ORAL_TABLET | Freq: Two times a day (BID) | ORAL | 0 refills | Status: DC

## 2017-07-03 NOTE — Telephone Encounter (Signed)
Return call to Joy. °

## 2017-07-03 NOTE — Telephone Encounter (Signed)
Pt notified of results

## 2017-07-03 NOTE — Telephone Encounter (Signed)
Patient returning call.

## 2017-07-03 NOTE — Telephone Encounter (Signed)
lmtcb

## 2017-07-09 ENCOUNTER — Encounter: Payer: Self-pay | Admitting: Certified Nurse Midwife

## 2017-07-09 ENCOUNTER — Telehealth: Payer: Self-pay | Admitting: *Deleted

## 2017-07-09 NOTE — Telephone Encounter (Signed)
See telephone encounter dated 07/09/17.

## 2017-07-09 NOTE — Telephone Encounter (Signed)
Reviewed with provider. Patient to complete flagyl, can take additional week for symptoms to resolve. Continue Aveeno sitz baths, may apply Aveeno eczema cream externally for itch relief. If symptoms still persist, OV recommended.   Spoke with patient, advised as seen above per Melvia Heaps, CNM. Patient verbalizes understanding and is agreeable.  Routing to provider for final review. Patient is agreeable to disposition. Will close encounter.

## 2017-07-09 NOTE — Telephone Encounter (Signed)
Spoke with patient. Reports vaginal burning, discomfort and no change in vaginal itching. Denies d/c or odor. Patient states she will complete flagyl for BV on 07/10/17. Has been taking aveeno sitz baths every other day with no relief. Advised patient Per Melvia Heaps, CNM previous recommendations, if no change needs f/u appointment. Patient declined OV at this time, request to know what additional testing may be completed? Advised patient would review with provider and return call with recommendations, patient verbalizes understanding.  Melvia Heaps, CNM -please review and advise?     From:Stephanie Espinoza To Stephanie Espinoza, CNM Sent 07/09/2017 7:02 AM  I have a question about VAGINITIS/VAGINOSIS, DNA PROBE resulted on 07/03/17 at 4:37 AM. Hi I'm still itching and uncomfortable what can I do to ease the problem?

## 2017-07-16 ENCOUNTER — Other Ambulatory Visit: Payer: 59

## 2017-07-16 DIAGNOSIS — B20 Human immunodeficiency virus [HIV] disease: Secondary | ICD-10-CM

## 2017-07-17 LAB — T-HELPER CELL (CD4) - (RCID CLINIC ONLY)
CD4 % Helper T Cell: 20 % — ABNORMAL LOW (ref 33–55)
CD4 T Cell Abs: 590 /uL (ref 400–2700)

## 2017-07-18 LAB — HIV-1 RNA QUANT-NO REFLEX-BLD
HIV 1 RNA Quant: 5350 copies/mL — ABNORMAL HIGH
HIV-1 RNA Quant, Log: 3.73 Log copies/mL — ABNORMAL HIGH

## 2017-07-21 ENCOUNTER — Other Ambulatory Visit: Payer: 59

## 2017-08-04 ENCOUNTER — Encounter: Payer: Self-pay | Admitting: Internal Medicine

## 2017-08-04 ENCOUNTER — Ambulatory Visit (INDEPENDENT_AMBULATORY_CARE_PROVIDER_SITE_OTHER): Payer: 59 | Admitting: Internal Medicine

## 2017-08-04 DIAGNOSIS — Z23 Encounter for immunization: Secondary | ICD-10-CM

## 2017-08-04 DIAGNOSIS — B20 Human immunodeficiency virus [HIV] disease: Secondary | ICD-10-CM | POA: Diagnosis not present

## 2017-08-04 MED ORDER — BICTEGRAVIR-EMTRICITAB-TENOFOV 50-200-25 MG PO TABS: 1.0000 | ORAL_TABLET | Freq: Every day | ORAL | 11 refills | Status: DC

## 2017-08-04 NOTE — Assessment & Plan Note (Signed)
Her CD4 remains normal and she continues to have relatively low-level viral activation off of antiretroviral therapy. I reviewed some of our newer combination, one pill a day regimens. She is eager to try Biktarvy. I will see her back after repeat blood work in 6 weeks.

## 2017-08-04 NOTE — Progress Notes (Signed)
Patient Active Problem List   Diagnosis Date Noted  . Human immunodeficiency virus (HIV) disease ([place]) 09/06/2006    Priority: High  . GENITAL HERPES 09/06/2006    Priority: Medium  . Sciatica 05/26/2017  . 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 Low back pain 06/12/2016  . Essential hypertension 09/16/2014  . 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 SYPHILIS NOS 09/06/2006  . Depression 09/06/2006  . BELL'S PALSY 09/06/2006  . PAP SMEAR, ABNORMAL 09/06/2006    Patient's Medications  New Prescriptions   BICTEGRAVIR-EMTRICITABINE-TENOFOVIR AF (BIKTARVY) 50-200-25 MG TABS TABLET    Take 1 tablet by mouth daily.  Previous Medications   ALBUTEROL (PROVENTIL HFA;VENTOLIN HFA) 108 (90 BASE) MCG/ACT INHALER    Inhale 2 puffs into the lungs every 6 (six) hours as needed for wheezing or shortness of breath.   FLUTICASONE (FLONASE) 50 MCG/ACT NASAL SPRAY       VALACYCLOVIR (VALTREX) 500 MG TABLET    For 5 -10 days, as needed for outbreaks.  Modified Medications   No medications on file  Discontinued Medications   CYCLOBENZAPRINE (FLEXERIL) 10 MG TABLET    Take 1 tablet (10 mg total) by mouth 3 (three) times daily as needed for muscle spasms.   METRONIDAZOLE (FLAGYL) 500 MG TABLET    Take 1 tablet (500 mg total) by mouth 2 (two) times daily.    Subjective: Stephanie Espinoza is in for her routine HIV follow-up visit.she has been undergoing physical therapy for sciatica. She is feeling better. She was treated for pneumonia earlier this year.   Review of Systems: Review of Systems  Constitutional: Negative for chills, diaphoresis, fever, malaise/fatigue and weight loss.  HENT: Negative for sore throat.   Respiratory: Negative for cough, sputum production and shortness of breath.   Cardiovascular: Negative for chest pain.  Gastrointestinal: Negative for abdominal pain, diarrhea, heartburn, nausea and vomiting.  Genitourinary: Negative for dysuria and  frequency.  Musculoskeletal: Positive for back pain. Negative for joint pain and myalgias.  Skin: Negative for rash.  Neurological: Negative for dizziness and headaches.  Psychiatric/Behavioral: Negative for depression and substance abuse. The patient is not nervous/anxious.     Past Medical History:  Diagnosis Date  . Abnormal Pap smear of cervix before 1995   always a repeat back to normal except for 1 time and had colpo bioosy -resutls were normal  . Allergic rhinitis   . Anxiety   . Bell's palsy after 1995 ?   no residual SE.  [name] Depression   . Genital herpes   . History of drug abuse    drug usser [place].  cocaine  . HIV (human immunodeficiency virus infection) ([place]) 1990   from sexual contact who is unknown  . Syphilis     Social History  Substance Use Topics  . Smoking status: Former Smoker    Types: Cigarettes    Quit date: 03/01/1990  . Smokeless tobacco: Never Used  . Alcohol use No    Family History  Problem Relation Age of Onset  . Diabetes Father     Allergies  Allergen Reactions  . Haldol [Haloperidol] Other (See Comments)    Wipes out her memory  . Nevirapine     REACTION: Rash 10/08    Objective:  Vitals:   08/04/17 1450  BP: 135/85  Pulse: 82  Temp: 98.5 F (36.9 C)  TempSrc: Oral  Weight: 146 lb (66.2 kg)   Body  mass index is 25.66 kg/m.  Physical Exam  Constitutional: She is oriented to person, place, and time.  HENT:  Mouth/Throat: No oropharyngeal exudate.  Eyes: Conjunctivae are normal.  Cardiovascular: Normal rate and regular rhythm.   No murmur heard. Pulmonary/Chest: Effort normal and breath sounds normal.  Abdominal: Soft. She exhibits no mass. There is no tenderness.  Musculoskeletal: Normal range of motion.  Neurological: She is alert and oriented to person, place, and time.  Skin: No rash noted.  Psychiatric: Mood and affect normal.    Lab Results Lab Results  Component Value Date   WBC 5.2 05/02/2017   HGB  11.5 (L) 05/02/2017   HCT 34.4 (L) 05/02/2017   MCV 84.0 05/02/2017   PLT 208.0 05/02/2017    Lab Results  Component Value Date   CREATININE 0.97 05/02/2017   BUN 14 05/02/2017   NA 138 05/02/2017   K 3.3 (L) 05/02/2017   CL 102 05/02/2017   CO2 28 05/02/2017    Lab Results  Component Value Date   ALT 19 05/02/2017   AST 25 05/02/2017   ALKPHOS 83 05/02/2017   BILITOT 0.8 05/02/2017    Lab Results  Component Value Date   CHOL 169 05/02/2017   HDL 33.40 (L) 05/02/2017   LDLCALC 100 (H) 05/02/2017   LDLDIRECT 102.1 03/02/2009   TRIG 179.0 (H) 05/02/2017   CHOLHDL 5 05/02/2017   Lab Results  Component Value Date   LABRPR NON REAC 10/10/2016   HIV 1 RNA Quant (copies/mL)  Date Value  07/16/2017 5,350 (H)  05/23/2017 1,890 (H)  11/05/2016 5,540 (H)   CD4 T Cell Abs (/uL)  Date Value  07/16/2017 590  05/23/2017 560  01/11/2016 450     Problem List Items Addressed This Visit      High   Human immunodeficiency virus (HIV) disease ([place])    Her CD4 remains normal and she continues to have relatively low-level viral activation off of antiretroviral therapy. I reviewed some of our newer combination, one pill a day regimens. She is eager to try Biktarvy. I will see her back after repeat blood work in 6 weeks.      Relevant Medications   bictegravir-emtricitabine-tenofovir AF (BIKTARVY) 50-200-25 MG TABS tablet   Other Relevant Orders   T-helper cell (CD4)- ([facility] clinic only)   HIV 1 RNA quant-no reflex-bld   CBC   Comprehensive metabolic panel        Michel Bickers, MD [facility] for [facility] [place] [phone] pager   [phone] cell 08/04/2017, 3:13 PM

## 2017-08-05 ENCOUNTER — Other Ambulatory Visit: Payer: Self-pay | Admitting: Pharmacist

## 2017-08-05 DIAGNOSIS — B20 Human immunodeficiency virus [HIV] disease: Secondary | ICD-10-CM

## 2017-08-05 MED ORDER — ELVITEG-COBIC-EMTRICIT-TENOFAF 150-150-200-10 MG PO TABS: 1.0000 | ORAL_TABLET | Freq: Every day | ORAL | 5 refills | Status: DC

## 2017-08-07 ENCOUNTER — Other Ambulatory Visit: Payer: Self-pay | Admitting: Pharmacist

## 2017-08-16 ENCOUNTER — Encounter: Payer: Self-pay | Admitting: Internal Medicine

## 2017-08-18 ENCOUNTER — Telehealth: Payer: Self-pay | Admitting: Pharmacist Clinician (PhC)/ Clinical Pharmacy Specialist

## 2017-08-18 MED ORDER — DOLUTEGRAVIR SODIUM 50 MG PO TABS
50.0000 mg | ORAL_TABLET | Freq: Every day | ORAL | 3 refills | Status: DC
Start: 2017-08-18 — End: 2018-02-06

## 2017-08-18 MED ORDER — EMTRICITABINE-TENOFOVIR AF 200-25 MG PO TABS: 1.0000 | ORAL_TABLET | Freq: Every day | ORAL | 3 refills | Status: DC

## 2017-08-18 NOTE — Telephone Encounter (Signed)
Stephanie Espinoza is complaining of severe tiredness with the [facility] and will refuse to take it. Offered her to change to DTG/Descovy. We will bring her back in 3 wks to check for tolerability.

## 2017-09-04 ENCOUNTER — Ambulatory Visit
Admission: RE | Admit: 2017-09-04 | Discharge: 2017-09-04 | Disposition: A | Discharge status: Home / Self Care | Payer: 59 | Source: Ambulatory Visit | Attending: Obstetrics & Gynecology | Admitting: Obstetrics & Gynecology

## 2017-09-04 ENCOUNTER — Ambulatory Visit: Payer: 59

## 2017-09-04 DIAGNOSIS — Z1231 Encounter for screening mammogram for malignant neoplasm of breast: Secondary | ICD-10-CM

## 2017-11-12 ENCOUNTER — Telehealth: Payer: Self-pay | Admitting: *Deleted

## 2017-11-12 NOTE — Telephone Encounter (Signed)
Patient in 08 recall for 10/2017. Patient needs AEX /PAP. Please contact patient for scheduling

## 2017-11-13 NOTE — Telephone Encounter (Signed)
Message left to return call to Kristy Schomburg at [phone].    

## 2017-11-13 NOTE — Telephone Encounter (Signed)
Please see message below and advise on recall status Thanks

## 2017-11-13 NOTE — Telephone Encounter (Signed)
Patient returned call. RN advised need to schedule aex and pap smear. Patient declines to schedule at this time stating it is a busy time for her and she will need to call back. Patient states it may be next month or the next before she can come in. RN advised patient of the importance of follow up and patient verbalized understanding stating she has her pap smear annually.

## 2017-11-14 NOTE — Telephone Encounter (Signed)
Please change recall for one month from now.  Thanks.

## 2017-11-17 NOTE — Telephone Encounter (Signed)
Extended recall for 1 month -eh

## 2017-12-18 ENCOUNTER — Ambulatory Visit: Payer: BC Managed Care – PPO | Admitting: Internal Medicine

## 2017-12-18 ENCOUNTER — Encounter: Payer: Self-pay | Admitting: Internal Medicine

## 2017-12-18 DIAGNOSIS — N3001 Acute cystitis with hematuria: Secondary | ICD-10-CM

## 2017-12-18 LAB — POCT URINALYSIS DIPSTICK
Bilirubin, UA: NEGATIVE
Glucose, UA: NEGATIVE
Ketones, UA: NEGATIVE
Nitrite, UA: NEGATIVE
Spec Grav, UA: 1.02 (ref 1.010–1.025)
Urobilinogen, UA: 0.2 E.U./dL
pH, UA: 6 (ref 5.0–8.0)

## 2017-12-18 MED ORDER — SULFAMETHOXAZOLE-TRIMETHOPRIM 800-160 MG PO TABS: 1.0000 | ORAL_TABLET | Freq: Two times a day (BID) | ORAL | 0 refills | Status: DC

## 2017-12-18 NOTE — Patient Instructions (Signed)
You do have a bladder infection and we have sent in bactrim to take. Take 1 pill twice a day for 5 days for the symptoms.

## 2017-12-18 NOTE — Progress Notes (Signed)
   Subjective:    Patient ID: VALENTINA ALCOSER, female    DOB: 07/11/1956, 62 y.o.   MRN: [id number]  HPI The patient is a 62 YO female coming in for urinary symptoms. She is having to urinate a lot. There is some burning with urination. Denies stomach pain or back pain. Mild suprapubic discomfort. Denies fevers or chills. Denies nausea and is working on extra fluids. Going on for about 4 days and worsening. Has not tried anything for it. Does have concurrent HIV with prior normal CD4 but persistent low level viral loads. She is currently not taking descovy and tivicay or anything for HIV and does not want to start taking medications right now.   Review of Systems  Constitutional: Negative.   HENT: Negative.   Eyes: Negative.   Respiratory: Negative for cough, chest tightness and shortness of breath.   Cardiovascular: Negative for chest pain, palpitations and leg swelling.  Gastrointestinal: Negative for abdominal distention, abdominal pain, constipation, diarrhea, nausea and vomiting.  Genitourinary: Positive for dysuria, frequency and urgency.  Musculoskeletal: Negative.   Skin: Negative.   Neurological: Negative.   Psychiatric/Behavioral: Negative.       Objective:   Physical Exam  Constitutional: She is oriented to person, place, and time. She appears well-developed and well-nourished.  HENT:  Head: Normocephalic and atraumatic.  Eyes: EOM are normal.  Neck: Normal range of motion.  Cardiovascular: Normal rate and regular rhythm.  Pulmonary/Chest: Effort normal and breath sounds normal. No respiratory distress. She has no wheezes. She has no rales.  Abdominal: Soft. Bowel sounds are normal. She exhibits no distension. There is no tenderness. There is no rebound.  Musculoskeletal: She exhibits no edema.  Neurological: She is alert and oriented to person, place, and time. Coordination normal.  Skin: Skin is warm and dry.  Psychiatric: She has a normal mood and affect.   Vitals:   12/18/17 1301  BP: 130/88  Pulse: 100  Temp: 99.3 F (37.4 C)  TempSrc: Oral  SpO2: 98%  Weight: 153 lb (69.4 kg)  Height: 5' 3.25" (1.607 m)      Assessment & Plan:

## 2017-12-18 NOTE — Assessment & Plan Note (Signed)
U/A done in the office and consistent with UTI. Rx for bactrim 5 day course.

## 2018-01-06 NOTE — Telephone Encounter (Signed)
Called patient and scheduled AEX /PAP for 02-06-18-eh

## 2018-01-15 ENCOUNTER — Telehealth: Payer: Self-pay

## 2018-01-15 DIAGNOSIS — Z1211 Encounter for screening for malignant neoplasm of colon: Secondary | ICD-10-CM

## 2018-01-15 NOTE — Telephone Encounter (Signed)
Spoke with patient earlier today regarding being due for Cologuard screening again. Patient gave verbal okay to order test/screening for her again. Order faxed today for patient and she has been made aware that the office will contact her with results once they are received.

## 2018-01-27 ENCOUNTER — Other Ambulatory Visit: Payer: Self-pay | Admitting: Internal Medicine

## 2018-01-27 ENCOUNTER — Other Ambulatory Visit (INDEPENDENT_AMBULATORY_CARE_PROVIDER_SITE_OTHER): Payer: BC Managed Care – PPO

## 2018-01-27 ENCOUNTER — Encounter: Payer: Self-pay | Admitting: Internal Medicine

## 2018-01-27 ENCOUNTER — Telehealth: Payer: Self-pay | Admitting: Internal Medicine

## 2018-01-27 ENCOUNTER — Ambulatory Visit: Payer: BC Managed Care – PPO | Admitting: Internal Medicine

## 2018-01-27 DIAGNOSIS — R300 Dysuria: Secondary | ICD-10-CM | POA: Diagnosis not present

## 2018-01-27 LAB — URINALYSIS, ROUTINE W REFLEX MICROSCOPIC
Bilirubin Urine: NEGATIVE
Ketones, ur: NEGATIVE
Nitrite: NEGATIVE
Specific Gravity, Urine: 1.02 (ref 1.000–1.030)
Total Protein, Urine: 30 — AB
Urine Glucose: NEGATIVE
Urobilinogen, UA: 0.2 (ref 0.0–1.0)
pH: 6 (ref 5.0–8.0)

## 2018-01-27 MED ORDER — SULFAMETHOXAZOLE-TRIMETHOPRIM 800-160 MG PO TABS: 1.0000 | ORAL_TABLET | Freq: Two times a day (BID) | ORAL | 0 refills | Status: DC

## 2018-01-27 NOTE — Telephone Encounter (Signed)
Copied from [place]. Topic: Inquiry >> Jan 27, 2018  8:48 AM Aurelio Brash B wrote: Reason for CRM: PT thinks she has a UTI,  She does not want to go to another office,  she is not available to come in tomorrow,  she wants to know if she can drop off urine sample today -  PT states she had uti 2-3 weeks ago. Please call pt at  [phone]

## 2018-01-28 ENCOUNTER — Ambulatory Visit: Payer: BC Managed Care – PPO | Admitting: Family

## 2018-01-28 ENCOUNTER — Ambulatory Visit: Payer: BC Managed Care – PPO | Admitting: Internal Medicine

## 2018-01-29 LAB — URINE CULTURE
MICRO NUMBER:: 90376032
SPECIMEN QUALITY:: ADEQUATE

## 2018-02-06 ENCOUNTER — Other Ambulatory Visit (HOSPITAL_COMMUNITY)
Admission: RE | Admit: 2018-02-06 | Discharge: 2018-02-06 | Disposition: A | Discharge status: Home / Self Care | Payer: BC Managed Care – PPO | Source: Ambulatory Visit | Attending: Obstetrics & Gynecology | Admitting: Obstetrics & Gynecology

## 2018-02-06 ENCOUNTER — Encounter: Payer: Self-pay | Admitting: Obstetrics & Gynecology

## 2018-02-06 ENCOUNTER — Ambulatory Visit (INDEPENDENT_AMBULATORY_CARE_PROVIDER_SITE_OTHER): Payer: BC Managed Care – PPO | Admitting: Obstetrics & Gynecology

## 2018-02-06 ENCOUNTER — Other Ambulatory Visit: Payer: Self-pay

## 2018-02-06 VITALS — BP 126/80 | HR 88 | Resp 18 | Ht 62.5 in | Wt 156.8 lb

## 2018-02-06 DIAGNOSIS — Z124 Encounter for screening for malignant neoplasm of cervix: Secondary | ICD-10-CM | POA: Insufficient documentation

## 2018-02-06 DIAGNOSIS — R8781 Cervical 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 (HPV) DNA test positive: Secondary | ICD-10-CM | POA: Diagnosis not present

## 2018-02-06 DIAGNOSIS — R3915 Urgency of urination: Secondary | ICD-10-CM | POA: Diagnosis not present

## 2018-02-06 DIAGNOSIS — Z01419 Encounter for gynecological examination (general) (routine) without abnormal findings: Secondary | ICD-10-CM

## 2018-02-06 LAB — POCT URINALYSIS DIPSTICK
Bilirubin, UA: NEGATIVE
Blood, UA: NEGATIVE
Glucose, UA: NEGATIVE
Ketones, UA: NEGATIVE
Nitrite, UA: NEGATIVE
Protein, UA: NEGATIVE
Urobilinogen, UA: 0.2 E.U./dL
pH, UA: 5 (ref 5.0–8.0)

## 2018-02-06 MED ORDER — ESTRADIOL 0.1 MG/GM VA CREA: TOPICAL_CREAM | VAGINAL | 2 refills | Status: DC

## 2018-02-06 MED ORDER — ACYCLOVIR 5 % EX OINT: 1.0000 "application " | TOPICAL_OINTMENT | CUTANEOUS | 2 refills | Status: DC

## 2018-02-06 NOTE — Progress Notes (Signed)
62 y.o. [id number] DivorcedAfrican AmericanF here for annual exam.  Reports she had two UTIs in the last two months.  This is atypical for her.  She does not feel like she has symptoms today.  Reports she does have some nocturia.  Denies hematuria.  Denies vaginal bleeding.  Still having some vaginal dryness.   Would like RF for Zovirax 5% cream.   Patient's last menstrual period was 11/04/1997.          Sexually active: Yes.    The current method of family planning is post menopausal status, condoms.     Exercising: No.   Smoker:  no  Health Maintenance: Pap:  10/10/16 Neg. HR HPV:+detected #16, 18 Neg   03/01/14 Neg. HR HPV:neg  History of abnormal Pap:  yes MMG:  09/04/17 BIRADS1:Neg  Colonoscopy:  Cologuard 01/2018 neg  BMD:   Never TDaP:  2017 Pneumonia vaccine(s):  2011 Shingrix:   No Hep C testing: done Screening Labs: PCP   reports that she quit smoking about 27 years ago. Her smoking use included cigarettes. She has never used smokeless tobacco. She reports that she does not drink alcohol or use drugs.  Past Medical History:  Diagnosis Date  . Abnormal Pap smear of cervix before 1995   always a repeat back to normal except for 1 time and had colpo bioosy -resutls were normal  . Allergic rhinitis   . Anxiety   . Bell's palsy after 1995 ?   no residual SE.  [name] Depression   . Genital herpes   . History of drug abuse    drug usser [place].  cocaine  . HIV (human immunodeficiency virus infection) ([place]) 1990   from sexual contact who is unknown  . Syphilis     Past Surgical History:  Procedure Laterality Date  . CESAREAN SECTION  1997    Current Outpatient Medications  Medication Sig Dispense Refill  . albuterol (PROVENTIL HFA;VENTOLIN HFA) 108 (90 Base) MCG/ACT inhaler Inhale 2 puffs into the lungs every 6 (six) hours as needed for wheezing or shortness of breath. 1 Inhaler 2  . fluticasone (FLONASE) 50 MCG/ACT nasal spray     . valACYclovir (VALTREX) 500 MG  tablet For 5 -10 days, as needed for outbreaks. 30 tablet 12   No current facility-administered medications for this visit.     Family History  Problem Relation Age of Onset  . Diabetes Father   . Breast cancer Other     Review of Systems  HENT: Positive for congestion.   Gastrointestinal: Positive for constipation.  Genitourinary: Positive for frequency and urgency.       Pain with intercourse   Neurological: Positive for headaches.  All other systems reviewed and are negative.   Exam:   BP 126/80 (BP Location: Right Arm, Patient Position: Sitting, Cuff Size: Normal)   Pulse 88   Resp 18   Ht 5' 2.5" (1.588 m)   Wt 156 lb 12.8 oz (71.1 kg)   LMP 11/04/1997   BMI 28.22 kg/m  Height: 5' 2.5" (158.8 cm)  Ht Readings from Last 3 Encounters:  02/06/18 5' 2.5" (1.588 m)  12/18/17 5' 3.25" (1.607 m)  07/02/17 5' 3.25" (1.607 m)    General appearance: alert, cooperative and appears stated age Head: Normocephalic, without obvious abnormality, atraumatic Neck: no adenopathy, supple, symmetrical, trachea midline and thyroid normal to inspection and palpation Lungs: clear to auscultation bilaterally Breasts: normal appearance, no masses or tenderness Heart: regular rate and rhythm  Abdomen: soft, non-tender; bowel sounds normal; no masses,  no organomegaly Extremities: extremities normal, atraumatic, no cyanosis or edema Skin: Skin color, texture, turgor normal. No rashes or lesions Lymph nodes: Cervical, supraclavicular, and axillary nodes normal. No abnormal inguinal nodes palpated Neurologic: Grossly normal   Pelvic: External genitalia:  no lesions              Urethra:  normal appearing urethra with no masses, tenderness or lesions              Bartholins and Skenes: normal                 Vagina: normal appearing vagina with normal color and discharge, no lesions              Cervix: no lesions              Pap taken: Yes.   Bimanual Exam:  Uterus:  normal size,  contour, position, consistency, mobility, non-tender              Adnexa: normal adnexa and no mass, fullness, tenderness               Rectovaginal: Confirms               Anus:  normal sphincter tone, no lesions  Chaperone was present for exam.  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  Doing 3D. pap smear and HR HPV obtained today On Valtrex.  Rx for Zovirax 5% topically TID for up to 5 days with symptoms.  Rx to pharmacy with RFs Estrace vaginal cream 1 grm pv twice weekly.  #42.5 gm/2RF Urine culture pending return annually or prn

## 2018-02-07 LAB — COLOGUARD: Cologuard: NEGATIVE

## 2018-02-08 LAB — URINE CULTURE

## 2018-02-10 ENCOUNTER — Encounter: Payer: Self-pay | Admitting: Internal Medicine

## 2018-02-10 LAB — CYTOLOGY - PAP
Diagnosis: NEGATIVE
HPV 16/18/45 genotyping: NEGATIVE
HPV: DETECTED — AB

## 2018-02-18 ENCOUNTER — Encounter: Payer: Self-pay | Admitting: Internal Medicine

## 2018-02-18 ENCOUNTER — Other Ambulatory Visit: Payer: BC Managed Care – PPO

## 2018-02-18 ENCOUNTER — Ambulatory Visit: Payer: BC Managed Care – PPO | Admitting: Internal Medicine

## 2018-02-18 VITALS — BP 130/78 | HR 78 | Temp 98.8°F | Ht 62.5 in | Wt 158.0 lb

## 2018-02-18 DIAGNOSIS — B20 Human immunodeficiency virus [HIV] disease: Secondary | ICD-10-CM

## 2018-02-18 DIAGNOSIS — K1120 Sialoadenitis, unspecified: Secondary | ICD-10-CM

## 2018-02-18 MED ORDER — AMOXICILLIN-POT CLAVULANATE 875-125 MG PO TABS: 1.0000 | ORAL_TABLET | Freq: Two times a day (BID) | ORAL | 0 refills | Status: DC

## 2018-02-18 NOTE — Assessment & Plan Note (Signed)
Suspect cause, rx for augmentin 10 days. If no improvement needs re-evaluation. At risk for LAD in general with concurrent HIV and checking CD4 for immune status.

## 2018-02-18 NOTE — Patient Instructions (Signed)
We have sent in augmentin to take 1 pill twice a day for 10 days.   We are checking the labs today and will call back with results.

## 2018-02-18 NOTE — Assessment & Plan Note (Signed)
Needs HIV quant and CD4 count as none in about 6 months. She does not elect to take any medications for hiv and may reconsider if needed for falling cd4 count.

## 2018-02-18 NOTE — Progress Notes (Signed)
   Subjective:    Patient ID: Stephanie Espinoza, female    DOB: Jul 10, 1956, 61 y.o.   MRN: [id number]  HPI The patient is a 62 YO female coming in for new swelling along the left jaw. Started Monday and was painful. She denies new dental problems although she will be getting some extractions in the near future. No infection. Denies fevers or chills. No ear pain. Mild sinus drainage but no headaches or sinus pressure. Took ibuprofen for it and appears to be less painful today.   Review of Systems  Constitutional: Positive for appetite change. Negative for activity change, chills, fatigue, fever and unexpected weight change.  HENT: Positive for facial swelling and postnasal drip. Negative for congestion, dental problem, drooling, ear discharge, ear pain, mouth sores, rhinorrhea, sinus pressure, sinus pain, sneezing, sore throat, tinnitus, trouble swallowing and voice change.   Eyes: Negative.   Respiratory: Negative for cough, chest tightness, shortness of breath and wheezing.   Cardiovascular: Negative.   Gastrointestinal: Negative.   Neurological: Negative.       Objective:   Physical Exam  Constitutional: She is oriented to person, place, and time. She appears well-developed and well-nourished.  HENT:  Head: Normocephalic and atraumatic.  TMs normal bilaterally, oropharynx without any signs of infected or broken teeth. Swelling left jaw close to the ear, no pain with tugging on the ear. Feels to be near saliva glands  Eyes: EOM are normal.  Neck: Normal range of motion. No thyromegaly present.  Cardiovascular: Normal rate and regular rhythm.  Pulmonary/Chest: Effort normal and breath sounds normal. No respiratory distress. She has no wheezes. She has no rales.  Abdominal: Soft.  Musculoskeletal: She exhibits tenderness.  Lymphadenopathy:    She has no cervical adenopathy.  Neurological: She is alert and oriented to person, place, and time.  Skin: Skin is warm and dry.   Vitals:   02/18/18 1122  BP: 130/78  Pulse: 78  Temp: 98.8 F (37.1 C)  TempSrc: Oral  SpO2: 97%  Weight: 158 lb (71.7 kg)  Height: 5' 2.5" (1.588 m)      Assessment & Plan:

## 2018-02-20 LAB — HIV-1 RNA QUANT-NO REFLEX-BLD
HIV 1 RNA Quant: 1850 copies/mL — ABNORMAL HIGH
HIV-1 RNA Quant, Log: 3.27 Log copies/mL — ABNORMAL HIGH

## 2018-02-23 ENCOUNTER — Other Ambulatory Visit: Payer: Self-pay | Admitting: Internal Medicine

## 2018-02-23 DIAGNOSIS — B20 Human immunodeficiency virus [HIV] disease: Secondary | ICD-10-CM

## 2018-02-24 ENCOUNTER — Telehealth: Payer: Self-pay

## 2018-02-24 ENCOUNTER — Other Ambulatory Visit: Payer: Self-pay | Admitting: Internal Medicine

## 2018-02-24 DIAGNOSIS — B20 Human immunodeficiency virus [HIV] disease: Secondary | ICD-10-CM

## 2018-02-24 NOTE — Telephone Encounter (Signed)
LVM informing patient that she would need to come back to the lab to have a test re-drawn due to confusion in tubes to use and where the testing would take place. Informed patient the hours of the lab and that she could come back at he convenience to get that done.

## 2018-05-01 ENCOUNTER — Other Ambulatory Visit: Payer: BC Managed Care – PPO

## 2018-05-01 ENCOUNTER — Encounter: Payer: Self-pay | Admitting: Internal Medicine

## 2018-05-01 ENCOUNTER — Ambulatory Visit: Payer: BC Managed Care – PPO | Admitting: Internal Medicine

## 2018-05-01 VITALS — BP 122/80 | HR 85 | Temp 98.3°F | Ht 62.5 in | Wt 153.0 lb

## 2018-05-01 DIAGNOSIS — B20 Human immunodeficiency virus [HIV] disease: Secondary | ICD-10-CM | POA: Diagnosis not present

## 2018-05-01 DIAGNOSIS — M5431 Sciatica, right side: Secondary | ICD-10-CM | POA: Diagnosis not present

## 2018-05-01 DIAGNOSIS — R350 Frequency of micturition: Secondary | ICD-10-CM | POA: Diagnosis not present

## 2018-05-01 LAB — POCT URINALYSIS DIPSTICK
Bilirubin, UA: NEGATIVE
Blood, UA: NEGATIVE
Glucose, UA: NEGATIVE
Ketones, UA: NEGATIVE
Nitrite, UA: NEGATIVE
Protein, UA: NEGATIVE
Spec Grav, UA: 1.015 (ref 1.010–1.025)
Urobilinogen, UA: 0.2 E.U./dL
pH, UA: 6 (ref 5.0–8.0)

## 2018-05-01 MED ORDER — SULFAMETHOXAZOLE-TRIMETHOPRIM 800-160 MG PO TABS: 1.0000 | ORAL_TABLET | Freq: Two times a day (BID) | ORAL | 0 refills | Status: DC

## 2018-05-01 NOTE — Assessment & Plan Note (Addendum)
Not on treatment and persistent viral load detected. She does not want to pursue treatment at this time. Last visit CD4 was not done so talked with lab and it will be drawn correctly this time. Also checking RPR as none in some time. Recent viral load. Given recent infections she could be having progression of her disease.

## 2018-05-01 NOTE — Assessment & Plan Note (Signed)
Done handicapped form today. Using ibuprofen for pain and has done PT in the past. She has been recommended surgery in the past and does not want to pursue that currently.

## 2018-05-01 NOTE — Progress Notes (Signed)
   Subjective:    Patient ID: Stephanie Espinoza, female    DOB: 1956/04/19, 62 y.o.   MRN: [id number]  HPI The patient is a 62 YO female coming in for several concerns including urinary pain and frequency(started a few days ago, denies stomach pain, chronic back pain, denies nausea or vomiting) as well as HIV (not on treatment currently, due to lab error did not have CD4 count draw at last visit in April, does have a detected viral load, several recent urinary tract infections in the last month, denies weight change or fatigue), as well as chronic low back pain (causes radiation to the right leg with weakness and numbness intermittent, seeing specialist and they recommended back surgery, she does not want surgery now, has done physical therapy and uses inverter at home which helps, she needs handicapped sticker so she can go grocery shopping etc).   Review of Systems  Constitutional: Negative.   HENT: Negative.   Eyes: Negative.   Respiratory: Negative for cough, chest tightness and shortness of breath.   Cardiovascular: Negative for chest pain, palpitations and leg swelling.  Gastrointestinal: Negative for abdominal distention, abdominal pain, constipation, diarrhea, nausea and vomiting.  Genitourinary: Positive for dysuria, frequency and urgency. Negative for difficulty urinating and flank pain.  Musculoskeletal: Positive for arthralgias, back pain and gait problem. Negative for joint swelling and myalgias.  Skin: Negative.   Neurological: Positive for weakness and numbness. Negative for dizziness, facial asymmetry, speech difficulty and headaches.  Psychiatric/Behavioral: Negative.       Objective:   Physical Exam  Constitutional: She is oriented to person, place, and time. She appears well-developed and well-nourished.  HENT:  Head: Normocephalic and atraumatic.  Eyes: EOM are normal.  Neck: Normal range of motion.  Cardiovascular: Normal rate and regular rhythm.  Pulmonary/Chest: Effort  normal and breath sounds normal. No respiratory distress. She has no wheezes. She has no rales.  Abdominal: Soft. Bowel sounds are normal. She exhibits no distension. There is no tenderness. There is no rebound.  Musculoskeletal: She exhibits tenderness. She exhibits no edema.  Lumbar back pain spinally and paraspinally, no flank tenderness  Neurological: She is alert and oriented to person, place, and time. Coordination normal.  Skin: Skin is warm and dry.  Psychiatric: She has a normal mood and affect.   Vitals:   05/01/18 1353  BP: 122/80  Pulse: 85  Temp: 98.3 F (36.8 C)  TempSrc: Oral  SpO2: 97%  Weight: 153 lb (69.4 kg)  Height: 5' 2.5" (1.588 m)      Assessment & Plan:

## 2018-05-01 NOTE — Patient Instructions (Addendum)
We are checking the labs today and will call you back about the results.    We have sent in bactrim to take 1 pill twice a day for 5 days.

## 2018-05-01 NOTE — Addendum Note (Signed)
Addended by: Aviva Signs M on: 05/01/2018 02:54 PM   Modules accepted: Orders

## 2018-05-01 NOTE — Assessment & Plan Note (Signed)
Some signs of UTI and will treat with bactrim. Urine culture given several UTI in the last few months. Needs CD4 count today to check status of immune system.

## 2018-05-03 LAB — T-HELPER CELLS (CD4) COUNT (NOT AT ARMC)
Absolute CD4: 223 cells/uL — ABNORMAL LOW (ref 490–1740)
CD4 T Helper %: 12 % — ABNORMAL LOW (ref 30–61)
Total lymphocyte count: 1812 cells/uL (ref 850–3900)

## 2018-05-04 LAB — RPR: RPR Ser Ql: NONREACTIVE

## 2018-05-04 LAB — URINE CULTURE
MICRO NUMBER:: 90775557
SPECIMEN QUALITY:: ADEQUATE

## 2018-05-04 LAB — TIQ- AMBIGUOUS ORDER

## 2018-05-04 LAB — T-HELPER CELLS (CD4) COUNT (NOT AT ARMC)

## 2018-06-30 ENCOUNTER — Encounter: Payer: Self-pay | Admitting: Internal Medicine

## 2018-06-30 ENCOUNTER — Ambulatory Visit: Payer: BC Managed Care – PPO | Admitting: Internal Medicine

## 2018-06-30 ENCOUNTER — Other Ambulatory Visit (INDEPENDENT_AMBULATORY_CARE_PROVIDER_SITE_OTHER): Payer: BC Managed Care – PPO

## 2018-06-30 VITALS — BP 116/86 | HR 96 | Temp 98.4°F | Ht 62.5 in | Wt 162.0 lb

## 2018-06-30 DIAGNOSIS — B20 Human immunodeficiency virus [HIV] disease: Secondary | ICD-10-CM

## 2018-06-30 DIAGNOSIS — R51 Headache: Secondary | ICD-10-CM

## 2018-06-30 DIAGNOSIS — J0110 Acute frontal sinusitis, unspecified: Secondary | ICD-10-CM

## 2018-06-30 DIAGNOSIS — G4452 New daily persistent headache (NDPH): Secondary | ICD-10-CM | POA: Diagnosis not present

## 2018-06-30 DIAGNOSIS — R519 Headache, unspecified: Secondary | ICD-10-CM | POA: Insufficient documentation

## 2018-06-30 LAB — COMPREHENSIVE METABOLIC PANEL WITH GFR
ALT: 13 U/L (ref 0–35)
AST: 18 U/L (ref 0–37)
Albumin: 3.5 g/dL (ref 3.5–5.2)
Alkaline Phosphatase: 98 U/L (ref 39–117)
BUN: 17 mg/dL (ref 6–23)
CO2: 29 meq/L (ref 19–32)
Calcium: 9.7 mg/dL (ref 8.4–10.5)
Chloride: 106 meq/L (ref 96–112)
Creatinine, Ser: 1.23 mg/dL — ABNORMAL HIGH (ref 0.40–1.20)
GFR: 47.37 mL/min — ABNORMAL LOW (ref >60.00)
Glucose, Bld: 100 mg/dL — ABNORMAL HIGH (ref 70–99)
Potassium: 3.6 meq/L (ref 3.5–5.1)
Sodium: 140 meq/L (ref 135–145)
Total Bilirubin: 0.6 mg/dL (ref 0.2–1.2)
Total Protein: 8.7 g/dL — ABNORMAL HIGH (ref 6.0–8.3)

## 2018-06-30 LAB — CBC
HCT: 36.4 % (ref 36.0–46.0)
Hemoglobin: 11.8 g/dL — ABNORMAL LOW (ref 12.0–15.0)
MCHC: 32.5 g/dL (ref 30.0–36.0)
MCV: 85.3 fl (ref 78.0–100.0)
Platelets: 225 K/uL (ref 150.0–400.0)
RBC: 4.27 Mil/uL (ref 3.87–5.11)
RDW: 15.4 % (ref 11.5–15.5)
WBC: 4 K/uL (ref 4.0–10.5)

## 2018-06-30 LAB — SEDIMENTATION RATE: Sed Rate: 117 mm/hr — ABNORMAL HIGH (ref 0–30)

## 2018-06-30 MED ORDER — AMOXICILLIN-POT CLAVULANATE 875-125 MG PO TABS: 1.0000 | ORAL_TABLET | Freq: Two times a day (BID) | ORAL | 0 refills | Status: DC

## 2018-06-30 NOTE — Assessment & Plan Note (Signed)
Given that she is immune compromised will treat although she is not 2 weeks into infection. Rx for augmentin. Checking CBC, CMP, hiv studies.

## 2018-06-30 NOTE — Assessment & Plan Note (Signed)
Concerning given declining CD4 count previously. Will recheck quant (with reflex to genotype in case she decides to do therapy) and CD4 count today. She is having infections more often now. She is not wanting to start therapy today but depending on levels may be willing to do this. If her CD4 counts are <200 she will need ppx therapy to prevent infections.

## 2018-06-30 NOTE — Assessment & Plan Note (Signed)
Could be related to sinus congestion but given location at the temples will check ESR to rule out temporal arteritis. With her uncontrolled HIV this is an inflammatory process.

## 2018-06-30 NOTE — Progress Notes (Signed)
   Subjective:    Patient ID: Stephanie Espinoza, female    DOB: 1956/08/25, 62 y.o.   MRN: [id number]  HPI The patient is a 62 YO female coming in for temperature and sore throat with sinus pressure. Has concurrent HIV with declining CD4 counts (last 223). She is not on treatment for this currently. Started about 2-3 days ago. Temps of 100s and feeling feverish. She is also having some mild headaches across her temples. She is having some drainage and sore throat. Denies cough or SOB. Has not used her albuterol more often. She denies chest pains or palpitations. She denies abdominal complaints. Not eating well over this time and forcing herself to do fluids. No change in vision.   Review of Systems  Constitutional: Positive for activity change, appetite change, chills, fatigue and fever. Negative for unexpected weight change.  HENT: Positive for congestion, ear pain, postnasal drip, rhinorrhea, sinus pressure and sore throat. Negative for ear discharge, sinus pain, sneezing, tinnitus, trouble swallowing and voice change.   Eyes: Negative.   Respiratory: Negative for cough, chest tightness, shortness of breath and wheezing.   Cardiovascular: Negative.   Gastrointestinal: Negative.   Musculoskeletal: Positive for myalgias.  Skin: Negative.   Neurological: Positive for headaches.  Psychiatric/Behavioral: Negative.       Objective:   Physical Exam  Constitutional: She is oriented to person, place, and time. She appears well-developed and well-nourished.  HENT:  Head: Normocephalic and atraumatic.  Oropharynx with redness and clear drainage, nose with swollen turbinates, TMs bulging bilaterally with some redness  Eyes: EOM are normal.  Neck: Normal range of motion. No thyromegaly present.  Cardiovascular: Normal rate and regular rhythm.  Pulmonary/Chest: Effort normal and breath sounds normal. No respiratory distress. She has no wheezes. She has no rales.  Abdominal: Soft.  Musculoskeletal: She  exhibits tenderness.  Lymphadenopathy:    She has no cervical adenopathy.  Neurological: She is alert and oriented to person, place, and time.  Skin: Skin is warm and dry.  Psychiatric: She has a normal mood and affect.   Vitals:   06/30/18 0917  BP: 116/86  Pulse: 96  Temp: 98.4 F (36.9 C)  TempSrc: Oral  SpO2: 98%  Weight: 162 lb (73.5 kg)  Height: 5' 2.5" (1.588 m)      Assessment & Plan:

## 2018-06-30 NOTE — Patient Instructions (Signed)
We have sent in augmentin for the sinuses. Take 1 pill twice a day for 10 days.   We will check the labs today and call you back about the results.

## 2018-07-01 LAB — T-HELPER CELLS (CD4) COUNT (NOT AT ARMC)
% CD 4 Pos. Lymph.: 12.5 % — ABNORMAL LOW (ref 30.8–58.5)
Absolute CD 4 Helper: 238 /uL — ABNORMAL LOW (ref 359–1519)
Basophils Absolute: 0 10*3/uL (ref 0.0–0.2)
Basos: 1 %
EOS (ABSOLUTE): 0.1 10*3/uL (ref 0.0–0.4)
Eos: 3 %
Hematocrit: 36.7 % (ref 34.0–46.6)
Hemoglobin: 11.8 g/dL (ref 11.1–15.9)
Immature Grans (Abs): 0 10*3/uL (ref 0.0–0.1)
Immature Granulocytes: 0 %
Lymphocytes Absolute: 1.9 10*3/uL (ref 0.7–3.1)
Lymphs: 48 %
MCH: 28 pg (ref 26.6–33.0)
MCHC: 32.2 g/dL (ref 31.5–35.7)
MCV: 87 fL (ref 79–97)
Monocytes Absolute: 0.5 10*3/uL (ref 0.1–0.9)
Monocytes: 12 %
Neutrophils Absolute: 1.4 10*3/uL (ref 1.4–7.0)
Neutrophils: 36 %
Platelets: 238 10*3/uL (ref 150–450)
RBC: 4.21 x10E6/uL (ref 3.77–5.28)
RDW: 14 % (ref 12.3–15.4)
WBC: 4 10*3/uL (ref 3.4–10.8)

## 2018-07-02 ENCOUNTER — Encounter: Payer: Self-pay | Admitting: Internal Medicine

## 2018-07-11 LAB — HIV-1 RNA ULTRAQUANT REFLEX TO GENTYP+
HIV 1 RNA Quant: 2250 Copies/mL — ABNORMAL HIGH
HIV-1 RNA Quant, Log: 3.35 Log cps/mL — ABNORMAL HIGH

## 2018-07-11 LAB — EXTRA LAV TOP TUBE

## 2018-07-11 LAB — RFLX HIV-1 INTEGRASE GENOTYPE: HIV-1 Genotype: DETECTED — AB

## 2018-07-13 ENCOUNTER — Other Ambulatory Visit: Payer: Self-pay | Admitting: Internal Medicine

## 2018-07-13 ENCOUNTER — Other Ambulatory Visit: Payer: Self-pay | Admitting: Family Medicine

## 2018-08-24 ENCOUNTER — Encounter: Payer: Self-pay | Admitting: Internal Medicine

## 2018-08-24 ENCOUNTER — Ambulatory Visit: Payer: BC Managed Care – PPO | Admitting: Internal Medicine

## 2018-08-24 VITALS — BP 130/80 | HR 89 | Temp 98.3°F | Ht 62.5 in | Wt 166.0 lb

## 2018-08-24 DIAGNOSIS — J0110 Acute frontal sinusitis, unspecified: Secondary | ICD-10-CM

## 2018-08-24 DIAGNOSIS — B20 Human immunodeficiency virus [HIV] disease: Secondary | ICD-10-CM

## 2018-08-24 MED ORDER — AMOXICILLIN-POT CLAVULANATE 875-125 MG PO TABS: 1.0000 | ORAL_TABLET | Freq: Two times a day (BID) | ORAL | 0 refills | Status: DC

## 2018-08-24 NOTE — Patient Instructions (Signed)
We have sent in the augmentin to start taking 1 pill twice a day for 1 week.

## 2018-08-24 NOTE — Assessment & Plan Note (Signed)
We talked today about her starting HIV medication. She will make a visit before the end of the year to see ID and discuss options but she is not sure about starting medication. CD4 not <200 yet but we talked about how she may need preventative antibiotics in the near future. She is having many recurrent sinus infections recently likely due to immune system not being as active.

## 2018-08-24 NOTE — Assessment & Plan Note (Signed)
Rx for augmentin

## 2018-08-24 NOTE — Progress Notes (Signed)
   Subjective:    Patient ID: Stephanie Espinoza, female    DOB: 1955/12/11, 62 y.o.   MRN: [id number]  HPI The patient is a 62 YO female coming in for sinus problems. She does have concurrent HIV with down trending CD4 counts. Most recent CD4 238. She does not take medication for her HIV currently. Sees ID usually once a year. She started having sinus symptoms about 1-2 weeks ago. Mostly sneezing and drainage. Then some chills/fever over the weekend and more coughing. She denies SOB. Denies headaches. Some ear pain/pressure. Some green nose drainage. Mostly dry cough. Taking theraflu with mild relief that does not last. Taking flonase which is not helping much. Overall worsening.   Review of Systems  Constitutional: Positive for chills and fever. Negative for activity change, appetite change, fatigue and unexpected weight change.  HENT: Positive for congestion, ear pain, postnasal drip, rhinorrhea, sinus pressure and sore throat. Negative for ear discharge, sinus pain, sneezing, tinnitus, trouble swallowing and voice change.   Eyes: Negative.   Respiratory: Positive for cough. Negative for chest tightness, shortness of breath and wheezing.   Cardiovascular: Negative.   Gastrointestinal: Negative.   Musculoskeletal: Positive for myalgias.  Neurological: Negative.       Objective:   Physical Exam  Constitutional: She is oriented to person, place, and time. She appears well-developed and well-nourished.  HENT:  Head: Normocephalic and atraumatic.  Oropharynx with redness and clear drainage, nose with swollen turbinates, TMs normal bilaterally  Eyes: EOM are normal.  Neck: Normal range of motion. No thyromegaly present.  Cardiovascular: Normal rate and regular rhythm.  Pulmonary/Chest: Effort normal and breath sounds normal. No respiratory distress. She has no wheezes. She has no rales.  Abdominal: Soft.  Musculoskeletal: She exhibits tenderness.  Lymphadenopathy:    She has no cervical  adenopathy.  Neurological: She is alert and oriented to person, place, and time.  Skin: Skin is warm and dry.   Vitals:   08/24/18 1048  BP: 130/80  Pulse: 89  Temp: 98.3 F (36.8 C)  TempSrc: Oral  SpO2: 96%  Weight: 166 lb (75.3 kg)  Height: 5' 2.5" (1.588 m)      Assessment & Plan:

## 2018-08-25 ENCOUNTER — Encounter: Payer: Self-pay | Admitting: Internal Medicine

## 2018-09-01 ENCOUNTER — Other Ambulatory Visit: Payer: BC Managed Care – PPO

## 2018-09-10 ENCOUNTER — Other Ambulatory Visit: Payer: Self-pay | Admitting: Behavioral Health

## 2018-09-10 ENCOUNTER — Other Ambulatory Visit (HOSPITAL_COMMUNITY)
Admission: RE | Admit: 2018-09-10 | Discharge: 2018-09-10 | Disposition: A | Discharge status: Home / Self Care | Payer: BC Managed Care – PPO | Source: Ambulatory Visit | Attending: Internal Medicine | Admitting: Internal Medicine

## 2018-09-10 ENCOUNTER — Other Ambulatory Visit: Payer: BC Managed Care – PPO

## 2018-09-10 DIAGNOSIS — B20 Human immunodeficiency virus [HIV] disease: Secondary | ICD-10-CM

## 2018-09-10 DIAGNOSIS — Z113 Encounter for screening for infections with a predominantly sexual mode of transmission: Secondary | ICD-10-CM

## 2018-09-10 DIAGNOSIS — Z79899 Other long term (current) drug therapy: Secondary | ICD-10-CM

## 2018-09-11 ENCOUNTER — Other Ambulatory Visit: Payer: BC Managed Care – PPO

## 2018-09-11 LAB — URINE CYTOLOGY ANCILLARY ONLY
Chlamydia: NEGATIVE
Neisseria Gonorrhea: NEGATIVE

## 2018-09-11 LAB — T-HELPER CELL (CD4) - (RCID CLINIC ONLY)
CD4 % Helper T Cell: 15 % — ABNORMAL LOW (ref 33–55)
CD4 T Cell Abs: 390 /uL — ABNORMAL LOW (ref 400–2700)

## 2018-09-12 LAB — COMPREHENSIVE METABOLIC PANEL
AG Ratio: 0.9 (calc) — ABNORMAL LOW (ref 1.0–2.5)
ALT: 16 U/L (ref 6–29)
AST: 19 U/L (ref 10–35)
Albumin: 3.6 g/dL (ref 3.6–5.1)
Alkaline phosphatase (APISO): 78 U/L (ref 33–130)
BUN: 13 mg/dL (ref 7–25)
CO2: 27 mmol/L (ref 20–32)
Calcium: 9.3 mg/dL (ref 8.6–10.4)
Chloride: 106 mmol/L (ref 98–110)
Creat: 0.97 mg/dL (ref 0.50–0.99)
Globulin: 4 g/dL (calc) — ABNORMAL HIGH (ref 1.9–3.7)
Glucose, Bld: 87 mg/dL (ref 65–99)
Potassium: 3.8 mmol/L (ref 3.5–5.3)
Sodium: 140 mmol/L (ref 135–146)
Total Bilirubin: 0.5 mg/dL (ref 0.2–1.2)
Total Protein: 7.6 g/dL (ref 6.1–8.1)

## 2018-09-12 LAB — LIPID PANEL
Cholesterol: 191 mg/dL (ref <200)
HDL: 43 mg/dL — ABNORMAL LOW (ref >50)
LDL Cholesterol (Calc): 115 mg/dL (calc) — ABNORMAL HIGH
Non-HDL Cholesterol (Calc): 148 mg/dL (calc) — ABNORMAL HIGH (ref <130)
Total CHOL/HDL Ratio: 4.4 (calc) (ref <5.0)
Triglycerides: 221 mg/dL — ABNORMAL HIGH (ref <150)

## 2018-09-12 LAB — CBC WITH DIFFERENTIAL/PLATELET
Basophils Absolute: 50 cells/uL (ref 0–200)
Basophils Relative: 1 %
Eosinophils Absolute: 160 cells/uL (ref 15–500)
Eosinophils Relative: 3.2 %
HCT: 36.2 % (ref 35.0–45.0)
Hemoglobin: 11.7 g/dL (ref 11.7–15.5)
Lymphs Abs: 2775 cells/uL (ref 850–3900)
MCH: 27.5 pg (ref 27.0–33.0)
MCHC: 32.3 g/dL (ref 32.0–36.0)
MCV: 85.2 fL (ref 80.0–100.0)
MPV: 10.4 fL (ref 7.5–12.5)
Monocytes Relative: 8.2 %
Neutro Abs: 1605 cells/uL (ref 1500–7800)
Neutrophils Relative %: 32.1 %
Platelets: 225 10*3/uL (ref 140–400)
RBC: 4.25 10*6/uL (ref 3.80–5.10)
RDW: 12.7 % (ref 11.0–15.0)
Total Lymphocyte: 55.5 %
WBC mixed population: 410 cells/uL (ref 200–950)
WBC: 5 10*3/uL (ref 3.8–10.8)

## 2018-09-12 LAB — HIV-1 RNA QUANT-NO REFLEX-BLD
HIV 1 RNA Quant: <20 copies/mL — AB
HIV-1 RNA Quant, Log: <1.3 Log copies/mL — AB

## 2018-09-12 LAB — RPR: RPR Ser Ql: NONREACTIVE

## 2018-09-15 ENCOUNTER — Encounter: Payer: BC Managed Care – PPO | Admitting: Internal Medicine

## 2018-09-28 ENCOUNTER — Encounter: Payer: Self-pay | Admitting: Internal Medicine

## 2018-09-28 ENCOUNTER — Ambulatory Visit (INDEPENDENT_AMBULATORY_CARE_PROVIDER_SITE_OTHER): Payer: BC Managed Care – PPO | Admitting: Internal Medicine

## 2018-09-28 VITALS — BP 142/79 | HR 80 | Temp 98.0°F | Ht 62.0 in | Wt 174.0 lb

## 2018-09-28 DIAGNOSIS — Z23 Encounter for immunization: Secondary | ICD-10-CM | POA: Diagnosis not present

## 2018-09-28 DIAGNOSIS — B20 Human immunodeficiency virus [HIV] disease: Secondary | ICD-10-CM | POA: Diagnosis not present

## 2018-09-28 MED ORDER — BICTEGRAVIR-EMTRICITAB-TENOFOV 50-200-25 MG PO TABS: 1.0000 | ORAL_TABLET | Freq: Every day | ORAL | 11 refills | Status: DC

## 2018-09-28 NOTE — Addendum Note (Signed)
Addended by: Eugenia Mcalpine on: 09/28/2018 02:12 PM   Modules accepted: Orders

## 2018-09-28 NOTE — Assessment & Plan Note (Signed)
Stephanie Espinoza has never had any complications related to her infection.  She has had problems with side effects of older regimens.  Her viral load has always been relatively low and was actually undetectable on her most recent blood work.  Her CD4 count is just below normal at 390.  She is interested in starting [place] now.  She will follow-up in 4 weeks.

## 2018-09-28 NOTE — Progress Notes (Signed)
Patient received Flu vaccine in Sept 2019 from employer. Patient received prevnar -13 today, tolerate well.

## 2018-09-28 NOTE — Progress Notes (Signed)
Patient Active Problem List   Diagnosis Date Noted  . Human immunodeficiency virus (HIV) disease ([place]) 09/06/2006    Priority: High  . GENITAL HERPES 09/06/2006    Priority: Medium  . Headache 06/30/2018  . Urinary frequency 05/01/2018  . Salivary gland adenitis 02/18/2018  . Sciatica 05/26/2017  . 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 Low back pain 06/12/2016  . Essential hypertension 09/16/2014  . 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 Sinusitis 03/22/2009  . SYPHILIS NOS 09/06/2006  . Depression 09/06/2006  . BELL'S PALSY 09/06/2006  . PAP SMEAR, ABNORMAL 09/06/2006    Patient's Medications  New Prescriptions   BICTEGRAVIR-EMTRICITABINE-TENOFOVIR AF (BIKTARVY) 50-200-25 MG TABS TABLET    Take 1 tablet by mouth daily.  Previous Medications   ACYCLOVIR OINTMENT (ZOVIRAX) 5 %    Apply 1 application topically every 3 (three) hours.   ALBUTEROL (PROVENTIL HFA;VENTOLIN HFA) 108 (90 BASE) MCG/ACT INHALER    Inhale 2 puffs into the lungs every 6 (six) hours as needed for wheezing or shortness of breath.   AMOXICILLIN-CLAVULANATE (AUGMENTIN) 875-125 MG TABLET    Take 1 tablet by mouth 2 (two) times daily.   ESTRADIOL (ESTRACE) 0.1 MG/GM VAGINAL CREAM    1 gram vaginally twice weekly   FLUTICASONE (FLONASE) 50 MCG/ACT NASAL SPRAY       VALACYCLOVIR (VALTREX) 500 MG TABLET    For 5 -10 days, as needed for outbreaks.  Modified Medications   No medications on file  Discontinued Medications   No medications on file    Subjective: Stephanie Espinoza is in for her routine HIV follow-up visit.  She has had several sinus infections over the past year and was told that this may be related to the fact that she is not on treatment for her infection.  She says she has now willing to try [place] which we talked about last year.     Review of Systems: Review of Systems  Constitutional: Negative for chills, diaphoresis, fever and weight loss.  HENT: Negative  for congestion and sore throat.   Respiratory: Negative for cough.   Cardiovascular: Negative for chest pain.  Gastrointestinal: Negative for abdominal pain, diarrhea, nausea and vomiting.  Skin: Negative for rash.    Past Medical History:  Diagnosis Date  . Abnormal Pap smear of cervix before 1995   always a repeat back to normal except for 1 time and had colpo bioosy -resutls were normal  . Allergic rhinitis   . Anxiety   . Bell's palsy after 1995 ?   no residual SE.  [name] Depression   . Genital herpes   . History of drug abuse ([place])    drug usser [place].  cocaine  . HIV (human immunodeficiency virus infection) ([place]) 1990   from sexual contact who is unknown  . Syphilis     Social History   Tobacco Use  . Smoking status: Former Smoker    Types: Cigarettes    Last attempt to quit: 03/01/1990    Years since quitting: 28.5  . Smokeless tobacco: Never Used  Substance Use Topics  . Alcohol use: No  . Drug use: No    Family History  Problem Relation Age of Onset  . Diabetes Father   . Breast cancer Other     Allergies  Allergen Reactions  . Haldol [Haloperidol] Other (See Comments)    Wipes out her memory  . Nevirapine  REACTION: Rash 10/08    Health Maintenance  Topic Date Due  . INFLUENZA VACCINE  02/03/2019 (Originally 06/04/2018)  . MAMMOGRAM  09/05/2019  . PAP SMEAR  02/06/2021  . Fecal DNA (Cologuard)  02/07/2021  . TETANUS/TDAP  10/10/2026  . Hepatitis C Screening  Completed  . HIV Screening  Completed    Objective:  Vitals:   09/28/18 1340  Weight: 174 lb (78.9 kg)  Height: 5\' 2"  (1.575 m)   Body mass index is 31.83 kg/m.  Physical Exam  Constitutional: She is oriented to person, place, and time.  She is very pleasant and in good spirits as usual.  HENT:  Mouth/Throat: No oropharyngeal exudate.  Cardiovascular: Normal rate, regular rhythm and normal heart sounds.  Pulmonary/Chest: Effort normal and breath sounds normal.    Neurological: She is alert and oriented to person, place, and time.  Skin: No rash noted.  Psychiatric: She has a normal mood and affect.    Lab Results Lab Results  Component Value Date   WBC 5.0 09/10/2018   HGB 11.7 09/10/2018   HCT 36.2 09/10/2018   MCV 85.2 09/10/2018   PLT 225 09/10/2018    Lab Results  Component Value Date   CREATININE 0.97 09/10/2018   BUN 13 09/10/2018   NA 140 09/10/2018   K 3.8 09/10/2018   CL 106 09/10/2018   CO2 27 09/10/2018    Lab Results  Component Value Date   ALT 16 09/10/2018   AST 19 09/10/2018   ALKPHOS 98 06/30/2018   BILITOT 0.5 09/10/2018    Lab Results  Component Value Date   CHOL 191 09/10/2018   HDL 43 (L) 09/10/2018   LDLCALC 115 (H) 09/10/2018   LDLDIRECT 102.1 03/02/2009   TRIG 221 (H) 09/10/2018   CHOLHDL 4.4 09/10/2018   Lab Results  Component Value Date   LABRPR NON-REACTIVE 09/10/2018   HIV 1 RNA Quant  Date Value  09/10/2018 <20 DETECTED copies/mL (A)  06/30/2018 2,250 Copies/mL (H)  02/18/2018 1,850 copies/mL (H)   CD4 T Cell Abs (/uL)  Date Value  09/10/2018 390 (L)  07/16/2017 590  05/23/2017 560     Problem List Items Addressed This Visit      High   Human immunodeficiency virus (HIV) disease ([place])    Stephanie Espinoza has never had any complications related to her infection.  She has had problems with side effects of older regimens.  Her viral load has always been relatively low and was actually undetectable on her most recent blood work.  Her CD4 count is just below normal at 390.  She is interested in starting [place] now.  She will follow-up in 4 weeks.      Relevant Medications   bictegravir-emtricitabine-tenofovir AF (BIKTARVY) 50-200-25 MG TABS tablet        Michel Bickers, MD [facility] for [place] [phone] pager   [phone] cell 09/28/2018, 2:02 PM

## 2018-10-16 ENCOUNTER — Other Ambulatory Visit: Payer: Self-pay | Admitting: Family Medicine

## 2018-10-22 ENCOUNTER — Ambulatory Visit: Payer: BC Managed Care – PPO | Admitting: Internal Medicine

## 2018-11-02 ENCOUNTER — Encounter: Payer: Self-pay | Admitting: Obstetrics & Gynecology

## 2018-11-02 ENCOUNTER — Other Ambulatory Visit: Payer: Self-pay | Admitting: Obstetrics & Gynecology

## 2018-11-02 NOTE — Telephone Encounter (Signed)
Medication refill request: Valtrex  500mg  Last AEX:  02/06/18 Next AEX: 04/23/19 Last MMG (if hormonal medication request): 09/04/17 Bi-rads 1 Neg  Refill authorized: #[id number] with 2RF

## 2018-11-02 NOTE — Telephone Encounter (Signed)
Hi I need a new prescription for valacyclovir, please let me know when I can pick it up, outbreaks are happening

## 2018-11-03 MED ORDER — VALACYCLOVIR HCL 500 MG PO TABS: ORAL_TABLET | ORAL | 2 refills | Status: DC

## 2018-11-05 ENCOUNTER — Encounter: Payer: Self-pay | Admitting: Obstetrics & Gynecology

## 2018-11-06 MED ORDER — VALACYCLOVIR HCL 500 MG PO TABS: ORAL_TABLET | ORAL | 2 refills | Status: DC

## 2018-12-22 ENCOUNTER — Telehealth: Payer: Self-pay

## 2018-12-22 NOTE — Telephone Encounter (Signed)
Patient called office to reschedule appointment with Dr. Megan Salon. Patient states she has an even scheduled that same day and is unable to to make for her appointment. Patient's appointment has been rescheduled for 3/5. [place]

## 2018-12-30 ENCOUNTER — Ambulatory Visit: Payer: BC Managed Care – PPO | Admitting: Internal Medicine

## 2019-01-07 ENCOUNTER — Ambulatory Visit: Payer: BC Managed Care – PPO | Admitting: Internal Medicine

## 2019-01-07 ENCOUNTER — Encounter: Payer: Self-pay | Admitting: Internal Medicine

## 2019-01-07 DIAGNOSIS — B20 Human immunodeficiency virus [HIV] disease: Secondary | ICD-10-CM

## 2019-01-07 NOTE — Assessment & Plan Note (Signed)
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  Her viral load was less than 20 in early November before she started back on antiretroviral therapy with Biktarvy.  She has never been able to stay on any antiretroviral regimen for very long.  She has a fairly strong belief that the medications cause adverse effects for her.  She elects to stay off of treatment for now.  I will get repeat blood work today and see her back in 6 months.

## 2019-01-07 NOTE — Progress Notes (Signed)
Patient Active Problem List   Diagnosis Date Noted  . Human immunodeficiency virus (HIV) disease ([place]) 09/06/2006    Priority: High  . GENITAL HERPES 09/06/2006    Priority: Medium  . Headache 06/30/2018  . Urinary frequency 05/01/2018  . Salivary gland adenitis 02/18/2018  . Sciatica 05/26/2017  . 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 Low back pain 06/12/2016  . Essential hypertension 09/16/2014  . 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 Sinusitis 03/22/2009  . SYPHILIS NOS 09/06/2006  . Depression 09/06/2006  . BELL'S PALSY 09/06/2006  . PAP SMEAR, ABNORMAL 09/06/2006    Patient's Medications  New Prescriptions   No medications on file  Previous Medications   ACYCLOVIR OINTMENT (ZOVIRAX) 5 %    Apply 1 application topically every 3 (three) hours.   ALBUTEROL (PROVENTIL HFA;VENTOLIN HFA) 108 (90 BASE) MCG/ACT INHALER    Inhale 2 puffs into the lungs every 6 (six) hours as needed for wheezing or shortness of breath.   AMOXICILLIN-CLAVULANATE (AUGMENTIN) 875-125 MG TABLET    Take 1 tablet by mouth 2 (two) times daily.   CYCLOBENZAPRINE (FLEXERIL) 10 MG TABLET    TAKE 1 TABLET BY MOUTH THREE TIMES A DAY AS NEEDED FOR MUSCLE SPASMS   ESTRADIOL (ESTRACE) 0.1 MG/GM VAGINAL CREAM    1 gram vaginally twice weekly   FLUTICASONE (FLONASE) 50 MCG/ACT NASAL SPRAY       VALACYCLOVIR (VALTREX) 500 MG TABLET    1 po q day and increase to bid x 3 days with symptoms.  Modified Medications   No medications on file  Discontinued Medications   BICTEGRAVIR-EMTRICITABINE-TENOFOVIR AF (BIKTARVY) 50-200-25 MG TABS TABLET    Take 1 tablet by mouth daily.    Subjective: Stephanie Espinoza is in for her routine HIV follow-up visit.  She started [place] after her visit last November.  Initially, she did not have any problems tolerating it and did not miss any doses.  However she began to have problems falling asleep and staying asleep.  She developed recurrent sciatica  causing some pain and tingling in her right thigh and she became very tired.  She started to gain weight.  She was concerned that all of this was related to [facility] and stopped it 2 weeks ago.  She noted that her sleep cycle returned to normal very promptly.  She also stopped gaining more weight.  She has been seeing someone for acupuncture and her sciatica seems to be getting somewhat better.  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  Review of Systems: Review of Systems  Constitutional: Positive for malaise/fatigue. Negative for chills, diaphoresis, fever and weight loss.  HENT: Negative for congestion and sore throat.   Respiratory: Negative for cough, sputum production and shortness of breath.   Cardiovascular: Negative for chest pain.  Gastrointestinal: Negative for abdominal pain, diarrhea, nausea and vomiting.  Genitourinary: Negative for dysuria.  Musculoskeletal: Positive for back pain.  Skin: Negative for rash.  Neurological: Negative for headaches.  Psychiatric/Behavioral: Negative for depression. The patient has insomnia.     Past Medical History:  Diagnosis Date  . Abnormal Pap smear of cervix before 1995   always a repeat back to normal except for 1 time and had colpo bioosy -resutls were normal  . Allergic rhinitis   . Anxiety   . Bell's palsy after 1995 ?   no residual SE.  [name] Depression   .  Genital herpes   . History of drug abuse ([place])    drug usser [place].  cocaine  . HIV (human immunodeficiency virus infection) ([place]) 1990   from sexual contact who is unknown  . Syphilis     Social History   Tobacco Use  . Smoking status: Former Smoker    Types: Cigarettes    Last attempt to quit: 03/01/1990    Years since quitting: 28.8  . Smokeless tobacco: Never Used  Substance Use Topics  . Alcohol use: No  . Drug use: No    Family History  Problem Relation Age of Onset  . Diabetes Father   . Breast  cancer Other     Allergies  Allergen Reactions  . Haldol [Haloperidol] Other (See Comments)    Wipes out her memory  . Nevirapine     REACTION: Rash 10/08    Health Maintenance  Topic Date Due  . INFLUENZA VACCINE  02/03/2019 (Originally 06/04/2018)  . MAMMOGRAM  09/05/2019  . PAP SMEAR-Modifier  02/06/2021  . Fecal DNA (Cologuard)  02/07/2021  . TETANUS/TDAP  10/10/2026  . Hepatitis C Screening  Completed  . HIV Screening  Completed    Objective:  Vitals:   01/07/19 0821  BP: (!) 157/83  Pulse: 71  Temp: 98.4 F (36.9 C)  TempSrc: Oral  Weight: 184 lb (83.5 kg)   Body mass index is 33.65 kg/m.  Physical Exam Constitutional:      Comments: She is talkative and in good spirits.  Her weight is up 18 pounds since her last visit.  HENT:     Mouth/Throat:     Pharynx: No oropharyngeal exudate.  Cardiovascular:     Rate and Rhythm: Normal rate and regular rhythm.     Heart sounds: No murmur.  Pulmonary:     Effort: Pulmonary effort is normal.     Breath sounds: Normal breath sounds.  Abdominal:     Palpations: Abdomen is soft.     Tenderness: There is no abdominal tenderness.  Psychiatric:        Mood and Affect: Mood normal.     Lab Results Lab Results  Component Value Date   WBC 5.0 09/10/2018   HGB 11.7 09/10/2018   HCT 36.2 09/10/2018   MCV 85.2 09/10/2018   PLT 225 09/10/2018    Lab Results  Component Value Date   CREATININE 0.97 09/10/2018   BUN 13 09/10/2018   NA 140 09/10/2018   K 3.8 09/10/2018   CL 106 09/10/2018   CO2 27 09/10/2018    Lab Results  Component Value Date   ALT 16 09/10/2018   AST 19 09/10/2018   ALKPHOS 98 06/30/2018   BILITOT 0.5 09/10/2018    Lab Results  Component Value Date   CHOL 191 09/10/2018   HDL 43 (L) 09/10/2018   LDLCALC 115 (H) 09/10/2018   LDLDIRECT 102.1 03/02/2009   TRIG 221 (H) 09/10/2018   CHOLHDL 4.4 09/10/2018   Lab Results  Component Value Date   LABRPR NON-REACTIVE 09/10/2018   HIV 1  RNA Quant  Date Value  09/10/2018 <20 DETECTED copies/mL (A)  06/30/2018 2,250 Copies/mL (H)  02/18/2018 1,850 copies/mL (H)   CD4 T Cell Abs (/uL)  Date Value  09/10/2018 390 (L)  07/16/2017 590  05/23/2017 560     Problem List Items Addressed This Visit      High   Human immunodeficiency virus (HIV) disease ([place])    Tyrena is probably a longer  term nonprogressive her.  Her viral load was less than 20 in early November before she started back on antiretroviral therapy with Biktarvy.  She has never been able to stay on any antiretroviral regimen for very long.  She has a fairly strong belief that the medications cause adverse effects for her.  She elects to stay off of treatment for now.  I will get repeat blood work today and see her back in 6 months.      Relevant Orders   T-helper cell (CD4)- ([facility] clinic only)   HIV-1 RNA quant-no reflex-bld   CBC   Comprehensive metabolic panel   RPR   Lipid panel        Michel Bickers, MD [facility] for [place] [phone] pager   [phone] cell 01/07/2019, 8:37 AM

## 2019-01-08 LAB — T-HELPER CELL (CD4) - (RCID CLINIC ONLY)
CD4 % Helper T Cell: 12 % — ABNORMAL LOW (ref 33–55)
CD4 T Cell Abs: 230 /uL — ABNORMAL LOW (ref 400–2700)

## 2019-01-11 LAB — COMPREHENSIVE METABOLIC PANEL WITH GFR
AG Ratio: 1 (calc) (ref 1.0–2.5)
ALT: 13 U/L (ref 6–29)
AST: 18 U/L (ref 10–35)
Albumin: 3.7 g/dL (ref 3.6–5.1)
Alkaline phosphatase (APISO): 81 U/L (ref 37–153)
BUN/Creatinine Ratio: 14 (calc) (ref 6–22)
BUN: 16 mg/dL (ref 7–25)
CO2: 29 mmol/L (ref 20–32)
Calcium: 9.5 mg/dL (ref 8.6–10.4)
Chloride: 106 mmol/L (ref 98–110)
Creat: 1.11 mg/dL — ABNORMAL HIGH (ref 0.50–0.99)
Globulin: 3.6 g/dL (ref 1.9–3.7)
Glucose, Bld: 95 mg/dL (ref 65–99)
Potassium: 4 mmol/L (ref 3.5–5.3)
Sodium: 140 mmol/L (ref 135–146)
Total Bilirubin: 0.8 mg/dL (ref 0.2–1.2)
Total Protein: 7.3 g/dL (ref 6.1–8.1)

## 2019-01-11 LAB — CBC
HCT: 36.6 % (ref 35.0–45.0)
Hemoglobin: 11.9 g/dL (ref 11.7–15.5)
MCH: 28.4 pg (ref 27.0–33.0)
MCHC: 32.5 g/dL (ref 32.0–36.0)
MCV: 87.4 fL (ref 80.0–100.0)
MPV: 10 fL (ref 7.5–12.5)
Platelets: 221 10*3/uL (ref 140–400)
RBC: 4.19 10*6/uL (ref 3.80–5.10)
RDW: 12.8 % (ref 11.0–15.0)
WBC: 3.7 10*3/uL — ABNORMAL LOW (ref 3.8–10.8)

## 2019-01-11 LAB — LIPID PANEL
Cholesterol: 194 mg/dL (ref <200)
HDL: 47 mg/dL — ABNORMAL LOW (ref >=50)
LDL Cholesterol (Calc): 115 mg/dL (calc) — ABNORMAL HIGH
Non-HDL Cholesterol (Calc): 147 mg/dL (calc) — ABNORMAL HIGH (ref <130)
Total CHOL/HDL Ratio: 4.1 (calc) (ref <5.0)
Triglycerides: 200 mg/dL — ABNORMAL HIGH (ref <150)

## 2019-01-11 LAB — HIV-1 RNA QUANT-NO REFLEX-BLD
HIV 1 RNA Quant: <20 {copies}/mL
HIV-1 RNA Quant, Log: <1.3 {Log_copies}/mL

## 2019-01-11 LAB — RPR: RPR Ser Ql: NONREACTIVE

## 2019-01-21 ENCOUNTER — Encounter: Payer: Self-pay | Admitting: Obstetrics & Gynecology

## 2019-01-21 ENCOUNTER — Ambulatory Visit: Payer: BC Managed Care – PPO | Admitting: Obstetrics & Gynecology

## 2019-01-21 ENCOUNTER — Encounter: Payer: Self-pay | Admitting: Internal Medicine

## 2019-01-21 ENCOUNTER — Other Ambulatory Visit: Payer: Self-pay

## 2019-01-21 VITALS — BP 110/72 | HR 68 | Temp 98.3°F | Resp 16 | Wt 182.0 lb

## 2019-01-21 DIAGNOSIS — N390 Urinary tract infection, site not specified: Secondary | ICD-10-CM

## 2019-01-21 DIAGNOSIS — R3915 Urgency of urination: Secondary | ICD-10-CM | POA: Diagnosis not present

## 2019-01-21 DIAGNOSIS — N369 Urethral disorder, unspecified: Secondary | ICD-10-CM | POA: Diagnosis not present

## 2019-01-21 LAB — POCT URINALYSIS DIPSTICK
Bilirubin, UA: NEGATIVE
Blood, UA: NEGATIVE
Glucose, UA: NEGATIVE
Ketones, UA: NEGATIVE
Nitrite, UA: NEGATIVE
Protein, UA: NEGATIVE
Urobilinogen, UA: NEGATIVE E.U./dL — AB
pH, UA: 5 (ref 5.0–8.0)

## 2019-01-21 MED ORDER — SULFAMETHOXAZOLE-TRIMETHOPRIM 800-160 MG PO TABS: 1.0000 | ORAL_TABLET | Freq: Two times a day (BID) | ORAL | 0 refills | Status: DC

## 2019-01-21 NOTE — Progress Notes (Signed)
GYNECOLOGY  VISIT  CC:   Urinary urgency  HPI: 63 y.o. [id number] Divorced AAF with four day hx of urinary urgency.  She's having nocturia.  Reports she's having lower pelvic tenderness.  Last night she felt a little fever-ish but she took her temp and it was normal.  She denies hematuria.  Denies new back pain.  Drank soda before all these symptoms started and she felt the urinary symptoms were related to this.  However, it did not clear up and that is why she decided to come in today for evaluation.    GYNECOLOGIC HISTORY: Patient's last menstrual period was 11/04/1997. Contraception: post menopausal & condoms Menopausal hormone therapy: estrace vaginal cream  Patient Active Problem List   Diagnosis Date Noted  . Headache 06/30/2018  . Urinary frequency 05/01/2018  . Salivary gland adenitis 02/18/2018  . Sciatica 05/26/2017  . 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 Low back pain 06/12/2016  . Essential hypertension 09/16/2014  . 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 Sinusitis 03/22/2009  . Human immunodeficiency virus (HIV) disease ([place]) 09/06/2006  . GENITAL HERPES 09/06/2006  . SYPHILIS NOS 09/06/2006  . Depression 09/06/2006  . BELL'S PALSY 09/06/2006  . PAP SMEAR, ABNORMAL 09/06/2006    Past Medical History:  Diagnosis Date  . Abnormal Pap smear of cervix before 1995   always a repeat back to normal except for 1 time and had colpo bioosy -resutls were normal  . Allergic rhinitis   . Anxiety   . Bell's palsy after 1995 ?   no residual SE.  [name] Depression   . Genital herpes   . History of drug abuse ([place])    drug usser [place].  cocaine  . HIV (human immunodeficiency virus infection) ([place]) 1990   from sexual contact who is unknown  . Syphilis     Past Surgical History:  Procedure Laterality Date  . CESAREAN SECTION  1997    MEDS:   Current Outpatient Medications on File Prior to Visit  Medication Sig Dispense Refill  .  acyclovir ointment (ZOVIRAX) 5 % Apply 1 application topically every 3 (three) hours. 15 g 2  . albuterol (PROVENTIL HFA;VENTOLIN HFA) 108 (90 Base) MCG/ACT inhaler Inhale 2 puffs into the lungs every 6 (six) hours as needed for wheezing or shortness of breath. 1 Inhaler 2  . amoxicillin-clavulanate (AUGMENTIN) 875-125 MG tablet Take 1 tablet by mouth 2 (two) times daily. (Patient not taking: Reported on 01/07/2019) 20 tablet 0  . cyclobenzaprine (FLEXERIL) 10 MG tablet TAKE 1 TABLET BY MOUTH THREE TIMES A DAY AS NEEDED FOR MUSCLE SPASMS 45 tablet 0  . estradiol (ESTRACE) 0.1 MG/GM vaginal cream 1 gram vaginally twice weekly 42.5 g 2  . fluticasone (FLONASE) 50 MCG/ACT nasal spray     . valACYclovir (VALTREX) 500 MG tablet 1 po q day and increase to bid x 3 days with symptoms. 90 tablet 2   No current facility-administered medications on file prior to visit.     ALLERGIES: Haldol [haloperidol] and Nevirapine  Family History  Problem Relation Age of Onset  . Diabetes Father   . Breast cancer Other     SH:  Divorced, former smoker  Review of Systems  Constitutional: Negative.   HENT: Negative.   Eyes: Negative.   Respiratory: Negative.   Cardiovascular: Negative.   Gastrointestinal: Negative.   Endocrine: Negative.   Genitourinary: Positive for frequency and urgency.       Nausea, vomiting, abdominal  pain  Musculoskeletal: Negative.   Allergic/Immunologic: Negative.   Neurological: Negative.   Hematological: Negative.   Psychiatric/Behavioral: Negative.     PHYSICAL EXAMINATION:   poct urine-wbc tr  LMP 11/04/1997     General appearance: alert, cooperative and appears stated age Lymph:  no inguinal LAD noted  Pelvic: External genitalia:  no lesions              Urethra:  Three tiny ulcerations on the urethral c/w HSV lesions              Bartholins and Skenes: normal                 Vagina: normal appearing vagina with normal color and discharge, no lesions               Cervix: no lesions              Bimanual Exam:  Uterus:  normal size, contour, position, consistency, mobility, non-tender              Adnexa: no mass, fullness, tenderness  Chaperone was present for exam.  Assessment: Urinary urgency Probable periurethral HSV lesions H/O HIV  Plan: Will check urine culture and treat with bactrim DS bid x 3 days She will increase her valtrex to the recurrent outbreak dosage as well--BID x 3 days

## 2019-01-22 LAB — URINE CULTURE

## 2019-01-24 ENCOUNTER — Encounter: Payer: Self-pay | Admitting: Obstetrics & Gynecology

## 2019-01-26 ENCOUNTER — Telehealth: Payer: Self-pay | Admitting: *Deleted

## 2019-01-26 NOTE — Telephone Encounter (Signed)
Patient notified

## 2019-01-26 NOTE — Telephone Encounter (Signed)
-----   Message from Megan Salon, MD sent at 01/24/2019  6:57 AM EDT ----- Please let pt know her urine culture was negative.  She did have three tiny HSV appearing lesions on the urethra.  If symptoms resolved, ok to just monitor.  If symptoms return, I will want her to come back for recheck.  Thanks.

## 2019-01-26 NOTE — Telephone Encounter (Signed)
Patient is returning a call to Reina. °

## 2019-01-26 NOTE — Telephone Encounter (Signed)
LM for pt to call back.

## 2019-02-28 ENCOUNTER — Other Ambulatory Visit: Payer: Self-pay

## 2019-02-28 ENCOUNTER — Emergency Department (HOSPITAL_COMMUNITY): Payer: BC Managed Care – PPO

## 2019-02-28 ENCOUNTER — Inpatient Hospital Stay (HOSPITAL_COMMUNITY)
Admission: EM | Admit: 2019-02-28 | Discharge: 2019-03-03 | DRG: 418 | Disposition: A | Discharge status: Home / Self Care | Payer: BC Managed Care – PPO | Attending: Internal Medicine | Admitting: Internal Medicine

## 2019-02-28 ENCOUNTER — Encounter (HOSPITAL_COMMUNITY): Payer: Self-pay | Admitting: *Deleted

## 2019-02-28 DIAGNOSIS — R1011 Right upper quadrant pain: Secondary | ICD-10-CM | POA: Diagnosis not present

## 2019-02-28 DIAGNOSIS — Z888 Allergy status to other drugs, medicaments and biological substances status: Secondary | ICD-10-CM

## 2019-02-28 DIAGNOSIS — R109 Unspecified abdominal pain: Secondary | ICD-10-CM

## 2019-02-28 DIAGNOSIS — R748 Abnormal levels of other serum enzymes: Secondary | ICD-10-CM | POA: Diagnosis not present

## 2019-02-28 DIAGNOSIS — R945 Abnormal results of liver function studies: Secondary | ICD-10-CM

## 2019-02-28 DIAGNOSIS — E66811 Obesity, class 1: Secondary | ICD-10-CM

## 2019-02-28 DIAGNOSIS — R739 Hyperglycemia, unspecified: Secondary | ICD-10-CM

## 2019-02-28 DIAGNOSIS — J309 Allergic rhinitis, unspecified: Secondary | ICD-10-CM | POA: Diagnosis present

## 2019-02-28 DIAGNOSIS — Z87891 Personal history of nicotine dependence: Secondary | ICD-10-CM

## 2019-02-28 DIAGNOSIS — B20 Human immunodeficiency virus [HIV] disease: Secondary | ICD-10-CM | POA: Diagnosis present

## 2019-02-28 DIAGNOSIS — Z66 Do not resuscitate: Secondary | ICD-10-CM | POA: Diagnosis present

## 2019-02-28 DIAGNOSIS — E876 Hypokalemia: Secondary | ICD-10-CM | POA: Diagnosis present

## 2019-02-28 DIAGNOSIS — K8067 Calculus of gallbladder and bile duct with acute and chronic cholecystitis with obstruction: Secondary | ICD-10-CM | POA: Diagnosis not present

## 2019-02-28 DIAGNOSIS — D649 Anemia, unspecified: Secondary | ICD-10-CM

## 2019-02-28 DIAGNOSIS — Z6833 Body mass index (BMI) 33.0-33.9, adult: Secondary | ICD-10-CM

## 2019-02-28 DIAGNOSIS — R7989 Other specified abnormal findings of blood chemistry: Secondary | ICD-10-CM

## 2019-02-28 DIAGNOSIS — Z8619 Personal history of other infectious and parasitic diseases: Secondary | ICD-10-CM

## 2019-02-28 DIAGNOSIS — Z79899 Other long term (current) drug therapy: Secondary | ICD-10-CM

## 2019-02-28 DIAGNOSIS — E669 Obesity, unspecified: Secondary | ICD-10-CM | POA: Diagnosis present

## 2019-02-28 DIAGNOSIS — K812 Acute cholecystitis with chronic cholecystitis: Secondary | ICD-10-CM

## 2019-02-28 DIAGNOSIS — I10 Essential (primary) hypertension: Secondary | ICD-10-CM | POA: Diagnosis present

## 2019-02-28 DIAGNOSIS — K8050 Calculus of bile duct without cholangitis or cholecystitis without obstruction: Secondary | ICD-10-CM

## 2019-02-28 LAB — COMPREHENSIVE METABOLIC PANEL
ALT: 313 U/L — ABNORMAL HIGH (ref 0–44)
AST: 709 U/L — ABNORMAL HIGH (ref 15–41)
Albumin: 3.5 g/dL (ref 3.5–5.0)
Alkaline Phosphatase: 186 U/L — ABNORMAL HIGH (ref 38–126)
Anion gap: 8 (ref 5–15)
BUN: 12 mg/dL (ref 6–20)
CO2: 21 mmol/L — ABNORMAL LOW (ref 22–32)
Calcium: 8.6 mg/dL — ABNORMAL LOW (ref 8.9–10.3)
Chloride: 105 mmol/L (ref 98–111)
Creatinine, Ser: 0.97 mg/dL (ref 0.44–1.00)
GFR calc Af Amer: >60 mL/min (ref >60)
GFR calc non Af Amer: >60 mL/min (ref >60)
Glucose, Bld: 123 mg/dL — ABNORMAL HIGH (ref 70–99)
Potassium: 3.9 mmol/L (ref 3.5–5.1)
Sodium: 134 mmol/L — ABNORMAL LOW (ref 135–145)
Total Bilirubin: 1.3 mg/dL — ABNORMAL HIGH (ref 0.3–1.2)
Total Protein: 8 g/dL (ref 6.5–8.1)

## 2019-02-28 LAB — CBC WITH DIFFERENTIAL/PLATELET
Abs Immature Granulocytes: 0.02 10*3/uL (ref 0.00–0.07)
Basophils Absolute: 0 10*3/uL (ref 0.0–0.1)
Basophils Relative: 0 %
Eosinophils Absolute: 0 10*3/uL (ref 0.0–0.5)
Eosinophils Relative: 0 %
HCT: 37.8 % (ref 36.0–46.0)
Hemoglobin: 11.7 g/dL — ABNORMAL LOW (ref 12.0–15.0)
Immature Granulocytes: 0 %
Lymphocytes Relative: 27 %
Lymphs Abs: 1.8 10*3/uL (ref 0.7–4.0)
MCH: 27.9 pg (ref 26.0–34.0)
MCHC: 31 g/dL (ref 30.0–36.0)
MCV: 90.2 fL (ref 80.0–100.0)
Monocytes Absolute: 0.3 10*3/uL (ref 0.1–1.0)
Monocytes Relative: 5 %
Neutro Abs: 4.4 10*3/uL (ref 1.7–7.7)
Neutrophils Relative %: 68 %
Platelets: 201 10*3/uL (ref 150–400)
RBC: 4.19 MIL/uL (ref 3.87–5.11)
RDW: 12.9 % (ref 11.5–15.5)
WBC: 6.6 10*3/uL (ref 4.0–10.5)
nRBC: 0 % (ref 0.0–0.2)

## 2019-02-28 LAB — URINALYSIS, ROUTINE W REFLEX MICROSCOPIC
Bilirubin Urine: NEGATIVE
Glucose, UA: NEGATIVE mg/dL
Hgb urine dipstick: NEGATIVE
Ketones, ur: NEGATIVE mg/dL
Nitrite: NEGATIVE
Protein, ur: 30 mg/dL — AB
Specific Gravity, Urine: 1.018 (ref 1.005–1.030)
pH: 7 (ref 5.0–8.0)

## 2019-02-28 LAB — LIPASE, BLOOD: Lipase: 45 U/L (ref 11–51)

## 2019-02-28 MED ORDER — ENOXAPARIN SODIUM 40 MG/0.4ML ~~LOC~~ SOLN: 40.0000 mg | SUBCUTANEOUS | Status: DC

## 2019-02-28 MED ORDER — SENNA 8.6 MG PO TABS
1.0000 | ORAL_TABLET | Freq: Two times a day (BID) | ORAL | Status: DC
  Administered 2019-02-28: 8.6 mg via ORAL
  Filled 2019-02-28 (×2): qty 1

## 2019-02-28 MED ORDER — ONDANSETRON HCL 4 MG/2ML IJ SOLN
4.0000 mg | Freq: Four times a day (QID) | INTRAMUSCULAR | Status: DC | PRN
  Filled 2019-02-28: qty 2

## 2019-02-28 MED ORDER — MORPHINE SULFATE (PF) 4 MG/ML IV SOLN
4.0000 mg | Freq: Once | INTRAVENOUS | Status: AC
  Administered 2019-02-28: 4 mg via INTRAVENOUS
  Filled 2019-02-28: qty 1

## 2019-02-28 MED ORDER — IOHEXOL 300 MG/ML  SOLN
100.0000 mL | Freq: Once | INTRAMUSCULAR | Status: AC | PRN
  Administered 2019-02-28: 100 mL via INTRAVENOUS

## 2019-02-28 MED ORDER — SODIUM CHLORIDE (PF) 0.9 % IJ SOLN
INTRAMUSCULAR | Status: AC
  Filled 2019-02-28: qty 50

## 2019-02-28 MED ORDER — FLEET ENEMA 7-19 GM/118ML RE ENEM: 1.0000 | ENEMA | Freq: Once | RECTAL | Status: DC | PRN

## 2019-02-28 MED ORDER — SODIUM CHLORIDE 0.9 % IV SOLN
INTRAVENOUS | Status: DC
  Administered 2019-02-28 – 2019-03-01 (×2): via INTRAVENOUS

## 2019-02-28 MED ORDER — ACETAMINOPHEN 650 MG RE SUPP: 650.0000 mg | Freq: Four times a day (QID) | RECTAL | Status: DC | PRN

## 2019-02-28 MED ORDER — MAGNESIUM HYDROXIDE 400 MG/5ML PO SUSP: 30.0000 mL | Freq: Every day | ORAL | Status: DC | PRN

## 2019-02-28 MED ORDER — FENTANYL CITRATE (PF) 100 MCG/2ML IJ SOLN
100.0000 ug | Freq: Once | INTRAMUSCULAR | Status: AC
  Administered 2019-02-28: 100 ug via INTRAVENOUS
  Filled 2019-02-28: qty 2

## 2019-02-28 MED ORDER — OXYCODONE HCL 5 MG PO TABS
5.0000 mg | ORAL_TABLET | ORAL | Status: DC | PRN
  Administered 2019-02-28: 5 mg via ORAL
  Filled 2019-02-28: qty 1

## 2019-02-28 MED ORDER — ONDANSETRON HCL 4 MG PO TABS: 4.0000 mg | ORAL_TABLET | Freq: Four times a day (QID) | ORAL | Status: DC | PRN

## 2019-02-28 MED ORDER — SODIUM CHLORIDE 0.9 % IV BOLUS
1000.0000 mL | Freq: Once | INTRAVENOUS | Status: AC
  Administered 2019-02-28: 1000 mL via INTRAVENOUS

## 2019-02-28 MED ORDER — ACETAMINOPHEN 325 MG PO TABS
650.0000 mg | ORAL_TABLET | Freq: Four times a day (QID) | ORAL | Status: DC | PRN
  Administered 2019-03-01: 650 mg via ORAL
  Filled 2019-02-28 (×2): qty 2

## 2019-02-28 MED ORDER — SODIUM CHLORIDE 0.9% FLUSH
3.0000 mL | Freq: Two times a day (BID) | INTRAVENOUS | Status: DC
  Administered 2019-02-28: 3 mL via INTRAVENOUS

## 2019-02-28 MED ORDER — SORBITOL 70 % SOLN
30.0000 mL | Freq: Every day | Status: DC | PRN
  Filled 2019-02-28: qty 30

## 2019-02-28 NOTE — H&P (Signed)
[facility] History and Physical  Stephanie Espinoza UXY:[id number] DOB: 1956-02-28 DOA: 02/28/2019 Referring physician: ED PCP: Hoyt Koch, MD  Chief Complaint: Abdominal pain, nausea ------------------------------------------------------------------------------------------------------ Assessment/Plan: Active Problems:   Elevated liver enzymes  Abdominal pain/elevated liver enzymes/Colelithiasis -Right upper quadrant pain associated with nausea and elevated liver enzymes. -liver enzymes with alk phos/AST/ALT/total bili respectively elevated to 186/709/313/1.3. -Ultrasound showed cholelithiasis without evidence of acute cholecystitis.  No CBD dilation noted. -General surgery has been called from the ED and per ED physician, they recommended to get GI involved.  I left a message for GI consult.  Defer to them for any need of MRCP. -I ordered for acute hepatitis panel.  HIV -Patient states being HIV positive for last 30 years.  Not on any antiretroviral treatment.  Follows up with infectious disease on a regular basis  Hypertension -No other medicines at home.  Blood pressure mostly 150s and 160s at this time.  Monitor blood pressure trend.  Mobility: Encourage ambulation Diet: Clear liquid diet for now.  N.p.o. after midnight for potential procedure tomorrow DVT prophylaxis:  Avoid heparin or Lovenox because of potential need for procedure tomorrow.  SCD boots for now Code Status:  Full code Disposition Plan:  If no GI/surgical intervention planned, patient may end up going home tomorrow  ----------------------------------------------------------------------------------------------------- History of Present Illness: Stephanie Espinoza is a 63 y.o. female with HIV, hypertension who presented to an urgent care this morning with complaint of abdominal pain and nausea since 2 AM this morning.  Denies vomiting or diarrhea.  No fever.  Pain localized right upper quadrant, no relieving  factor.  Worsens with certain movements and palpation.  She was sent to the ED from urgent care. In the ED, patient was afebrile, heart rate in 60s and 70s, breathing comfortably on room air, blood pressure elevated mostly up to 167/95. Labs showed WBC count normal at 6.6, hemoglobin 11.7, platelet 201, sodium level slightly low at 134, potassium 3.9, BUN/creatinine 12/0.97, liver enzymes with alk phos/AST/ALT/total bili respectively elevated to 186/709/313/1.3.  CT abdomen pelvis showed mild gallbladder wall thickening without evidence of calcified gallstones or pericholecystic edema/inflammation suggestive acalculous cholecystitis or chronic cholecystitis. Ultrasound abdomen pelvis showed cholelithiasis without sonographic features of acute cholecystitis.   Review of Systems:  All systems were reviewed and were negative unless otherwise mentioned in the HPI.  Past medical history: Past Medical History:  Diagnosis Date   Abnormal Pap smear of cervix before 1995   always a repeat back to normal except for 1 time and had colpo bioosy -resutls were normal   Allergic rhinitis    Anxiety    Bell's palsy after 1995 ?   no residual SE.   Depression    Genital herpes    History of drug abuse ([place])    drug usser [place].  cocaine   HIV (human immunodeficiency virus infection) ([place]) 1990   from sexual contact who is unknown   Syphilis    treated    Past surgical history: Past Surgical History:  Procedure Laterality Date   [place]    Social History:  reports that she quit smoking about 29 years ago. Her smoking use included cigarettes. She has never used smokeless tobacco. She reports that she does not drink alcohol or use drugs.  Allergies:  Allergies  Allergen Reactions   Haldol [Haloperidol] Other (See Comments)    Wipes out her memory   Nevirapine     REACTION: Rash 10/08  Thorazine [Chlorpromazine] Other (See Comments)    "makes me comatose"      Family history:  Family History  Problem Relation Age of Onset   Diabetes Father    Breast cancer Other      Home Meds: Prior to Admission medications   Medication Sig Start Date End Date Taking? Authorizing Provider  albuterol (PROVENTIL HFA;VENTOLIN HFA) 108 (90 Base) MCG/ACT inhaler Inhale 2 puffs into the lungs every 6 (six) hours as needed for wheezing or shortness of breath. 05/27/16  Yes Hoyt Koch, MD  valACYclovir (VALTREX) 500 MG tablet 1 po q day and increase to bid x 3 days with symptoms. 11/06/18  Yes Megan Salon, MD    Physical Exam: Vitals:   02/28/19 1100 02/28/19 1200 02/28/19 1300 02/28/19 1430  BP: (!) 150/82 (!) 163/90 (!) 147/90 (!) 157/101  Pulse: 68 70 73 74  Resp:      Temp:      TempSrc:      SpO2: 95% 98% 93% 95%   Wt Readings from Last 3 Encounters:  01/21/19 82.6 kg  01/07/19 83.5 kg  09/28/18 78.9 kg   There is no height or weight on file to calculate BMI.   General exam: Appears calm and comfortable.  Lying down in bed Skin: No rashes, lesions or ulcers. HEENT: Normal Lungs: Clear to auscultation bilaterally CVS: Regular rate and rhythm, no murmur GI/Abd soft, nondistended, mild tenderness in the right upper quadrant, bowel sound present CNS: Alert, awake, oriented x3 Psychiatry: Mood & affect appropriate.  Extremities: No pedal edema, no calf tenderness  Labs on Admission:   CBC: Recent Labs  Lab 02/28/19 0951  WBC 6.6  NEUTROABS 4.4  HGB 11.7*  HCT 37.8  MCV 90.2  PLT 384    Basic Metabolic Panel: Recent Labs  Lab 02/28/19 0951  NA 134*  K 3.9  CL 105  CO2 21*  GLUCOSE 123*  BUN 12  CREATININE 0.97  CALCIUM 8.6*    Liver Function Tests: Recent Labs  Lab 02/28/19 0951  AST 709*  ALT 313*  ALKPHOS 186*  BILITOT 1.3*  PROT 8.0  ALBUMIN 3.5   Recent Labs  Lab 02/28/19 0951  LIPASE 45   No results for input(s): AMMONIA in the last 168 hours.  Cardiac Enzymes: No results for input(s):  CKTOTAL, CKMB, CKMBINDEX, TROPONINI in the last 168 hours.  BNP (last 3 results) No results for input(s): BNP in the last 8760 hours.  ProBNP (last 3 results) No results for input(s): PROBNP in the last 8760 hours.  CBG: No results for input(s): GLUCAP in the last 168 hours.  Lipase     Component Value Date/Time   LIPASE 45 02/28/2019 0951     Urinalysis    Component Value Date/Time   COLORURINE YELLOW 02/28/2019 0952   APPEARANCEUR HAZY (A) 02/28/2019 0952   LABSPEC 1.018 02/28/2019 0952   PHURINE 7.0 02/28/2019 0952   GLUCOSEU NEGATIVE 02/28/2019 0952   GLUCOSEU NEGATIVE 01/27/2018 0931   HGBUR NEGATIVE 02/28/2019 0952   BILIRUBINUR NEGATIVE 02/28/2019 0952   BILIRUBINUR n 01/21/2019 [place] 02/28/2019 0952   PROTEINUR 30 (A) 02/28/2019 0952   UROBILINOGEN negative (A) 01/21/2019 1414   UROBILINOGEN 0.2 01/27/2018 0931   NITRITE NEGATIVE 02/28/2019 0952   LEUKOCYTESUR LARGE (A) 02/28/2019 0952     Drugs of Abuse  No results found for: LABOPIA, COCAINSCRNUR, LABBENZ, AMPHETMU, THCU, LABBARB    Radiological Exams on Admission: Ct Abdomen Pelvis  W Contrast  Result Date: 02/28/2019 CLINICAL DATA:  63 year old with acute onset generalized abdominal pain and nausea that began at 2 o'clock this morning. Current history of treated HIV. Surgical history includes cesarean section. EXAM: CT ABDOMEN AND PELVIS WITH CONTRAST TECHNIQUE: Multidetector CT imaging of the abdomen and pelvis was performed using the standard protocol following bolus administration of intravenous contrast. CONTRAST:  133m OMNIPAQUE IOHEXOL 300 MG/ML IV. COMPARISON:  None. FINDINGS: Lower chest: Mild atelectasis and/or scarring involving the BILATERAL lower lobes, LEFT greater than RIGHT, and the lingula. Heart size upper normal. Hepatobiliary: Liver normal in size and appearance. Mild gallbladder wall thickening. No calcified gallstones. No convincing pericholecystic edema/inflammation. No  biliary ductal dilation. Pancreas: Normal in appearance without evidence of mass, ductal dilation, or inflammation. Spleen: Normal in size and appearance. Adrenals/Urinary Tract: Normal appearing adrenal glands. Subcentimeter benign cortical cyst involving the LOWER pole the RIGHT kidney. No significant parenchymal abnormalities involving either kidney. No hydronephrosis. No urinary tract calculi. Normal appearing urinary bladder. Stomach/Bowel: Gas-filled diverticulum arising from the distal esophagus near the EG junction. Stomach normal in appearance for the degree of distention. Normal-appearing small bowel. Extensive diffuse colonic diverticulosis without evidence of acute diverticulitis. Normal appendix in the RIGHT UPPER pelvis. Vascular/Lymphatic: Mild aortoiliac atherosclerosis without evidence of aneurysm. Normal-appearing portal venous and systemic venous systems. No pathologic lymphadenopathy. Numerous normal sized lymph nodes throughout the mesentery and retroperitoneum. Reproductive: Normal-appearing uterus and ovaries without evidence of adnexal mass. Other: None. Musculoskeletal: Broad-based central disc protrusion at L5-S1 and facet degenerative changes at this level. No acute findings. IMPRESSION: 1. Mild gallbladder wall thickening without evidence of calcified gallstones or pericholecystic edema/inflammation. Acalculous cholecystitis or chronic cholecystitis might have this appearance. RIGHT UPPER QUADRANT abdominal ultrasound may be helpful to confirm or deny this finding. 2. No acute abnormalities otherwise involving the abdomen or pelvis. 3. Diffuse colonic diverticulosis without evidence of acute diverticulitis. 4. Gas-filled diverticulum arising from the distal esophagus near the EG junction. Aortic Atherosclerosis (ICD10-170.0) Electronically Signed   By: TEvangeline DakinM.D.   On: 02/28/2019 13:38   UKoreaAbdomen Limited Ruq  Result Date: 02/28/2019 CLINICAL DATA:  RIGHT upper quadrant  pain. EXAM: ULTRASOUND ABDOMEN LIMITED RIGHT UPPER QUADRANT COMPARISON:  None. FINDINGS: Gallbladder: Multiple shadowing gallstones and sludge. Difficult to measure a discrete calculus single dimension. Wall thickness 2.0 mm. Negative sonographic Murphy's sign. Common bile duct: Diameter: Normal, 5 mm. Liver: No focal lesion identified. Within normal limits in parenchymal echogenicity. Portal vein is patent on color Doppler imaging with normal direction of blood flow towards the liver. IMPRESSION: Cholelithiasis without sonographic features of acute cholecystitis. Electronically Signed   By: JStaci RighterM.D.   On: 02/28/2019 10:50   ----------------------------------------------------------------------------------------------------------------------------------------------------------- Severity of Illness: The appropriate patient status for this patient is OBSERVATION. Observation status is judged to be reasonable and necessary in order to provide the required intensity of service to ensure the patient's safety. The patient's presenting symptoms, physical exam findings, and initial radiographic and laboratory data in the context of their medical condition is felt to place them at decreased risk for further clinical deterioration. Furthermore, it is anticipated that the patient will be medically stable for discharge from the hospital within 2 midnights of admission. The following factors support the patient status of observation.   " The patient's presenting symptoms include abdominal pain, nausea. " The physical exam findings include right upper quadrant tenderness. " The initial radiographic and laboratory data are elevated liver enzymes, gallstones.    Signed, BMarlowe Aschoff  Rashanda Magloire, MD [facility] 02/28/2019

## 2019-02-28 NOTE — Consult Note (Signed)
Reason for Consult:gallstones Referring Physician:  Dr. Marlowe Aschoff [place]  Stephanie Espinoza is an 64 y.o. female.  HPI: This is a pleasant 63 year old female who presented with the sudden onset of epigastric and right upper quadrant abdominal pain with nausea.  Pain was described as sharp and moderate to severe.  She underwent a CAT scan of the abdomen showing her to have mild gallbladder wall thickening.  An ultrasound confirmed gallstones.  The bile duct was millimeters.  She had elevated liver function tests.  Currently, her pain is improved but still present.  She has a medical history including a distant past history of drug use and is HIV positive.  She is followed by infectious disease and has been doing well.  He has no previous history of gallbladder attacks or hepatitis.  Past Medical History:  Diagnosis Date  . Abnormal Pap smear of cervix before 1995   always a repeat back to normal except for 1 time and had colpo bioosy -resutls were normal  . Allergic rhinitis   . Anxiety   . Bell's palsy after 1995 ?   no residual SE.  [name] Depression   . Genital herpes   . History of drug abuse ([place])    drug usser [place].  cocaine  . HIV (human immunodeficiency virus infection) ([place]) 1990   from sexual contact who is unknown  . Syphilis    treated    Past Surgical History:  Procedure Laterality Date  . CESAREAN SECTION  1997    Family History  Problem Relation Age of Onset  . Diabetes Father   . Breast cancer Other     Social History:  reports that she quit smoking about 29 years ago. Her smoking use included cigarettes. She has never used smokeless tobacco. She reports that she does not drink alcohol or use drugs.  Allergies:  Allergies  Allergen Reactions  . Haldol [Haloperidol] Other (See Comments)    Wipes out her memory  . Nevirapine     REACTION: Rash 10/08  . Thorazine [Chlorpromazine] Other (See Comments)    "makes me comatose"    Medications: I have reviewed the  patient's current medications.  Results for orders placed or performed during the hospital encounter of 02/28/19 (from the past 48 hour(s))  Comprehensive metabolic panel     Status: Abnormal   Collection Time: 02/28/19  9:51 AM  Result Value Ref Range   Sodium 134 (L) 135 - 145 mmol/L   Potassium 3.9 3.5 - 5.1 mmol/L   Chloride 105 98 - 111 mmol/L   CO2 21 (L) 22 - 32 mmol/L   Glucose, Bld 123 (H) 70 - 99 mg/dL   BUN 12 6 - 20 mg/dL   Creatinine, Ser 0.97 0.44 - 1.00 mg/dL   Calcium 8.6 (L) 8.9 - 10.3 mg/dL   Total Protein 8.0 6.5 - 8.1 g/dL   Albumin 3.5 3.5 - 5.0 g/dL   AST 709 (H) 15 - 41 U/L   ALT 313 (H) 0 - 44 U/L   Alkaline Phosphatase 186 (H) 38 - 126 U/L   Total Bilirubin 1.3 (H) 0.3 - 1.2 mg/dL   GFR calc non Af Amer >60 >60 mL/min   GFR calc Af Amer >60 >60 mL/min   Anion gap 8 5 - 15    Comment: Performed at [facility], [place] [address] [place], [place] [address]  Lipase, blood     Status: None   Collection Time: 02/28/19  9:51  AM  Result Value Ref Range   Lipase 45 11 - 51 U/L    Comment: Performed at [facility], [place] [address], [place], [place] [address]  CBC with Differential     Status: Abnormal   Collection Time: 02/28/19  9:51 AM  Result Value Ref Range   WBC 6.6 4.0 - 10.5 K/uL   RBC 4.19 3.87 - 5.11 MIL/uL   Hemoglobin 11.7 (L) 12.0 - 15.0 g/dL   HCT 37.8 36.0 - 46.0 %   MCV 90.2 80.0 - 100.0 fL   MCH 27.9 26.0 - 34.0 pg   MCHC 31.0 30.0 - 36.0 g/dL   RDW 12.9 11.5 - 15.5 %   Platelets 201 150 - 400 K/uL   nRBC 0.0 0.0 - 0.2 %   Neutrophils Relative % 68 %   Neutro Abs 4.4 1.7 - 7.7 K/uL   Lymphocytes Relative 27 %   Lymphs Abs 1.8 0.7 - 4.0 K/uL   Monocytes Relative 5 %   Monocytes Absolute 0.3 0.1 - 1.0 K/uL   Eosinophils Relative 0 %   Eosinophils Absolute 0.0 0.0 - 0.5 K/uL   Basophils Relative 0 %   Basophils Absolute 0.0 0.0 - 0.1 K/uL   Immature Granulocytes 0 %   Abs Immature Granulocytes 0.02  0.00 - 0.07 K/uL    Comment: Performed at [facility], [place] [address] [place], [place] [address]  Urinalysis, Routine w reflex microscopic     Status: Abnormal   Collection Time: 02/28/19  9:52 AM  Result Value Ref Range   Color, Urine YELLOW YELLOW   APPearance HAZY (A) CLEAR   Specific Gravity, Urine 1.018 1.005 - 1.030   pH 7.0 5.0 - 8.0   Glucose, UA NEGATIVE NEGATIVE mg/dL   Hgb urine dipstick NEGATIVE NEGATIVE   Bilirubin Urine NEGATIVE NEGATIVE   Ketones, ur NEGATIVE NEGATIVE mg/dL   Protein, ur 30 (A) NEGATIVE mg/dL   Nitrite NEGATIVE NEGATIVE   Leukocytes,Ua LARGE (A) NEGATIVE   RBC / HPF 6-10 0 - 5 RBC/hpf   WBC, UA 21-50 0 - 5 WBC/hpf   Bacteria, UA RARE (A) NONE SEEN   Squamous Epithelial / LPF 11-20 0 - 5   Mucus PRESENT     Comment: Performed at [facility], [place] [address], [place],  [address]    Ct Abdomen Pelvis W Contrast  Result Date: 02/28/2019 CLINICAL DATA:  63 year old with acute onset generalized abdominal pain and nausea that began at 2 o'clock this morning. Current history of treated HIV. Surgical history includes cesarean section. EXAM: CT ABDOMEN AND PELVIS WITH CONTRAST TECHNIQUE: Multidetector CT imaging of the abdomen and pelvis was performed using the standard protocol following bolus administration of intravenous contrast. CONTRAST:  157mL OMNIPAQUE IOHEXOL 300 MG/ML IV. COMPARISON:  None. FINDINGS: Lower chest: Mild atelectasis and/or scarring involving the BILATERAL lower lobes, LEFT greater than RIGHT, and the lingula. Heart size upper normal. Hepatobiliary: Liver normal in size and appearance. Mild gallbladder wall thickening. No calcified gallstones. No convincing pericholecystic edema/inflammation. No biliary ductal dilation. Pancreas: Normal in appearance without evidence of mass, ductal dilation, or inflammation. Spleen: Normal in size and appearance. Adrenals/Urinary Tract: Normal appearing adrenal  glands. Subcentimeter benign cortical cyst involving the LOWER pole the RIGHT kidney. No significant parenchymal abnormalities involving either kidney. No hydronephrosis. No urinary tract calculi. Normal appearing urinary bladder. Stomach/Bowel: Gas-filled diverticulum arising from the distal esophagus near the EG junction. Stomach normal in appearance for the degree  of distention. Normal-appearing small bowel. Extensive diffuse colonic diverticulosis without evidence of acute diverticulitis. Normal appendix in the RIGHT UPPER pelvis. Vascular/Lymphatic: Mild aortoiliac atherosclerosis without evidence of aneurysm. Normal-appearing portal venous and systemic venous systems. No pathologic lymphadenopathy. Numerous normal sized lymph nodes throughout the mesentery and retroperitoneum. Reproductive: Normal-appearing uterus and ovaries without evidence of adnexal mass. Other: None. Musculoskeletal: Broad-based central disc protrusion at L5-S1 and facet degenerative changes at this level. No acute findings. IMPRESSION: 1. Mild gallbladder wall thickening without evidence of calcified gallstones or pericholecystic edema/inflammation. Acalculous cholecystitis or chronic cholecystitis might have this appearance. RIGHT UPPER QUADRANT abdominal ultrasound may be helpful to confirm or deny this finding. 2. No acute abnormalities otherwise involving the abdomen or pelvis. 3. Diffuse colonic diverticulosis without evidence of acute diverticulitis. 4. Gas-filled diverticulum arising from the distal esophagus near the EG junction. Aortic Atherosclerosis (ICD10-170.0) Electronically Signed   By: Evangeline Dakin M.D.   On: 02/28/2019 13:38   US Abdomen Limited Ruq  Result Date: 02/28/2019 CLINICAL DATA:  RIGHT upper quadrant pain. EXAM: ULTRASOUND ABDOMEN LIMITED RIGHT UPPER QUADRANT COMPARISON:  None. FINDINGS: Gallbladder: Multiple shadowing gallstones and sludge. Difficult to measure a discrete calculus single dimension.  Wall thickness 2.0 mm. Negative sonographic Murphy's sign. Common bile duct: Diameter: Normal, 5 mm. Liver: No focal lesion identified. Within normal limits in parenchymal echogenicity. Portal vein is patent on color Doppler imaging with normal direction of blood flow towards the liver. IMPRESSION: Cholelithiasis without sonographic features of acute cholecystitis. Electronically Signed   By: Staci Righter M.D.   On: 02/28/2019 10:50    Review of Systems  Constitutional: Negative for chills and fever.  Respiratory: Negative for cough and shortness of breath.   Cardiovascular: Negative for chest pain.  Gastrointestinal: Positive for abdominal pain and nausea. Negative for vomiting.  Genitourinary: Negative for dysuria.  All other systems reviewed and are negative.  Blood pressure (!) 157/101, pulse 74, temperature 98.7 F (37.1 C), temperature source Oral, resp. rate 18, last menstrual period 11/04/1997, SpO2 95 %. Physical Exam  Constitutional: She is oriented to person, place, and time. She appears well-developed and well-nourished. No distress.  HENT:  Head: Normocephalic and atraumatic.  Right Ear: External ear normal.  Left Ear: External ear normal.  Nose: Nose normal.  Mouth/Throat: Oropharynx is clear and moist. No oropharyngeal exudate.  Eyes: Pupils are equal, round, and reactive to light. Right eye exhibits no discharge. Left eye exhibits no discharge. No scleral icterus.  Neck: Normal range of motion. No tracheal deviation present. No thyromegaly present.  Cardiovascular: Normal rate, regular rhythm, normal heart sounds and intact distal pulses.  No murmur heard. Respiratory: Effort normal and breath sounds normal. No respiratory distress.  GI: Soft. She exhibits no distension. There is abdominal tenderness. There is guarding.  There is mild tenderness with some guarding in the right upper quadrant  Musculoskeletal: Normal range of motion.        General: No tenderness,  deformity or edema.  Lymphadenopathy:    She has no cervical adenopathy.  Neurological: She is alert and oriented to person, place, and time.  Skin: Skin is warm. She is not diaphoretic. No erythema.  Psychiatric: Her behavior is normal. Judgment normal.    Assessment/Plan: This is a patient with symptomatic cholelithiasis and possible choledocholithiasis  Because of her multiple comorbidities, she has been admitted to the medical service.  GI has been asked to see the patient as well.  Explained the diagnosis to her in detail.  Depending on her liver function tests and GIs opinion, she may or may not need an MRCP preoperatively.  I discussed a laparoscopic cholecystectomy and cholangiogram with her in detail.  This would likely be performed this week by our acute care surgeon on-call.  She understands and agrees with the plans.  Coralie Keens 02/28/2019, 4:42 PM

## 2019-02-28 NOTE — ED Notes (Signed)
I have just given report and will transport shortly. Pt. Remains in no distress.

## 2019-02-28 NOTE — ED Triage Notes (Signed)
Pt sent from urgent care for upper abdominal pain and nausea since 2AM this morning. Pt was able to drink water but feels like she need to throw up. Pt denies emesis or diarrhea.

## 2019-02-28 NOTE — ED Provider Notes (Signed)
[facility] DEPT Provider Note   CSN: [id number] Arrival date & time: 02/28/19  5638    History   Chief Complaint Chief Complaint  Patient presents with  . Abdominal Pain  . Nausea    HPI Stephanie Espinoza is a 63 y.o. female.     HPI Patient presents to the emergency department with right upper quadrant abdominal pain that started suddenly around 3 AM.  The patient states that nothing seems to make the condition better but certain movements and palpation of the abdomen make the pain worse.  Patient states that she went to an urgent care and they sent her here for further evaluation.  The patient states she had nausea but no vomiting.  Patient denies any fevers.  The patient denies chest pain, shortness of breath, headache,blurred vision, neck pain, fever, cough, weakness, numbness, dizziness, anorexia, edema,  vomiting, diarrhea, rash, back pain, dysuria, hematemesis, bloody stool, near syncope, or syncope. Past Medical History:  Diagnosis Date  . Abnormal Pap smear of cervix before 1995   always a repeat back to normal except for 1 time and had colpo bioosy -resutls were normal  . Allergic rhinitis   . Anxiety   . Bell's palsy after 1995 ?   no residual SE.  [name] Depression   . Genital herpes   . History of drug abuse ([place])    drug usser [place].  cocaine  . HIV (human immunodeficiency virus infection) ([place]) 1990   from sexual contact who is unknown  . Syphilis    treated    Patient Active Problem List   Diagnosis Date Noted  . Elevated liver enzymes 02/28/2019  . Headache 06/30/2018  . Salivary gland adenitis 02/18/2018  . Sciatica 05/26/2017  . 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 Low back pain 06/12/2016  . Essential hypertension 09/16/2014  . 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 Sinusitis 03/22/2009  . Human immunodeficiency virus (HIV) disease ([place]) 09/06/2006  . GENITAL HERPES 09/06/2006  .  SYPHILIS NOS 09/06/2006  . Depression 09/06/2006  . BELL'S PALSY 09/06/2006  . PAP SMEAR, ABNORMAL 09/06/2006    Past Surgical History:  Procedure Laterality Date  . CESAREAN SECTION  1997     OB History    Gravida  4   Para  4   Term  4   Preterm      AB      Living  4     SAB      TAB      Ectopic      Multiple      Live Births  4            Home Medications    Prior to Admission medications   Medication Sig Start Date End Date Taking? Authorizing Provider  albuterol (PROVENTIL HFA;VENTOLIN HFA) 108 (90 Base) MCG/ACT inhaler Inhale 2 puffs into the lungs every 6 (six) hours as needed for wheezing or shortness of breath. 05/27/16  Yes Hoyt Koch, MD  valACYclovir (VALTREX) 500 MG tablet 1 po q day and increase to bid x 3 days with symptoms. 11/06/18  Yes Megan Salon, MD    Family History Family History  Problem Relation Age of Onset  . Diabetes Father   . Breast cancer Other     Social History Social History   Tobacco Use  . Smoking status: Former Smoker    Types: Cigarettes    Last attempt to  quit: 03/01/1990    Years since quitting: 29.0  . Smokeless tobacco: Never Used  Substance Use Topics  . Alcohol use: No  . Drug use: No     Allergies   Haldol [haloperidol]; Nevirapine; and Thorazine [chlorpromazine]   Review of Systems Review of Systems  All other systems negative except as documented in the HPI. All pertinent positives and negatives as reviewed in the HPI. Physical Exam Updated Vital Signs BP (!) 157/101   Pulse 74   Temp 98.7 F (37.1 C) (Oral)   Resp 18   LMP 11/04/1997   SpO2 95%   Physical Exam Vitals signs and nursing note reviewed.  Constitutional:      General: She is not in acute distress.    Appearance: She is well-developed.  HENT:     Head: Normocephalic and atraumatic.  Eyes:     Pupils: Pupils are equal, round, and reactive to light.  Neck:     Musculoskeletal: Normal range of motion  and neck supple.  Cardiovascular:     Rate and Rhythm: Normal rate and regular rhythm.     Heart sounds: Normal heart sounds. No murmur. No friction rub. No gallop.   Pulmonary:     Effort: Pulmonary effort is normal. No respiratory distress.     Breath sounds: Normal breath sounds. No wheezing.  Abdominal:     General: Bowel sounds are normal. There is no distension.     Palpations: Abdomen is soft.     Tenderness: There is abdominal tenderness in the right upper quadrant, epigastric area and periumbilical area. There is no guarding or rebound. Negative signs include Murphy's sign.  Skin:    General: Skin is warm and dry.     Capillary Refill: Capillary refill takes less than 2 seconds.     Findings: No erythema or rash.  Neurological:     Mental Status: She is alert and oriented to person, place, and time.     Motor: No abnormal muscle tone.     Coordination: Coordination normal.  Psychiatric:        Behavior: Behavior normal.      ED Treatments / Results  Labs (all labs ordered are listed, but only abnormal results are displayed) Labs Reviewed  COMPREHENSIVE METABOLIC PANEL - Abnormal; Notable for the following components:      Result Value   Sodium 134 (*)    CO2 21 (*)    Glucose, Bld 123 (*)    Calcium 8.6 (*)    AST 709 (*)    ALT 313 (*)    Alkaline Phosphatase 186 (*)    Total Bilirubin 1.3 (*)    All other components within normal limits  CBC WITH DIFFERENTIAL/PLATELET - Abnormal; Notable for the following components:   Hemoglobin 11.7 (*)    All other components within normal limits  URINALYSIS, ROUTINE W REFLEX MICROSCOPIC - Abnormal; Notable for the following components:   APPearance HAZY (*)    Protein, ur 30 (*)    Leukocytes,Ua LARGE (*)    Bacteria, UA RARE (*)    All other components within normal limits  LIPASE, BLOOD    EKG None  Radiology Ct Abdomen Pelvis W Contrast  Result Date: 02/28/2019 CLINICAL DATA:  62 year old with acute onset  generalized abdominal pain and nausea that began at 2 o'clock this morning. Current history of treated HIV. Surgical history includes cesarean section. EXAM: CT ABDOMEN AND PELVIS WITH CONTRAST TECHNIQUE: Multidetector CT imaging of the abdomen and pelvis  was performed using the standard protocol following bolus administration of intravenous contrast. CONTRAST:  123mL OMNIPAQUE IOHEXOL 300 MG/ML IV. COMPARISON:  None. FINDINGS: Lower chest: Mild atelectasis and/or scarring involving the BILATERAL lower lobes, LEFT greater than RIGHT, and the lingula. Heart size upper normal. Hepatobiliary: Liver normal in size and appearance. Mild gallbladder wall thickening. No calcified gallstones. No convincing pericholecystic edema/inflammation. No biliary ductal dilation. Pancreas: Normal in appearance without evidence of mass, ductal dilation, or inflammation. Spleen: Normal in size and appearance. Adrenals/Urinary Tract: Normal appearing adrenal glands. Subcentimeter benign cortical cyst involving the LOWER pole the RIGHT kidney. No significant parenchymal abnormalities involving either kidney. No hydronephrosis. No urinary tract calculi. Normal appearing urinary bladder. Stomach/Bowel: Gas-filled diverticulum arising from the distal esophagus near the EG junction. Stomach normal in appearance for the degree of distention. Normal-appearing small bowel. Extensive diffuse colonic diverticulosis without evidence of acute diverticulitis. Normal appendix in the RIGHT UPPER pelvis. Vascular/Lymphatic: Mild aortoiliac atherosclerosis without evidence of aneurysm. Normal-appearing portal venous and systemic venous systems. No pathologic lymphadenopathy. Numerous normal sized lymph nodes throughout the mesentery and retroperitoneum. Reproductive: Normal-appearing uterus and ovaries without evidence of adnexal mass. Other: None. Musculoskeletal: Broad-based central disc protrusion at L5-S1 and facet degenerative changes at this level.  No acute findings. IMPRESSION: 1. Mild gallbladder wall thickening without evidence of calcified gallstones or pericholecystic edema/inflammation. Acalculous cholecystitis or chronic cholecystitis might have this appearance. RIGHT UPPER QUADRANT abdominal ultrasound may be helpful to confirm or deny this finding. 2. No acute abnormalities otherwise involving the abdomen or pelvis. 3. Diffuse colonic diverticulosis without evidence of acute diverticulitis. 4. Gas-filled diverticulum arising from the distal esophagus near the EG junction. Aortic Atherosclerosis (ICD10-170.0) Electronically Signed   By: Evangeline Dakin M.D.   On: 02/28/2019 13:38   US Abdomen Limited Ruq  Result Date: 02/28/2019 CLINICAL DATA:  RIGHT upper quadrant pain. EXAM: ULTRASOUND ABDOMEN LIMITED RIGHT UPPER QUADRANT COMPARISON:  None. FINDINGS: Gallbladder: Multiple shadowing gallstones and sludge. Difficult to measure a discrete calculus single dimension. Wall thickness 2.0 mm. Negative sonographic Murphy's sign. Common bile duct: Diameter: Normal, 5 mm. Liver: No focal lesion identified. Within normal limits in parenchymal echogenicity. Portal vein is patent on color Doppler imaging with normal direction of blood flow towards the liver. IMPRESSION: Cholelithiasis without sonographic features of acute cholecystitis. Electronically Signed   By: Staci Righter M.D.   On: 02/28/2019 10:50    Procedures Procedures (including critical care time)  Medications Ordered in ED Medications  sodium chloride (PF) 0.9 % injection (has no administration in time range)  sodium chloride flush (NS) 0.9 % injection 3 mL (has no administration in time range)  acetaminophen (TYLENOL) tablet 650 mg (has no administration in time range)    Or  acetaminophen (TYLENOL) suppository 650 mg (has no administration in time range)  senna (SENOKOT) tablet 8.6 mg (has no administration in time range)  magnesium hydroxide (MILK OF MAGNESIA) suspension 30 mL  (has no administration in time range)  sorbitol 70 % solution 30 mL (has no administration in time range)  sodium phosphate (FLEET) 7-19 GM/118ML enema 1 enema (has no administration in time range)  ondansetron (ZOFRAN) tablet 4 mg (has no administration in time range)    Or  ondansetron (ZOFRAN) injection 4 mg (has no administration in time range)  enoxaparin (LOVENOX) injection 40 mg (has no administration in time range)  0.9 %  sodium chloride infusion (has no administration in time range)  oxyCODONE (Oxy IR/ROXICODONE) immediate release tablet 5 mg (  has no administration in time range)  sodium chloride 0.9 % bolus 1,000 mL (0 mLs Intravenous Stopped 02/28/19 1113)  fentaNYL (SUBLIMAZE) injection 100 mcg (100 mcg Intravenous Given 02/28/19 1001)  morphine 4 MG/ML injection 4 mg (4 mg Intravenous Given 02/28/19 1232)  iohexol (OMNIPAQUE) 300 MG/ML solution 100 mL (100 mLs Intravenous Contrast Given 02/28/19 1239)     Initial Impression / Assessment and Plan / ED Course  I have reviewed the triage vital signs and the nursing notes.  Pertinent labs & imaging results that were available during my care of the patient were reviewed by me and considered in my medical decision making (see chart for details).        Spoke with general surgery in the [facility] will admit the patient for further evaluation care of her gallbladder related potential issues.  Patient is advised the plan and all questions were answered.  Final Clinical Impressions(s) / ED Diagnoses   Final diagnoses:  Abdominal pain, RUQ    ED Discharge Orders    None       Dalia Heading, PA-C 02/28/19 1500    Veryl Speak, MD 03/01/19 [phone]

## 2019-02-28 NOTE — ED Notes (Signed)
ED TO INPATIENT HANDOFF REPORT  Name/Age/Gender Stephanie Espinoza 63 y.o. female  Code Status    Code Status Orders  (From admission, onward)         Start     Ordered   02/28/19 1429  Do not attempt resuscitation (DNR)  Continuous    Question Answer Comment  In the event of cardiac or respiratory ARREST Do not call a "code blue"   In the event of cardiac or respiratory ARREST Do not perform Intubation, CPR, defibrillation or ACLS   In the event of cardiac or respiratory ARREST Use medication by any route, position, wound care, and other measures to relive pain and suffering. May use oxygen, suction and manual treatment of airway obstruction as needed for comfort.      02/28/19 1429        Code Status History    This patient has a current code status but no historical code status.      Home/SNF/Other Home  Chief Complaint abdominal pain  Level of Care/Admitting Diagnosis ED Disposition    ED Disposition Condition [facility] Area: [facility] [[id number]]  Level of Care: Med-Surg [16]  Covid Evaluation: N/A  Diagnosis: Elevated liver enzymes [353614]  Admitting Physician: Terrilee Croak [[id number]]  Attending Physician: Terrilee Croak [[id number]]  PT Class (Do Not Modify): Observation [104]  PT Acc Code (Do Not Modify): Observation [10022]       Medical History Past Medical History:  Diagnosis Date  . Abnormal Pap smear of cervix before 1995   always a repeat back to normal except for 1 time and had colpo bioosy -resutls were normal  . Allergic rhinitis   . Anxiety   . Bell's palsy after 1995 ?   no residual SE.  [name] Depression   . Genital herpes   . History of drug abuse ([place])    drug usser [place].  cocaine  . HIV (human immunodeficiency virus infection) ([place]) 1990   from sexual contact who is unknown  . Syphilis    treated    Allergies Allergies  Allergen Reactions  . Haldol [Haloperidol] Other (See Comments)    Wipes  out her memory  . Nevirapine     REACTION: Rash 10/08  . Thorazine [Chlorpromazine] Other (See Comments)    "makes me comatose"    IV Location/Drains/Wounds Patient Lines/Drains/Airways Status   Active Line/Drains/Airways    Name:   Placement date:   Placement time:   Site:   Days:   Peripheral IV 02/28/19 Left;Anterior;Proximal Forearm   02/28/19    0953    Forearm   less than 1          Labs/Imaging Results for orders placed or performed during the hospital encounter of 02/28/19 (from the past 48 hour(s))  Comprehensive metabolic panel     Status: Abnormal   Collection Time: 02/28/19  9:51 AM  Result Value Ref Range   Sodium 134 (L) 135 - 145 mmol/L   Potassium 3.9 3.5 - 5.1 mmol/L   Chloride 105 98 - 111 mmol/L   CO2 21 (L) 22 - 32 mmol/L   Glucose, Bld 123 (H) 70 - 99 mg/dL   BUN 12 6 - 20 mg/dL   Creatinine, Ser 0.97 0.44 - 1.00 mg/dL   Calcium 8.6 (L) 8.9 - 10.3 mg/dL   Total Protein 8.0 6.5 - 8.1 g/dL   Albumin 3.5 3.5 - 5.0 g/dL   AST 709 (H) 15 -  41 U/L   ALT 313 (H) 0 - 44 U/L   Alkaline Phosphatase 186 (H) 38 - 126 U/L   Total Bilirubin 1.3 (H) 0.3 - 1.2 mg/dL   GFR calc non Af Amer >60 >60 mL/min   GFR calc Af Amer >60 >60 mL/min   Anion gap 8 5 - 15    Comment: Performed at [facility], [place] [address] [place], [place] [address]  Lipase, blood     Status: None   Collection Time: 02/28/19  9:51 AM  Result Value Ref Range   Lipase 45 11 - 51 U/L    Comment: Performed at [facility], [place] [address] [place], [place] [address]  CBC with Differential     Status: Abnormal   Collection Time: 02/28/19  9:51 AM  Result Value Ref Range   WBC 6.6 4.0 - 10.5 K/uL   RBC 4.19 3.87 - 5.11 MIL/uL   Hemoglobin 11.7 (L) 12.0 - 15.0 g/dL   HCT 37.8 36.0 - 46.0 %   MCV 90.2 80.0 - 100.0 fL   MCH 27.9 26.0 - 34.0 pg   MCHC 31.0 30.0 - 36.0 g/dL   RDW 12.9 11.5 - 15.5 %   Platelets 201 150 - 400 K/uL   nRBC 0.0 0.0 - 0.2 %    Neutrophils Relative % 68 %   Neutro Abs 4.4 1.7 - 7.7 K/uL   Lymphocytes Relative 27 %   Lymphs Abs 1.8 0.7 - 4.0 K/uL   Monocytes Relative 5 %   Monocytes Absolute 0.3 0.1 - 1.0 K/uL   Eosinophils Relative 0 %   Eosinophils Absolute 0.0 0.0 - 0.5 K/uL   Basophils Relative 0 %   Basophils Absolute 0.0 0.0 - 0.1 K/uL   Immature Granulocytes 0 %   Abs Immature Granulocytes 0.02 0.00 - 0.07 K/uL    Comment: Performed at [facility], [place] [address] [place], [place] [address]  Urinalysis, Routine w reflex microscopic     Status: Abnormal   Collection Time: 02/28/19  9:52 AM  Result Value Ref Range   Color, Urine YELLOW YELLOW   APPearance HAZY (A) CLEAR   Specific Gravity, Urine 1.018 1.005 - 1.030   pH 7.0 5.0 - 8.0   Glucose, UA NEGATIVE NEGATIVE mg/dL   Hgb urine dipstick NEGATIVE NEGATIVE   Bilirubin Urine NEGATIVE NEGATIVE   Ketones, ur NEGATIVE NEGATIVE mg/dL   Protein, ur 30 (A) NEGATIVE mg/dL   Nitrite NEGATIVE NEGATIVE   Leukocytes,Ua LARGE (A) NEGATIVE   RBC / HPF 6-10 0 - 5 RBC/hpf   WBC, UA 21-50 0 - 5 WBC/hpf   Bacteria, UA RARE (A) NONE SEEN   Squamous Epithelial / LPF 11-20 0 - 5   Mucus PRESENT     Comment: Performed at [facility], [place] [address] [place], [place] [address]   Ct Abdomen Pelvis W Contrast  Result Date: 02/28/2019 CLINICAL DATA:  63 year old with acute onset generalized abdominal pain and nausea that began at 2 o'clock this morning. Current history of treated HIV. Surgical history includes cesarean section. EXAM: CT ABDOMEN AND PELVIS WITH CONTRAST TECHNIQUE: Multidetector CT imaging of the abdomen and pelvis was performed using the standard protocol following bolus administration of intravenous contrast. CONTRAST:  135mL OMNIPAQUE IOHEXOL 300 MG/ML IV. COMPARISON:  None. FINDINGS: Lower chest: Mild atelectasis and/or scarring involving the BILATERAL lower lobes, LEFT greater than RIGHT, and the lingula. Heart  size upper normal. Hepatobiliary: Liver normal  in size and appearance. Mild gallbladder wall thickening. No calcified gallstones. No convincing pericholecystic edema/inflammation. No biliary ductal dilation. Pancreas: Normal in appearance without evidence of mass, ductal dilation, or inflammation. Spleen: Normal in size and appearance. Adrenals/Urinary Tract: Normal appearing adrenal glands. Subcentimeter benign cortical cyst involving the LOWER pole the RIGHT kidney. No significant parenchymal abnormalities involving either kidney. No hydronephrosis. No urinary tract calculi. Normal appearing urinary bladder. Stomach/Bowel: Gas-filled diverticulum arising from the distal esophagus near the EG junction. Stomach normal in appearance for the degree of distention. Normal-appearing small bowel. Extensive diffuse colonic diverticulosis without evidence of acute diverticulitis. Normal appendix in the RIGHT UPPER pelvis. Vascular/Lymphatic: Mild aortoiliac atherosclerosis without evidence of aneurysm. Normal-appearing portal venous and systemic venous systems. No pathologic lymphadenopathy. Numerous normal sized lymph nodes throughout the mesentery and retroperitoneum. Reproductive: Normal-appearing uterus and ovaries without evidence of adnexal mass. Other: None. Musculoskeletal: Broad-based central disc protrusion at L5-S1 and facet degenerative changes at this level. No acute findings. IMPRESSION: 1. Mild gallbladder wall thickening without evidence of calcified gallstones or pericholecystic edema/inflammation. Acalculous cholecystitis or chronic cholecystitis might have this appearance. RIGHT UPPER QUADRANT abdominal ultrasound may be helpful to confirm or deny this finding. 2. No acute abnormalities otherwise involving the abdomen or pelvis. 3. Diffuse colonic diverticulosis without evidence of acute diverticulitis. 4. Gas-filled diverticulum arising from the distal esophagus near the EG junction. Aortic  Atherosclerosis (ICD10-170.0) Electronically Signed   By: Evangeline Dakin M.D.   On: 02/28/2019 13:38   US Abdomen Limited Ruq  Result Date: 02/28/2019 CLINICAL DATA:  RIGHT upper quadrant pain. EXAM: ULTRASOUND ABDOMEN LIMITED RIGHT UPPER QUADRANT COMPARISON:  None. FINDINGS: Gallbladder: Multiple shadowing gallstones and sludge. Difficult to measure a discrete calculus single dimension. Wall thickness 2.0 mm. Negative sonographic Murphy's sign. Common bile duct: Diameter: Normal, 5 mm. Liver: No focal lesion identified. Within normal limits in parenchymal echogenicity. Portal vein is patent on color Doppler imaging with normal direction of blood flow towards the liver. IMPRESSION: Cholelithiasis without sonographic features of acute cholecystitis. Electronically Signed   By: Staci Righter M.D.   On: 02/28/2019 10:50    Pending Labs Unresulted Labs (From admission, onward)    Start     Ordered   03/01/19 6270  Basic metabolic panel  Daily,   R     02/28/19 1429   03/01/19 0500  CBC  Daily,   R     02/28/19 1429          Vitals/Pain Today's Vitals   02/28/19 1100 02/28/19 1200 02/28/19 1300 02/28/19 1430  BP: (!) 150/82 (!) 163/90 (!) 147/90 (!) 157/101  Pulse: 68 70 73 74  Resp:      Temp:      TempSrc:      SpO2: 95% 98% 93% 95%  PainSc: 1        Isolation Precautions No active isolations  Medications Medications  sodium chloride (PF) 0.9 % injection (has no administration in time range)  sodium chloride flush (NS) 0.9 % injection 3 mL (has no administration in time range)  acetaminophen (TYLENOL) tablet 650 mg (has no administration in time range)    Or  acetaminophen (TYLENOL) suppository 650 mg (has no administration in time range)  senna (SENOKOT) tablet 8.6 mg (has no administration in time range)  magnesium hydroxide (MILK OF MAGNESIA) suspension 30 mL (has no administration in time range)  sorbitol 70 % solution 30 mL (has no administration in time range)  sodium  phosphate (FLEET) 7-19 GM/118ML  enema 1 enema (has no administration in time range)  ondansetron (ZOFRAN) tablet 4 mg (has no administration in time range)    Or  ondansetron (ZOFRAN) injection 4 mg (has no administration in time range)  enoxaparin (LOVENOX) injection 40 mg (has no administration in time range)  0.9 %  sodium chloride infusion (has no administration in time range)  oxyCODONE (Oxy IR/ROXICODONE) immediate release tablet 5 mg (has no administration in time range)  sodium chloride 0.9 % bolus 1,000 mL (0 mLs Intravenous Stopped 02/28/19 1113)  fentaNYL (SUBLIMAZE) injection 100 mcg (100 mcg Intravenous Given 02/28/19 1001)  morphine 4 MG/ML injection 4 mg (4 mg Intravenous Given 02/28/19 1232)  iohexol (OMNIPAQUE) 300 MG/ML solution 100 mL (100 mLs Intravenous Contrast Given 02/28/19 1239)    Mobility walks

## 2019-03-01 ENCOUNTER — Observation Stay (HOSPITAL_COMMUNITY): Payer: BC Managed Care – PPO

## 2019-03-01 ENCOUNTER — Encounter (HOSPITAL_COMMUNITY)
Admission: EM | Disposition: A | Discharge status: Home / Self Care | Payer: Self-pay | Source: Home / Self Care | Attending: Internal Medicine

## 2019-03-01 ENCOUNTER — Observation Stay (HOSPITAL_COMMUNITY): Payer: BC Managed Care – PPO | Admitting: Certified Registered"

## 2019-03-01 DIAGNOSIS — B20 Human immunodeficiency virus [HIV] disease: Secondary | ICD-10-CM

## 2019-03-01 DIAGNOSIS — I10 Essential (primary) hypertension: Secondary | ICD-10-CM | POA: Diagnosis not present

## 2019-03-01 DIAGNOSIS — K8050 Calculus of bile duct without cholangitis or cholecystitis without obstruction: Secondary | ICD-10-CM

## 2019-03-01 DIAGNOSIS — D649 Anemia, unspecified: Secondary | ICD-10-CM

## 2019-03-01 DIAGNOSIS — E669 Obesity, unspecified: Secondary | ICD-10-CM

## 2019-03-01 DIAGNOSIS — K812 Acute cholecystitis with chronic cholecystitis: Secondary | ICD-10-CM | POA: Diagnosis not present

## 2019-03-01 DIAGNOSIS — R739 Hyperglycemia, unspecified: Secondary | ICD-10-CM

## 2019-03-01 DIAGNOSIS — R748 Abnormal levels of other serum enzymes: Secondary | ICD-10-CM | POA: Diagnosis not present

## 2019-03-01 HISTORY — PX: LAPAROSCOPIC CHOLECYSTECTOMY SINGLE PORT: SHX5891

## 2019-03-01 LAB — BASIC METABOLIC PANEL
Anion gap: 7 (ref 5–15)
BUN: 9 mg/dL (ref 6–20)
CO2: 22 mmol/L (ref 22–32)
Calcium: 8.3 mg/dL — ABNORMAL LOW (ref 8.9–10.3)
Chloride: 111 mmol/L (ref 98–111)
Creatinine, Ser: 0.98 mg/dL (ref 0.44–1.00)
GFR calc Af Amer: >60 mL/min (ref >60)
GFR calc non Af Amer: >60 mL/min (ref >60)
Glucose, Bld: 90 mg/dL (ref 70–99)
Potassium: 3.7 mmol/L (ref 3.5–5.1)
Sodium: 140 mmol/L (ref 135–145)

## 2019-03-01 LAB — HEPATIC FUNCTION PANEL
ALT: 530 U/L — ABNORMAL HIGH (ref 0–44)
AST: 643 U/L — ABNORMAL HIGH (ref 15–41)
Albumin: 3 g/dL — ABNORMAL LOW (ref 3.5–5.0)
Alkaline Phosphatase: 224 U/L — ABNORMAL HIGH (ref 38–126)
Bilirubin, Direct: 0.5 mg/dL — ABNORMAL HIGH (ref 0.0–0.2)
Indirect Bilirubin: 1.1 mg/dL — ABNORMAL HIGH (ref 0.3–0.9)
Total Bilirubin: 1.6 mg/dL — ABNORMAL HIGH (ref 0.3–1.2)
Total Protein: 7 g/dL (ref 6.5–8.1)

## 2019-03-01 LAB — CBC
HCT: 34.8 % — ABNORMAL LOW (ref 36.0–46.0)
Hemoglobin: 10.5 g/dL — ABNORMAL LOW (ref 12.0–15.0)
MCH: 27.5 pg (ref 26.0–34.0)
MCHC: 30.2 g/dL (ref 30.0–36.0)
MCV: 91.1 fL (ref 80.0–100.0)
Platelets: 189 10*3/uL (ref 150–400)
RBC: 3.82 MIL/uL — ABNORMAL LOW (ref 3.87–5.11)
RDW: 13.1 % (ref 11.5–15.5)
WBC: 4 10*3/uL (ref 4.0–10.5)
nRBC: 0 % (ref 0.0–0.2)

## 2019-03-01 LAB — GLUCOSE, CAPILLARY: Glucose-Capillary: 128 mg/dL — ABNORMAL HIGH (ref 70–99)

## 2019-03-01 SURGERY — LAPAROSCOPIC CHOLECYSTECTOMY SINGLE SITE
Anesthesia: General

## 2019-03-01 MED ORDER — GABAPENTIN 300 MG PO CAPS
300.0000 mg | ORAL_CAPSULE | Freq: Two times a day (BID) | ORAL | Status: DC
  Administered 2019-03-01: 300 mg via ORAL
  Filled 2019-03-01 (×2): qty 1

## 2019-03-01 MED ORDER — OXYCODONE HCL 5 MG/5ML PO SOLN: 5.0000 mg | Freq: Once | ORAL | Status: DC | PRN

## 2019-03-01 MED ORDER — FENTANYL CITRATE (PF) 250 MCG/5ML IJ SOLN
INTRAMUSCULAR | Status: DC | PRN
  Administered 2019-03-01 (×2): 50 ug via INTRAVENOUS
  Administered 2019-03-01: 100 ug via INTRAVENOUS
  Administered 2019-03-01: 50 ug via INTRAVENOUS

## 2019-03-01 MED ORDER — MIDAZOLAM HCL 2 MG/2ML IJ SOLN
INTRAMUSCULAR | Status: DC | PRN
  Administered 2019-03-01: 2 mg via INTRAVENOUS

## 2019-03-01 MED ORDER — GABAPENTIN 300 MG PO CAPS
300.0000 mg | ORAL_CAPSULE | ORAL | Status: AC
  Administered 2019-03-01: 300 mg via ORAL

## 2019-03-01 MED ORDER — GABAPENTIN 300 MG PO CAPS
ORAL_CAPSULE | ORAL | Status: AC
  Filled 2019-03-01: qty 1

## 2019-03-01 MED ORDER — PROPOFOL 10 MG/ML IV BOLUS
INTRAVENOUS | Status: AC
  Filled 2019-03-01: qty 40

## 2019-03-01 MED ORDER — SODIUM CHLORIDE 0.9 % IV SOLN
2.0000 g | INTRAVENOUS | Status: AC
  Filled 2019-03-01 (×2): qty 20

## 2019-03-01 MED ORDER — DEXAMETHASONE SODIUM PHOSPHATE 10 MG/ML IJ SOLN
INTRAMUSCULAR | Status: AC
  Filled 2019-03-01: qty 1

## 2019-03-01 MED ORDER — GLUCAGON HCL RDNA (DIAGNOSTIC) 1 MG IJ SOLR
INTRAMUSCULAR | Status: AC
  Filled 2019-03-01: qty 1

## 2019-03-01 MED ORDER — GADOBUTROL 1 MMOL/ML IV SOLN
8.0000 mL | Freq: Once | INTRAVENOUS | Status: AC | PRN
  Administered 2019-03-01: 19:00:00 8 mL via INTRAVENOUS

## 2019-03-01 MED ORDER — ACETAMINOPHEN 500 MG PO TABS
1000.0000 mg | ORAL_TABLET | ORAL | Status: AC
  Administered 2019-03-01: 1000 mg via ORAL

## 2019-03-01 MED ORDER — ONDANSETRON HCL 4 MG/2ML IJ SOLN
INTRAMUSCULAR | Status: AC
  Filled 2019-03-01: qty 2

## 2019-03-01 MED ORDER — FENTANYL CITRATE (PF) 250 MCG/5ML IJ SOLN
INTRAMUSCULAR | Status: AC
  Filled 2019-03-01: qty 5

## 2019-03-01 MED ORDER — METRONIDAZOLE IN NACL 5-0.79 MG/ML-% IV SOLN
INTRAVENOUS | Status: AC
  Filled 2019-03-01: qty 100

## 2019-03-01 MED ORDER — ALBUTEROL SULFATE (2.5 MG/3ML) 0.083% IN NEBU: 2.5000 mg | INHALATION_SOLUTION | Freq: Four times a day (QID) | RESPIRATORY_TRACT | Status: DC | PRN

## 2019-03-01 MED ORDER — CELECOXIB 200 MG PO CAPS
ORAL_CAPSULE | ORAL | Status: AC
  Filled 2019-03-01: qty 2

## 2019-03-01 MED ORDER — EPHEDRINE SULFATE-NACL 50-0.9 MG/10ML-% IV SOSY
PREFILLED_SYRINGE | INTRAVENOUS | Status: DC | PRN
  Administered 2019-03-01 (×3): 5 mg via INTRAVENOUS

## 2019-03-01 MED ORDER — METHOCARBAMOL 500 MG PO TABS: 1000.0000 mg | ORAL_TABLET | Freq: Four times a day (QID) | ORAL | Status: DC | PRN

## 2019-03-01 MED ORDER — HYDRALAZINE HCL 20 MG/ML IJ SOLN
10.0000 mg | Freq: Once | INTRAMUSCULAR | Status: AC
  Administered 2019-03-01: 10 mg via INTRAVENOUS
  Filled 2019-03-01: qty 1

## 2019-03-01 MED ORDER — ROCURONIUM BROMIDE 10 MG/ML (PF) SYRINGE
PREFILLED_SYRINGE | INTRAVENOUS | Status: DC | PRN
  Administered 2019-03-01: 40 mg via INTRAVENOUS
  Administered 2019-03-01: 20 mg via INTRAVENOUS

## 2019-03-01 MED ORDER — PROPOFOL 10 MG/ML IV BOLUS
INTRAVENOUS | Status: DC | PRN
  Administered 2019-03-01: 170 mg via INTRAVENOUS

## 2019-03-01 MED ORDER — DEXAMETHASONE SODIUM PHOSPHATE 10 MG/ML IJ SOLN
INTRAMUSCULAR | Status: DC | PRN
  Administered 2019-03-01: 8 mg via INTRAVENOUS

## 2019-03-01 MED ORDER — ACETAMINOPHEN 160 MG/5ML PO SOLN: 1000.0000 mg | Freq: Once | ORAL | Status: DC | PRN

## 2019-03-01 MED ORDER — LACTATED RINGERS IV SOLN
INTRAVENOUS | Status: DC
  Administered 2019-03-01: 10:00:00 via INTRAVENOUS

## 2019-03-01 MED ORDER — ACETAMINOPHEN 500 MG PO TABS
ORAL_TABLET | ORAL | Status: AC
  Filled 2019-03-01: qty 2

## 2019-03-01 MED ORDER — OXYCODONE HCL 5 MG PO TABS: 5.0000 mg | ORAL_TABLET | Freq: Once | ORAL | Status: DC | PRN

## 2019-03-01 MED ORDER — FENTANYL CITRATE (PF) 100 MCG/2ML IJ SOLN: 25.0000 ug | INTRAMUSCULAR | Status: DC | PRN

## 2019-03-01 MED ORDER — BUPIVACAINE-EPINEPHRINE (PF) 0.25% -1:200000 IJ SOLN
INTRAMUSCULAR | Status: AC
  Filled 2019-03-01: qty 30

## 2019-03-01 MED ORDER — BUPIVACAINE LIPOSOME 1.3 % IJ SUSP
20.0000 mL | INTRAMUSCULAR | Status: AC
  Filled 2019-03-01: qty 20

## 2019-03-01 MED ORDER — CELECOXIB 200 MG PO CAPS
400.0000 mg | ORAL_CAPSULE | ORAL | Status: AC
  Administered 2019-03-01: 400 mg via ORAL

## 2019-03-01 MED ORDER — MIDAZOLAM HCL 2 MG/2ML IJ SOLN
INTRAMUSCULAR | Status: AC
  Filled 2019-03-01: qty 2

## 2019-03-01 MED ORDER — ACETAMINOPHEN 500 MG PO TABS: 1000.0000 mg | ORAL_TABLET | Freq: Once | ORAL | Status: DC | PRN

## 2019-03-01 MED ORDER — ONDANSETRON HCL 4 MG/2ML IJ SOLN
INTRAMUSCULAR | Status: DC | PRN
  Administered 2019-03-01: 4 mg via INTRAVENOUS

## 2019-03-01 MED ORDER — BUPIVACAINE LIPOSOME 1.3 % IJ SUSP
INTRAMUSCULAR | Status: DC | PRN
  Administered 2019-03-01: 20 mL

## 2019-03-01 MED ORDER — SODIUM CHLORIDE 0.9 % IV SOLN
INTRAVENOUS | Status: DC | PRN
  Administered 2019-03-01: 15 mL

## 2019-03-01 MED ORDER — LIDOCAINE 2% (20 MG/ML) 5 ML SYRINGE
INTRAMUSCULAR | Status: DC | PRN
  Administered 2019-03-01: 60 mg via INTRAVENOUS

## 2019-03-01 MED ORDER — SUGAMMADEX SODIUM 200 MG/2ML IV SOLN
INTRAVENOUS | Status: DC | PRN
  Administered 2019-03-01: 200 mg via INTRAVENOUS

## 2019-03-01 MED ORDER — DEXTROSE 5 % IV SOLN
INTRAVENOUS | Status: DC | PRN
  Administered 2019-03-01: 2 g via INTRAVENOUS

## 2019-03-01 MED ORDER — BUPIVACAINE-EPINEPHRINE 0.25% -1:200000 IJ SOLN
INTRAMUSCULAR | Status: DC | PRN
  Administered 2019-03-01: 30 mL

## 2019-03-01 MED ORDER — SODIUM CHLORIDE 0.9 % IR SOLN
Status: DC | PRN
  Administered 2019-03-01 (×2): 1000 mL

## 2019-03-01 MED ORDER — ACETAMINOPHEN 10 MG/ML IV SOLN: 1000.0000 mg | Freq: Once | INTRAVENOUS | Status: DC | PRN

## 2019-03-01 MED ORDER — METHOCARBAMOL 1000 MG/10ML IJ SOLN
1000.0000 mg | Freq: Four times a day (QID) | INTRAVENOUS | Status: DC | PRN
  Filled 2019-03-01: qty 10

## 2019-03-01 MED ORDER — SUCCINYLCHOLINE CHLORIDE 200 MG/10ML IV SOSY
PREFILLED_SYRINGE | INTRAVENOUS | Status: DC | PRN
  Administered 2019-03-01: 120 mg via INTRAVENOUS

## 2019-03-01 MED ORDER — METRONIDAZOLE IN NACL 5-0.79 MG/ML-% IV SOLN
INTRAVENOUS | Status: DC | PRN
  Administered 2019-03-01: 500 mg via INTRAVENOUS

## 2019-03-01 MED ORDER — STERILE WATER FOR IRRIGATION IR SOLN
Status: DC | PRN
  Administered 2019-03-01: 1000 mL

## 2019-03-01 MED ORDER — GLUCAGON HCL RDNA (DIAGNOSTIC) 1 MG IJ SOLR
INTRAMUSCULAR | Status: DC | PRN
  Administered 2019-03-01: 1 mg via INTRAVENOUS

## 2019-03-01 SURGICAL SUPPLY — 48 items
ADH SKN CLS APL DERMABOND .7 (GAUZE/BANDAGES/DRESSINGS) ×1
APL PRP STRL LF DISP 70% ISPRP (MISCELLANEOUS) ×1
APPLIER CLIP 5 13 M/L LIGAMAX5 (MISCELLANEOUS) ×2
APR CLP MED LRG 5 ANG JAW (MISCELLANEOUS) ×1
BAG SPEC RTRVL 10 TROC 200 (ENDOMECHANICALS) ×1
CABLE HIGH FREQUENCY MONO STRZ (ELECTRODE) ×2 IMPLANT
CHLORAPREP W/TINT 26 (MISCELLANEOUS) ×2 IMPLANT
CLIP APPLIE 5 13 M/L LIGAMAX5 (MISCELLANEOUS) ×1 IMPLANT
COVER MAYO STAND STRL (DRAPES) ×2 IMPLANT
COVER SURGICAL LIGHT HANDLE (MISCELLANEOUS) ×2 IMPLANT
COVER WAND RF STERILE (DRAPES) IMPLANT
DECANTER SPIKE VIAL GLASS SM (MISCELLANEOUS) ×2 IMPLANT
DERMABOND ADVANCED (GAUZE/BANDAGES/DRESSINGS) ×1
DERMABOND ADVANCED .7 DNX12 (GAUZE/BANDAGES/DRESSINGS) IMPLANT
DRAIN CHANNEL 19F RND (DRAIN) IMPLANT
DRAPE C-ARM 42X120 X-RAY (DRAPES) ×2 IMPLANT
DRAPE WARM FLUID 44X44 (DRAPE) ×2 IMPLANT
DRSG TEGADERM 4X4.75 (GAUZE/BANDAGES/DRESSINGS) ×2 IMPLANT
ELECT REM PT RETURN 15FT ADLT (MISCELLANEOUS) ×2 IMPLANT
ENDOLOOP SUT PDS II  0 18 (SUTURE) ×1
ENDOLOOP SUT PDS II 0 18 (SUTURE) IMPLANT
EVACUATOR SILICONE 100CC (DRAIN) IMPLANT
GAUZE SPONGE 2X2 8PLY STRL LF (GAUZE/BANDAGES/DRESSINGS) ×1 IMPLANT
GAUZE SPONGE 4X4 12PLY STRL (GAUZE/BANDAGES/DRESSINGS) ×1 IMPLANT
GLOVE ECLIPSE 8.0 STRL XLNG CF (GLOVE) ×2 IMPLANT
GLOVE INDICATOR 8.0 STRL GRN (GLOVE) ×2 IMPLANT
GOWN STRL REUS W/TWL XL LVL3 (GOWN DISPOSABLE) ×4 IMPLANT
IRRIG SUCT STRYKERFLOW 2 WTIP (MISCELLANEOUS) ×2
IRRIGATION SUCT STRKRFLW 2 WTP (MISCELLANEOUS) ×1 IMPLANT
KIT BASIN OR (CUSTOM PROCEDURE TRAY) ×2 IMPLANT
KIT TURNOVER KIT A (KITS) IMPLANT
PAD POSITIONING PINK XL (MISCELLANEOUS) ×2 IMPLANT
POUCH RETRIEVAL ECOSAC 10 (ENDOMECHANICALS) IMPLANT
POUCH RETRIEVAL ECOSAC 10MM (ENDOMECHANICALS) ×1
PROTECTOR NERVE ULNAR (MISCELLANEOUS) ×1 IMPLANT
SCISSORS LAP 5X35 DISP (ENDOMECHANICALS) ×2 IMPLANT
SET CHOLANGIOGRAPH MIX (MISCELLANEOUS) ×2 IMPLANT
SET TUBE SMOKE EVAC HIGH FLOW (TUBING) ×2 IMPLANT
SHEARS HARMONIC ACE PLUS 36CM (ENDOMECHANICALS) ×2 IMPLANT
SPONGE GAUZE 2X2 STER 10/PKG (GAUZE/BANDAGES/DRESSINGS) ×1
SUT MNCRL AB 4-0 PS2 18 (SUTURE) ×2 IMPLANT
SUT PDS AB 1 CT1 27 (SUTURE) ×4 IMPLANT
SYR 20CC LL (SYRINGE) ×2 IMPLANT
TOWEL OR 17X26 10 PK STRL BLUE (TOWEL DISPOSABLE) ×2 IMPLANT
TOWEL OR NON WOVEN STRL DISP B (DISPOSABLE) ×2 IMPLANT
TRAY LAPAROSCOPIC (CUSTOM PROCEDURE TRAY) ×2 IMPLANT
TROCAR BLADELESS OPT 5 100 (ENDOMECHANICALS) ×2 IMPLANT
TROCAR BLADELESS OPT 5 150 (ENDOMECHANICALS) ×2 IMPLANT

## 2019-03-01 NOTE — Anesthesia Procedure Notes (Signed)
Procedure Name: Intubation Date/Time: 03/01/2019 10:08 AM Performed by: Eben Burow, CRNA Pre-anesthesia Checklist: Patient identified, Emergency Drugs available, Suction available, Patient being monitored and Timeout performed Patient Re-evaluated:Patient Re-evaluated prior to induction Oxygen Delivery Method: Circle system utilized Preoxygenation: Pre-oxygenation with 100% oxygen Induction Type: IV induction Ventilation: Mask ventilation without difficulty Laryngoscope Size: Mac and 4 Grade View: Grade I Tube type: Oral Tube size: 7.0 mm Number of attempts: 1 Airway Equipment and Method: Stylet Placement Confirmation: ETT inserted through vocal cords under direct vision,  positive ETCO2 and breath sounds checked- equal and bilateral Secured at: 22 cm Tube secured with: Tape Dental Injury: Teeth and Oropharynx as per pre-operative assessment

## 2019-03-01 NOTE — Op Note (Signed)
03/01/2019  PATIENT:  Stephanie Espinoza  63 y.o. female  Patient Care Team: Hoyt Koch, MD as PCP - General (Internal Medicine) Michel Bickers, MD as PCP - Infectious Diseases (Infectious Diseases) Michael Boston, MD as Consulting Physician (General Surgery)  PRE-OPERATIVE DIAGNOSIS:    Acute on Chronic Calculus Cholecystitis  POST-OPERATIVE DIAGNOSIS:   Acute on Chronic Calculus Cholecystitis Distal common bile duct obstruction most likely due to choledocholithiasis  PROCEDURE:  SINGLE SITE Laparoscopic cholecystectomy with intraoperative cholangiogram  SURGEON:  Adin Hector, MD, FACS.  ASSISTANT: OR Staff   ANESTHESIA:    General with endotracheal intubation Local anesthetic as a field block  EBL:  (See Anesthesia Intraoperative Record) Total I/O In: 0  Out: 20 [Blood:20]  Delay start of Pharmacological VTE agent (>24hrs) due to surgical blood loss or risk of bleeding:  no  DRAINS: None   SPECIMEN: Gallbladder    DISPOSITION OF SPECIMEN:  PATHOLOGY  COUNTS:  YES  PLAN OF CARE: Admit to inpatient   PATIENT DISPOSITION:  PACU - hemodynamically stable.  INDICATION: Pleasant HIV positive woman with episode of crampy abdominal pain and mildly increased liver function tests and gallstones.  Suspicious for acute on chronic cholecystitis.  Mildly elevated liver function test but no major change.  Pain less.  Recommendation made for cholecystectomy with cholangiogram.  The anatomy & physiology of hepatobiliary & pancreatic function was discussed.  The pathophysiology of gallbladder dysfunction was discussed.  Natural history risks without surgery was discussed.   I feel the risks of no intervention will lead to serious problems that outweigh the operative risks; therefore, I recommended cholecystectomy to remove the pathology.  I explained laparoscopic techniques with possible need for an open approach.  Probable cholangiogram to evaluate the bilary tract was  explained as well.    Risks such as bleeding, infection, abscess, leak, injury to other organs, need for further treatment, heart attack, death, and other risks were discussed.  I noted a good likelihood this will help address the problem.  Possibility that this will not correct all abdominal symptoms was explained.  Goals of post-operative recovery were discussed as well.  We will work to minimize complications.  An educational handout further explaining the pathology and treatment options was given as well.  Questions were answered.  The patient expresses understanding & wishes to proceed with surgery.  OR FINDINGS: Early edema with chronic wall changes consistent with acute on chronic cholecystitis.  No empyema.  Cholangiography with classic biliary anatomy.  No massive biliary dilatation but no contrast going into duodenum with some tiny intraluminal hyperlucencies suspicious for cluster of distal common bile duct stones causing distal CBD obstruction.  Not able to be flushed out with irrigation and intravenous glucagon  Liver: normal  DESCRIPTION:   The patient was identified & brought in the operating room. The patient was positioned supine with arms tucked. SCDs were active during the entire case. The patient underwent general anesthesia without any difficulty.  The abdomen was prepped and draped in a sterile fashion. A Surgical Timeout confirmed our plan.  I made a transverse curvilinear incision through the superior umbilical fold.  I placed a 87mm long port through the supraumbilical fascia using a modified Hassan cutdown technique with umbilical stalk fascial countertraction. I began carbon dioxide insufflation.  No change in end tidal CO2 measurement.   Camera inspection revealed no injury. There were no adhesions to the anterior abdominal wall supraumbilically.  I proceeded to continue with single site technique. I  placed a #5 port in left upper aspect of the wound. I placed a 5 mm  atraumatic grasper in the right inferior aspect of the wound.  I turned attention to the right upper quadrant.  Gallbladder had some edema and thickening consistent with acute on chronic cholecystitis.  However did not require needle decompression.  The gallbladder fundus was elevated cephalad. I freed adhesions to the ventral surface of the gallbladder off carefully.  I freed the peritoneal coverings between the gallbladder and the liver on the posteriolateral and anteriomedial walls. I alternated between Harmonic & blunt Maryland dissection to help get a good critical view of the cystic artery and cystic duct.  did further dissection to free 50%of the gallbladder off the liver bed to get a good critical view of the infundibulum and cystic duct. I dissected out the cystic artery; and, after getting a good 360 view, ligated the anterior & posterior branches of the cystic artery close on the infundibulum using the Harmonic ultrasonic dissection.  I skeletonized the cystic duct.  I placed a clip on the infundibulum. I did a partial cystic duct-otomy and ensured patency. I placed a 5 [place] cholangiocatheter through a puncture site at the right subcostal ridge of the abdominal wall and directed it into the cystic duct.  While fluoroscopy arrived and set up I freed the gallbladder from its meaning attachments on the liver bed.  Hemostasis was good on the gallbladder fossa of the liver.  We ran a cholangiogram with dilute radio-opaque contrast and continuous fluoroscopy. Contrast flowed from a side branch consistent with cystic duct cannulization. Contrast flowed up the common hepatic duct into the right and left intrahepatic chains out to secondary radicals. Contrast flowed down the common bile duct easily.  Contrast stopped off in a V like taper that would not carry across the ampulla into the duodenum.  No reverse meniscus sign but very small hyperlucencies suspicious for a cluster of distal common bile duct  stones impacted.  I flushed with isotonic solution.  Anesthesia gave intravenous glucagon.  Flushed again and cholangiogram repeated.  Persistent distal common bile duct obstruction.  I removed the cholangiocatheter.   I lassoed around the gallbladder and ligated the cystic duct proximally with a 0 PDS Endoloop.  I placed clips on the cystic duct x4.  I completed cystic duct transection. I ensured hemostasis on the gallbladder fossa of the liver and elsewhere. I inspected the rest of the abdomen & detected no injury nor bleeding elsewhere.  Placed the gallbladder inside and eco-sac.  I removed the gallbladder out the supraumbilical fascia.  Did have to open up to 2 cm to get the thickened gallbladder out.  I closed the fascia transversely using  #1 PDS interrupted stitches. I closed the skin using 4-0 monocryl stitch.  Sterile dressing was applied. The patient was extubated & arrived in the PACU in stable condition.[facility] for consultation.  Dr. Paulita Fujita on call.  Will order labs for tomorrow.  I suspect patient would benefit from ERCP to clear out the impacted distal common bile duct stones.  They may spontaneously pass.  Defer to their expertise.  NPO after midnight.  At the very least monitored.  I had discussed postoperative care with the patient in the holding area. I discussed operative findings, updated the patient's status, discussed probable steps to recovery, and gave postoperative recommendations to the Patient's son, Ernestina Patches.  [phone].  Recommendations were made.  Questions were answered.  He expressed  understanding & appreciation.  Adin Hector, M.D., F.A.C.S. Gastrointestinal and Minimally Invasive Surgery [facility] [address] [place], [place] [address] [phone] Main / Paging  03/01/2019 11:47 AM

## 2019-03-01 NOTE — Interval H&P Note (Signed)
History and Physical Interval Note:  03/01/2019 9:43 AM  Stephanie Espinoza  has presented today for surgery, with the diagnosis of cholecystitis.  The various methods of treatment have been discussed with the patient and family. After consideration of risks, benefits and other options for treatment, the patient has consented to  Procedure(s): [place] (N/A) as a surgical intervention.  The patient's history has been reviewed, patient examined, no change in status, stable for surgery.  I have reviewed the patient's chart and labs.  Questions were answered to the patient's satisfaction.    I have re-reviewed the the patient's records, history, medications, and allergies.  I have re-examined the patient.  I again discussed intraoperative plans and goals of post-operative recovery.  The patient agrees to proceed.  Stephanie Espinoza  [birth year] [id number]  Patient Care Team: Hoyt Koch, MD as PCP - General (Internal Medicine) Michel Bickers, MD as PCP - Infectious Diseases (Infectious Diseases)  Patient Active Problem List   Diagnosis Date Noted  . Human immunodeficiency virus (HIV) disease ([place]) 09/06/2006    Priority: Medium  . Acute on chronic cholecystitis 03/01/2019  . Elevated liver enzymes 02/28/2019  . Headache 06/30/2018  . Salivary gland adenitis 02/18/2018  . Sciatica 05/26/2017  . 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 Low back pain 06/12/2016  . Essential hypertension 09/16/2014  . 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 Sinusitis 03/22/2009  . GENITAL HERPES 09/06/2006  . SYPHILIS NOS 09/06/2006  . Depression 09/06/2006  . PAP SMEAR, ABNORMAL 09/06/2006    Past Medical History:  Diagnosis Date  . Abnormal Pap smear of cervix before 1995   always a repeat back to normal except for 1 time and had colpo bioosy -resutls were normal  . Allergic rhinitis   . Anxiety   . Bell's palsy after 1995 ?   no  residual SE.  [name] Depression   . Genital herpes   . History of drug abuse ([place])    drug usser [place].  cocaine  . HIV (human immunodeficiency virus infection) ([place]) 1990   from sexual contact who is unknown  . Syphilis    treated    Past Surgical History:  Procedure Laterality Date  . CESAREAN SECTION  1997    Social History   Socioeconomic History  . Marital status: Divorced    Spouse name: Not on file  . Number of children: Not on file  . Years of education: Not on file  . Highest education level: Not on file  Occupational History  . Not on file  Social Needs  . Financial resource strain: Not on file  . Food insecurity:    Worry: Not on file    Inability: Not on file  . Transportation needs:    Medical: Not on file    Non-medical: Not on file  Tobacco Use  . Smoking status: Former Smoker    Types: Cigarettes    Last attempt to quit: 03/01/1990    Years since quitting: 29.0  . Smokeless tobacco: Never Used  Substance and Sexual Activity  . Alcohol use: No  . Drug use: No  . Sexual activity: Yes    Birth control/protection: Post-menopausal    Comment: condoms  Lifestyle  . Physical activity:    Days per week: Not on file    Minutes per session: Not on file  . Stress: Not on file  Relationships  . Social connections:    Talks  on phone: Not on file    Gets together: Not on file    Attends religious service: Not on file    Active member of club or organization: Not on file    Attends meetings of clubs or organizations: Not on file    Relationship status: Not on file  . Intimate partner violence:    Fear of current or ex partner: Not on file    Emotionally abused: Not on file    Physically abused: Not on file    Forced sexual activity: Not on file  Other Topics Concern  . Not on file  Social History Narrative  . Not on file    Family History  Problem Relation Age of Onset  . Diabetes Father   . Breast cancer Other     Medications Prior to  Admission  Medication Sig Dispense Refill Last Dose  . albuterol (PROVENTIL HFA;VENTOLIN HFA) 108 (90 Base) MCG/ACT inhaler Inhale 2 puffs into the lungs every 6 (six) hours as needed for wheezing or shortness of breath. 1 Inhaler 2 Taking  . valACYclovir (VALTREX) 500 MG tablet 1 po q day and increase to bid x 3 days with symptoms. 90 tablet 2 Taking    Current Facility-Administered Medications  Medication Dose Route Frequency Provider Last Rate Last Dose  . 0.9 %  sodium chloride infusion   Intravenous Continuous Dahal, Binaya, MD 100 mL/hr at 03/01/19 0600    . [MAR Hold] acetaminophen (TYLENOL) tablet 650 mg  650 mg Oral Q6H PRN Dahal, Marlowe Aschoff, MD       Or  . Doug Sou Hold] acetaminophen (TYLENOL) suppository 650 mg  650 mg Rectal Q6H PRN Dahal, Marlowe Aschoff, MD      . Doug Sou Hold] cefTRIAXone (ROCEPHIN) 2 g in sodium chloride 0.9 % 100 mL IVPB  2 g Intravenous On Call to OR Earnstine Regal, PA-C      . lactated ringers infusion   Intravenous Continuous Oleta Mouse, MD 50 mL/hr at 03/01/19 [phone]    . [MAR Hold] magnesium hydroxide (MILK OF MAGNESIA) suspension 30 mL  30 mL Oral Daily PRN Dahal, Marlowe Aschoff, MD      . Doug Sou Hold] ondansetron (ZOFRAN) tablet 4 mg  4 mg Oral Q6H PRN Dahal, Marlowe Aschoff, MD       Or  . Doug Sou Hold] ondansetron (ZOFRAN) injection 4 mg  4 mg Intravenous Q6H PRN Dahal, Marlowe Aschoff, MD      . Mikaela.Ping Hold] oxyCODONE (Oxy IR/ROXICODONE) immediate release tablet 5 mg  5 mg Oral Q4H PRN Dahal, Marlowe Aschoff, MD   5 mg at 02/28/19 1706  . [MAR Hold] senna (SENOKOT) tablet 8.6 mg  1 tablet Oral BID Terrilee Croak, MD   Stopped at 03/01/19 0920  . [MAR Hold] sodium chloride flush (NS) 0.9 % injection 3 mL  3 mL Intravenous Q12H Dahal, Binaya, MD   3 mL at 02/28/19 1446  . [MAR Hold] sodium phosphate (FLEET) 7-19 GM/118ML enema 1 enema  1 enema Rectal Once PRN Dahal, Marlowe Aschoff, MD      . Doug Sou Hold] sorbitol 70 % solution 30 mL  30 mL Oral Daily PRN Terrilee Croak, MD         Allergies  Allergen Reactions   . Haldol [Haloperidol] Other (See Comments)    Wipes out her memory  . Nevirapine     REACTION: Rash 10/08  . Thorazine [Chlorpromazine] Other (See Comments)    "makes me comatose"    BP (!) 188/92   Pulse 62  Temp 98.9 F (37.2 C) (Oral)   Resp 16   Ht 5\' 2"  (1.575 m)   Wt 83.9 kg   LMP 11/04/1997   SpO2 95%   BMI 33.84 kg/m   Labs: Results for orders placed or performed during the hospital encounter of 02/28/19 (from the past 48 hour(s))  Comprehensive metabolic panel     Status: Abnormal   Collection Time: 02/28/19  9:51 AM  Result Value Ref Range   Sodium 134 (L) 135 - 145 mmol/L   Potassium 3.9 3.5 - 5.1 mmol/L   Chloride 105 98 - 111 mmol/L   CO2 21 (L) 22 - 32 mmol/L   Glucose, Bld 123 (H) 70 - 99 mg/dL   BUN 12 6 - 20 mg/dL   Creatinine, Ser 0.97 0.44 - 1.00 mg/dL   Calcium 8.6 (L) 8.9 - 10.3 mg/dL   Total Protein 8.0 6.5 - 8.1 g/dL   Albumin 3.5 3.5 - 5.0 g/dL   AST 709 (H) 15 - 41 U/L   ALT 313 (H) 0 - 44 U/L   Alkaline Phosphatase 186 (H) 38 - 126 U/L   Total Bilirubin 1.3 (H) 0.3 - 1.2 mg/dL   GFR calc non Af Amer >60 >60 mL/min   GFR calc Af Amer >60 >60 mL/min   Anion gap 8 5 - 15    Comment: Performed at [facility], [place] [address], [place], [place] [address]  Lipase, blood     Status: None   Collection Time: 02/28/19  9:51 AM  Result Value Ref Range   Lipase 45 11 - 51 U/L    Comment: Performed at [facility], [place] [address], [place], [place] [address]  CBC with Differential     Status: Abnormal   Collection Time: 02/28/19  9:51 AM  Result Value Ref Range   WBC 6.6 4.0 - 10.5 K/uL   RBC 4.19 3.87 - 5.11 MIL/uL   Hemoglobin 11.7 (L) 12.0 - 15.0 g/dL   HCT 37.8 36.0 - 46.0 %   MCV 90.2 80.0 - 100.0 fL   MCH 27.9 26.0 - 34.0 pg   MCHC 31.0 30.0 - 36.0 g/dL   RDW 12.9 11.5 - 15.5 %   Platelets 201 150 - 400 K/uL   nRBC 0.0 0.0 - 0.2 %   Neutrophils Relative % 68 %   Neutro Abs 4.4 1.7 - 7.7 K/uL    Lymphocytes Relative 27 %   Lymphs Abs 1.8 0.7 - 4.0 K/uL   Monocytes Relative 5 %   Monocytes Absolute 0.3 0.1 - 1.0 K/uL   Eosinophils Relative 0 %   Eosinophils Absolute 0.0 0.0 - 0.5 K/uL   Basophils Relative 0 %   Basophils Absolute 0.0 0.0 - 0.1 K/uL   Immature Granulocytes 0 %   Abs Immature Granulocytes 0.02 0.00 - 0.07 K/uL    Comment: Performed at [facility], [place] [address] [place], [place] [address]  Urinalysis, Routine w reflex microscopic     Status: Abnormal   Collection Time: 02/28/19  9:52 AM  Result Value Ref Range   Color, Urine YELLOW YELLOW   APPearance HAZY (A) CLEAR   Specific Gravity, Urine 1.018 1.005 - 1.030   pH 7.0 5.0 - 8.0   Glucose, UA NEGATIVE NEGATIVE mg/dL   Hgb urine dipstick NEGATIVE NEGATIVE   Bilirubin Urine NEGATIVE NEGATIVE   Ketones, ur NEGATIVE NEGATIVE mg/dL   Protein, ur 30 (A) NEGATIVE mg/dL   Nitrite NEGATIVE  NEGATIVE   Leukocytes,Ua LARGE (A) NEGATIVE   RBC / HPF 6-10 0 - 5 RBC/hpf   WBC, UA 21-50 0 - 5 WBC/hpf   Bacteria, UA RARE (A) NONE SEEN   Squamous Epithelial / LPF 11-20 0 - 5   Mucus PRESENT     Comment: Performed at [facility], [place] [address], [place], [place] [address]  Basic metabolic panel     Status: Abnormal   Collection Time: 03/01/19  3:44 AM  Result Value Ref Range   Sodium 140 135 - 145 mmol/L   Potassium 3.7 3.5 - 5.1 mmol/L   Chloride 111 98 - 111 mmol/L   CO2 22 22 - 32 mmol/L   Glucose, Bld 90 70 - 99 mg/dL   BUN 9 6 - 20 mg/dL   Creatinine, Ser 0.98 0.44 - 1.00 mg/dL   Calcium 8.3 (L) 8.9 - 10.3 mg/dL   GFR calc non Af Amer >60 >60 mL/min   GFR calc Af Amer >60 >60 mL/min   Anion gap 7 5 - 15    Comment: Performed at [facility], [place] [address], [place], [place] [address]  CBC     Status: Abnormal   Collection Time: 03/01/19  3:44 AM  Result Value Ref Range   WBC 4.0 4.0 - 10.5 K/uL   RBC 3.82 (L) 3.87 - 5.11 MIL/uL   Hemoglobin 10.5  (L) 12.0 - 15.0 g/dL   HCT 34.8 (L) 36.0 - 46.0 %   MCV 91.1 80.0 - 100.0 fL   MCH 27.5 26.0 - 34.0 pg   MCHC 30.2 30.0 - 36.0 g/dL   RDW 13.1 11.5 - 15.5 %   Platelets 189 150 - 400 K/uL   nRBC 0.0 0.0 - 0.2 %    Comment: Performed at [facility], [place] [address], [place], [place] [address]  Hepatic function panel     Status: Abnormal   Collection Time: 03/01/19  3:44 AM  Result Value Ref Range   Total Protein 7.0 6.5 - 8.1 g/dL   Albumin 3.0 (L) 3.5 - 5.0 g/dL   AST 643 (H) 15 - 41 U/L   ALT 530 (H) 0 - 44 U/L   Alkaline Phosphatase 224 (H) 38 - 126 U/L   Total Bilirubin 1.6 (H) 0.3 - 1.2 mg/dL   Bilirubin, Direct 0.5 (H) 0.0 - 0.2 mg/dL   Indirect Bilirubin 1.1 (H) 0.3 - 0.9 mg/dL    Comment: Performed at [facility], [place] [address] [place], [place] [address]    Imaging / Studies: Ct Abdomen Pelvis W Contrast  Result Date: 02/28/2019 CLINICAL DATA:  63 year old with acute onset generalized abdominal pain and nausea that began at 2 o'clock this morning. Current history of treated HIV. Surgical history includes cesarean section. EXAM: CT ABDOMEN AND PELVIS WITH CONTRAST TECHNIQUE: Multidetector CT imaging of the abdomen and pelvis was performed using the standard protocol following bolus administration of intravenous contrast. CONTRAST:  184mL OMNIPAQUE IOHEXOL 300 MG/ML IV. COMPARISON:  None. FINDINGS: Lower chest: Mild atelectasis and/or scarring involving the BILATERAL lower lobes, LEFT greater than RIGHT, and the lingula. Heart size upper normal. Hepatobiliary: Liver normal in size and appearance. Mild gallbladder wall thickening. No calcified gallstones. No convincing pericholecystic edema/inflammation. No biliary ductal dilation. Pancreas: Normal in appearance without evidence of mass, ductal dilation, or inflammation. Spleen: Normal in size and appearance. Adrenals/Urinary Tract: Normal appearing adrenal glands. Subcentimeter benign cortical  cyst involving the LOWER pole the  RIGHT kidney. No significant parenchymal abnormalities involving either kidney. No hydronephrosis. No urinary tract calculi. Normal appearing urinary bladder. Stomach/Bowel: Gas-filled diverticulum arising from the distal esophagus near the EG junction. Stomach normal in appearance for the degree of distention. Normal-appearing small bowel. Extensive diffuse colonic diverticulosis without evidence of acute diverticulitis. Normal appendix in the RIGHT UPPER pelvis. Vascular/Lymphatic: Mild aortoiliac atherosclerosis without evidence of aneurysm. Normal-appearing portal venous and systemic venous systems. No pathologic lymphadenopathy. Numerous normal sized lymph nodes throughout the mesentery and retroperitoneum. Reproductive: Normal-appearing uterus and ovaries without evidence of adnexal mass. Other: None. Musculoskeletal: Broad-based central disc protrusion at L5-S1 and facet degenerative changes at this level. No acute findings. IMPRESSION: 1. Mild gallbladder wall thickening without evidence of calcified gallstones or pericholecystic edema/inflammation. Acalculous cholecystitis or chronic cholecystitis might have this appearance. RIGHT UPPER QUADRANT abdominal ultrasound may be helpful to confirm or deny this finding. 2. No acute abnormalities otherwise involving the abdomen or pelvis. 3. Diffuse colonic diverticulosis without evidence of acute diverticulitis. 4. Gas-filled diverticulum arising from the distal esophagus near the EG junction. Aortic Atherosclerosis (ICD10-170.0) Electronically Signed   By: Evangeline Dakin M.D.   On: 02/28/2019 13:38   US Abdomen Limited Ruq  Result Date: 02/28/2019 CLINICAL DATA:  RIGHT upper quadrant pain. EXAM: ULTRASOUND ABDOMEN LIMITED RIGHT UPPER QUADRANT COMPARISON:  None. FINDINGS: Gallbladder: Multiple shadowing gallstones and sludge. Difficult to measure a discrete calculus single dimension. Wall thickness 2.0 mm. Negative  sonographic Murphy's sign. Common bile duct: Diameter: Normal, 5 mm. Liver: No focal lesion identified. Within normal limits in parenchymal echogenicity. Portal vein is patent on color Doppler imaging with normal direction of blood flow towards the liver. IMPRESSION: Cholelithiasis without sonographic features of acute cholecystitis. Electronically Signed   By: Staci Righter M.D.   On: 02/28/2019 10:50     .Adin Hector, M.D., F.A.C.S. Gastrointestinal and Minimally Invasive Surgery [facility] [address], [place], [place] [address] [phone] Main / Paging  03/01/2019 9:43 AM    Adin Hector

## 2019-03-01 NOTE — H&P (View-Only) (Signed)
CC: Epigastric/right upper quadrant pain with nausea  Subjective: Patient is not having any pain this morning.  LFTs are up slightly but she is not symptomatic.    Objective: Vital signs in last 24 hours: Temp:  [96 F (35.6 C)-98.9 F (37.2 C)] 96 F (35.6 C) (04/27 0450) Pulse Rate:  [61-74] 61 (04/27 0450) Resp:  [16-18] 16 (04/27 0450) BP: (147-180)/(82-101) 156/84 (04/27 0450) SpO2:  [92 %-99 %] 94 % (04/27 0450) Weight:  [83.9 kg] 83.9 kg (04/26 1807) Last BM Date: 02/26/19 N.p.o. 1226 IV Voided this a.m. x1 Emesis x1 Afebrile blood pressures elevated 180/88 -156/84 last 2 blood pressures. Labs show WBC is normal at 4.0.  Total bilirubin is 1.6 ALT is 530 AST 643 alk phos 224. CMP Latest Ref Rng & Units 03/01/2019 02/28/2019 01/07/2019  Glucose 70 - 99 mg/dL 90 123(H) 95  BUN 6 - 20 mg/dL 9 12 16   Creatinine 0.44 - 1.00 mg/dL 0.98 0.97 1.11(H)  Sodium 135 - 145 mmol/L 140 134(L) 140  Potassium 3.5 - 5.1 mmol/L 3.7 3.9 4.0  Chloride 98 - 111 mmol/L 111 105 106  CO2 22 - 32 mmol/L 22 21(L) 29  Calcium 8.9 - 10.3 mg/dL 8.3(L) 8.6(L) 9.5  Total Protein 6.5 - 8.1 g/dL 7.0 8.0 7.3  Total Bilirubin 0.3 - 1.2 mg/dL 1.6(H) 1.3(H) 0.8  Alkaline Phos 38 - 126 U/L 224(H) 186(H) -  AST 15 - 41 U/L 643(H) 709(H) 18  ALT 0 - 44 U/L 530(H) 313(H) 13    Intake/Output from previous day: 04/26 0701 - 04/27 0700 In: 1262.6 [I.V.:1262.6] Out: -  Intake/Output this shift: No intake/output data recorded.  General appearance: alert, cooperative and no distress Resp: clear to auscultation bilaterally GI: soft, non-tender; bowel sounds normal; no masses,  no organomegaly  Lab Results:  Recent Labs    02/28/19 0951 03/01/19 0344  WBC 6.6 4.0  HGB 11.7* 10.5*  HCT 37.8 34.8*  PLT 201 189    BMET Recent Labs    02/28/19 0951 03/01/19 0344  NA 134* 140  K 3.9 3.7  CL 105 111  CO2 21* 22  GLUCOSE 123* 90  BUN 12 9  CREATININE 0.97 0.98  CALCIUM 8.6* 8.3*    PT/INR No results for input(s): LABPROT, INR in the last 72 hours.  Recent Labs  Lab 02/28/19 0951 03/01/19 0344  AST 709* 643*  ALT 313* 530*  ALKPHOS 186* 224*  BILITOT 1.3* 1.6*  PROT 8.0 7.0  ALBUMIN 3.5 3.0*     Lipase     Component Value Date/Time   LIPASE 45 02/28/2019 0951     Medications: . senna  1 tablet Oral BID  . sodium chloride flush  3 mL Intravenous Q12H   . sodium chloride 100 mL/hr at 03/01/19 0600   Anti-infectives (From admission, onward)   None     Prior to Admission medications   Medication Sig Start Date End Date Taking? Authorizing Provider  albuterol (PROVENTIL HFA;VENTOLIN HFA) 108 (90 Base) MCG/ACT inhaler Inhale 2 puffs into the lungs every 6 (six) hours as needed for wheezing or shortness of breath. 05/27/16  Yes Hoyt Koch, MD  valACYclovir (VALTREX) 500 MG tablet 1 po q day and increase to bid x 3 days with symptoms. 11/06/18  Yes Megan Salon, MD     Assessment/Plan HIV - not currently on RX Hypertension  Symptomatic cholelithiasis/possible choledocholithiasis. Elevated LFT's/normal WBC  FEN: IV fluids/NPO ID: none:  Rocephin pre op  DVT:  SCD Follow up:  [facility] Contact: Son Elicia Lamp [phone]  Plan:  We discussed going forward with laparoscopic cholecystectomy with IOC.  I discussed the risk and benefits with her, I also talked to her son on the phone.  She is ready to move forward with surgery today. She is on no home medicines, Valacyclovir is as needed.      LOS: 0 days    Jason Frisbee 03/01/2019 [phone]

## 2019-03-01 NOTE — Plan of Care (Signed)
Aware of GI consult; patient off floor (for cholecystectomy) when I came by to see her.  Getting IOC; if positive for choledocholithiasis, would advise ERCP this admission.  Please call us back as needed.  Thanks.

## 2019-03-01 NOTE — Progress Notes (Signed)
Aware of IOC findings, no passage of contrast into duodenum, which could represent bile duct stone or ampullary spasm.  I will order MRCP and follow LFT trend, prior to consideration of ERCP.  Can keep NPO after midnight tonight just in case we might need to do ERCP tomorrow.

## 2019-03-01 NOTE — Progress Notes (Signed)
PROGRESS NOTE    LE[facility]ORE  VZD:[id number] DOB: 28-Jul-1956 DOA: 02/28/2019 PCP: Hoyt Koch, MD  Brief Narrative:  HPI per Dr. Terrilee Croak on 02/28/2019 Stephanie Espinoza is a 63 y.o. female with HIV, hypertension who presented to an urgent care this morning with complaint of abdominal pain and nausea since 2 AM this morning.  Denies vomiting or diarrhea.  No fever.  Pain localized right upper quadrant, no relieving factor.  Worsens with certain movements and palpation.  She was sent to the ED from urgent care. In the ED, patient was afebrile, heart rate in 60s and 70s, breathing comfortably on room air, blood pressure elevated mostly up to 167/95. Labs showed WBC count normal at 6.6, hemoglobin 11.7, platelet 201, sodium level slightly low at 134, potassium 3.9, BUN/creatinine 12/0.97, liver enzymes with alk phos/AST/ALT/total bili respectively elevated to 186/709/313/1.3.  CT abdomen pelvis showed mild gallbladder wall thickening without evidence of calcified gallstones or pericholecystic edema/inflammation suggestive acalculous cholecystitis or chronic cholecystitis. Ultrasound abdomen pelvis showed cholelithiasis without sonographic features of acute cholecystitis.   **Interim History General surgery evaluated and took the patient for surgical intervention this morning and she had a laparoscopic cholecystectomy with intraoperative cholangiogram.  The gallbladder showed early edema with chronic wall changes consistent with acute on chronic cholecystitis with no empyema and then cholangiography was done there is no massive biliary dilatation but there is no contrast going into the duodenum with tiny intraluminal hyperlucency suspicious for questionable distal common bile duct stones causing a distal common bile duct obstruction.  Patient was returned to the medical floor and GI ordering a MRCP to be done with possible ERCP tomorrow.  Assessment & Plan:   Principal Problem:   Common  bile duct stones Active Problems:   Human immunodeficiency virus (HIV) disease ([place])   Essential hypertension   Elevated liver enzymes   Acute on chronic cholecystitis   Normocytic anemia   Hyperglycemia   Obesity (BMI 30.0-34.9)   Choledocholithiasis  Abdominal pain in the setting of Acute on Chronic Cholecystitis with Abnormal LFTs from suspected Choledocholithiasis and Cholelithiasis,  Hyperbilirubinemia with TBili of 1.6 -Right upper quadrant pain associated with nausea and elevated liver enzymes. -Liver enzymes with alk phos/AST/ALT/total bili respectively elevated to 186/709/313/1.3. Now Alk Phos is 224, AST is 643, ALT is 530, and T Bili is 1.6 -CT Abdomen/Pelvis showed "Mild gallbladder wall thickening without evidence of calcified gallstones or pericholecystic edema/inflammation. Acalculous cholecystitis or chronic cholecystitis might have this appearance. No acute abnormalities otherwise involving the abdomen or pelvis. Diffuse colonic diverticulosis without evidence of acute diverticulitis. Gas-filled diverticulum arising from the distal esophagus near the EG junction." -Ultrasound showed cholelithiasis without evidence of acute cholecystitis.  No CBD dilation noted. -General surgery has been called from the ED and per ED physician, they recommended to get GI involved and Gastroenterology was consulted -Prior to being seen patient was taken for Laparoscopic Surgical Intervention with IOC and is s/p Cholecystectomy -IOC Done showed "No passage of contrast into the duodenum suggesting obstructing distal common duct stone." -GI evaluating and recommending MRCP and possible ERCP tomorrow -Acute Hepatitis Panel is pending -Continue to Monitor and Trend LFT's and Bilirubin -Repeat CMP in AM -Patient received IV Ceftriaxone for perioperative antibiotics -Control with diminishing 9000 mg in the OR as well as acetaminophen 650 p.o./RC every 6 PRN for mild pain, methocarbamol thousand  milligrams p.o. every 6 as needed muscle spasms, oxycodone IR 5 mg p.o. every 4 as needed moderate pain -Started  bowel regimen with senna 8.6 mg p.o. twice daily and 7 Fleet enema RC PRN for severe constipation as well as sorbitol 70% solution 30 mL's p.o. daily as needed moderate constipation -Continue with supportive care and continue with IV fluid hydration with NS at rate 100 mL per hour as well as antiemetics with Zofran 4 mg p.o/IV every 6 as needed.nausea and vomiting -Start Pharmocological DVT Prophylaxis when ok with Surgery and GI  HIV -Patient states being HIV positive for last 30 years.   -Not on any antiretroviral treatment.   -Follows up with Infectious Disease on a regular basis -Has a Hx of Abnormal Pap Smear and Hx of Genital Herpes and Syphilis  Hypertension -No other medicines at home.  Blood pressure mostly 150s and 160s at this time.  Monitor blood pressure trend.  Normocytic Anemia -Patient's Hb/Hct went from 11.7/37.8 -> 10.5/34.8 -Check Anemia Panel in AM -Likely Dilutional Drop and expect to drop further from Surgical Intervention -Continue to Monitor for S/Sx of Bleeding -Repeat CBC in AM   Hyperglycemia -Patient's Blood Sugar on Admission was 123 and repeat on Glucose on CMP this AM was 90 -CBG was 128 -Check HbA1c -Continue to Monitor CBGs carefully and If Blood Sugars remain consistently elevated will need to place on Sensitive Novolog AC  Hx of Bell's Palsy -Has had no Residual Symptoms  Obesity -Estimated body mass index is 33.84 kg/m as calculated from the following:   Height as of this encounter: _0  (1.575 m).   Weight as of this encounter: 83.9 kg., -Weight Loss and Dietary Counseling Given  DVT prophylaxis: SCDs as patient went for Surgical Intervention Code Status: DO NOT RESUSCITATE  Family Communication: No family present at bedside  Disposition Plan: Remain Inpatient for Continued Workup and Treatment   Consultants:   General  Surgery  [facility]    Procedures:  SINGLE SITE Laparoscopic cholecystectomy with intraoperative cholangiogram Done by Dr. Johney Maine on 03/01/2019  MRCP ordered and pending   Antimicrobials: Anti-infectives (From admission, onward)   Start     Dose/Rate Route Frequency Ordered Stop   03/01/19 1045  metroNIDAZOLE (FLAGYL) 5-0.79 MG/ML-% IVPB    Note to Pharmacy:  Randa Evens  : cabinet override      03/01/19 1045 03/01/19 1048   03/01/19 0915  cefTRIAXone (ROCEPHIN) 2 g in sodium chloride 0.9 % 100 mL IVPB     2 g 200 mL/hr over 30 Minutes Intravenous On call to O.R. 03/01/19 4782 03/02/19 0559     Subjective: Seen and Examined At bedside after surgical intervention and states that her abdomen was little sore.  No nausea or vomiting.  Was feeling hungry.  Denying chest pain and no other concerns or complaints at this time.  Objective: Vitals:   03/01/19 1250 03/01/19 1359 03/01/19 1403 03/01/19 1450  BP: (!) 158/89 (!) 150/92  (!) 154/93  Pulse: 62 63  65  Resp: _1 Temp: 97.7 F (36.5 C) 98.2 F (36.8 C)  97.8 F (36.6 C)  TempSrc: Oral   Oral  SpO2: 94% 96% 96% 98%  Weight:      Height:        Intake/Output Summary (Last 24 hours) at 03/01/2019 1606 Last data filed at 03/01/2019 1543 Gross per 24 hour  Intake 2930.07 ml  Output 720 ml  Net 2210.07 ml   Filed Weights   02/28/19 1807  Weight: 83.9 kg   Examination: Physical Exam:  Constitutional: WN/WD obese Female in NAD  and appears calm and mildly uncomfortable Eyes: Lids and conjunctivae normal, sclerae anicteric  ENMT: External Ears, Nose appear normal. Grossly normal hearing. Mucous membranes are moist. Neck: Appears normal, supple, no cervical masses, normal ROM, no appreciable thyromegaly; no JVD Respiratory: Diminished to auscultation bilaterally, no wheezing, rales, rhonchi or crackles. Normal respiratory effort and patient is not tachypenic. No accessory muscle use.    Cardiovascular: RRR, no murmurs / rubs / gallops. S1 and S2 auscultated. No appreciable extremity edema.  Abdomen: Soft, Mildly tender, Distended 2/2 body habiuts. Bowel sounds positive x3. Has a single abdominal incision in the navel which was C/D/I GU: Deferred. Musculoskeletal: No clubbing / cyanosis of digits/nails. No joint deformity upper and lower extremities  Skin: No rashes, lesions, ulcers on a limited skin evaluation. No induration; Warm and dry.  Neurologic: CN 2-12 grossly intact with no focal deficits. Romberg sign and cerebellar reflexes not assessed.  Psychiatric: Normal judgment and insight. Alert and oriented x 3. Normal mood and appropriate affect.   Data Reviewed: I have personally reviewed following labs and imaging studies  CBC: Recent Labs  Lab 02/28/19 0951 03/01/19 0344  WBC 6.6 4.0  NEUTROABS 4.4  --   HGB 11.7* 10.5*  HCT 37.8 34.8*  MCV 90.2 91.1  PLT 201 532   Basic Metabolic Panel: Recent Labs  Lab 02/28/19 0951 03/01/19 0344  NA 134* 140  K 3.9 3.7  CL 105 111  CO2 21* 22  GLUCOSE 123* 90  BUN 12 9  CREATININE 0.97 0.98  CALCIUM 8.6* 8.3*   GFR: Estimated Creatinine Clearance: 61.3 mL/min (by C-G formula based on SCr of 0.98 mg/dL). Liver Function Tests: Recent Labs  Lab 02/28/19 0951 03/01/19 0344  AST 709* 643*  ALT 313* 530*  ALKPHOS 186* 224*  BILITOT 1.3* 1.6*  PROT 8.0 7.0  ALBUMIN 3.5 3.0*   Recent Labs  Lab 02/28/19 0951  LIPASE 45   No results for input(s): AMMONIA in the last 168 hours. Coagulation Profile: No results for input(s): INR, PROTIME in the last 168 hours. Cardiac Enzymes: No results for input(s): CKTOTAL, CKMB, CKMBINDEX, TROPONINI in the last 168 hours. BNP (last 3 results) No results for input(s): PROBNP in the last 8760 hours. HbA1C: No results for input(s): HGBA1C in the last 72 hours. CBG: Recent Labs  Lab 03/01/19 1159  GLUCAP 128*   Lipid Profile: No results for input(s): CHOL, HDL,  LDLCALC, TRIG, CHOLHDL, LDLDIRECT in the last 72 hours. Thyroid Function Tests: No results for input(s): TSH, T4TOTAL, FREET4, T3FREE, THYROIDAB in the last 72 hours. Anemia Panel: No results for input(s): VITAMINB12, FOLATE, FERRITIN, TIBC, IRON, RETICCTPCT in the last 72 hours. Sepsis Labs: No results for input(s): PROCALCITON, LATICACIDVEN in the last 168 hours.  No results found for this or any previous visit (from the past 240 hour(s)).    Radiology Studies: Dg Cholangiogram Operative  Result Date: 03/01/2019 CLINICAL DATA:  Operative cholangiogram for cholecystitis, 28 sec fluoro time EXAM: INTRAOPERATIVE CHOLANGIOGRAM TECHNIQUE: Cholangiographic images from the C-arm fluoroscopic device were submitted for interpretation post-operatively. Please see the procedural report for the amount of contrast and the fluoroscopy time utilized. COMPARISON:  CT from the previous day FINDINGS: No mobile filling defects in the common duct. Intrahepatic ducts are incompletely visualized, appearing mildly distended centrally. There is a subtle meniscus in the distal CBD. No contrast is seen to pass into the duodenum. No opacification of the pancreatic duct. : 1. No passage of contrast into the duodenum suggesting obstructing  distal common duct stone. Electronically Signed   By: Lucrezia Europe M.D.   On: 03/01/2019 11:51   Ct Abdomen Pelvis W Contrast  Result Date: 02/28/2019 CLINICAL DATA:  62 year old with acute onset generalized abdominal pain and nausea that began at 2 o'clock this morning. Current history of treated HIV. Surgical history includes cesarean section. EXAM: CT ABDOMEN AND PELVIS WITH CONTRAST TECHNIQUE: Multidetector CT imaging of the abdomen and pelvis was performed using the standard protocol following bolus administration of intravenous contrast. CONTRAST:  146m OMNIPAQUE IOHEXOL 300 MG/ML IV. COMPARISON:  None. FINDINGS: Lower chest: Mild atelectasis and/or scarring involving the BILATERAL  lower lobes, LEFT greater than RIGHT, and the lingula. Heart size upper normal. Hepatobiliary: Liver normal in size and appearance. Mild gallbladder wall thickening. No calcified gallstones. No convincing pericholecystic edema/inflammation. No biliary ductal dilation. Pancreas: Normal in appearance without evidence of mass, ductal dilation, or inflammation. Spleen: Normal in size and appearance. Adrenals/Urinary Tract: Normal appearing adrenal glands. Subcentimeter benign cortical cyst involving the LOWER pole the RIGHT kidney. No significant parenchymal abnormalities involving either kidney. No hydronephrosis. No urinary tract calculi. Normal appearing urinary bladder. Stomach/Bowel: Gas-filled diverticulum arising from the distal esophagus near the EG junction. Stomach normal in appearance for the degree of distention. Normal-appearing small bowel. Extensive diffuse colonic diverticulosis without evidence of acute diverticulitis. Normal appendix in the RIGHT UPPER pelvis. Vascular/Lymphatic: Mild aortoiliac atherosclerosis without evidence of aneurysm. Normal-appearing portal venous and systemic venous systems. No pathologic lymphadenopathy. Numerous normal sized lymph nodes throughout the mesentery and retroperitoneum. Reproductive: Normal-appearing uterus and ovaries without evidence of adnexal mass. Other: None. Musculoskeletal: Broad-based central disc protrusion at L5-S1 and facet degenerative changes at this level. No acute findings. IMPRESSION: 1. Mild gallbladder wall thickening without evidence of calcified gallstones or pericholecystic edema/inflammation. Acalculous cholecystitis or chronic cholecystitis might have this appearance. RIGHT UPPER QUADRANT abdominal ultrasound may be helpful to confirm or deny this finding. 2. No acute abnormalities otherwise involving the abdomen or pelvis. 3. Diffuse colonic diverticulosis without evidence of acute diverticulitis. 4. Gas-filled diverticulum arising from  the distal esophagus near the EG junction. Aortic Atherosclerosis (ICD10-170.0) Electronically Signed   By: TEvangeline DakinM.D.   On: 02/28/2019 13:38   UKoreaAbdomen Limited Ruq  Result Date: 02/28/2019 CLINICAL DATA:  RIGHT upper quadrant pain. EXAM: ULTRASOUND ABDOMEN LIMITED RIGHT UPPER QUADRANT COMPARISON:  None. FINDINGS: Gallbladder: Multiple shadowing gallstones and sludge. Difficult to measure a discrete calculus single dimension. Wall thickness 2.0 mm. Negative sonographic Murphy's sign. Common bile duct: Diameter: Normal, 5 mm. Liver: No focal lesion identified. Within normal limits in parenchymal echogenicity. Portal vein is patent on color Doppler imaging with normal direction of blood flow towards the liver. IMPRESSION: Cholelithiasis without sonographic features of acute cholecystitis. Electronically Signed   By: JStaci RighterM.D.   On: 02/28/2019 10:50   Scheduled Meds:  acetaminophen       bupivacaine liposome  20 mL Infiltration On Call to OR   celecoxib       gabapentin       gabapentin  300 mg Oral BID   senna  1 tablet Oral BID   sodium chloride flush  3 mL Intravenous Q12H   Continuous Infusions:  sodium chloride 100 mL/hr at 03/01/19 1303   cefTRIAXone (ROCEPHIN)  IV     methocarbamol (ROBAXIN) IV      LOS: 0 days   OKerney Elbe DO [facility] PAGER is on [place] If 7PM-7AM, please contact night-coverage [email or web address] Password  [email or web address] 03/01/2019, 4:06 PM

## 2019-03-01 NOTE — Progress Notes (Signed)
CC: Epigastric/right upper quadrant pain with nausea  Subjective: Patient is not having any pain this morning.  LFTs are up slightly but she is not symptomatic.    Objective: Vital signs in last 24 hours: Temp:  [96 F (35.6 C)-98.9 F (37.2 C)] 96 F (35.6 C) (04/27 0450) Pulse Rate:  [61-74] 61 (04/27 0450) Resp:  [16-18] 16 (04/27 0450) BP: (147-180)/(82-101) 156/84 (04/27 0450) SpO2:  [92 %-99 %] 94 % (04/27 0450) Weight:  [83.9 kg] 83.9 kg (04/26 1807) Last BM Date: 02/26/19 N.p.o. 1226 IV Voided this a.m. x1 Emesis x1 Afebrile blood pressures elevated 180/88 -156/84 last 2 blood pressures. Labs show WBC is normal at 4.0.  Total bilirubin is 1.6 ALT is 530 AST 643 alk phos 224. CMP Latest Ref Rng & Units 03/01/2019 02/28/2019 01/07/2019  Glucose 70 - 99 mg/dL 90 123(H) 95  BUN 6 - 20 mg/dL 9 12 16   Creatinine 0.44 - 1.00 mg/dL 0.98 0.97 1.11(H)  Sodium 135 - 145 mmol/L 140 134(L) 140  Potassium 3.5 - 5.1 mmol/L 3.7 3.9 4.0  Chloride 98 - 111 mmol/L 111 105 106  CO2 22 - 32 mmol/L 22 21(L) 29  Calcium 8.9 - 10.3 mg/dL 8.3(L) 8.6(L) 9.5  Total Protein 6.5 - 8.1 g/dL 7.0 8.0 7.3  Total Bilirubin 0.3 - 1.2 mg/dL 1.6(H) 1.3(H) 0.8  Alkaline Phos 38 - 126 U/L 224(H) 186(H) -  AST 15 - 41 U/L 643(H) 709(H) 18  ALT 0 - 44 U/L 530(H) 313(H) 13    Intake/Output from previous day: 04/26 0701 - 04/27 0700 In: 1262.6 [I.V.:1262.6] Out: -  Intake/Output this shift: No intake/output data recorded.  General appearance: alert, cooperative and no distress Resp: clear to auscultation bilaterally GI: soft, non-tender; bowel sounds normal; no masses,  no organomegaly  Lab Results:  Recent Labs    02/28/19 0951 03/01/19 0344  WBC 6.6 4.0  HGB 11.7* 10.5*  HCT 37.8 34.8*  PLT 201 189    BMET Recent Labs    02/28/19 0951 03/01/19 0344  NA 134* 140  K 3.9 3.7  CL 105 111  CO2 21* 22  GLUCOSE 123* 90  BUN 12 9  CREATININE 0.97 0.98  CALCIUM 8.6* 8.3*    PT/INR No results for input(s): LABPROT, INR in the last 72 hours.  Recent Labs  Lab 02/28/19 0951 03/01/19 0344  AST 709* 643*  ALT 313* 530*  ALKPHOS 186* 224*  BILITOT 1.3* 1.6*  PROT 8.0 7.0  ALBUMIN 3.5 3.0*     Lipase     Component Value Date/Time   LIPASE 45 02/28/2019 0951     Medications: . senna  1 tablet Oral BID  . sodium chloride flush  3 mL Intravenous Q12H   . sodium chloride 100 mL/hr at 03/01/19 0600   Anti-infectives (From admission, onward)   None     Prior to Admission medications   Medication Sig Start Date End Date Taking? Authorizing Provider  albuterol (PROVENTIL HFA;VENTOLIN HFA) 108 (90 Base) MCG/ACT inhaler Inhale 2 puffs into the lungs every 6 (six) hours as needed for wheezing or shortness of breath. 05/27/16  Yes Hoyt Koch, MD  valACYclovir (VALTREX) 500 MG tablet 1 po q day and increase to bid x 3 days with symptoms. 11/06/18  Yes Megan Salon, MD     Assessment/Plan HIV - not currently on RX Hypertension  Symptomatic cholelithiasis/possible choledocholithiasis. Elevated LFT's/normal WBC  FEN: IV fluids/NPO ID: none:  Rocephin pre op  DVT:  SCD Follow up:  [facility] Contact: Son Stephanie Espinoza [phone]  Plan:  We discussed going forward with laparoscopic cholecystectomy with IOC.  I discussed the risk and benefits with her, I also talked to her son on the phone.  She is ready to move forward with surgery today. She is on no home medicines, Valacyclovir is as needed.      LOS: 0 days    Stephanie Espinoza 03/01/2019 [phone]

## 2019-03-01 NOTE — Transfer of Care (Signed)
Immediate Anesthesia Transfer of Care Note  Patient: Stephanie Espinoza  Procedure(s) Performed: LAPAROSCOPIC CHOLECYSTECTOMY SINGLE SITE/ INTRAOPERATIVE CHOLANGIOGRAM (N/A )  Patient Location: PACU  Anesthesia Type:General  Level of Consciousness: awake, alert  and oriented  Airway & Oxygen Therapy: Patient Spontanous Breathing and Patient connected to face mask oxygen  Post-op Assessment: Report given to RN and Post -op Vital signs reviewed and stable  Post vital signs: Reviewed and stable  Last Vitals:  Vitals Value Taken Time  BP 133/89 03/01/2019 11:51 AM  Temp    Pulse 69 03/01/2019 11:54 AM  Resp 17 03/01/2019 11:54 AM  SpO2 100 % 03/01/2019 11:54 AM  Vitals shown include unvalidated device data.  Last Pain:  Vitals:   03/01/19 0940  TempSrc: Oral  PainSc:          Complications: No apparent anesthesia complications

## 2019-03-01 NOTE — Anesthesia Preprocedure Evaluation (Addendum)
Anesthesia Evaluation  Patient identified by MRN, date of birth, ID band Patient awake    Reviewed: Allergy & Precautions, NPO status , Patient's Chart, lab work & pertinent test results  History of Anesthesia Complications Negative for: history of anesthetic complications  Airway Mallampati: II  TM Distance: >3 FB Neck ROM: Full    Dental  (+) Upper Dentures, Lower Dentures   Pulmonary former smoker,    breath sounds clear to auscultation       Cardiovascular hypertension,  Rhythm:Regular     Neuro/Psych  Headaches, PSYCHIATRIC DISORDERS Anxiety Depression  Neuromuscular disease    GI/Hepatic Elevated LFTs cholecystitis   Endo/Other  Morbid obesity  Renal/GU negative Renal ROS     Musculoskeletal negative musculoskeletal ROS (+)   Abdominal   Peds  Hematology  (+) HIV,   Anesthesia Other Findings   Reproductive/Obstetrics                            Anesthesia Physical Anesthesia Plan  ASA: II  Anesthesia Plan: General   Post-op Pain Management:    Induction: Intravenous, Rapid sequence and Cricoid pressure planned  PONV Risk Score and Plan: 3 and Ondansetron and Dexamethasone  Airway Management Planned: Oral ETT  Additional Equipment: None  Intra-op Plan:   Post-operative Plan: Extubation in OR  Informed Consent: I have reviewed the patients History and Physical, chart, labs and discussed the procedure including the risks, benefits and alternatives for the proposed anesthesia with the patient or authorized representative who has indicated his/her understanding and acceptance.     Dental advisory given  Plan Discussed with: CRNA and Surgeon  Anesthesia Plan Comments:        Anesthesia Quick Evaluation

## 2019-03-01 NOTE — Progress Notes (Signed)
On call MD notified of elevated BP of 178/91 (patient is asymptomatic), new orders obtained, will recheck BP.  Norlene Duel RN, BSN

## 2019-03-02 ENCOUNTER — Encounter (HOSPITAL_COMMUNITY): Payer: Self-pay | Admitting: Surgery

## 2019-03-02 ENCOUNTER — Encounter (HOSPITAL_COMMUNITY)
Admission: EM | Disposition: A | Discharge status: Home / Self Care | Payer: Self-pay | Source: Home / Self Care | Attending: Internal Medicine

## 2019-03-02 ENCOUNTER — Observation Stay (HOSPITAL_COMMUNITY): Payer: BC Managed Care – PPO | Admitting: Anesthesiology

## 2019-03-02 ENCOUNTER — Observation Stay (HOSPITAL_COMMUNITY): Payer: BC Managed Care – PPO

## 2019-03-02 DIAGNOSIS — Z8619 Personal history of other infectious and parasitic diseases: Secondary | ICD-10-CM | POA: Diagnosis not present

## 2019-03-02 DIAGNOSIS — R7989 Other specified abnormal findings of blood chemistry: Secondary | ICD-10-CM | POA: Diagnosis present

## 2019-03-02 DIAGNOSIS — Z79899 Other long term (current) drug therapy: Secondary | ICD-10-CM | POA: Diagnosis not present

## 2019-03-02 DIAGNOSIS — K8067 Calculus of gallbladder and bile duct with acute and chronic cholecystitis with obstruction: Secondary | ICD-10-CM | POA: Diagnosis present

## 2019-03-02 DIAGNOSIS — R1011 Right upper quadrant pain: Secondary | ICD-10-CM | POA: Diagnosis present

## 2019-03-02 DIAGNOSIS — Z6833 Body mass index (BMI) 33.0-33.9, adult: Secondary | ICD-10-CM | POA: Diagnosis not present

## 2019-03-02 DIAGNOSIS — Z66 Do not resuscitate: Secondary | ICD-10-CM | POA: Diagnosis present

## 2019-03-02 DIAGNOSIS — J309 Allergic rhinitis, unspecified: Secondary | ICD-10-CM | POA: Diagnosis present

## 2019-03-02 DIAGNOSIS — D649 Anemia, unspecified: Secondary | ICD-10-CM | POA: Diagnosis present

## 2019-03-02 DIAGNOSIS — B20 Human immunodeficiency virus [HIV] disease: Secondary | ICD-10-CM | POA: Diagnosis present

## 2019-03-02 DIAGNOSIS — Z87891 Personal history of nicotine dependence: Secondary | ICD-10-CM | POA: Diagnosis not present

## 2019-03-02 DIAGNOSIS — Z888 Allergy status to other drugs, medicaments and biological substances status: Secondary | ICD-10-CM | POA: Diagnosis not present

## 2019-03-02 DIAGNOSIS — I10 Essential (primary) hypertension: Secondary | ICD-10-CM | POA: Diagnosis present

## 2019-03-02 DIAGNOSIS — K8050 Calculus of bile duct without cholangitis or cholecystitis without obstruction: Secondary | ICD-10-CM | POA: Diagnosis not present

## 2019-03-02 DIAGNOSIS — E669 Obesity, unspecified: Secondary | ICD-10-CM | POA: Diagnosis present

## 2019-03-02 DIAGNOSIS — E876 Hypokalemia: Secondary | ICD-10-CM | POA: Diagnosis present

## 2019-03-02 DIAGNOSIS — R748 Abnormal levels of other serum enzymes: Secondary | ICD-10-CM | POA: Diagnosis present

## 2019-03-02 DIAGNOSIS — R739 Hyperglycemia, unspecified: Secondary | ICD-10-CM | POA: Diagnosis present

## 2019-03-02 DIAGNOSIS — K812 Acute cholecystitis with chronic cholecystitis: Secondary | ICD-10-CM | POA: Diagnosis not present

## 2019-03-02 HISTORY — PX: ESOPHAGOGASTRODUODENOSCOPY (EGD) WITH PROPOFOL: SHX5813

## 2019-03-02 HISTORY — PX: EUS: SHX5427

## 2019-03-02 LAB — FOLATE: Folate: 11.8 ng/mL (ref >5.9)

## 2019-03-02 LAB — CBC WITH DIFFERENTIAL/PLATELET
Abs Immature Granulocytes: 0.03 10*3/uL (ref 0.00–0.07)
Basophils Absolute: 0 10*3/uL (ref 0.0–0.1)
Basophils Relative: 0 %
Eosinophils Absolute: 0 10*3/uL (ref 0.0–0.5)
Eosinophils Relative: 0 %
HCT: 36.5 % (ref 36.0–46.0)
Hemoglobin: 11.1 g/dL — ABNORMAL LOW (ref 12.0–15.0)
Immature Granulocytes: 1 %
Lymphocytes Relative: 30 %
Lymphs Abs: 1.8 10*3/uL (ref 0.7–4.0)
MCH: 27.6 pg (ref 26.0–34.0)
MCHC: 30.4 g/dL (ref 30.0–36.0)
MCV: 90.8 fL (ref 80.0–100.0)
Monocytes Absolute: 0.5 10*3/uL (ref 0.1–1.0)
Monocytes Relative: 9 %
Neutro Abs: 3.6 10*3/uL (ref 1.7–7.7)
Neutrophils Relative %: 60 %
Platelets: 207 10*3/uL (ref 150–400)
RBC: 4.02 MIL/uL (ref 3.87–5.11)
RDW: 13.1 % (ref 11.5–15.5)
WBC: 6 10*3/uL (ref 4.0–10.5)
nRBC: 0 % (ref 0.0–0.2)

## 2019-03-02 LAB — COMPREHENSIVE METABOLIC PANEL
ALT: 463 U/L — ABNORMAL HIGH (ref 0–44)
AST: 386 U/L — ABNORMAL HIGH (ref 15–41)
Albumin: 3.1 g/dL — ABNORMAL LOW (ref 3.5–5.0)
Alkaline Phosphatase: 245 U/L — ABNORMAL HIGH (ref 38–126)
Anion gap: 7 (ref 5–15)
BUN: 12 mg/dL (ref 6–20)
CO2: 22 mmol/L (ref 22–32)
Calcium: 8.6 mg/dL — ABNORMAL LOW (ref 8.9–10.3)
Chloride: 112 mmol/L — ABNORMAL HIGH (ref 98–111)
Creatinine, Ser: 1.01 mg/dL — ABNORMAL HIGH (ref 0.44–1.00)
GFR calc Af Amer: >60 mL/min (ref >60)
GFR calc non Af Amer: >60 mL/min (ref >60)
Glucose, Bld: 95 mg/dL (ref 70–99)
Potassium: 3.8 mmol/L (ref 3.5–5.1)
Sodium: 141 mmol/L (ref 135–145)
Total Bilirubin: 1.5 mg/dL — ABNORMAL HIGH (ref 0.3–1.2)
Total Protein: 7.1 g/dL (ref 6.5–8.1)

## 2019-03-02 LAB — IRON AND TIBC
Iron: 99 ug/dL (ref 28–170)
Saturation Ratios: 32 % — ABNORMAL HIGH (ref 10.4–31.8)
TIBC: 307 ug/dL (ref 250–450)
UIBC: 208 ug/dL

## 2019-03-02 LAB — RETICULOCYTES
Immature Retic Fract: 12.8 % (ref 2.3–15.9)
RBC.: 4.02 MIL/uL (ref 3.87–5.11)
Retic Count, Absolute: 47.8 10*3/uL (ref 19.0–186.0)
Retic Ct Pct: 1.2 % (ref 0.4–3.1)

## 2019-03-02 LAB — MAGNESIUM: Magnesium: 2 mg/dL (ref 1.7–2.4)

## 2019-03-02 LAB — HEMOGLOBIN A1C
Hgb A1c MFr Bld: 5.5 % (ref 4.8–5.6)
Mean Plasma Glucose: 111.15 mg/dL

## 2019-03-02 LAB — HEPATITIS PANEL, ACUTE
HCV Ab: 0.2 s/co ratio (ref 0.0–0.9)
Hep A IgM: NEGATIVE
Hep B C IgM: NEGATIVE
Hepatitis B Surface Ag: NEGATIVE

## 2019-03-02 LAB — VITAMIN B12: Vitamin B-12: 598 pg/mL (ref 180–914)

## 2019-03-02 LAB — FERRITIN: Ferritin: 83 ng/mL (ref 11–307)

## 2019-03-02 LAB — PHOSPHORUS: Phosphorus: 3.8 mg/dL (ref 2.5–4.6)

## 2019-03-02 SURGERY — ULTRASOUND, UPPER GI TRACT, ENDOSCOPIC
Anesthesia: General

## 2019-03-02 MED ORDER — FENTANYL CITRATE (PF) 100 MCG/2ML IJ SOLN
INTRAMUSCULAR | Status: AC
  Filled 2019-03-02: qty 2

## 2019-03-02 MED ORDER — EPHEDRINE SULFATE 50 MG/ML IJ SOLN
INTRAMUSCULAR | Status: DC | PRN
  Administered 2019-03-02 (×2): 10 mg via INTRAVENOUS

## 2019-03-02 MED ORDER — LIDOCAINE HCL (CARDIAC) PF 100 MG/5ML IV SOSY
PREFILLED_SYRINGE | INTRAVENOUS | Status: DC | PRN
  Administered 2019-03-02: 60 mg via INTRAVENOUS

## 2019-03-02 MED ORDER — HYDRALAZINE HCL 20 MG/ML IJ SOLN: 10.0000 mg | Freq: Four times a day (QID) | INTRAMUSCULAR | Status: DC | PRN

## 2019-03-02 MED ORDER — INDOMETHACIN 50 MG RE SUPP: 100.0000 mg | Freq: Once | RECTAL | Status: DC

## 2019-03-02 MED ORDER — LACTATED RINGERS IV SOLN
INTRAVENOUS | Status: DC
  Administered 2019-03-02: 1000 mL via INTRAVENOUS

## 2019-03-02 MED ORDER — HYDRALAZINE HCL 20 MG/ML IJ SOLN: 10.0000 mg | Freq: Once | INTRAMUSCULAR | Status: DC

## 2019-03-02 MED ORDER — ONDANSETRON HCL 4 MG/2ML IJ SOLN
INTRAMUSCULAR | Status: DC | PRN
  Administered 2019-03-02: 4 mg via INTRAVENOUS

## 2019-03-02 MED ORDER — CIPROFLOXACIN IN D5W 400 MG/200ML IV SOLN: 400.0000 mg | Freq: Once | INTRAVENOUS | Status: DC

## 2019-03-02 MED ORDER — CLONIDINE HCL 0.2 MG PO TABS
0.2000 mg | ORAL_TABLET | Freq: Once | ORAL | Status: AC
  Administered 2019-03-02: 0.2 mg via ORAL
  Filled 2019-03-02: qty 1

## 2019-03-02 MED ORDER — HYDRALAZINE HCL 20 MG/ML IJ SOLN
5.0000 mg | Freq: Once | INTRAMUSCULAR | Status: DC
  Filled 2019-03-02: qty 1

## 2019-03-02 MED ORDER — PROPOFOL 10 MG/ML IV BOLUS
INTRAVENOUS | Status: DC | PRN
  Administered 2019-03-02: 150 mg via INTRAVENOUS

## 2019-03-02 MED ORDER — SUCCINYLCHOLINE CHLORIDE 20 MG/ML IJ SOLN
INTRAMUSCULAR | Status: DC | PRN
  Administered 2019-03-02: 120 mg via INTRAVENOUS

## 2019-03-02 MED ORDER — FENTANYL CITRATE (PF) 100 MCG/2ML IJ SOLN
INTRAMUSCULAR | Status: DC | PRN
  Administered 2019-03-02 (×2): 50 ug via INTRAVENOUS

## 2019-03-02 NOTE — Anesthesia Preprocedure Evaluation (Addendum)
Anesthesia Evaluation  Patient identified by MRN, date of birth, ID band Patient awake    Reviewed: Allergy & Precautions, NPO status , Patient's Chart, lab work & pertinent test results  Airway Mallampati: I  TM Distance: >3 FB Neck ROM: Full    Dental  (+) Edentulous Upper, Edentulous Lower   Pulmonary former smoker,    breath sounds clear to auscultation       Cardiovascular hypertension,  Rhythm:Regular Rate:Normal     Neuro/Psych  Headaches, Anxiety Depression    GI/Hepatic negative GI ROS, Neg liver ROS,   Endo/Other  negative endocrine ROS  Renal/GU negative Renal ROS     Musculoskeletal negative musculoskeletal ROS (+)   Abdominal Normal abdominal exam  (+)   Peds  Hematology   Anesthesia Other Findings   Reproductive/Obstetrics                            Anesthesia Physical Anesthesia Plan  ASA: III  Anesthesia Plan: General   Post-op Pain Management:    Induction: Intravenous  PONV Risk Score and Plan: Ondansetron, Propofol infusion and Treatment may vary due to age or medical condition  Airway Management Planned: Oral ETT  Additional Equipment: None  Intra-op Plan:   Post-operative Plan: Extubation in OR  Informed Consent: I have reviewed the patients History and Physical, chart, labs and discussed the procedure including the risks, benefits and alternatives for the proposed anesthesia with the patient or authorized representative who has indicated his/her understanding and acceptance.     Dental advisory given  Plan Discussed with: CRNA  Anesthesia Plan Comments:        Anesthesia Quick Evaluation

## 2019-03-02 NOTE — Progress Notes (Signed)
Subjective: Abdominal pain has resolved. Tolerating clear liquid diet.  Objective: Vital signs in last 24 hours: Temp:  [97.3 F (36.3 C)-98.5 F (36.9 C)] 98.5 F (36.9 C) (04/28 0925) Pulse Rate:  [55-77] 55 (04/28 0925) Resp:  [10-20] 16 (04/28 0925) BP: (124-187)/(80-98) 187/90 (04/28 0925) SpO2:  [92 %-100 %] 97 % (04/28 0925) Weight change:  Last BM Date: 02/27/19  PE: GEN:  NAD ABD:  Soft, non-tender  Lab Results: CBC    Component Value Date/Time   WBC 6.0 03/02/2019 0439   RBC 4.02 03/02/2019 0439   RBC 4.02 03/02/2019 0439   HGB 11.1 (L) 03/02/2019 0439   HGB 11.8 06/30/2018 0939   HGB 12.3 04/21/2009 1316   HCT 36.5 03/02/2019 0439   HCT 36.7 06/30/2018 0939   HCT 38.1 04/21/2009 1316   PLT 207 03/02/2019 0439   PLT 238 06/30/2018 0939   MCV 90.8 03/02/2019 0439   MCV 87 06/30/2018 0939   MCV 84 04/21/2009 1316   MCH 27.6 03/02/2019 0439   MCHC 30.4 03/02/2019 0439   RDW 13.1 03/02/2019 0439   RDW 14.0 06/30/2018 0939   RDW 11.7 04/21/2009 1316   LYMPHSABS 1.8 03/02/2019 0439   LYMPHSABS 1.9 06/30/2018 0939   LYMPHSABS 2.9 04/21/2009 1316   MONOABS 0.5 03/02/2019 0439   EOSABS 0.0 03/02/2019 0439   EOSABS 0.1 06/30/2018 0939   EOSABS 0.1 04/21/2009 1316   BASOSABS 0.0 03/02/2019 0439   BASOSABS 0.0 06/30/2018 0939   BASOSABS 0.0 04/21/2009 1316   CMP     Component Value Date/Time   NA 141 03/02/2019 0439   K 3.8 03/02/2019 0439   CL 112 (H) 03/02/2019 0439   CO2 22 03/02/2019 0439   GLUCOSE 95 03/02/2019 0439   BUN 12 03/02/2019 0439   CREATININE 1.01 (H) 03/02/2019 0439   CREATININE 1.11 (H) 01/07/2019 0837   CALCIUM 8.6 (L) 03/02/2019 0439   PROT 7.1 03/02/2019 0439   ALBUMIN 3.1 (L) 03/02/2019 0439   AST 386 (H) 03/02/2019 0439   ALT 463 (H) 03/02/2019 0439   ALKPHOS 245 (H) 03/02/2019 0439   BILITOT 1.5 (H) 03/02/2019 0439   GFRNONAA >60 03/02/2019 0439   GFRAA >60 03/02/2019 0439   MRCP:  CBD 5 mm; no bile duct stone  seen.  Assessment:  1.  Elevated liver enzymes, downtrending. 2.  Cholecystitis POD #1 cholecystectomy. 3.  Abnormal intraoperative cholangiogram (no emptying of contrast into duodenum).  Plan:  1.  Discussed options (watchful waiting; ERCP; EUS with ERCP only if EUS shows CBD stones).   2.  In light of relative pros/cons and risks/benefits of all these approaches, we have elected to proceed with EUS with ERCP (under same sedation) only if choledocholithiasis seen on EUS. 3.  Case discussed with patient and her son, and they are in agreement with this approach. 4.  Risks (bleeding, infection, bowel perforation that could require surgery, sedation-related changes in cardiopulmonary systems), benefits (identification and possible treatment of source of symptoms, exclusion of certain causes of symptoms), and alternatives (watchful waiting, radiographic imaging studies, empiric medical treatment) of upper endoscopy with ultrasound (EUS) were explained to patient/family in detail and patient wishes to proceed. 5.  Risks (up to and including bleeding, infection, perforation, pancreatitis that can be complicated by infected necrosis and death), benefits (removal of stones, alleviating blockage, decreasing risk of cholangitis or choledocholithiasis-related pancreatitis), and alternatives (watchful waiting, percutaneous transhepatic cholangiography) of ERCP were explained to patient/family in detail and patient elects to proceed.  6.  [facility] will follow.   Landry Dyke 03/02/2019, 10:59 AM   Cell [phone] If no answer or after 5 PM call [phone]

## 2019-03-02 NOTE — Op Note (Signed)
[facility] Patient Name: Stephanie Espinoza Procedure Date: 03/02/2019 MRN: [id number] Attending MD: Arta Silence , MD Date of Birth: Jun 30, 1956 CSN: [id number] Age: 63 Admit Type: Inpatient Procedure:                Upper EUS Indications:              Elevated liver enzymes, abnormal cholangiogram (no                            emptying into duodenum) Providers:                Arta Silence, MD, Cleda Daub, RN, Glori Bickers, RN, Marguerita Merles, Technician, Elspeth Cho Tech., Technician, Cherylynn Ridges,                            Technician, Rosario Adie, CRNA Referring MD:             Dr. Clyda Greener ([facility]) Medicines:                General Anesthesia Complications:            No immediate complications. Estimated Blood Loss:     Estimated blood loss: none. Procedure:                Pre-Anesthesia Assessment:                           - Prior to the procedure, a History and Physical                            was performed, and patient medications and                            allergies were reviewed. The patient's tolerance of                            previous anesthesia was also reviewed. The risks                            and benefits of the procedure and the sedation                            options and risks were discussed with the patient.                            All questions were answered, and informed consent                            was obtained. Prior Anticoagulants: The patient has                            taken no  previous anticoagulant or antiplatelet                            agents. ASA Grade Assessment: II - A patient with                            mild systemic disease. After reviewing the risks                            and benefits, the patient was deemed in                            satisfactory condition to undergo the procedure.                           After obtaining  informed consent, the endoscope was                            passed under direct vision. Throughout the                            procedure, the patient's blood pressure, pulse, and                            oxygen saturations were monitored continuously. The                            [id number] ([id number]) Olympus Radial EUS was                            introduced through the mouth, and advanced to the                            second part of duodenum. The upper EUS was                            accomplished without difficulty. The patient                            tolerated the procedure well. Scope In: Scope Out: Findings:      ENDOSONOGRAPHIC FINDING: :      There was no sign of significant endosonographic abnormality in the left       lobe of the liver.      The peripancreatic region and porta hepatis region nodes were       endosonographically normal. No pathologic lymphadenopathy was identified.      There was no sign of significant endosonographic abnormality in the       common bile duct. The maximum diameter of the duct was 4 mm. No bile       duct stone seen.      There was no sign of significant endosonographic abnormality in the       pancreatic head, genu of the pancreas and uncinate process of the       pancreas. No masses.      There was no  sign of significant endosonographic abnormality in the       ampulla. No mass. No impacted ampullary stone noted. Endoscopically,       bile was seen flowing through the ampulla into the duodenum. Impression:               - There was no evidence of significant pathology in                            the left lobe of the liver.                           - Normal lymph nodes were visualized.                           - There was no sign of significant pathology in the                            common bile duct.                           - There was no sign of significant pathology in the                             pancreatic head, genu of the pancreas and uncinate                            process of the pancreas.                           - There was no sign of significant pathology in the                            ampulla.                           - Suspect patient had transient ampullary spasm.                            Bile duct stone unlikely; no visualized bile duct                            stones seen on MRCP or today's EUS. Moderate Sedation:      None Recommendation:           - Return patient to hospital ward for ongoing care.                           - Clear liquid diet today.                           - Given patient's resolution of pain, downtrending                            LFTs, non-dilated bile duct, bile flow through  ampulla into duodenum witnessed on today's exam, as                            well as negative MRCP and EUS for stones, suspect                            ampullary spasm as source of IOC findings, for                            which we would typically not do ERCP unless patient                            later develops abdominal pain or imaging/lab                            findings consistent with biliary obstruction.                           - [facility] will follow. Could likely discharge home                            later today or tomorrow from GI perspective with                            repeat LFTs and outpatient GI follow-up (likely                            televisit) in a couple weeks. Procedure Code(s):        --- Professional ---                           [phone], Esophagogastroduodenoscopy, flexible,                            transoral; with endoscopic ultrasound examination,                            including the esophagus, stomach, and either the                            duodenum or a surgically altered stomach where the                            jejunum is examined distal to the anastomosis Diagnosis  Code(s):        --- Professional ---                           R74.8, Abnormal levels of other serum enzymes CPT copyright 2019 [employer]. All rights reserved. The codes documented in this report are preliminary and upon coder review may  be revised to meet current compliance requirements. Arta Silence, MD 03/02/2019 1:26:13 PM This report has been signed electronically. Number of Addenda: 0

## 2019-03-02 NOTE — Anesthesia Postprocedure Evaluation (Signed)
Anesthesia Post Note  Patient: Stephanie Espinoza  Procedure(s) Performed: FULL UPPER ENDOSCOPIC ULTRASOUND (EUS) RADIAL (N/A )     Patient location during evaluation: PACU Anesthesia Type: General Level of consciousness: awake and alert Pain management: pain level controlled Vital Signs Assessment: post-procedure vital signs reviewed and stable Respiratory status: spontaneous breathing, nonlabored ventilation, respiratory function stable and patient connected to nasal cannula oxygen Cardiovascular status: blood pressure returned to baseline and stable Postop Assessment: no apparent nausea or vomiting Anesthetic complications: no    Last Vitals:  Vitals:   03/02/19 1350 03/02/19 1411  BP: (!) 170/94 (!) 144/82  Pulse: 72 61  Resp: 17   Temp:  36.9 C  SpO2: 93% 96%    Last Pain:  Vitals:   03/02/19 1411  TempSrc: Oral  PainSc:                  Effie Berkshire

## 2019-03-02 NOTE — Progress Notes (Signed)
Patient refused BP medication (one time order: Hydralazine 5mg ), pt educated on medication earlier in shift. Patient asymptomatic and has no complaints of pain.   Norlene Duel RN, BSN

## 2019-03-02 NOTE — Progress Notes (Signed)
1 Day Post-Op    CC:  Epigastric/right upper quadrant pain with nausea  Subjective: Looks good taking nothing for pain.  She has issues with pain medicines.  Took some Tylenol for a headache.  Objective: Vital signs in last 24 hours: Temp:  [97.3 F (36.3 C)-98.9 F (37.2 C)] 97.8 F (36.6 C) (04/28 0519) Pulse Rate:  [57-77] 57 (04/28 0519) Resp:  [10-20] 14 (04/28 0519) BP: (124-188)/(80-98) 169/89 (04/28 0519) SpO2:  [92 %-100 %] 96 % (04/28 0519) Last BM Date: 02/26/19 360 PO NPO for possible ERCP 2150 urine No BM Afebrile, VSS WBC 6.0 LFT's still up but trending down CMP Latest Ref Rng & Units 03/02/2019 03/01/2019 02/28/2019  Total Protein 6.5 - 8.1 g/dL 7.1 7.0 8.0  Total Bilirubin 0.3 - 1.2 mg/dL 1.5(H) 1.6(H) 1.3(H)  Alkaline Phos 38 - 126 U/L 245(H) 224(H) 186(H)  AST 15 - 41 U/L 386(H) 643(H) 709(H)  ALT 0 - 44 U/L 463(H) 530(H) 313(H)  IOC 4/27:No passage of contrast into the duodenum suggesting obstructing distal common duct stone. MRI 4/27:  Hepatobiliary: No hepatic masses identified. Expected postop changes from recent cholecystectomy. No abnormal fluid collections in gallbladder fossa. No evidence of biliary ductal dilatation with common bile duct measuring 5 mm. No evidence of choledocholithiasis or biliary stricture. Intake/Output from previous day: 04/27 0701 - 04/28 0700 In: 3195 [P.O.:360; I.V.:2735; IV Piggyback:100] Out: 2170 [Urine:2150; Blood:20] Intake/Output this shift: Total I/O In: 0  Out: 600 [Urine:600]  General appearance: alert, cooperative and no distress Resp: clear to auscultation bilaterally GI: soft, sore, site is dry.  NPO awaiting endoscopy procedure  Lab Results:  Recent Labs    03/01/19 0344 03/02/19 0439  WBC 4.0 6.0  HGB 10.5* 11.1*  HCT 34.8* 36.5  PLT 189 207    BMET Recent Labs    03/01/19 0344 03/02/19 0439  NA 140 141  K 3.7 3.8  CL 111 112*  CO2 22 22  GLUCOSE 90 95  BUN 9 12  CREATININE 0.98  1.01*  CALCIUM 8.3* 8.6*   PT/INR No results for input(s): LABPROT, INR in the last 72 hours.  Recent Labs  Lab 02/28/19 0951 03/01/19 0344 03/02/19 0439  AST 709* 643* 386*  ALT 313* 530* 463*  ALKPHOS 186* 224* 245*  BILITOT 1.3* 1.6* 1.5*  PROT 8.0 7.0 7.1  ALBUMIN 3.5 3.0* 3.1*     Lipase     Component Value Date/Time   LIPASE 45 02/28/2019 0951     Medications: . gabapentin  300 mg Oral BID  . hydrALAZINE  5 mg Intravenous Once  . senna  1 tablet Oral BID  . sodium chloride flush  3 mL Intravenous Q12H   . sodium chloride 100 mL/hr at 03/01/19 1303  . methocarbamol (ROBAXIN) IV     Anti-infectives (From admission, onward)   Start     Dose/Rate Route Frequency Ordered Stop   03/01/19 1045  metroNIDAZOLE (FLAGYL) 5-0.79 MG/ML-% IVPB    Note to Pharmacy:  Randa Evens  : cabinet override      03/01/19 1045 03/01/19 1048   03/01/19 0915  cefTRIAXone (ROCEPHIN) 2 g in sodium chloride 0.9 % 100 mL IVPB     2 g 200 mL/hr over 30 Minutes Intravenous On call to O.R. 03/01/19 0909 03/02/19 0559       Assessment/Plan HIV - not currently on RX Hypertension  Symptomatic cholelithiasis/possible choledocholithiasis. Elevated LFT's/normal WBC Single site laparoscopic cholecystectomy with IOC, 03/01/19, Dr. Michael Boston  FEN:  IV fluids/NPO ID: none:  Rocephin pre op DVT:  SCD Follow up:  [facility] Contact: Son Elicia Lamp [phone]  Plan:  For Endoscopy later today.  Doing well from our standpoint.    When she is ready to go;  follow up for our service is in the AVS.    LOS: 0 days    Eyvonne Burchfield 03/02/2019 [phone]

## 2019-03-02 NOTE — Progress Notes (Signed)
On call made aware of BPs (187/97 and 195/114) and HR of 70, and that patient is refusing hydralazine (education given on medication), patient is asymptomatic and has no complaints of pain at this time. New orders given. Will recheck BP.   Norlene Duel RN, BSN

## 2019-03-02 NOTE — Anesthesia Procedure Notes (Signed)
Procedure Name: Intubation Date/Time: 03/02/2019 12:26 PM Performed by: Glory Buff, CRNA Pre-anesthesia Checklist: Patient identified, Emergency Drugs available, Suction available and Patient being monitored Patient Re-evaluated:Patient Re-evaluated prior to induction Oxygen Delivery Method: Circle system utilized Preoxygenation: Pre-oxygenation with 100% oxygen Induction Type: IV induction Ventilation: Mask ventilation without difficulty Laryngoscope Size: Miller and 3 Grade View: Grade I Tube type: Oral Tube size: 7.0 mm Number of attempts: 1 Airway Equipment and Method: Stylet and Oral airway Placement Confirmation: ETT inserted through vocal cords under direct vision,  positive ETCO2 and breath sounds checked- equal and bilateral Secured at: 21 cm Tube secured with: Tape Dental Injury: Teeth and Oropharynx as per pre-operative assessment

## 2019-03-02 NOTE — Interval H&P Note (Signed)
History and Physical Interval Note:  03/02/2019 11:59 AM  Marquis Lunch  has presented today for surgery, with the diagnosis of abnormal intraoperative cholangiogram, elevated liver enzymes.  The various methods of treatment have been discussed with the patient and family. After consideration of risks, benefits and other options for treatment, the patient has consented to  Procedure(s): FULL UPPER ENDOSCOPIC ULTRASOUND (EUS) RADIAL (N/A) UPPER ENDOSCOPIC ULTRASOUND (EUS) LINEAR (N/A) ENDOSCOPIC RETROGRADE CHOLANGIOPANCREATOGRAPHY (ERCP) (N/A) as a surgical intervention.  The patient's history has been reviewed, patient examined, no change in status, stable for surgery.  I have reviewed the patient's chart and labs.  Questions were answered to the patient's satisfaction.     Mattie Novosel M  Assessment:    1.  Elevated liver enzymes, downtrending. 2.  Abnormal intraoperative cholangiogram, possible bile duct stone.  Plan:  1.  Endoscopic ultrasound, with ERCP to follow only if cbd stone(s) are seen. 2.  Risks (bleeding, infection, bowel perforation that could require surgery, sedation-related changes in cardiopulmonary systems), benefits (identification and possible treatment of source of symptoms, exclusion of certain causes of symptoms), and alternatives (watchful waiting, radiographic imaging studies, empiric medical treatment) of upper endoscopy with ultrasound (EUS) were explained to patient/family in detail and patient wishes to proceed. 3.  Risks (up to and including bleeding, infection, perforation, pancreatitis that can be complicated by infected necrosis and death), benefits (removal of stones, alleviating blockage, decreasing risk of cholangitis or choledocholithiasis-related pancreatitis), and alternatives (watchful waiting, percutaneous transhepatic cholangiography) of ERCP were explained to patient/family in detail and patient elects to proceed.

## 2019-03-02 NOTE — Transfer of Care (Signed)
Immediate Anesthesia Transfer of Care Note  Patient: Stephanie Espinoza  Procedure(s) Performed: FULL UPPER ENDOSCOPIC ULTRASOUND (EUS) RADIAL (N/A )  Patient Location: PACU  Anesthesia Type:General  Level of Consciousness: drowsy, patient cooperative and responds to stimulation  Airway & Oxygen Therapy: Patient Spontanous Breathing and Patient connected to face mask oxygen  Post-op Assessment: Report given to RN and Post -op Vital signs reviewed and stable  Post vital signs: Reviewed and stable  Last Vitals:  Vitals Value Taken Time  BP 177/92 03/02/2019  1:15 PM  Temp    Pulse 78 03/02/2019  1:16 PM  Resp 16 03/02/2019  1:16 PM  SpO2 98 % 03/02/2019  1:16 PM  Vitals shown include unvalidated device data.  Last Pain:  Vitals:   03/02/19 1116  TempSrc: Oral  PainSc: 2          Complications: No apparent anesthesia complications

## 2019-03-02 NOTE — Progress Notes (Signed)
On call MD notified of BP 169/89, and HR 57, patient is asymptomatic and resting with no complaints of pain at this time. New orders obtained.

## 2019-03-02 NOTE — Progress Notes (Signed)
On call made aware of patient refusing IV insertion around 1900.  Norlene Duel RN, BSN

## 2019-03-02 NOTE — Progress Notes (Signed)
PROGRESS NOTE    Stephanie Espinoza  CLE:[id number] DOB: 1956/03/16 DOA: 02/28/2019 PCP: Hoyt Koch, MD  Brief Narrative:  HPI per Dr. Terrilee Croak on 02/28/2019 Stephanie Espinoza is a 63 y.o. female with HIV, hypertension who presented to an urgent care this morning with complaint of abdominal pain and nausea since 2 AM this morning.  Denies vomiting or diarrhea.  No fever.  Pain localized right upper quadrant, no relieving factor.  Worsens with certain movements and palpation.  She was sent to the ED from urgent care. In the ED, patient was afebrile, heart rate in 60s and 70s, breathing comfortably on room air, blood pressure elevated mostly up to 167/95. Labs showed WBC count normal at 6.6, hemoglobin 11.7, platelet 201, sodium level slightly low at 134, potassium 3.9, BUN/creatinine 12/0.97, liver enzymes with alk phos/AST/ALT/total bili respectively elevated to 186/709/313/1.3.  CT abdomen pelvis showed mild gallbladder wall thickening without evidence of calcified gallstones or pericholecystic edema/inflammation suggestive acalculous cholecystitis or chronic cholecystitis. Ultrasound abdomen pelvis showed cholelithiasis without sonographic features of acute cholecystitis.   **Interim History General surgery evaluated and took the patient for surgical intervention this morning and she had a laparoscopic cholecystectomy with intraoperative cholangiogram.  The gallbladder showed early edema with chronic wall changes consistent with acute on chronic cholecystitis with no empyema and then cholangiography was done there is no massive biliary dilatation but there is no contrast going into the duodenum with tiny intraluminal hyperlucency suspicious for questionable distal common bile duct stones causing a distal common bile duct obstruction.  Patient was returned to the medical floor and GI ordering a MRCP and this showed likely postop changes from recent cholecystectomy but no evidence of biliary  ductal dilatation or choledocholithiasis.  There is tiny bilateral pleural effusions and bibasilar atelectasis also noted.  Discussed the case with gastroenterology and patient is to undergo an EUS today and ERCP only if the EUS showed some choledocholithiasis.  Assessment & Plan:   Principal Problem:   Common bile duct stones Active Problems:   Human immunodeficiency virus (HIV) disease ([place])   Essential hypertension   Elevated liver enzymes   Acute on chronic cholecystitis   Normocytic anemia   Hyperglycemia   Obesity (BMI 30.0-34.9)   Choledocholithiasis  Abdominal pain in the setting of Acute on Chronic Cholecystitis s/p Laparoscopic Cholecystectomy POD1 with Abnormal LFTs from suspected Choledocholithiasis and `Cholelithiasis, improving `Abnormal LFTs, improving  `Hyperbilirubinemia with TBili of 1.6 -> 1.5, improving  -Presented  With Right upper quadrant pain associated with nausea and elevated liver enzymes. -Liver enzymes with alk phos/AST/ALT/total bili respectively elevated to 186/709/313/1.3. Now Alk Phos is 245, AST is 386, ALT is 463, and T Bili is 1.5; All down-trending except Alk Phos -CT Abdomen/Pelvis showed "Mild gallbladder wall thickening without evidence of calcified gallstones or pericholecystic edema/inflammation. Acalculous cholecystitis or chronic cholecystitis might have this appearance. No acute abnormalities otherwise involving the abdomen or pelvis. Diffuse colonic diverticulosis without evidence of acute diverticulitis. Gas-filled diverticulum arising from the distal esophagus near the EG junction." -Ultrasound showed cholelithiasis without evidence of acute cholecystitis.  No CBD dilation noted. -General Surgery consulted and as taken for Laparoscopic Surgical Intervention with IOC and is s/p Cholecystectomy -IOC Done showed "No passage of contrast into the duodenum suggesting obstructing distal common duct stone." -GI evaluating and recommending MRCP and  possible ERCP tomorrow -MRCP showed "Showed likely postop changes from recent cholecystectomy but no evidence of biliary ductal dilatation or choledocholithiasis.  There is tiny bilateral  pleural effusions and bibasilar atelectasis also noted." -Discussed Case with Gastroenterology and she is to go for EUS and ERCP with Sphincterotomy only if EUS is positive  -Acute Hepatitis Panel is Negative  -Continue to Monitor and Trend LFT's and Bilirubin -Repeat CMP in AM -Patient received IV Ceftriaxone for perioperative antibiotics -Control with diminishing 9000 mg in the OR as well as acetaminophen 650 p.o./RC every 6 PRN for mild pain, methocarbamol thousand milligrams p.o. every 6 as needed muscle spasms, oxycodone IR 5 mg p.o. every 4 as needed moderate pain -Started bowel regimen with senna 8.6 mg p.o. twice daily and 7 Fleet enema RC PRN for severe constipation as well as sorbitol 70% solution 30 mL's p.o. daily as needed moderate constipation -Continue with supportive care and continue with IV fluid hydration with NS at rate 100 mL per hour as well as antiemetics with Zofran 4 mg p.o/IV every 6 as needed.nausea and vomiting -Diet per GI and and Surgery and she was on CLD and now NPO -Start Pharmocological DVT Prophylaxis when ok with Surgery and GI  HIV -Patient states being HIV positive for last 30 years.   -Not on any antiretroviral treatment.   -Follows up with Infectious Disease on a regular basis -Has a Hx of Abnormal Pap Smear and Hx of Genital Herpes and Syphilis  Hypertension -No other medicines at home. -Blood Pressure was elevated and was 208/96 today -Given Hydralazine 10 mg x1 yesteray but she had refused -Given Hydrlazine this 5 mg this AM as well -Will start Hydralazine 10 mg IV q6hprn for SBP>180 or DBP>100  Normocytic Anemia -Patient's Hb/Hct went from 11.7/37.8 -> 10.5/34.8 -> 11.1/36.5 -Checked Anemia Panel and showed an iron level of 99, U IBC of 208, TIBC 307,  saturation ratio of 32%, ferritin level of 83, folate level of 11.8, and vitamin B12 level of 598 -Likely Dilutional Drop and expect to drop further from Surgical Intervention -Continue to Monitor for S/Sx of Bleeding -Repeat CBC in AM   Hyperglycemia -Patient's Blood Sugar on Admission was 123 and repeat on Glucose on CMP this AM was 90 -CBG was 128 -Checked HbA1c and was 5.5 -Continue to Monitor CBGs carefully and If Blood Sugars remain consistently elevated will need to place on Sensitive Novolog AC  Hx of Bell's Palsy -Has had no Residual Symptoms  Obesity -Estimated body mass index is 33.83 kg/m as calculated from the following:   Height as of this encounter: _0  (1.575 m).   Weight as of this encounter: 83.9 kg., -Weight Loss and Dietary Counseling Given  Elevated Cr -Mild at 1.01 and was 0.97 on admission -Continue to Follow and Monitor Trend   DVT prophylaxis: SCDs as patient went for Surgical Intervention and EUS; Pharmacologic Prophylaxis when procedures are done Code Status: DO NOT RESUSCITATE  Family Communication: No family present at bedside  Disposition Plan: Remain Inpatient for Continued Workup and Treatment   Consultants:   General Surgery  [facility] Gastroenterology    Procedures:  SINGLE SITE Laparoscopic cholecystectomy with intraoperative cholangiogram Done by Dr. Johney Maine on 03/01/2019  MRCP  EUS  Antimicrobials: Anti-infectives (From admission, onward)   Start     Dose/Rate Route Frequency Ordered Stop   03/02/19 1115  ciprofloxacin (CIPRO) IVPB 400 mg     400 mg 200 mL/hr over 60 Minutes Intravenous  Once 03/02/19 1114     03/01/19 1045  metroNIDAZOLE (FLAGYL) 5-0.79 MG/ML-% IVPB    Note to Pharmacy:  Randa Evens  : cabinet override  03/01/19 1045 03/01/19 1048   03/01/19 0915  cefTRIAXone (ROCEPHIN) 2 g in sodium chloride 0.9 % 100 mL IVPB     2 g 200 mL/hr over 30 Minutes Intravenous On call to O.R. 03/01/19 9480 03/02/19 0559       Subjective: Seen and Examined At bedside this AM and states that her abdomen was not as sore and states that is doing okay today.  States she was hungry but was tolerating a clear liquid diet.  No nausea or vomiting.  No other concerns or complaints at this time and is awaiting EUS.  Objective: Vitals:   03/02/19 0116 03/02/19 0519 03/02/19 0925 03/02/19 1116  BP: (!) 159/98 (!) 169/89 (!) 187/90 (!) 208/96  Pulse: 65 (!) 57 (!) 55 63  Resp: _0 Temp: 98 F (36.7 C) 97.8 F (36.6 C) 98.5 F (36.9 C) 98.4 F (36.9 C)  TempSrc: Oral Oral Oral Oral  SpO2: 94% 96% 97% 98%  Weight:    83.9 kg  Height:    _1  (1.575 m)    Intake/Output Summary (Last 24 hours) at 03/02/2019 1244 Last data filed at 03/02/2019 1000 Gross per 24 hour  Intake 2677.98 ml  Output 2750 ml  Net -72.02 ml   Filed Weights   02/28/19 1807 03/02/19 1116  Weight: 83.9 kg 83.9 kg   Examination: Physical Exam:  Constitutional: Well-nourished, well-developed obese female currently no acute distress appears calm and comfortable awaiting EUS Eyes: Lids and conjunctive are normal.  Sclera anicteric ENMT: External ears nose appear normal.  Grossly normal hearing.  Mucous members are moist Neck: Appears supple no JVD Respiratory: Diminished auscultation bilaterally no appreciable wheezing, rales, rhonchi.  Patient not tachypneic or using any accessory muscles to breathe Cardiovascular: Regular rate and rhythm.  No appreciable murmurs, rubs, gallops Abdomen: Soft, nontender to palpate.  Bowel sounds present.  Distended secondary body habitus.  Has a single abdominal incision near the navel which is clean dry and intact GU: Deferred Musculoskeletal: No clubbing or cyanosis.  No joint deformities in upper lower extremities Skin: Skin is warm and dry no appreciable rashes or lesions on limited skin evaluation. Neurologic: Cranial nerves II through XII grossly intact no appreciable focal  deficits. Psychiatric: Normal judgment and insight.  Patient is awake, alert, oriented x3.  Has a pleasant mood and affect  Data Reviewed: I have personally reviewed following labs and imaging studies  CBC: Recent Labs  Lab 02/28/19 0951 03/01/19 0344 03/02/19 0439  WBC 6.6 4.0 6.0  NEUTROABS 4.4  --  3.6  HGB 11.7* 10.5* 11.1*  HCT 37.8 34.8* 36.5  MCV 90.2 91.1 90.8  PLT 201 189 165   Basic Metabolic Panel: Recent Labs  Lab 02/28/19 0951 03/01/19 0344 03/02/19 0439  NA 134* 140 141  K 3.9 3.7 3.8  CL 105 111 112*  CO2 21* 22 22  GLUCOSE 123* 90 95  BUN _2 CREATININE 0.97 0.98 1.01*  CALCIUM 8.6* 8.3* 8.6*  MG  --   --  2.0  PHOS  --   --  3.8   GFR: Estimated Creatinine Clearance: 59.5 mL/min (A) (by C-G formula based on SCr of 1.01 mg/dL (H)). Liver Function Tests: Recent Labs  Lab 02/28/19 0951 03/01/19 0344 03/02/19 0439  AST 709* 643* 386*  ALT 313* 530* 463*  ALKPHOS 186* 224* 245*  BILITOT 1.3* 1.6* 1.5*  PROT 8.0 7.0 7.1  ALBUMIN 3.5 3.0* 3.1*   Recent Labs  Lab 02/28/19 0951  LIPASE 45   No results for input(s): AMMONIA in the last 168 hours. Coagulation Profile: No results for input(s): INR, PROTIME in the last 168 hours. Cardiac Enzymes: No results for input(s): CKTOTAL, CKMB, CKMBINDEX, TROPONINI in the last 168 hours. BNP (last 3 results) No results for input(s): PROBNP in the last 8760 hours. HbA1C: Recent Labs    03/02/19 0439  HGBA1C 5.5   CBG: Recent Labs  Lab 03/01/19 1159  GLUCAP 128*   Lipid Profile: No results for input(s): CHOL, HDL, LDLCALC, TRIG, CHOLHDL, LDLDIRECT in the last 72 hours. Thyroid Function Tests: No results for input(s): TSH, T4TOTAL, FREET4, T3FREE, THYROIDAB in the last 72 hours. Anemia Panel: Recent Labs    03/02/19 0439  VITAMINB12 598  FOLATE 11.8  FERRITIN 83  TIBC 307  IRON 99  RETICCTPCT 1.2   Sepsis Labs: No results for input(s): PROCALCITON, LATICACIDVEN in the last 168  hours.  No results found for this or any previous visit (from the past 240 hour(s)).    Radiology Studies: Dg Cholangiogram Operative  Result Date: 03/01/2019 CLINICAL DATA:  Operative cholangiogram for cholecystitis, 28 sec fluoro time EXAM: INTRAOPERATIVE CHOLANGIOGRAM TECHNIQUE: Cholangiographic images from the C-arm fluoroscopic device were submitted for interpretation post-operatively. Please see the procedural report for the amount of contrast and the fluoroscopy time utilized. COMPARISON:  CT from the previous day FINDINGS: No mobile filling defects in the common duct. Intrahepatic ducts are incompletely visualized, appearing mildly distended centrally. There is a subtle meniscus in the distal CBD. No contrast is seen to pass into the duodenum. No opacification of the pancreatic duct. : 1. No passage of contrast into the duodenum suggesting obstructing distal common duct stone. Electronically Signed   By: Lucrezia Europe M.D.   On: 03/01/2019 11:51   Ct Abdomen Pelvis W Contrast  Result Date: 02/28/2019 CLINICAL DATA:  63 year old with acute onset generalized abdominal pain and nausea that began at 2 o'clock this morning. Current history of treated HIV. Surgical history includes cesarean section. EXAM: CT ABDOMEN AND PELVIS WITH CONTRAST TECHNIQUE: Multidetector CT imaging of the abdomen and pelvis was performed using the standard protocol following bolus administration of intravenous contrast. CONTRAST:  183m OMNIPAQUE IOHEXOL 300 MG/ML IV. COMPARISON:  None. FINDINGS: Lower chest: Mild atelectasis and/or scarring involving the BILATERAL lower lobes, LEFT greater than RIGHT, and the lingula. Heart size upper normal. Hepatobiliary: Liver normal in size and appearance. Mild gallbladder wall thickening. No calcified gallstones. No convincing pericholecystic edema/inflammation. No biliary ductal dilation. Pancreas: Normal in appearance without evidence of mass, ductal dilation, or inflammation. Spleen:  Normal in size and appearance. Adrenals/Urinary Tract: Normal appearing adrenal glands. Subcentimeter benign cortical cyst involving the LOWER pole the RIGHT kidney. No significant parenchymal abnormalities involving either kidney. No hydronephrosis. No urinary tract calculi. Normal appearing urinary bladder. Stomach/Bowel: Gas-filled diverticulum arising from the distal esophagus near the EG junction. Stomach normal in appearance for the degree of distention. Normal-appearing small bowel. Extensive diffuse colonic diverticulosis without evidence of acute diverticulitis. Normal appendix in the RIGHT UPPER pelvis. Vascular/Lymphatic: Mild aortoiliac atherosclerosis without evidence of aneurysm. Normal-appearing portal venous and systemic venous systems. No pathologic lymphadenopathy. Numerous normal sized lymph nodes throughout the mesentery and retroperitoneum. Reproductive: Normal-appearing uterus and ovaries without evidence of adnexal mass. Other: None. Musculoskeletal: Broad-based central disc protrusion at L5-S1 and facet degenerative changes at this level. No acute findings. IMPRESSION: 1. Mild gallbladder wall thickening without evidence of calcified gallstones or pericholecystic edema/inflammation. Acalculous cholecystitis or  chronic cholecystitis might have this appearance. RIGHT UPPER QUADRANT abdominal ultrasound may be helpful to confirm or deny this finding. 2. No acute abnormalities otherwise involving the abdomen or pelvis. 3. Diffuse colonic diverticulosis without evidence of acute diverticulitis. 4. Gas-filled diverticulum arising from the distal esophagus near the EG junction. Aortic Atherosclerosis (ICD10-170.0) Electronically Signed   By: Evangeline Dakin M.D.   On: 02/28/2019 13:38   Mr 3d Recon At Scanner  Result Date: 03/01/2019 CLINICAL DATA:  Postop from laparoscopic cholecystectomy. Elevated liver tests. EXAM: MRI ABDOMEN WITHOUT AND WITH CONTRAST (INCLUDING MRCP) TECHNIQUE: Multiplanar  multisequence MR imaging of the abdomen was performed both before and after the administration of intravenous contrast. Heavily T2-weighted images of the biliary and pancreatic ducts were obtained, and three-dimensional MRCP images were rendered by post processing. CONTRAST:  8 mL Gadavist COMPARISON:  CT on 02/28/2019 FINDINGS: Lower chest: Tiny bilateral pleural effusions and bibasilar atelectasis. Hepatobiliary: No hepatic masses identified. Expected postop changes from recent cholecystectomy. No abnormal fluid collections in gallbladder fossa. No evidence of biliary ductal dilatation with common bile duct measuring 5 mm. No evidence of choledocholithiasis or biliary stricture. Pancreas: No mass or inflammatory changes. No evidence of pancreatic ductal dilatation or pancreas divisum. Spleen:  Within normal limits in size and appearance. Adrenals/Urinary Tract: No masses identified. No evidence of hydronephrosis. Stomach/Bowel: Visualized portion unremarkable. Vascular/Lymphatic: No pathologically enlarged lymph nodes identified. No abdominal aortic aneurysm. Other:  None. Musculoskeletal:  No suspicious bone lesions identified. IMPRESSION: Expected postop changes from recent cholecystectomy. No evidence of biliary ductal dilatation or choledocholithiasis. Tiny bilateral pleural effusions and bibasilar atelectasis. Electronically Signed   By: Earle Gell M.D.   On: 03/01/2019 19:41   Mr Abdomen Mrcp Moise Boring Contast  Result Date: 03/01/2019 CLINICAL DATA:  Postop from laparoscopic cholecystectomy. Elevated liver tests. EXAM: MRI ABDOMEN WITHOUT AND WITH CONTRAST (INCLUDING MRCP) TECHNIQUE: Multiplanar multisequence MR imaging of the abdomen was performed both before and after the administration of intravenous contrast. Heavily T2-weighted images of the biliary and pancreatic ducts were obtained, and three-dimensional MRCP images were rendered by post processing. CONTRAST:  8 mL Gadavist COMPARISON:  CT on  02/28/2019 FINDINGS: Lower chest: Tiny bilateral pleural effusions and bibasilar atelectasis. Hepatobiliary: No hepatic masses identified. Expected postop changes from recent cholecystectomy. No abnormal fluid collections in gallbladder fossa. No evidence of biliary ductal dilatation with common bile duct measuring 5 mm. No evidence of choledocholithiasis or biliary stricture. Pancreas: No mass or inflammatory changes. No evidence of pancreatic ductal dilatation or pancreas divisum. Spleen:  Within normal limits in size and appearance. Adrenals/Urinary Tract: No masses identified. No evidence of hydronephrosis. Stomach/Bowel: Visualized portion unremarkable. Vascular/Lymphatic: No pathologically enlarged lymph nodes identified. No abdominal aortic aneurysm. Other:  None. Musculoskeletal:  No suspicious bone lesions identified. IMPRESSION: Expected postop changes from recent cholecystectomy. No evidence of biliary ductal dilatation or choledocholithiasis. Tiny bilateral pleural effusions and bibasilar atelectasis. Electronically Signed   By: Earle Gell M.D.   On: 03/01/2019 19:41   Scheduled Meds:  [MAR Hold] gabapentin  300 mg Oral BID   [MAR Hold] hydrALAZINE  5 mg Intravenous Once   indomethacin  100 mg Rectal Once   [MAR Hold] senna  1 tablet Oral BID   [MAR Hold] sodium chloride flush  3 mL Intravenous Q12H   Continuous Infusions:  sodium chloride 100 mL/hr at 03/02/19 1000   ciprofloxacin     lactated ringers 1,000 mL (03/02/19 1126)   [MAR Hold] methocarbamol (ROBAXIN) IV  LOS: 0 days   Kerney Elbe, DO [facility] PAGER is on AMION  If 7PM-7AM, please contact night-coverage [email or web address] Password [facility] 03/02/2019, 12:44 PM

## 2019-03-03 ENCOUNTER — Encounter: Payer: Self-pay | Admitting: Internal Medicine

## 2019-03-03 DIAGNOSIS — Z9049 Acquired absence of other specified parts of digestive tract: Secondary | ICD-10-CM | POA: Insufficient documentation

## 2019-03-03 LAB — COMPREHENSIVE METABOLIC PANEL
ALT: 300 U/L — ABNORMAL HIGH (ref 0–44)
AST: 152 U/L — ABNORMAL HIGH (ref 15–41)
Albumin: 3 g/dL — ABNORMAL LOW (ref 3.5–5.0)
Alkaline Phosphatase: 203 U/L — ABNORMAL HIGH (ref 38–126)
Anion gap: 9 (ref 5–15)
BUN: 10 mg/dL (ref 6–20)
CO2: 23 mmol/L (ref 22–32)
Calcium: 8.4 mg/dL — ABNORMAL LOW (ref 8.9–10.3)
Chloride: 108 mmol/L (ref 98–111)
Creatinine, Ser: 1.06 mg/dL — ABNORMAL HIGH (ref 0.44–1.00)
GFR calc Af Amer: >60 mL/min (ref >60)
GFR calc non Af Amer: 57 mL/min — ABNORMAL LOW (ref >60)
Glucose, Bld: 93 mg/dL (ref 70–99)
Potassium: 3.2 mmol/L — ABNORMAL LOW (ref 3.5–5.1)
Sodium: 140 mmol/L (ref 135–145)
Total Bilirubin: 1.4 mg/dL — ABNORMAL HIGH (ref 0.3–1.2)
Total Protein: 6.9 g/dL (ref 6.5–8.1)

## 2019-03-03 LAB — CBC WITH DIFFERENTIAL/PLATELET
Abs Immature Granulocytes: 0.02 10*3/uL (ref 0.00–0.07)
Basophils Absolute: 0 10*3/uL (ref 0.0–0.1)
Basophils Relative: 0 %
Eosinophils Absolute: 0 10*3/uL (ref 0.0–0.5)
Eosinophils Relative: 1 %
HCT: 35.1 % — ABNORMAL LOW (ref 36.0–46.0)
Hemoglobin: 10.9 g/dL — ABNORMAL LOW (ref 12.0–15.0)
Immature Granulocytes: 1 %
Lymphocytes Relative: 42 %
Lymphs Abs: 1.7 10*3/uL (ref 0.7–4.0)
MCH: 28 pg (ref 26.0–34.0)
MCHC: 31.1 g/dL (ref 30.0–36.0)
MCV: 90.2 fL (ref 80.0–100.0)
Monocytes Absolute: 0.4 10*3/uL (ref 0.1–1.0)
Monocytes Relative: 10 %
Neutro Abs: 1.8 10*3/uL (ref 1.7–7.7)
Neutrophils Relative %: 46 %
Platelets: 195 10*3/uL (ref 150–400)
RBC: 3.89 MIL/uL (ref 3.87–5.11)
RDW: 13.4 % (ref 11.5–15.5)
WBC: 4 10*3/uL (ref 4.0–10.5)
nRBC: 0 % (ref 0.0–0.2)

## 2019-03-03 LAB — PHOSPHORUS: Phosphorus: 3.2 mg/dL (ref 2.5–4.6)

## 2019-03-03 LAB — MAGNESIUM: Magnesium: 1.8 mg/dL (ref 1.7–2.4)

## 2019-03-03 MED ORDER — GABAPENTIN 300 MG PO CAPS: 300.0000 mg | ORAL_CAPSULE | Freq: Two times a day (BID) | ORAL | 0 refills | Status: DC

## 2019-03-03 MED ORDER — POTASSIUM CHLORIDE CRYS ER 20 MEQ PO TBCR
40.0000 meq | EXTENDED_RELEASE_TABLET | Freq: Once | ORAL | Status: AC
  Administered 2019-03-03: 40 meq via ORAL
  Filled 2019-03-03: qty 2

## 2019-03-03 NOTE — Progress Notes (Signed)
Stephanie Espinoza [id number] 1956-05-17  CARE TEAM:  PCP: Hoyt Koch, MD  Outpatient Care Team: Patient Care Team: Hoyt Koch, MD as PCP - General (Internal Medicine) Michel Bickers, MD as PCP - Infectious Diseases (Infectious Diseases) Michael Boston, MD as Consulting Physician (General Surgery)  Inpatient Treatment Team: Treatment Team: Attending Provider: Cristal Ford, DO; Consulting Physician: Edison Pace Md, MD; Attending Physician: Terrilee Croak, MD; Consulting Physician: Laurence Spates, MD; Consulting Physician: Arta Silence, MD; Registered Nurse: Guadlupe Spanish, RN; Registered Nurse: Johna Sheriff, RN; Rounding Team: Fatima Blank, MD; Registered Nurse: Jennye Boroughs, RN; Case Manager: Frann Rider, RN; Utilization Review: Delrae Sawyers, RN   Problem List:   Principal Problem:   Common bile duct stones Active Problems:   Human immunodeficiency virus (HIV) disease ([place])   Essential hypertension   Elevated liver enzymes   Acute on chronic cholecystitis   Normocytic anemia   Hyperglycemia   Obesity (BMI 30.0-34.9)   Choledocholithiasis   1 Day Post-Op  03/02/2019  Procedure(s): FULL UPPER ENDOSCOPIC ULTRASOUND (EUS) RADIAL - There was no sign of significant pathology in the ampulla. - Suspect patient had transient ampullary spaSM -Bile duct stone unlikely; no visualized bile duct stones seen on MRCP or today's EUS.  SURGERY 03/01/2019 POST-OPERATIVE DIAGNOSIS:   Acute on Chronic Calculus Cholecystitis Distal common bile duct obstruction most likely due to choledocholithiasis  PROCEDURE:  SINGLE SITE Laparoscopic cholecystectomy with intraoperative cholangiogram  SURGEON:  Adin Hector, MD, FACS.    Assessment  Improved  [facility] Stay = 1 days)  Plan:  Okay to discharge from surgery standpoint.  Hopefully patient had small stone that passed or was just spasm.  Certainly nothing remaining at this time.  Pain  minimal liver function test nearly normal, arguing against any persistent or concerning, bile duct obstruction.  HIV management per primary service.  Blood pressure control per primary service.  -VTE prophylaxis- SCDs, etc -mobilize as tolerated to help recovery  15 minutes spent in review, evaluation, examination, counseling, and coordination of care.  More than 50% of that time was spent in counseling.  03/03/2019    Subjective: (Chief complaint)  Glad to be on solids.  Denies much in the way of pain.  In good spirits.    Wants to go home  Objective:  Vital signs:  Vitals:   03/02/19 2150 03/02/19 2155 03/02/19 2335 03/03/19 0525  BP: (!) 187/97 (!) 195/114 139/82 (!) 156/89  Pulse: 70 76 66 64  Resp:  18  18  Temp:  98.3 F (36.8 C)  97.9 F (36.6 C)  TempSrc:  Oral  Oral  SpO2:  94% 93% 95%  Weight:      Height:        Last BM Date: 02/27/19  Intake/Output   Yesterday:  04/28 0701 - 04/29 0700 In: 2513.1 [P.O.:1381; I.V.:1132.1] Out: 1850 [Urine:1850] This shift:  Total I/O In: 240 [P.O.:240] Out: 0   Bowel function:  Flatus: YES  BM:  No  Drain: (No drain)   Physical Exam:  General: Pt awake/alert/oriented x4 in no acute distress Eyes: PERRL, normal EOM.  Sclera clear.  No icterus Neuro: CN II-XII intact w/o focal sensory/motor deficits. Lymph: No head/neck/groin lymphadenopathy Psych:  No delerium/psychosis/paranoia HENT: Normocephalic, Mucus membranes moist.  No thrush Neck: Supple, No tracheal deviation Chest:  No chest wall pain w good excursion CV:  Pulses intact.  Regular rhythm MS: Normal AROM mjr joints.  No obvious deformity  Abdomen: Soft.  Nondistended.  Nontender.  Umbilical dressing clean dry and intact.  No evidence of peritonitis.  No incarcerated hernias.  Ext:   No deformity.  No mjr edema.  No cyanosis Skin: No petechiae / purpura  Results:   Labs: Results for orders placed or performed during the hospital  encounter of 02/28/19 (from the past 48 hour(s))  Glucose, capillary     Status: Abnormal   Collection Time: 03/01/19 11:59 AM  Result Value Ref Range   Glucose-Capillary 128 (H) 70 - 99 mg/dL  Comprehensive metabolic panel     Status: Abnormal   Collection Time: 03/02/19  4:39 AM  Result Value Ref Range   Sodium 141 135 - 145 mmol/L   Potassium 3.8 3.5 - 5.1 mmol/L   Chloride 112 (H) 98 - 111 mmol/L   CO2 22 22 - 32 mmol/L   Glucose, Bld 95 70 - 99 mg/dL   BUN 12 6 - 20 mg/dL   Creatinine, Ser 1.01 (H) 0.44 - 1.00 mg/dL   Calcium 8.6 (L) 8.9 - 10.3 mg/dL   Total Protein 7.1 6.5 - 8.1 g/dL   Albumin 3.1 (L) 3.5 - 5.0 g/dL   AST 386 (H) 15 - 41 U/L   ALT 463 (H) 0 - 44 U/L   Alkaline Phosphatase 245 (H) 38 - 126 U/L   Total Bilirubin 1.5 (H) 0.3 - 1.2 mg/dL   GFR calc non Af Amer >60 >60 mL/min   GFR calc Af Amer >60 >60 mL/min   Anion gap 7 5 - 15    Comment: Performed at [facility], [place] [address] [place], [place] [address]  CBC with Differential/Platelet     Status: Abnormal   Collection Time: 03/02/19  4:39 AM  Result Value Ref Range   WBC 6.0 4.0 - 10.5 K/uL   RBC 4.02 3.87 - 5.11 MIL/uL   Hemoglobin 11.1 (L) 12.0 - 15.0 g/dL   HCT 36.5 36.0 - 46.0 %   MCV 90.8 80.0 - 100.0 fL   MCH 27.6 26.0 - 34.0 pg   MCHC 30.4 30.0 - 36.0 g/dL   RDW 13.1 11.5 - 15.5 %   Platelets 207 150 - 400 K/uL   nRBC 0.0 0.0 - 0.2 %   Neutrophils Relative % 60 %   Neutro Abs 3.6 1.7 - 7.7 K/uL   Lymphocytes Relative 30 %   Lymphs Abs 1.8 0.7 - 4.0 K/uL   Monocytes Relative 9 %   Monocytes Absolute 0.5 0.1 - 1.0 K/uL   Eosinophils Relative 0 %   Eosinophils Absolute 0.0 0.0 - 0.5 K/uL   Basophils Relative 0 %   Basophils Absolute 0.0 0.0 - 0.1 K/uL   Immature Granulocytes 1 %   Abs Immature Granulocytes 0.03 0.00 - 0.07 K/uL    Comment: Performed at [facility], [place] [address] [place], [place] [address]  Magnesium     Status: None   Collection  Time: 03/02/19  4:39 AM  Result Value Ref Range   Magnesium 2.0 1.7 - 2.4 mg/dL    Comment: Performed at [facility], [place] [address] [place], [place] [address]  Phosphorus     Status: None   Collection Time: 03/02/19  4:39 AM  Result Value Ref Range   Phosphorus 3.8 2.5 - 4.6 mg/dL    Comment: Performed at [facility], [place] [address], [place], [place] [address]  Vitamin B12     Status: None   Collection  Time: 03/02/19  4:39 AM  Result Value Ref Range   Vitamin B-12 598 180 - 914 pg/mL    Comment: (NOTE) This assay is not validated for testing neonatal or myeloproliferative syndrome specimens for Vitamin B12 levels. Performed at [facility], [place] [address], [place], [place] [address]   Folate     Status: None   Collection Time: 03/02/19  4:39 AM  Result Value Ref Range   Folate 11.8 >5.9 ng/mL    Comment: Performed at [facility], [place] [address], [place], [place] [address]  Iron and TIBC     Status: Abnormal   Collection Time: 03/02/19  4:39 AM  Result Value Ref Range   Iron 99 28 - 170 ug/dL   TIBC 307 250 - 450 ug/dL   Saturation Ratios 32 (H) 10.4 - 31.8 %   UIBC 208 ug/dL    Comment: Performed at [facility], [place] [address], [place], [place] [address]  Ferritin     Status: None   Collection Time: 03/02/19  4:39 AM  Result Value Ref Range   Ferritin 83 11 - 307 ng/mL    Comment: Performed at [facility], [place] [address] [place], [place] [address]  Reticulocytes     Status: None   Collection Time: 03/02/19  4:39 AM  Result Value Ref Range   Retic Ct Pct 1.2 0.4 - 3.1 %   RBC. 4.02 3.87 - 5.11 MIL/uL   Retic Count, Absolute 47.8 19.0 - 186.0 K/uL   Immature Retic Fract 12.8 2.3 - 15.9 %    Comment: Performed at [facility], [place] [address] [place], [place] [address]  Hemoglobin A1c     Status: None   Collection Time: 03/02/19  4:39  AM  Result Value Ref Range   Hgb A1c MFr Bld 5.5 4.8 - 5.6 %    Comment: (NOTE) Pre diabetes:          5.7%-6.4% Diabetes:              >6.4% Glycemic control for   <7.0% adults with diabetes    Mean Plasma Glucose 111.15 mg/dL    Comment: Performed at [place] [address] [place],  [address]  CBC with Differential/Platelet     Status: Abnormal   Collection Time: 03/03/19  3:28 AM  Result Value Ref Range   WBC 4.0 4.0 - 10.5 K/uL   RBC 3.89 3.87 - 5.11 MIL/uL   Hemoglobin 10.9 (L) 12.0 - 15.0 g/dL   HCT 35.1 (L) 36.0 - 46.0 %   MCV 90.2 80.0 - 100.0 fL   MCH 28.0 26.0 - 34.0 pg   MCHC 31.1 30.0 - 36.0 g/dL   RDW 13.4 11.5 - 15.5 %   Platelets 195 150 - 400 K/uL   nRBC 0.0 0.0 - 0.2 %   Neutrophils Relative % 46 %   Neutro Abs 1.8 1.7 - 7.7 K/uL   Lymphocytes Relative 42 %   Lymphs Abs 1.7 0.7 - 4.0 K/uL   Monocytes Relative 10 %   Monocytes Absolute 0.4 0.1 - 1.0 K/uL   Eosinophils Relative 1 %   Eosinophils Absolute 0.0 0.0 - 0.5 K/uL   Basophils Relative 0 %   Basophils Absolute 0.0 0.0 - 0.1 K/uL   Immature Granulocytes 1 %   Abs Immature Granulocytes 0.02 0.00 - 0.07 K/uL    Comment: Performed at [facility], [place]  [address] [place], [place] [address]  Comprehensive metabolic panel     Status: Abnormal   Collection Time: 03/03/19  3:28 AM  Result Value Ref Range   Sodium 140 135 - 145 mmol/L   Potassium 3.2 (L) 3.5 - 5.1 mmol/L   Chloride 108 98 - 111 mmol/L   CO2 23 22 - 32 mmol/L   Glucose, Bld 93 70 - 99 mg/dL   BUN 10 6 - 20 mg/dL   Creatinine, Ser 1.06 (H) 0.44 - 1.00 mg/dL   Calcium 8.4 (L) 8.9 - 10.3 mg/dL   Total Protein 6.9 6.5 - 8.1 g/dL   Albumin 3.0 (L) 3.5 - 5.0 g/dL   AST 152 (H) 15 - 41 U/L   ALT 300 (H) 0 - 44 U/L   Alkaline Phosphatase 203 (H) 38 - 126 U/L   Total Bilirubin 1.4 (H) 0.3 - 1.2 mg/dL   GFR calc non Af Amer 57 (L) >60 mL/min   GFR calc Af Amer >60 >60 mL/min   Anion gap 9 5 - 15     Comment: Performed at [facility], [place] [address], [place], [place] [address]  Magnesium     Status: None   Collection Time: 03/03/19  3:28 AM  Result Value Ref Range   Magnesium 1.8 1.7 - 2.4 mg/dL    Comment: Performed at [facility], [place] [address], [place], [place] [address]  Phosphorus     Status: None   Collection Time: 03/03/19  3:28 AM  Result Value Ref Range   Phosphorus 3.2 2.5 - 4.6 mg/dL    Comment: Performed at [facility], [place] [address], [place], [place] [address]    Imaging / Studies: Dg Cholangiogram Operative  Result Date: 03/01/2019 CLINICAL DATA:  Operative cholangiogram for cholecystitis, 28 sec fluoro time EXAM: INTRAOPERATIVE CHOLANGIOGRAM TECHNIQUE: Cholangiographic images from the C-arm fluoroscopic device were submitted for interpretation post-operatively. Please see the procedural report for the amount of contrast and the fluoroscopy time utilized. COMPARISON:  CT from the previous day FINDINGS: No mobile filling defects in the common duct. Intrahepatic ducts are incompletely visualized, appearing mildly distended centrally. There is a subtle meniscus in the distal CBD. No contrast is seen to pass into the duodenum. No opacification of the pancreatic duct. : 1. No passage of contrast into the duodenum suggesting obstructing distal common duct stone. Electronically Signed   By: Lucrezia Europe M.D.   On: 03/01/2019 11:51   Mr 3d Recon At Scanner  Result Date: 03/01/2019 CLINICAL DATA:  Postop from laparoscopic cholecystectomy. Elevated liver tests. EXAM: MRI ABDOMEN WITHOUT AND WITH CONTRAST (INCLUDING MRCP) TECHNIQUE: Multiplanar multisequence MR imaging of the abdomen was performed both before and after the administration of intravenous contrast. Heavily T2-weighted images of the biliary and pancreatic ducts were obtained, and three-dimensional MRCP images were rendered by post processing. CONTRAST:  8 mL Gadavist  COMPARISON:  CT on 02/28/2019 FINDINGS: Lower chest: Tiny bilateral pleural effusions and bibasilar atelectasis. Hepatobiliary: No hepatic masses identified. Expected postop changes from recent cholecystectomy. No abnormal fluid collections in gallbladder fossa. No evidence of biliary ductal dilatation with common bile duct measuring 5 mm. No evidence of choledocholithiasis or biliary stricture. Pancreas: No mass or inflammatory changes. No evidence of pancreatic ductal dilatation or pancreas divisum. Spleen:  Within normal limits in size and appearance. Adrenals/Urinary Tract: No masses identified. No evidence of hydronephrosis. Stomach/Bowel: Visualized portion unremarkable. Vascular/Lymphatic: No pathologically enlarged lymph nodes identified. No abdominal aortic aneurysm.  Other:  None. Musculoskeletal:  No suspicious bone lesions identified. IMPRESSION: Expected postop changes from recent cholecystectomy. No evidence of biliary ductal dilatation or choledocholithiasis. Tiny bilateral pleural effusions and bibasilar atelectasis. Electronically Signed   By: Earle Gell M.D.   On: 03/01/2019 19:41   Mr Abdomen Mrcp Moise Boring Contast  Result Date: 03/01/2019 CLINICAL DATA:  Postop from laparoscopic cholecystectomy. Elevated liver tests. EXAM: MRI ABDOMEN WITHOUT AND WITH CONTRAST (INCLUDING MRCP) TECHNIQUE: Multiplanar multisequence MR imaging of the abdomen was performed both before and after the administration of intravenous contrast. Heavily T2-weighted images of the biliary and pancreatic ducts were obtained, and three-dimensional MRCP images were rendered by post processing. CONTRAST:  8 mL Gadavist COMPARISON:  CT on 02/28/2019 FINDINGS: Lower chest: Tiny bilateral pleural effusions and bibasilar atelectasis. Hepatobiliary: No hepatic masses identified. Expected postop changes from recent cholecystectomy. No abnormal fluid collections in gallbladder fossa. No evidence of biliary ductal dilatation with common  bile duct measuring 5 mm. No evidence of choledocholithiasis or biliary stricture. Pancreas: No mass or inflammatory changes. No evidence of pancreatic ductal dilatation or pancreas divisum. Spleen:  Within normal limits in size and appearance. Adrenals/Urinary Tract: No masses identified. No evidence of hydronephrosis. Stomach/Bowel: Visualized portion unremarkable. Vascular/Lymphatic: No pathologically enlarged lymph nodes identified. No abdominal aortic aneurysm. Other:  None. Musculoskeletal:  No suspicious bone lesions identified. IMPRESSION: Expected postop changes from recent cholecystectomy. No evidence of biliary ductal dilatation or choledocholithiasis. Tiny bilateral pleural effusions and bibasilar atelectasis. Electronically Signed   By: Earle Gell M.D.   On: 03/01/2019 19:41    Medications / Allergies: per chart  Antibiotics: Anti-infectives (From admission, onward)   Start     Dose/Rate Route Frequency Ordered Stop   03/02/19 1115  ciprofloxacin (CIPRO) IVPB 400 mg  Status:  Discontinued     400 mg 200 mL/hr over 60 Minutes Intravenous  Once 03/02/19 1114 03/02/19 1400   03/01/19 1045  metroNIDAZOLE (FLAGYL) 5-0.79 MG/ML-% IVPB    Note to Pharmacy:  Randa Evens  : cabinet override      03/01/19 1045 03/01/19 1048   03/01/19 0915  cefTRIAXone (ROCEPHIN) 2 g in sodium chloride 0.9 % 100 mL IVPB     2 g 200 mL/hr over 30 Minutes Intravenous On call to O.R. 03/01/19 0909 03/02/19 0559        Note: Portions of this report may have been transcribed using voice recognition software. Every effort was made to ensure accuracy; however, inadvertent computerized transcription errors may be present.   Any transcriptional errors that result from this process are unintentional.     Adin Hector, MD, FACS, MASCRS Gastrointestinal and Minimally Invasive Surgery    [address], [place], [place] [address] [phone] Main / Paging [phone] Fax

## 2019-03-03 NOTE — Discharge Instructions (Signed)
LAPAROSCOPIC SURGERY: POST OP INSTRUCTIONS ° °###################################################################### ° °EAT °Gradually transition to a high fiber diet with a fiber supplement over the next few weeks after discharge.  Start with a pureed / full liquid diet (see below) ° °WALK °Walk an hour a day.  Control your pain to do that.   ° °CONTROL PAIN °Control pain so that you can walk, sleep, tolerate sneezing/coughing, go up/down stairs. ° °HAVE A BOWEL MOVEMENT DAILY °Keep your bowels regular to avoid problems.  OK to try a laxative to override constipation.  OK to use an antidairrheal to slow down diarrhea.  Call if not better after 2 tries ° °CALL IF YOU HAVE PROBLEMS/CONCERNS °Call if you are still struggling despite following these instructions. °Call if you have concerns not answered by these instructions ° °###################################################################### ° ° ° °1. DIET: Follow a light bland diet the first 24 hours after arrival home, such as soup, liquids, crackers, etc.  Be sure to include lots of fluids daily.  Avoid fast food or heavy meals as your are more likely to get nauseated.  Eat a low fat the next few days after surgery.   ° °2. Take your usually prescribed home medications unless otherwise directed. ° °3. PAIN CONTROL: °a. Pain is best controlled by a usual combination of three different methods TOGETHER: °i. Ice/Heat °ii. Over the counter pain medication °iii. Prescription pain medication °b. Most patients will experience some swelling and bruising around the incisions.  Ice packs or heating pads (30-60 minutes up to 6 times a day) will help. Use ice for the first few days to help decrease swelling and bruising, then switch to heat to help relax tight/sore spots and speed recovery.  Some people prefer to use ice alone, heat alone, alternating between ice & heat.  Experiment to what works for you.  Swelling and bruising can take several weeks to resolve.   °c. It  is helpful to take an over-the-counter pain medication regularly for the first few weeks.  Choose one of the following that works best for you: °i. Naproxen (Aleve, etc)  Two 220mg tabs twice a day °ii. Ibuprofen (Advil, etc) Three 200mg tabs four times a day (every meal & bedtime) °iii. Acetaminophen (Tylenol, etc) 500-650mg four times a day (every meal & bedtime) °d. A  prescription for pain medication (such as oxycodone, hydrocodone, tramadol, gabapentin, methocarbamol, etc) should be given to you upon discharge.  Take your pain medication as prescribed.  °i. If you are having problems/concerns with the prescription medicine (does not control pain, nausea, vomiting, rash, itching, etc), please call us [phone] to see if we need to switch you to a different pain medicine that will work better for you and/or control your side effect better. °ii. If you need a refill on your pain medication, please give us 48 hour notice.  contact your pharmacy.  They will contact our office to request authorization. Prescriptions will not be filled after 5 pm or on week-ends ° °4. Avoid getting constipated.   °a. Between the surgery and the pain medications, it is common to experience some constipation.   °b. Increasing fluid intake and taking a fiber supplement (such as Metamucil, Citrucel, FiberCon, MiraLax, etc) 1-2 times a day regularly will usually help prevent this problem from occurring.   °c. A mild laxative (prune juice, Milk of Magnesia, MiraLax, etc) should be taken according to package directions if there are no bowel movements after 48 hours.   °5. Watch out for   diarrhea.   °a. If you have many loose bowel movements, simplify your diet to bland foods & liquids for a few days.   °b. Stop any stool softeners and decrease your fiber supplement.   °c. Switching to mild anti-diarrheal medications (Kayopectate, Pepto Bismol) can help.   °d. If this worsens or does not improve, please call us. ° °6. Wash / shower every  day.  You may shower over the dressings as they are waterproof.  Continue to shower over incision(s) after the dressing is off. ° °7. Remove your waterproof bandages 5 days after surgery.  You may leave the incision open to air.  You may replace a dressing/Band-Aid to cover the incision for comfort if you wish.  ° °8. ACTIVITIES as tolerated:   °a. You may resume regular (light) daily activities beginning the next day--such as daily self-care, walking, climbing stairs--gradually increasing activities as tolerated.  If you can walk 30 minutes without difficulty, it is safe to try more intense activity such as jogging, treadmill, bicycling, low-impact aerobics, swimming, etc. °b. Save the most intensive and strenuous activity for last such as sit-ups, heavy lifting, contact sports, etc  Refrain from any heavy lifting or straining until you are off narcotics for pain control.   °c. DO NOT PUSH THROUGH PAIN.  Let pain be your guide: If it hurts to do something, don't do it.  Pain is your body warning you to avoid that activity for another week until the pain goes down. °d. You may drive when you are no longer taking prescription pain medication, you can comfortably wear a seatbelt, and you can safely maneuver your car and apply brakes. °e. You may have sexual intercourse when it is comfortable. ° °9. FOLLOW UP in our office °a. Please call [facility] at [phone] to set up an appointment to see your surgeon in the office for a follow-up appointment approximately 2-3 weeks after your surgery. °b. Make sure that you call for this appointment the day you arrive home to insure a convenient appointment time. ° °10. IF YOU HAVE DISABILITY OR FAMILY LEAVE FORMS, BRING THEM TO THE OFFICE FOR PROCESSING.  DO NOT GIVE THEM TO YOUR DOCTOR. ° ° °WHEN TO CALL US [phone]: °1. Poor pain control °2. Reactions / problems with new medications (rash/itching, nausea, etc)  °3. Fever over 101.5 F (38.5 C) °4. Inability to  urinate °5. Nausea and/or vomiting °6. Worsening swelling or bruising °7. Continued bleeding from incision. °8. Increased pain, redness, or drainage from the incision ° ° The clinic staff is available to answer your questions during regular business hours (8:30am-5pm).  Please don’t hesitate to call and ask to speak to one of our nurses for clinical concerns.  ° If you have a medical emergency, go to the nearest emergency room or call 911. ° A surgeon from [facility] is always on call at the hospitals ° ° °[facility] °[address], [address], [place], [place]  [address] ? °MAIN: [phone] ? TOLL FREE: [phone] ?  °FAX [phone] °[email or web address] ° ° ° ° ° °Cholecystitis ° °Cholecystitis is inflammation of the gallbladder. It is often called a gallbladder attack. The gallbladder is a pear-shaped organ that lies beneath the liver on the right side of the body. The gallbladder stores bile, which is a fluid that helps the body digest fats. If bile builds up in your gallbladder, your gallbladder becomes inflamed. °This condition may occur suddenly. Cholecystitis is   a serious condition and requires treatment. °What are the causes? °The most common cause of this condition is gallstones. Gallstones can block the tube (duct) that carries bile out of your gallbladder. This causes bile to build up. °Other causes include: °· Damage to the gallbladder due to a decrease in blood flow. °· Infections in the bile ducts. °· Scars or kinks in the bile ducts. °· Tumors in the liver, pancreas, or gallbladder. °What increases the risk? °You are more likely to develop this condition if: °· You have sickle cell disease. °· You take birth control pills or use estrogen. °· You have alcoholic liver disease. °· You have liver cirrhosis. °· You have your nutrition delivered through a vein (parenteral nutrition). °· You are critically ill. °· You do not eat or drink for a long  time. This is also called "fasting." °· You are obese. °· You lose weight too fast. °· You are pregnant. °· You have high levels of fat (triglycerides) in the blood. °· You have pancreatitis. °What are the signs or symptoms? °Symptoms of this condition include: °· Pain in the abdomen, especially in the upper right area of the abdomen. °· Tenderness or bloating in the abdomen. °· Nausea. °· Vomiting. °· Fever. °· Chills. °How is this diagnosed? °This condition is diagnosed with a medical history and physical exam. You may also have other tests, including: °· Imaging tests, such as: °? An ultrasound of the gallbladder. °? A CT scan of the abdomen. °? A gallbladder nuclear scan (HIDA scan). This scan allows your health care provider to see the bile moving from your liver to your gallbladder and on to your small intestine. °? MRI. °· Blood tests, such as: °? A complete blood count. The white blood cell count may be higher than normal. °? Liver function tests. Certain types of gallstones cause some results to be higher than normal. °How is this treated? °Treatment may include: °· Surgery to remove your gallbladder (cholecystectomy). °· Antibiotic medicine, usually through an IV. °· Fasting for a certain amount of time. °· Giving IV fluids. °· Medicine to treat pain or vomiting. °Follow these instructions at home: °· If you had surgery, follow instructions from your health care provider about home care after the procedure. °Medicines ° °· Take over-the-counter and prescription medicines only as told by your health care provider. °· If you were prescribed an antibiotic medicine, take it as told by your health care provider. Do not stop taking the antibiotic even if you start to feel better. °General instructions °· Follow instructions from your health care provider about what to eat or drink. When you are allowed to eat, avoid eating or drinking anything that triggers your symptoms. °· Do not lift anything that is heavier  than 10 lb (4.5 kg), or the limit that you are told, until your health care provider says that it is safe. °· Do not use any products that contain nicotine or tobacco, such as cigarettes and e-cigarettes. If you need help quitting, ask your health care provider. °· Keep all follow-up visits as told by your health care provider. This is important. °Contact a health care provider if: °· Your pain is not controlled with medicine. °· You have a fever. °Get help right away if: °· Your pain moves to another part of your abdomen or to your back. °· You continue to have symptoms or you develop new symptoms even with treatment. °Summary °· Cholecystitis is inflammation of the gallbladder. °· The most   common cause of this condition is gallstones. Gallstones can block the tube (duct) that carries bile out of your gallbladder. °· Common symptoms are pain in the abdomen, nausea, vomiting, fever, and chills. °· This condition is treated with surgery to remove the gallbladder, medicines, fasting, and IV fluids. °· Follow your health care provider's instructions for eating and drinking. Avoid eating anything that triggers your symptoms. °This information is not intended to replace advice given to you by your health care provider. Make sure you discuss any questions you have with your health care provider. °Document Released: 10/21/2005 Document Revised: 02/27/2018 Document Reviewed: 02/27/2018 °[employer] Interactive Patient Education © 2019 [employer] ° °

## 2019-03-03 NOTE — Discharge Summary (Signed)
Physician Discharge Summary  Stephanie Espinoza IRW:[id number] DOB: 1956/03/09 DOA: 02/28/2019  PCP: Hoyt Koch, MD  Admit date: 02/28/2019 Discharge date: 03/03/2019  Time spent: 45  minutes  Recommendations for Outpatient Follow-up:  Patient will be discharged to home.  Patient will need to follow up with primary care provider within one week of discharge, repeat CBC.  Follow up with gastroenterology and repeat CMP.  Patient should continue medications as prescribed.  Patient should follow a soft diet.   Discharge Diagnoses:  Principal Problem: Abdominal pain secondary to acute on chronic cholecystitis and suspected choledocholithiasis Active Problems: Elevated LFTs and hyperbilirubinemia HIV Essential hypertension Normocytic anemia Hyperglycemia History of Bell's palsy Acute renal insufficiency Obesity Hypokalemia  Discharge Condition: Stable  Diet recommendation: soft  Filed Weights   02/28/19 1807 03/02/19 1116  Weight: 83.9 kg 83.9 kg    History of present illness:  On 02/28/2019 by Dr. Marlowe Aschoff [place] JAMESYN MOOREFIELD is a 63 y.o. female with HIV, hypertension who presented to an urgent care this morning with complaint of abdominal pain and nausea since 2 AM this morning.  Denies vomiting or diarrhea.  No fever.  Pain localized right upper quadrant, no relieving factor.  Worsens with certain movements and palpation.  She was sent to the ED from urgent care. In the ED, patient was afebrile, heart rate in 60s and 70s, breathing comfortably on room air, blood pressure elevated mostly up to 167/95. Labs showed WBC count normal at 6.6, hemoglobin 11.7, platelet 201, sodium level slightly low at 134, potassium 3.9, BUN/creatinine 12/0.97, liver enzymes with alk phos/AST/ALT/total bili respectively elevated to 186/709/313/1.3.  CT abdomen pelvis showed mild gallbladder wall thickening without evidence of calcified gallstones or pericholecystic edema/inflammation suggestive  acalculous cholecystitis or chronic cholecystitis. Ultrasound abdomen pelvis showed cholelithiasis without sonographic features of acute cholecystitis.   Hospital Course:  Abdominal pain secondary to acute on chronic cholecystitis and suspected choledocholithiasis -Patient presented with right upper quadrant pain associated with nausea and elevated liver enzymes -CT abdomen pelvis as above -Ultrasound showed cholelithiasis without evidence of acute cholecystitis.  No CBD dilation -General surgery consulted and appreciated, status post laparoscopic surgical intervention with IOC, cholecystectomy -MRCP showed postop changes from recent cholecystectomy, no evidence of biliary ductal dilatation or choledocholithiasis.  Tiny bilateral pleural effusions and bibasilar atelectasis -GI consulted and appreciated, status post EUS: no evidence of significant pathology in left lobe of liver, CBD, pancreatic head, genu of the pancreas, and pancreatic uncinate process, ampulla. Suspect patient had transient ampullary spasm. Bile duct stone unlikely.  -Acute hepatitis panel negative -Patient was placed on ceftriaxone -Patient will need to follow up with GI as an outpatient and follow up on LFTs  Elevated LFTs and hyperbilirubinemia -Secondary to the above -Currently trending downward -Repeat CMP in 1 week  HIV -Patient states has been positive for HIV over the last 30 years -Currently not on any antiviral medications -Follows with infectious disease on a regular basis -Patient also with history of abnormal Pap smear and history of genital herpes and syphilis  Essential hypertension -Currently on no home medications -Patient was placed on IV hydralazine as needed however it seems that she has refused -BP currently stable -Patient to follow-up with her PCP to discuss further  Normocytic anemia -Anemia panel showed iron of 99, U IBC 208, TIBC 307, saturation ratio 32%, ferritin 83, folate 11.8, vitamin  B12 598 -Suspect drop due to delusional component as well surgical intervention -hemoglobin currently 10.9 (review of chart showed hemoglobin  baseline around 11) -Repeat CBC in 1 week  Hyperglycemia -Hemoglobin A1c 5.5  History of Bell's palsy -Stable, no residual symptoms  Acute renal insufficiency -Creatinine 1.06, baseline 0.97 -Upon review of chart, Creatiine was 1.11 in March 2020 -repeat in one week  Obesity -BMI 33.83 -Patient to follow-up with her PCP to discuss lifestyle modifications  Hypokalemia -replaced, repeat in one week  CODE STATUS: DNR  Procedures: Single site laparoscopic cholecystectomy with intraoperative cholangiogram MRCP EUS  Consultations: General surgery [facility]  Discharge Exam: Vitals:   03/02/19 2335 03/03/19 0525  BP: 139/82 (!) 156/89  Pulse: 66 64  Resp:  18  Temp:  97.9 F (36.6 C)  SpO2: 93% 95%     General: Well developed, well nourished, NAD, appears stated age  HEENT: NCAT, mucous membranes moist.  Cardiovascular: S1 S2 auscultated, RRR, no murmur  Respiratory: Clear to auscultation bilaterally with equal chest rise  Abdomen: Soft, obese, nontender, nondistended, + bowel sounds, umbilical dressing in place- clean and dry  Extremities: warm dry without cyanosis clubbing or edema  Neuro: AAOx3, nonfocal  Psych: Normal affect and demeanor, pleasant  Discharge Instructions Discharge Instructions    Discharge instructions   Complete by:  As directed    Patient will be discharged to home.  Patient will need to follow up with primary care provider within one week of discharge, repeat CBC.  Follow up with gastroenterology and repeat CMP.  Patient should continue medications as prescribed.  Patient should follow a soft diet.     Allergies as of 03/03/2019      Reactions   Haldol [haloperidol] Other (See Comments)   Wipes out her memory   Nevirapine    REACTION: Rash 10/08   Thorazine [chlorpromazine]  Other (See Comments)   "makes me comatose"      Medication List    TAKE these medications   albuterol 108 (90 Base) MCG/ACT inhaler Commonly known as:  VENTOLIN HFA Inhale 2 puffs into the lungs every 6 (six) hours as needed for wheezing or shortness of breath.   gabapentin 300 MG capsule Commonly known as:  NEURONTIN Take 1 capsule (300 mg total) by mouth 2 (two) times daily.   valACYclovir 500 MG tablet Commonly known as:  VALTREX 1 po q day and increase to bid x 3 days with symptoms.      Allergies  Allergen Reactions   Haldol [Haloperidol] Other (See Comments)    Wipes out her memory   Nevirapine     REACTION: Rash 10/08   Thorazine [Chlorpromazine] Other (See Comments)    "makes me comatose"   Follow-up Information    Sur[facility], [place] Follow up on 03/16/2019.   Specialty:  General Surgery Why:  Your follow up will be a phone call appointment  (due to the [facility] virus, to limit exposure.) Carlena Hurl will call you at [facility].  Please email a picture if you have an concerns with you wound to : [email or web address]  Contact information: [address] [place] [address] [phone]        Hoyt Koch, MD Follow up.   Specialty:  Internal Medicine Why:  Follow up for medical issues and let them know you had surgery Contact information: [place] [phone]        Michel Bickers, MD .   Specialty:  Infectious Diseases Contact information: [place] [address] [phone]  The results of significant diagnostics from this hospitalization (including imaging, microbiology, ancillary and laboratory) are listed below for reference.    Significant Diagnostic Studies: Dg Cholangiogram Operative  Result Date: 03/01/2019 CLINICAL DATA:  Operative cholangiogram for cholecystitis, 28 sec fluoro time EXAM: INTRAOPERATIVE CHOLANGIOGRAM  TECHNIQUE: Cholangiographic images from the C-arm fluoroscopic device were submitted for interpretation post-operatively. Please see the procedural report for the amount of contrast and the fluoroscopy time utilized. COMPARISON:  CT from the previous day FINDINGS: No mobile filling defects in the common duct. Intrahepatic ducts are incompletely visualized, appearing mildly distended centrally. There is a subtle meniscus in the distal CBD. No contrast is seen to pass into the duodenum. No opacification of the pancreatic duct. : 1. No passage of contrast into the duodenum suggesting obstructing distal common duct stone. Electronically Signed   By: Lucrezia Europe M.D.   On: 03/01/2019 11:51   Ct Abdomen Pelvis W Contrast  Result Date: 02/28/2019 CLINICAL DATA:  63 year old with acute onset generalized abdominal pain and nausea that began at 2 o'clock this morning. Current history of treated HIV. Surgical history includes cesarean section. EXAM: CT ABDOMEN AND PELVIS WITH CONTRAST TECHNIQUE: Multidetector CT imaging of the abdomen and pelvis was performed using the standard protocol following bolus administration of intravenous contrast. CONTRAST:  146m OMNIPAQUE IOHEXOL 300 MG/ML IV. COMPARISON:  None. FINDINGS: Lower chest: Mild atelectasis and/or scarring involving the BILATERAL lower lobes, LEFT greater than RIGHT, and the lingula. Heart size upper normal. Hepatobiliary: Liver normal in size and appearance. Mild gallbladder wall thickening. No calcified gallstones. No convincing pericholecystic edema/inflammation. No biliary ductal dilation. Pancreas: Normal in appearance without evidence of mass, ductal dilation, or inflammation. Spleen: Normal in size and appearance. Adrenals/Urinary Tract: Normal appearing adrenal glands. Subcentimeter benign cortical cyst involving the LOWER pole the RIGHT kidney. No significant parenchymal abnormalities involving either kidney. No hydronephrosis. No urinary tract calculi.  Normal appearing urinary bladder. Stomach/Bowel: Gas-filled diverticulum arising from the distal esophagus near the EG junction. Stomach normal in appearance for the degree of distention. Normal-appearing small bowel. Extensive diffuse colonic diverticulosis without evidence of acute diverticulitis. Normal appendix in the RIGHT UPPER pelvis. Vascular/Lymphatic: Mild aortoiliac atherosclerosis without evidence of aneurysm. Normal-appearing portal venous and systemic venous systems. No pathologic lymphadenopathy. Numerous normal sized lymph nodes throughout the mesentery and retroperitoneum. Reproductive: Normal-appearing uterus and ovaries without evidence of adnexal mass. Other: None. Musculoskeletal: Broad-based central disc protrusion at L5-S1 and facet degenerative changes at this level. No acute findings. IMPRESSION: 1. Mild gallbladder wall thickening without evidence of calcified gallstones or pericholecystic edema/inflammation. Acalculous cholecystitis or chronic cholecystitis might have this appearance. RIGHT UPPER QUADRANT abdominal ultrasound may be helpful to confirm or deny this finding. 2. No acute abnormalities otherwise involving the abdomen or pelvis. 3. Diffuse colonic diverticulosis without evidence of acute diverticulitis. 4. Gas-filled diverticulum arising from the distal esophagus near the EG junction. Aortic Atherosclerosis (ICD10-170.0) Electronically Signed   By: TEvangeline DakinM.D.   On: 02/28/2019 13:38   Mr 3d Recon At Scanner  Result Date: 03/01/2019 CLINICAL DATA:  Postop from laparoscopic cholecystectomy. Elevated liver tests. EXAM: MRI ABDOMEN WITHOUT AND WITH CONTRAST (INCLUDING MRCP) TECHNIQUE: Multiplanar multisequence MR imaging of the abdomen was performed both before and after the administration of intravenous contrast. Heavily T2-weighted images of the biliary and pancreatic ducts were obtained, and three-dimensional MRCP images were rendered by post processing. CONTRAST:   8 mL Gadavist COMPARISON:  CT on 02/28/2019 FINDINGS: Lower chest: Tiny bilateral pleural effusions and  bibasilar atelectasis. Hepatobiliary: No hepatic masses identified. Expected postop changes from recent cholecystectomy. No abnormal fluid collections in gallbladder fossa. No evidence of biliary ductal dilatation with common bile duct measuring 5 mm. No evidence of choledocholithiasis or biliary stricture. Pancreas: No mass or inflammatory changes. No evidence of pancreatic ductal dilatation or pancreas divisum. Spleen:  Within normal limits in size and appearance. Adrenals/Urinary Tract: No masses identified. No evidence of hydronephrosis. Stomach/Bowel: Visualized portion unremarkable. Vascular/Lymphatic: No pathologically enlarged lymph nodes identified. No abdominal aortic aneurysm. Other:  None. Musculoskeletal:  No suspicious bone lesions identified. IMPRESSION: Expected postop changes from recent cholecystectomy. No evidence of biliary ductal dilatation or choledocholithiasis. Tiny bilateral pleural effusions and bibasilar atelectasis. Electronically Signed   By: Earle Gell M.D.   On: 03/01/2019 19:41   Mr Abdomen Mrcp Moise Boring Contast  Result Date: 03/01/2019 CLINICAL DATA:  Postop from laparoscopic cholecystectomy. Elevated liver tests. EXAM: MRI ABDOMEN WITHOUT AND WITH CONTRAST (INCLUDING MRCP) TECHNIQUE: Multiplanar multisequence MR imaging of the abdomen was performed both before and after the administration of intravenous contrast. Heavily T2-weighted images of the biliary and pancreatic ducts were obtained, and three-dimensional MRCP images were rendered by post processing. CONTRAST:  8 mL Gadavist COMPARISON:  CT on 02/28/2019 FINDINGS: Lower chest: Tiny bilateral pleural effusions and bibasilar atelectasis. Hepatobiliary: No hepatic masses identified. Expected postop changes from recent cholecystectomy. No abnormal fluid collections in gallbladder fossa. No evidence of biliary ductal dilatation  with common bile duct measuring 5 mm. No evidence of choledocholithiasis or biliary stricture. Pancreas: No mass or inflammatory changes. No evidence of pancreatic ductal dilatation or pancreas divisum. Spleen:  Within normal limits in size and appearance. Adrenals/Urinary Tract: No masses identified. No evidence of hydronephrosis. Stomach/Bowel: Visualized portion unremarkable. Vascular/Lymphatic: No pathologically enlarged lymph nodes identified. No abdominal aortic aneurysm. Other:  None. Musculoskeletal:  No suspicious bone lesions identified. IMPRESSION: Expected postop changes from recent cholecystectomy. No evidence of biliary ductal dilatation or choledocholithiasis. Tiny bilateral pleural effusions and bibasilar atelectasis. Electronically Signed   By: Earle Gell M.D.   On: 03/01/2019 19:41   US Abdomen Limited Ruq  Result Date: 02/28/2019 CLINICAL DATA:  RIGHT upper quadrant pain. EXAM: ULTRASOUND ABDOMEN LIMITED RIGHT UPPER QUADRANT COMPARISON:  None. FINDINGS: Gallbladder: Multiple shadowing gallstones and sludge. Difficult to measure a discrete calculus single dimension. Wall thickness 2.0 mm. Negative sonographic Murphy's sign. Common bile duct: Diameter: Normal, 5 mm. Liver: No focal lesion identified. Within normal limits in parenchymal echogenicity. Portal vein is patent on color Doppler imaging with normal direction of blood flow towards the liver. IMPRESSION: Cholelithiasis without sonographic features of acute cholecystitis. Electronically Signed   By: Staci Righter M.D.   On: 02/28/2019 10:50    Microbiology: No results found for this or any previous visit (from the past 240 hour(s)).   Labs: Basic Metabolic Panel: Recent Labs  Lab 02/28/19 0951 03/01/19 0344 03/02/19 0439 03/03/19 0328  NA 134* 140 141 140  K 3.9 3.7 3.8 3.2*  CL 105 111 112* 108  CO2 21* 22 22 23   GLUCOSE 123* 90 95 93  BUN 12 9 12 10   CREATININE 0.97 0.98 1.01* 1.06*  CALCIUM 8.6* 8.3* 8.6* 8.4*  MG   --   --  2.0 1.8  PHOS  --   --  3.8 3.2   Liver Function Tests: Recent Labs  Lab 02/28/19 0951 03/01/19 0344 03/02/19 0439 03/03/19 0328  AST 709* 643* 386* 152*  ALT 313* 530* 463* 300*  ALKPHOS  186* 224* 245* 203*  BILITOT 1.3* 1.6* 1.5* 1.4*  PROT 8.0 7.0 7.1 6.9  ALBUMIN 3.5 3.0* 3.1* 3.0*   Recent Labs  Lab 02/28/19 0951  LIPASE 45   No results for input(s): AMMONIA in the last 168 hours. CBC: Recent Labs  Lab 02/28/19 0951 03/01/19 0344 03/02/19 0439 03/03/19 0328  WBC 6.6 4.0 6.0 4.0  NEUTROABS 4.4  --  3.6 1.8  HGB 11.7* 10.5* 11.1* 10.9*  HCT 37.8 34.8* 36.5 35.1*  MCV 90.2 91.1 90.8 90.2  PLT 201 189 207 195   Cardiac Enzymes: No results for input(s): CKTOTAL, CKMB, CKMBINDEX, TROPONINI in the last 168 hours. BNP: BNP (last 3 results) No results for input(s): BNP in the last 8760 hours.  ProBNP (last 3 results) No results for input(s): PROBNP in the last 8760 hours.  CBG: Recent Labs  Lab 03/01/19 1159  GLUCAP 128*       Signed:  Cristal Ford  [facility] 03/03/2019, 10:19 AM

## 2019-03-03 NOTE — Progress Notes (Signed)
Discharge instructions given to to pt and all questions were answered.

## 2019-03-04 ENCOUNTER — Encounter (HOSPITAL_COMMUNITY): Payer: Self-pay | Admitting: Gastroenterology

## 2019-03-09 NOTE — Anesthesia Postprocedure Evaluation (Signed)
Anesthesia Post Note  Patient: Stephanie Espinoza  Procedure(s) Performed: LAPAROSCOPIC CHOLECYSTECTOMY SINGLE SITE/ INTRAOPERATIVE CHOLANGIOGRAM (N/A )     Patient location during evaluation: PACU Anesthesia Type: General Level of consciousness: awake and alert Pain management: pain level controlled Vital Signs Assessment: post-procedure vital signs reviewed and stable Respiratory status: spontaneous breathing, nonlabored ventilation, respiratory function stable and patient connected to nasal cannula oxygen Cardiovascular status: blood pressure returned to baseline and stable Postop Assessment: no apparent nausea or vomiting Anesthetic complications: no    Last Vitals:  Vitals:   03/02/19 2335 03/03/19 0525  BP: 139/82 (!) 156/89  Pulse: 66 64  Resp:  18  Temp:  36.6 C  SpO2: 93% 95%    Last Pain:  Vitals:   03/03/19 0900  TempSrc:   PainSc: 0-No pain                 Cassandra Mcmanaman

## 2019-04-16 ENCOUNTER — Encounter: Payer: Self-pay | Admitting: Internal Medicine

## 2019-04-16 ENCOUNTER — Ambulatory Visit (INDEPENDENT_AMBULATORY_CARE_PROVIDER_SITE_OTHER): Payer: BC Managed Care – PPO | Admitting: Internal Medicine

## 2019-04-16 ENCOUNTER — Other Ambulatory Visit: Payer: BC Managed Care – PPO

## 2019-04-16 ENCOUNTER — Telehealth: Payer: Self-pay

## 2019-04-16 ENCOUNTER — Telehealth: Payer: Self-pay | Admitting: General Practice

## 2019-04-16 DIAGNOSIS — R6889 Other general symptoms and signs: Secondary | ICD-10-CM

## 2019-04-16 DIAGNOSIS — Z20822 Contact with and (suspected) exposure to covid-19: Secondary | ICD-10-CM | POA: Insufficient documentation

## 2019-04-16 MED ORDER — AMOXICILLIN-POT CLAVULANATE 875-125 MG PO TABS: 1.0000 | ORAL_TABLET | Freq: Two times a day (BID) | ORAL | 0 refills | Status: DC

## 2019-04-16 NOTE — Assessment & Plan Note (Signed)
Getting tested as she is moderate risk with concurrent HIV and not treated. She is having low grade temp to 99-100 yesterday and new sinus drainage, lack of smell, fatigue, muscle aches, chills. Advised to self-isolate unless leaving house for testing until testing returns. Cannot leave house to work.

## 2019-04-16 NOTE — Addendum Note (Signed)
Addended by: Denman George on: 04/16/2019 11:54 AM   Modules accepted: Orders

## 2019-04-16 NOTE — Telephone Encounter (Signed)
Patient is at moderate risk of getting covid-19 patient now has new onset body aches, fever, lack of smell, and nasal drainage. Would like patient to be tested for covid-19

## 2019-04-16 NOTE — Telephone Encounter (Signed)
Pt has been scheduled for Covid-19 testing.   Pt was referred by: Pricilla Holm MD

## 2019-04-16 NOTE — Progress Notes (Signed)
Virtual Visit via Audio-only Note  I connected with Marquis Lunch on 04/16/19 at 10:00 AM EDT by an audio enabled telemedicine application and verified that I am speaking with the correct person using two identifiers.  The patient and the provider were at separate locations throughout the entire encounter.   I discussed the limitations of evaluation and management by telemedicine and the availability of in person appointments. The patient expressed understanding and agreed to proceed.  History of Present Illness: The patient is a 63 y.o. female with visit for new sinus symptoms. Started 1-2 days ago with fevers low grade and lack of smell and drainage. Has difficulty breathing through nose but denies SOB or cough. Is having chills and fatigue.She feels that this is like her normal sinus infection. She does have concurrent HIV which is not treated. She is sheltering at home but is required to leave the house 1 hour 2-3 times per week for her job. Denies known exposure to positive or sick individual. Overall it is worsening. Has tried allergy medication which did not help  Observations/Objective: Voice normal and strong, no dyspnea during conversation, A and O times 3  Assessment and Plan: See problem oriented charting  Follow Up Instructions: isolation immediately and arrange testing for covid-19. Rx augmentin to cover for potential sinus infection. Reinforced several times that she is to isolate immediately and not stop isolating until test results return.   I discussed the assessment and treatment plan with the patient. The patient was provided an opportunity to ask questions and all were answered. The patient agreed with the plan and demonstrated an understanding of the instructions.   The patient was advised to call back or seek an in-person evaluation if the symptoms worsen or if the condition fails to improve as anticipated.  Hoyt Koch, MD

## 2019-04-18 LAB — NOVEL CORONAVIRUS, NAA: SARS-CoV-2, NAA: NOT DETECTED

## 2019-04-23 ENCOUNTER — Other Ambulatory Visit: Payer: Self-pay

## 2019-04-23 ENCOUNTER — Encounter: Payer: Self-pay | Admitting: Internal Medicine

## 2019-04-23 ENCOUNTER — Encounter: Payer: Self-pay | Admitting: Obstetrics & Gynecology

## 2019-04-23 ENCOUNTER — Ambulatory Visit: Payer: BC Managed Care – PPO | Admitting: Obstetrics & Gynecology

## 2019-04-23 ENCOUNTER — Other Ambulatory Visit (HOSPITAL_COMMUNITY)
Admission: RE | Admit: 2019-04-23 | Discharge: 2019-04-23 | Disposition: A | Discharge status: Home / Self Care | Payer: BC Managed Care – PPO | Source: Ambulatory Visit | Attending: Obstetrics & Gynecology | Admitting: Obstetrics & Gynecology

## 2019-04-23 VITALS — BP 132/86 | HR 76 | Temp 97.3°F | Ht 61.75 in | Wt 179.0 lb

## 2019-04-23 DIAGNOSIS — Z124 Encounter for screening for malignant neoplasm of cervix: Secondary | ICD-10-CM | POA: Diagnosis not present

## 2019-04-23 DIAGNOSIS — B977 Papillomavirus as the cause of diseases classified elsewhere: Secondary | ICD-10-CM | POA: Diagnosis not present

## 2019-04-23 DIAGNOSIS — E2839 Other primary ovarian failure: Secondary | ICD-10-CM | POA: Diagnosis not present

## 2019-04-23 DIAGNOSIS — R29890 Loss of height: Secondary | ICD-10-CM

## 2019-04-23 DIAGNOSIS — Z01419 Encounter for gynecological examination (general) (routine) without abnormal findings: Secondary | ICD-10-CM

## 2019-04-23 NOTE — Progress Notes (Signed)
63 y.o. [id number] Divorced AAF here for AEX.  Doing well.  Hall gallbladder removed 4/27 and then had an endoscopy.  Has done well since surgery.  Pain has resolved.    Denies vaginal bleeding.    Patient's last menstrual period was 11/04/1997.          Sexually active: Yes.    The current method of family planning is post menopausal status.    Exercising: Yes.    walk, yoga  Smoker:  no  Health Maintenance: Pap:  02/06/18 Neg. HR HPV:+detected. # 16, 18/45 neg  10/10/16 Neg. HR HPV:+detected. #16/18 Neg  History of abnormal Pap:  yes MMG:  09/04/17 BIRADS1:neg  Colonoscopy:  Cologuard 02/07/18 Neg.  Repeat in 2022. BMD:   No TDaP:  2017 Pneumonia vaccine(s):  2011 Shingrix:  Declines this right now Hep C testing: 02/28/19 neg (tested with the RUQ pain) Screening Labs: PCP   reports that she quit smoking about 29 years ago. Her smoking use included cigarettes. She has never used smokeless tobacco. She reports that she does not drink alcohol or use drugs.  Past Medical History:  Diagnosis Date  . Abnormal Pap smear of cervix before 1995   always a repeat back to normal except for 1 time and had colpo bioosy -resutls were normal  . Allergic rhinitis   . Anxiety   . Bell's palsy after 1995 ?   no residual SE.  [name] Depression   . Genital herpes   . History of drug abuse ([place])    drug usser [place].  cocaine  . HIV (human immunodeficiency virus infection) ([place]) 1990   from sexual contact who is unknown  . Syphilis    treated    Past Surgical History:  Procedure Laterality Date  . CESAREAN SECTION  1997  . ESOPHAGOGASTRODUODENOSCOPY (EGD) WITH PROPOFOL N/A 03/02/2019   Procedure: ESOPHAGOGASTRODUODENOSCOPY (EGD) WITH PROPOFOL;  Surgeon: Arta Silence, MD;  Location: [facility];  Service: Endoscopy;  Laterality: N/A;  . EUS N/A 03/02/2019   Procedure: FULL UPPER ENDOSCOPIC ULTRASOUND (EUS) RADIAL;  Surgeon: Arta Silence, MD;  Location: [facility];  Service: Endoscopy;   Laterality: N/A;  . LAPAROSCOPIC CHOLECYSTECTOMY SINGLE PORT N/A 03/01/2019   Procedure: LAPAROSCOPIC CHOLECYSTECTOMY SINGLE SITE/ INTRAOPERATIVE CHOLANGIOGRAM;  Surgeon: Michael Boston, MD;  Location: [facility];  Service: General;  Laterality: N/A;    Current Outpatient Medications  Medication Sig Dispense Refill  . amoxicillin-clavulanate (AUGMENTIN) 875-125 MG tablet Take 1 tablet by mouth 2 (two) times daily. 20 tablet 0  . albuterol (PROVENTIL HFA;VENTOLIN HFA) 108 (90 Base) MCG/ACT inhaler Inhale 2 puffs into the lungs every 6 (six) hours as needed for wheezing or shortness of breath. (Patient not taking: Reported on 04/23/2019) 1 Inhaler 2  . valACYclovir (VALTREX) 500 MG tablet 1 po q day and increase to bid x 3 days with symptoms. (Patient not taking: Reported on 04/23/2019) 90 tablet 2   No current facility-administered medications for this visit.     Family History  Problem Relation Age of Onset  . Diabetes Father   . Breast cancer Other     Review of Systems  All other systems reviewed and are negative.   Exam:   BP 132/86   Pulse 76   Temp (!) 97.3 F (36.3 C) (Temporal)   Ht 5' 1.75" (1.568 m)   Wt 179 lb (81.2 kg)   LMP 11/04/1997   BMI 33.01 kg/m   Height: 5' 1.75" (156.8 cm)  Ht Readings from  Last 3 Encounters:  04/23/19 5' 1.75" (1.568 m)  03/02/19 5\' 2"  (1.575 m)  09/28/18 5\' 2"  (1.575 m)    General appearance: alert, cooperative and appears stated age Head: Normocephalic, without obvious abnormality, atraumatic Neck: no adenopathy, supple, symmetrical, trachea midline and thyroid normal to inspection and palpation Lungs: clear to auscultation bilaterally Breasts: normal appearance, no masses or tenderness Heart: regular rate and rhythm Abdomen: soft, non-tender; bowel sounds normal; no masses,  no organomegaly Extremities: extremities normal, atraumatic, no cyanosis or edema Skin: Skin color, texture, turgor normal. No rashes or lesions Lymph nodes:  Cervical, supraclavicular, and axillary nodes normal. No abnormal inguinal nodes palpated Neurologic: Grossly normal   Pelvic: External genitalia:  no lesions              Urethra:  normal appearing urethra with no masses, tenderness or lesions              Bartholins and Skenes: normal                 Vagina: normal appearing vagina with normal color and discharge, no lesions              Cervix: no lesions              Pap taken: Yes.   Bimanual Exam:  Uterus:  normal size, contour, position, consistency, mobility, non-tender              Adnexa: normal adnexa and no mass, fullness, tenderness               Rectovaginal: Confirms               Anus:  normal sphincter tone, no lesions  Chaperone was present for exam.  A:  Well Woman with normal exam HIV +, not on any therapy H/O +H R HPV with neg pap and neg 16/18/45  Vaginal atrophy Recent cholecystectomy H/o vulvar HSV H/O syphilis Loss of height in last year  P:   Mammogram guidelines reviewed.  Has not done one in the past year.  Aware this is due. pap smear and HR HPV obtained today.  Reflex if HR HPV is positive to 16/18/45 testing Plan BMD with MMG.  Order placed.  Recommended due to loss of height Lab work UTD Declines shingrix vaccination Return annually or prn

## 2019-04-27 ENCOUNTER — Encounter: Payer: Self-pay | Admitting: Internal Medicine

## 2019-04-28 ENCOUNTER — Other Ambulatory Visit: Payer: Self-pay | Admitting: Internal Medicine

## 2019-04-28 ENCOUNTER — Other Ambulatory Visit: Payer: Self-pay | Admitting: *Deleted

## 2019-04-28 DIAGNOSIS — R42 Dizziness and giddiness: Secondary | ICD-10-CM

## 2019-04-28 MED ORDER — ALBUTEROL SULFATE HFA 108 (90 BASE) MCG/ACT IN AERS: 2.0000 | INHALATION_SPRAY | Freq: Four times a day (QID) | RESPIRATORY_TRACT | 2 refills | Status: DC | PRN

## 2019-04-28 MED ORDER — FLUTICASONE PROPIONATE 50 MCG/ACT NA SUSP: 2.0000 | Freq: Every day | NASAL | 2 refills | Status: DC

## 2019-04-30 LAB — CYTOLOGY - PAP
Diagnosis: NEGATIVE
HPV 16/18/45 genotyping: NEGATIVE
HPV: DETECTED — AB

## 2019-05-14 ENCOUNTER — Other Ambulatory Visit: Payer: Self-pay | Admitting: Obstetrics & Gynecology

## 2019-05-14 DIAGNOSIS — Z1231 Encounter for screening mammogram for malignant neoplasm of breast: Secondary | ICD-10-CM

## 2019-05-26 ENCOUNTER — Encounter: Payer: Self-pay | Admitting: Obstetrics & Gynecology

## 2019-06-17 ENCOUNTER — Encounter: Payer: Self-pay | Admitting: Internal Medicine

## 2019-06-18 ENCOUNTER — Encounter: Payer: Self-pay | Admitting: Obstetrics & Gynecology

## 2019-06-18 ENCOUNTER — Other Ambulatory Visit: Payer: Self-pay | Admitting: Obstetrics & Gynecology

## 2019-06-18 ENCOUNTER — Other Ambulatory Visit: Payer: Self-pay | Admitting: *Deleted

## 2019-06-18 ENCOUNTER — Encounter: Payer: Self-pay | Admitting: Internal Medicine

## 2019-06-18 MED ORDER — CLOTRIMAZOLE 1 % EX CREA: 1.0000 "application " | TOPICAL_CREAM | Freq: Two times a day (BID) | CUTANEOUS | 0 refills | Status: DC

## 2019-06-18 NOTE — Telephone Encounter (Signed)
Medication refill request: Valtrex  Last AEX:  04/23/19  Next QVZ:DGLO scheduled  Last MMG (if hormonal medication request): 09/04/17 Bi-rads 1 neg  Refill authorized: #90 with 2 RF

## 2019-06-18 NOTE — Telephone Encounter (Signed)
I'm in need of refills for the acyclovir, please thank you

## 2019-06-19 MED ORDER — VALACYCLOVIR HCL 500 MG PO TABS: ORAL_TABLET | ORAL | 2 refills | Status: DC

## 2019-06-22 ENCOUNTER — Encounter: Payer: Self-pay | Admitting: Obstetrics & Gynecology

## 2019-06-23 ENCOUNTER — Other Ambulatory Visit: Payer: Self-pay | Admitting: Obstetrics & Gynecology

## 2019-06-23 MED ORDER — ACYCLOVIR 5 % EX CREA: 1.0000 "application " | TOPICAL_CREAM | CUTANEOUS | 2 refills | Status: DC

## 2019-06-23 NOTE — Telephone Encounter (Signed)
Medication refill request: Zovirax Last AEX: 04/23/19 Next AEX: nothing scheduled at this time  Last MMG (if hormonal medication request): 09/04/17  Bi-rads 1 neg  Refill authorized: 15g with 2 rf pended for today. Please advise

## 2019-06-23 NOTE — Telephone Encounter (Signed)
Patient sent the following correspondence through [facility].  Hi Dr. I need my Acyclovir ointment refilled, please.. Thank you

## 2019-07-24 ENCOUNTER — Other Ambulatory Visit: Payer: Self-pay | Admitting: Internal Medicine

## 2019-07-24 DIAGNOSIS — R42 Dizziness and giddiness: Secondary | ICD-10-CM

## 2019-08-12 ENCOUNTER — Encounter: Payer: Self-pay | Admitting: Internal Medicine

## 2019-08-17 ENCOUNTER — Encounter: Payer: Self-pay | Admitting: Internal Medicine

## 2019-08-18 ENCOUNTER — Ambulatory Visit
Admission: RE | Admit: 2019-08-18 | Discharge: 2019-08-18 | Disposition: A | Discharge status: Home / Self Care | Payer: BC Managed Care – PPO | Source: Ambulatory Visit | Attending: Obstetrics & Gynecology | Admitting: Obstetrics & Gynecology

## 2019-08-18 ENCOUNTER — Other Ambulatory Visit: Payer: Self-pay

## 2019-08-18 DIAGNOSIS — Z1231 Encounter for screening mammogram for malignant neoplasm of breast: Secondary | ICD-10-CM

## 2019-08-18 DIAGNOSIS — E2839 Other primary ovarian failure: Secondary | ICD-10-CM

## 2019-08-18 DIAGNOSIS — R29890 Loss of height: Secondary | ICD-10-CM

## 2019-09-30 ENCOUNTER — Encounter: Payer: Self-pay | Admitting: Internal Medicine

## 2019-10-04 NOTE — Telephone Encounter (Signed)
She would prefer to see a female provider. Are there any other options for her outside of [facility]?

## 2019-10-26 ENCOUNTER — Other Ambulatory Visit: Payer: Self-pay | Admitting: Internal Medicine

## 2019-10-26 DIAGNOSIS — R42 Dizziness and giddiness: Secondary | ICD-10-CM

## 2019-11-13 ENCOUNTER — Encounter: Payer: Self-pay | Admitting: Internal Medicine

## 2019-11-28 ENCOUNTER — Encounter: Payer: Self-pay | Admitting: Internal Medicine

## 2019-11-29 ENCOUNTER — Ambulatory Visit (INDEPENDENT_AMBULATORY_CARE_PROVIDER_SITE_OTHER): Payer: BC Managed Care – PPO | Admitting: Internal Medicine

## 2019-11-29 ENCOUNTER — Encounter: Payer: Self-pay | Admitting: Internal Medicine

## 2019-11-29 DIAGNOSIS — J0110 Acute frontal sinusitis, unspecified: Secondary | ICD-10-CM

## 2019-11-29 MED ORDER — AMOXICILLIN-POT CLAVULANATE 875-125 MG PO TABS: 1.0000 | ORAL_TABLET | Freq: Two times a day (BID) | ORAL | 0 refills | Status: DC

## 2019-11-29 NOTE — Progress Notes (Signed)
Virtual Visit via Video Note  I connected with Stephanie Espinoza on 11/29/19 at  3:40 PM EST by a video enabled telemedicine application and verified that I am speaking with the correct person using two identifiers.  The patient and the provider were at separate locations throughout the entire encounter.   I discussed the limitations of evaluation and management by telemedicine and the availability of in person appointments. The patient expressed understanding and agreed to proceed. The patient and the provider were the only parties present for the visit unless noted in HPI below.  History of Present Illness: The patient is a 64 y.o. female with visit for sinus problems. Has a long history of sinus issues. Denies fevers or chills. Has sinus pressure and pain. Denies SOB. Denies nausea or vomiting. Started about 1 week or so ago. Denies contact with known covid-19 positive. Has been taking her allergy medication and flonase which is helping some. Overall it is stable but not improving.  Observations/Objective: Appearance: normal, breathing appears normal, casual grooming, abdomen does not appear distended, throat drainage, memory normal, mental status is A and O times 3  Assessment and Plan: See problem oriented charting  Follow Up Instructions: rx augmentin, covid testing if no improvement in 1-2 days  I discussed the assessment and treatment plan with the patient. The patient was provided an opportunity to ask questions and all were answered. The patient agreed with the plan and demonstrated an understanding of the instructions.   The patient was advised to call back or seek an in-person evaluation if the symptoms worsen or if the condition fails to improve as anticipated.  Hoyt Koch, MD

## 2019-11-30 NOTE — Assessment & Plan Note (Signed)
Rx augmentin. If no improvement in 1-2 days needs covid-19 testing.

## 2019-12-12 ENCOUNTER — Encounter: Payer: Self-pay | Admitting: Internal Medicine

## 2019-12-14 ENCOUNTER — Ambulatory Visit: Payer: BC Managed Care – PPO | Attending: Internal Medicine

## 2019-12-14 ENCOUNTER — Encounter: Payer: Self-pay | Admitting: Internal Medicine

## 2019-12-14 ENCOUNTER — Other Ambulatory Visit: Payer: BC Managed Care – PPO

## 2019-12-14 DIAGNOSIS — Z20822 Contact with and (suspected) exposure to covid-19: Secondary | ICD-10-CM

## 2019-12-15 ENCOUNTER — Encounter: Payer: Self-pay | Admitting: Internal Medicine

## 2019-12-15 ENCOUNTER — Ambulatory Visit (INDEPENDENT_AMBULATORY_CARE_PROVIDER_SITE_OTHER): Payer: BC Managed Care – PPO | Admitting: Internal Medicine

## 2019-12-15 DIAGNOSIS — G4452 New daily persistent headache (NDPH): Secondary | ICD-10-CM

## 2019-12-15 DIAGNOSIS — J0111 Acute recurrent frontal sinusitis: Secondary | ICD-10-CM

## 2019-12-15 DIAGNOSIS — B20 Human immunodeficiency virus [HIV] disease: Secondary | ICD-10-CM | POA: Diagnosis not present

## 2019-12-15 LAB — NOVEL CORONAVIRUS, NAA: SARS-CoV-2, NAA: NOT DETECTED

## 2019-12-15 MED ORDER — AMOXICILLIN-POT CLAVULANATE 875-125 MG PO TABS: 1.0000 | ORAL_TABLET | Freq: Two times a day (BID) | ORAL | 0 refills | Status: DC

## 2019-12-15 NOTE — Progress Notes (Signed)
Virtual Visit via Video Note  I connected with Stephanie Espinoza on 12/15/19 at  8:40 AM EST by a video enabled telemedicine application and verified that I am speaking with the correct person using two identifiers.  The patient and the provider were at separate locations throughout the entire encounter.   I discussed the limitations of evaluation and management by telemedicine and the availability of in person appointments. The patient expressed understanding and agreed to proceed. The patient and the provider were the only parties present for the visit unless noted in HPI below.  History of Present Illness: The patient is a 64 y.o. female with visit for ongoing sinus problems and headaches. She is still running low grade fevers and sinus congestion. Still having severe headaches almost daily. She did take 10 day course augmentin and felt much better while taking antibiotics. She does have HIV not on antiviral therapy. Started end of January initially and felt like her normal sinus infection. She took augmentin course 10 days and felt better but did not knock it out. Has some low grade fevers still to 100F and chills and body aches. Denies cough or SOB. Overall it is not improving. Has tried augmentin, allergy medication.   Observations/Objective: Appearance: normal, breathing appears normal, no coughing or dyspnea during visit, casual grooming, abdomen does not appear distended, throat with drainage, pressure to frontal sinuses, memory normal, mental status is A and O times 3  Assessment and Plan: See problem oriented charting  Follow Up Instructions: rx augmentin 2 week course, await covid-19 results, if positive rx prednisone  I discussed the assessment and treatment plan with the patient. The patient was provided an opportunity to ask questions and all were answered. The patient agreed with the plan and demonstrated an understanding of the instructions.   The patient was advised to call back or  seek an in-person evaluation if the symptoms worsen or if the condition fails to improve as anticipated.  Hoyt Koch, MD

## 2019-12-15 NOTE — Assessment & Plan Note (Signed)
With current infection, given covid-19 she is not up to date on CD4 counts and viral loads. Not on current HAART.

## 2019-12-15 NOTE — Assessment & Plan Note (Signed)
Awaiting covid-19 test, refill augmentin to see if we can clear this infection.

## 2019-12-15 NOTE — Assessment & Plan Note (Signed)
Likely related to sinuses and awaiting covid-19 test results.

## 2019-12-27 ENCOUNTER — Encounter: Payer: Self-pay | Admitting: Internal Medicine

## 2019-12-29 ENCOUNTER — Other Ambulatory Visit: Payer: Self-pay

## 2019-12-29 ENCOUNTER — Telehealth: Payer: Self-pay | Admitting: Internal Medicine

## 2019-12-29 ENCOUNTER — Encounter: Payer: Self-pay | Admitting: Internal Medicine

## 2019-12-29 ENCOUNTER — Ambulatory Visit: Payer: BC Managed Care – PPO | Admitting: Internal Medicine

## 2019-12-29 ENCOUNTER — Other Ambulatory Visit: Payer: Self-pay | Admitting: Obstetrics & Gynecology

## 2019-12-29 VITALS — BP 136/86 | HR 73 | Temp 98.5°F | Ht 61.0 in | Wt 168.0 lb

## 2019-12-29 DIAGNOSIS — B20 Human immunodeficiency virus [HIV] disease: Secondary | ICD-10-CM | POA: Diagnosis not present

## 2019-12-29 DIAGNOSIS — B029 Zoster without complications: Secondary | ICD-10-CM

## 2019-12-29 LAB — CBC
HCT: 34.6 % — ABNORMAL LOW (ref 36.0–46.0)
Hemoglobin: 11.4 g/dL — ABNORMAL LOW (ref 12.0–15.0)
MCHC: 33 g/dL (ref 30.0–36.0)
MCV: 84.2 fl (ref 78.0–100.0)
Platelets: 194 10*3/uL (ref 150.0–400.0)
RBC: 4.11 Mil/uL (ref 3.87–5.11)
RDW: 13.7 % (ref 11.5–15.5)
WBC: 3.8 10*3/uL — ABNORMAL LOW (ref 4.0–10.5)

## 2019-12-29 LAB — COMPREHENSIVE METABOLIC PANEL
ALT: 17 U/L (ref 0–35)
AST: 21 U/L (ref 0–37)
Albumin: 3.5 g/dL (ref 3.5–5.2)
Alkaline Phosphatase: 81 U/L (ref 39–117)
BUN: 11 mg/dL (ref 6–23)
CO2: 27 mEq/L (ref 19–32)
Calcium: 9.7 mg/dL (ref 8.4–10.5)
Chloride: 106 mEq/L (ref 96–112)
Creatinine, Ser: 1.07 mg/dL (ref 0.40–1.20)
GFR: 52.08 mL/min — ABNORMAL LOW (ref >60.00)
Glucose, Bld: 100 mg/dL — ABNORMAL HIGH (ref 70–99)
Potassium: 3.5 mEq/L (ref 3.5–5.1)
Sodium: 141 mEq/L (ref 135–145)
Total Bilirubin: 0.8 mg/dL (ref 0.2–1.2)
Total Protein: 8.4 g/dL — ABNORMAL HIGH (ref 6.0–8.3)

## 2019-12-29 LAB — LIPID PANEL
Cholesterol: 160 mg/dL (ref 0–200)
HDL: 34.4 mg/dL — ABNORMAL LOW (ref >39.00)
LDL Cholesterol: 98 mg/dL (ref 0–99)
NonHDL: 125.33
Total CHOL/HDL Ratio: 5
Triglycerides: 135 mg/dL (ref 0.0–149.0)
VLDL: 27 mg/dL (ref 0.0–40.0)

## 2019-12-29 MED ORDER — LIDOCAINE 5 % EX PTCH: 1.0000 | MEDICATED_PATCH | CUTANEOUS | 0 refills | Status: DC

## 2019-12-29 MED ORDER — PREGABALIN 75 MG PO CAPS: 75.0000 mg | ORAL_CAPSULE | Freq: Two times a day (BID) | ORAL | 1 refills | Status: DC

## 2019-12-29 NOTE — Telephone Encounter (Signed)
PA was initiated 54min ago. Waiting on determination from insurance.

## 2019-12-29 NOTE — Telephone Encounter (Addendum)
° °  Patient currently at the pharmacy waiting on medication. Pharmacy advised patient authorization needed for lidocaine (LIDODERM) 5 % Patient advised to allow more time, request has been faxed to insurance

## 2019-12-29 NOTE — Patient Instructions (Addendum)
We will do the blood work today.   Take valtrex 2 pills (1000 mg) 3 times a day for 10 days.   We have sent in lidocaine patches to use for the pain.  We also sent in lyrica to take 1 pill twice a day that should help with pain.

## 2019-12-29 NOTE — Progress Notes (Signed)
   Subjective:   Patient ID: Stephanie Espinoza, female    DOB: 1956/03/27, 64 y.o.   MRN: [id number]  HPI The patient is a 64 YO female coming in for painful rash she is concerned about shingles. She does have HIV and not treated with HAART at this time. No recent labs for that given pandemic. She has been isolating at home as much as possible. Denies fevers or chills. Does have valtrex at home for herpes and has not taken that as she did not know dosing. Rash is on the buttocks and central lower back.    Review of Systems  Constitutional: Negative.   HENT: Negative.   Eyes: Negative.   Respiratory: Negative for cough, chest tightness and shortness of breath.   Cardiovascular: Negative for chest pain, palpitations and leg swelling.  Gastrointestinal: Negative for abdominal distention, abdominal pain, constipation, diarrhea, nausea and vomiting.  Musculoskeletal: Negative.   Skin: Positive for wound.  Neurological: Negative.   Psychiatric/Behavioral: Negative.     Objective:  Physical Exam Constitutional:      Appearance: She is well-developed.  HENT:     Head: Normocephalic and atraumatic.  Cardiovascular:     Rate and Rhythm: Normal rate and regular rhythm.  Pulmonary:     Effort: Pulmonary effort is normal. No respiratory distress.     Breath sounds: Normal breath sounds. No wheezing or rales.  Abdominal:     General: Bowel sounds are normal. There is no distension.     Palpations: Abdomen is soft.     Tenderness: There is no abdominal tenderness. There is no rebound.  Musculoskeletal:     Cervical back: Normal range of motion.  Skin:    General: Skin is warm and dry.     Findings: Rash present.     Comments: Rash consistent with shingles on the upper buttocks and central lower back  Neurological:     Mental Status: She is alert and oriented to person, place, and time.     Coordination: Coordination normal.     Vitals:   12/29/19 0755 12/29/19 0820  BP: (!) 146/98 136/86    Pulse: 73   Temp: 98.5 F (36.9 C)   TempSrc: Oral   SpO2: 96%   Weight: 168 lb (76.2 kg)   Height: 5\' 1"  (1.549 m)     This visit occurred during the SARS-CoV-2 public health emergency.  Safety protocols were in place, including screening questions prior to the visit, additional usage of staff PPE, and extensive cleaning of exam room while observing appropriate contact time as indicated for disinfecting solutions.   Assessment & Plan:

## 2019-12-29 NOTE — Telephone Encounter (Signed)
Medication refill request: acyclovir ointment  Last AEX:  04-23-2019 SM  Next AEX: not currently scheduled Last MMG (if hormonal medication request): n/a Refill authorized: Today, please advise.   Medication pended for #30g, 1RF. Please refill if appropriate.

## 2019-12-31 DIAGNOSIS — B029 Zoster without complications: Secondary | ICD-10-CM | POA: Insufficient documentation

## 2019-12-31 NOTE — Assessment & Plan Note (Addendum)
Rx valtrex 1000 mg TID for 10 days. Rx lyrica for pain and lidoderm patches.

## 2019-12-31 NOTE — Assessment & Plan Note (Signed)
Needs HIV quant and CD4 count. Not on HAART.

## 2020-01-03 LAB — LYMPH ENUMERATION,HELPER/SUPPRESSOR
%CD8 (Cytotoxic/Suppressor): 85 % — ABNORMAL HIGH (ref 12–42)
Absolute CD4: 207 cells/uL — ABNORMAL LOW (ref 490–1740)
CD4 T Helper %: 10 % — ABNORMAL LOW (ref 30–61)
CD4/CD8 Ratio: 0.11 — ABNORMAL LOW (ref 0.86–5.00)
CD8 T Cell Abs: 1821 cells/uL — ABNORMAL HIGH (ref 180–1170)
Total lymphocyte count: 2136 cells/uL (ref 850–3900)

## 2020-01-03 LAB — HIV-1 RNA QUANT-NO REFLEX-BLD
HIV 1 RNA Quant: 4320 copies/mL — ABNORMAL HIGH
HIV-1 RNA Quant, Log: 3.64 Log copies/mL — ABNORMAL HIGH

## 2020-01-03 LAB — RPR: RPR Ser Ql: NONREACTIVE

## 2020-01-04 ENCOUNTER — Encounter: Payer: Self-pay | Admitting: Internal Medicine

## 2020-01-07 ENCOUNTER — Other Ambulatory Visit: Payer: Self-pay | Admitting: Internal Medicine

## 2020-01-07 MED ORDER — AMITRIPTYLINE HCL 50 MG PO TABS: 50.0000 mg | ORAL_TABLET | Freq: Every day | ORAL | 3 refills | Status: DC

## 2020-01-12 NOTE — Telephone Encounter (Signed)
Rx completed.  Pt does need AEX for h/o +HR HPV 04/2020.  Please schedule.  Thanks.

## 2020-01-13 NOTE — Telephone Encounter (Signed)
Call placed to pt. Pt scheduled AEX on 04/11/2020 at 1:30 pm. Pt agreeable and verbalized understanding.   Routing to Dr Sabra [place] and will close encounter.

## 2020-01-25 ENCOUNTER — Other Ambulatory Visit: Payer: BC Managed Care – PPO

## 2020-01-25 ENCOUNTER — Other Ambulatory Visit: Payer: Self-pay | Admitting: *Deleted

## 2020-01-25 ENCOUNTER — Encounter: Payer: Self-pay | Admitting: Certified Nurse Midwife

## 2020-01-25 ENCOUNTER — Other Ambulatory Visit: Payer: Self-pay

## 2020-01-25 DIAGNOSIS — B20 Human immunodeficiency virus [HIV] disease: Secondary | ICD-10-CM

## 2020-01-26 ENCOUNTER — Encounter: Payer: Self-pay | Admitting: Internal Medicine

## 2020-01-26 LAB — T-HELPER CELL (CD4) - (RCID CLINIC ONLY)
CD4 % Helper T Cell: 8 % — ABNORMAL LOW (ref 33–65)
CD4 T Cell Abs: 148 /uL — ABNORMAL LOW (ref 400–1790)

## 2020-01-27 ENCOUNTER — Other Ambulatory Visit: Payer: Self-pay | Admitting: Internal Medicine

## 2020-01-27 DIAGNOSIS — B20 Human immunodeficiency virus [HIV] disease: Secondary | ICD-10-CM

## 2020-01-27 LAB — HIV-1 RNA QUANT-NO REFLEX-BLD
HIV 1 RNA Quant: 2490 copies/mL — ABNORMAL HIGH
HIV-1 RNA Quant, Log: 3.4 Log copies/mL — ABNORMAL HIGH

## 2020-01-27 MED ORDER — BICTEGRAVIR-EMTRICITAB-TENOFOV 50-200-25 MG PO TABS: 1.0000 | ORAL_TABLET | Freq: Every day | ORAL | 11 refills | Status: DC

## 2020-01-27 MED ORDER — SULFAMETHOXAZOLE-TRIMETHOPRIM 800-160 MG PO TABS: 1.0000 | ORAL_TABLET | Freq: Every day | ORAL | 11 refills | Status: DC

## 2020-02-01 ENCOUNTER — Telehealth: Payer: Self-pay

## 2020-02-01 ENCOUNTER — Encounter: Payer: Self-pay | Admitting: Internal Medicine

## 2020-02-01 NOTE — Telephone Encounter (Signed)
For what?

## 2020-02-01 NOTE — Telephone Encounter (Signed)
Marquis Lunch to Hoyt Koch, MD     02/01/20 9:53 AM I need to see someone about my frequent urinating , I have time get up during the night to go, I can't hold the urge to go , I'm now wearing pads because of this.. Next my left leg has pain going up to my back causing me not to want to stand for long or take long walks, pain comes and goes. Headache are coming more frequently, and I'm not able to concentrate when this happens.. I can't focus.. These are some of the issues I am dealing with. So where do we go from here?? What is next?

## 2020-02-02 NOTE — Telephone Encounter (Signed)
Spoke with patient. She just received her antiretroviral therapy today, is scheduled to receive the [place] vaccine 4/1 at 1:00.  She would like to hold off starting her HIV medication until Monday, so that it will be clear if she is having side effects from the medication or from the vaccine. Landis Gandy, RN

## 2020-02-03 LAB — NOVEL CORONAVIRUS, NAA

## 2020-02-04 ENCOUNTER — Other Ambulatory Visit: Payer: Self-pay | Admitting: Internal Medicine

## 2020-02-08 ENCOUNTER — Ambulatory Visit: Payer: BC Managed Care – PPO | Admitting: Family

## 2020-02-14 ENCOUNTER — Encounter: Payer: Self-pay | Admitting: Internal Medicine

## 2020-02-14 ENCOUNTER — Ambulatory Visit: Payer: BC Managed Care – PPO | Admitting: Internal Medicine

## 2020-02-14 ENCOUNTER — Telehealth: Payer: Self-pay | Admitting: Internal Medicine

## 2020-02-14 ENCOUNTER — Other Ambulatory Visit: Payer: Self-pay

## 2020-02-14 VITALS — BP 158/98 | HR 80 | Temp 98.2°F | Ht 61.0 in | Wt 169.4 lb

## 2020-02-14 DIAGNOSIS — R413 Other amnesia: Secondary | ICD-10-CM | POA: Diagnosis not present

## 2020-02-14 DIAGNOSIS — R350 Frequency of micturition: Secondary | ICD-10-CM

## 2020-02-14 DIAGNOSIS — G4700 Insomnia, unspecified: Secondary | ICD-10-CM | POA: Diagnosis not present

## 2020-02-14 DIAGNOSIS — G4452 New daily persistent headache (NDPH): Secondary | ICD-10-CM

## 2020-02-14 DIAGNOSIS — B20 Human immunodeficiency virus [HIV] disease: Secondary | ICD-10-CM

## 2020-02-14 LAB — POCT URINALYSIS DIPSTICK OB
Bilirubin, UA: NEGATIVE
Blood, UA: NEGATIVE
Glucose, UA: NEGATIVE
Ketones, UA: NEGATIVE
Leukocytes, UA: NEGATIVE
Nitrite, UA: NEGATIVE
Spec Grav, UA: 1.02 (ref 1.010–1.025)
Urobilinogen, UA: 0.2 E.U./dL
pH, UA: 6 (ref 5.0–8.0)

## 2020-02-14 LAB — VITAMIN D 25 HYDROXY (VIT D DEFICIENCY, FRACTURES): VITD: 13.52 ng/mL — ABNORMAL LOW (ref 30.00–100.00)

## 2020-02-14 LAB — VITAMIN B12: Vitamin B-12: 244 pg/mL (ref 211–911)

## 2020-02-14 LAB — TSH: TSH: 1.76 u[IU]/mL (ref 0.35–4.50)

## 2020-02-14 MED ORDER — TRAZODONE HCL 100 MG PO TABS: 100.0000 mg | ORAL_TABLET | Freq: Every day | ORAL | 1 refills | Status: DC

## 2020-02-14 NOTE — Telephone Encounter (Signed)
I received FMLA via fax from [facility]. Dr.Crawford has approved 8 days a month. Forms have been completed &Placed in providers box to review and sign.

## 2020-02-14 NOTE — Assessment & Plan Note (Addendum)
Unclear history and could be related to recent sleep deprivation. I would like her to monitor closely and if not improving with improved sleep talk more to her ID specialist next week as further assessment may be needed given her low CD4 count and HIV. Ordered MRI which may be able to show if IRIS is a possibility.

## 2020-02-14 NOTE — Progress Notes (Signed)
Subjective:   Patient ID: Stephanie Espinoza, female    DOB: 1955-11-15, 64 y.o.   MRN: [id number]  HPI The patient is a 64 YO female coming in for several concerns including possible UTI (more frequency with going 2-3 times at night, denies burning, denies nausea or vomiting, taking bactrim daily for preventative given CD4 count) and memory changes (in the last month or so she has noticed large changes in memory, she is a Freight forwarder and very detail oriented, now is needing to make lists and actually check things off to remember she has done them, cannot remember pills without picking a specific time and place to take meds, sometimes having to count pills to see if she has taken them, losing keys and phone often, this started before starting biktarvy, previously has had cognitive changes with hiv therapy which is why she has not taken it over the years, feels at times this limits her doing her job, did start around the same time anxiety/sleep problems started, has not gotten lost but had to think very hard about how to get to our office today, concerned about missing bills as she pays bills for her department at work) and sleeping problems (started back 3 weeks or so ago, is anxious with awakening, denies SI/HI, is not sleeping during same period and taking a long time to go to sleep, wakes feeling not rested at all, denies change in medication with that change, is taking bactrim for about the same time frame, now taking biktarvy since early April, is concerned given her concurrent HIV). She is also having headache the last 7-10 days (some chronic vision changes not dramatically different lately, does have HIV with most recent CD4 count 148, has cats, headache is mostly frontal, no fevers or chills).   Needs FMLA intermittent 8 days/month: 6 months for now  Review of Systems  Constitutional: Positive for fatigue.  HENT: Negative.   Eyes: Positive for visual disturbance.       Blurring vision at times    Respiratory: Negative for cough, chest tightness and shortness of breath.   Cardiovascular: Negative for chest pain, palpitations and leg swelling.  Gastrointestinal: Negative for abdominal distention, abdominal pain, constipation, diarrhea, nausea and vomiting.  Musculoskeletal: Negative.   Skin: Negative.   Neurological: Positive for headaches. Negative for dizziness, seizures, syncope, speech difficulty, weakness, light-headedness and numbness.       Memory change  Psychiatric/Behavioral: Positive for decreased concentration, dysphoric mood and sleep disturbance. Negative for self-injury and suicidal ideas. The patient is nervous/anxious.     Objective:  Physical Exam Constitutional:      Appearance: She is well-developed.  HENT:     Head: Normocephalic and atraumatic.  Eyes:     Extraocular Movements: Extraocular movements intact.     Pupils: Pupils are equal, round, and reactive to light.  Cardiovascular:     Rate and Rhythm: Normal rate and regular rhythm.  Pulmonary:     Effort: Pulmonary effort is normal. No respiratory distress.     Breath sounds: Normal breath sounds. No wheezing or rales.  Abdominal:     General: Bowel sounds are normal. There is no distension.     Palpations: Abdomen is soft.     Tenderness: There is no abdominal tenderness. There is no rebound.  Musculoskeletal:     Cervical back: Normal range of motion.  Skin:    General: Skin is warm and dry.  Neurological:     Mental Status: She is alert and  oriented to person, place, and time.     GCS: GCS eye subscore is 4. GCS verbal subscore is 5. GCS motor subscore is 6.     Coordination: Coordination normal.  Psychiatric:     Comments: Appropriate mood     Vitals:   02/14/20 0939  BP: (!) 158/98  Pulse: 80  Temp: 98.2 F (36.8 C)  SpO2: 99%  Weight: 169 lb 6.4 oz (76.8 kg)  Height: 5\' 1"  (1.549 m)    This visit occurred during the SARS-CoV-2 public health emergency.  Safety protocols were in  place, including screening questions prior to the visit, additional usage of staff PPE, and extensive cleaning of exam room while observing appropriate contact time as indicated for disinfecting solutions.   Assessment & Plan:  Visit time 25 minutes in face to face communication with patient and coordination of care, additional 15 minutes spent in record review, coordination or care, ordering tests, communicating/referring to other healthcare professionals, documenting in medical records all on the same day of the visit for total time 40 minutes spent on the visit.

## 2020-02-14 NOTE — Assessment & Plan Note (Signed)
U/A done in office without signs of infection. Is on bactrim daily for prevention at this time.

## 2020-02-14 NOTE — Patient Instructions (Addendum)
We will check labs and MRI of the brain.   Let us know if you need the FMLA and we can do that.   We have sent in trazodone for sleep to try.  Fax [phone]

## 2020-02-14 NOTE — Assessment & Plan Note (Signed)
Taking bactrim daily and biktarvy currently. Less than 2 weeks on biktarvy so far. Headache and memory changes could be related to her HIV and we discussed this. Depending on how she is doing will ask her ID specialist (seeing next week) to assess and recommend if further diagnostic considerations are needed.

## 2020-02-14 NOTE — Assessment & Plan Note (Signed)
Unclear if anxiety and insomnia are related. We talked about side effects of sleep deprivation which she is likely experiencing some of these. Rx trazodone for sleep today and see how she tolerates.

## 2020-02-14 NOTE — Assessment & Plan Note (Signed)
Checking labs and MRI brain today. We talked about how this could be related to her sleep and anxiety problems, HIV, or another cause altogether. Depending on results we will move forward with assessment.

## 2020-02-15 ENCOUNTER — Other Ambulatory Visit: Payer: Self-pay | Admitting: Internal Medicine

## 2020-02-15 DIAGNOSIS — Z0279 Encounter for issue of other medical certificate: Secondary | ICD-10-CM

## 2020-02-15 MED ORDER — VITAMIN D (ERGOCALCIFEROL) 1.25 MG (50000 UNIT) PO CAPS: 50000.0000 [IU] | ORAL_CAPSULE | ORAL | 0 refills | Status: DC

## 2020-02-15 NOTE — Telephone Encounter (Signed)
Forms have been signed, Faxed To [facility] @[phone], Copy sent to scan, &Charged for.   Patient informed. Original mailed to patient.

## 2020-02-22 ENCOUNTER — Other Ambulatory Visit: Payer: Self-pay

## 2020-02-22 ENCOUNTER — Encounter: Payer: Self-pay | Admitting: Internal Medicine

## 2020-02-22 ENCOUNTER — Ambulatory Visit: Payer: BC Managed Care – PPO | Admitting: Internal Medicine

## 2020-02-22 DIAGNOSIS — B20 Human immunodeficiency virus [HIV] disease: Secondary | ICD-10-CM | POA: Diagnosis not present

## 2020-02-22 NOTE — Assessment & Plan Note (Signed)
Stephanie Espinoza has never stayed on antiretroviral therapy very long.  In the past she has always avoided taking it or staying on it because of concerns of side effects.  Fortunately she seems to be tolerating Biktarvy well and is willing to take it.  I have given her a pillbox and asked her to set her phone alarm to remind her to take her medications.  I suspect that her memory loss is multifactorial.  I doubt that she has advanced HIV dementia.  She has had several medical problems over the past year.  I think these are contributing factors along with the Covid pandemic, worsened anxiety and insomnia.  She will get repeat blood work today and follow-up in 4 weeks.

## 2020-02-22 NOTE — Progress Notes (Signed)
Patient Active Problem List   Diagnosis Date Noted  . Human immunodeficiency virus (HIV) disease ([place]) 09/06/2006    Priority: High  . Acute on chronic cholecystitis 03/01/2019    Priority: Medium  . Choledocholithiasis 03/01/2019    Priority: Medium  . GENITAL HERPES 09/06/2006    Priority: Medium  . Memory loss 02/14/2020  . Insomnia 02/14/2020  . Herpes zoster 12/31/2019  . Status post laparoscopic cholecystectomy 03/03/2019  . Common bile duct stones 03/01/2019  . Normocytic anemia 03/01/2019  . Hyperglycemia 03/01/2019  . Obesity (BMI 30.0-34.9) 03/01/2019  . Elevated liver enzymes 02/28/2019  . Headache 06/30/2018  . Urinary frequency 05/01/2018  . Salivary gland adenitis 02/18/2018  . Sciatica 05/26/2017  . 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 Low back pain 06/12/2016  . Essential hypertension 09/16/2014  . 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 Sinusitis 03/22/2009  . SYPHILIS NOS 09/06/2006  . Depression 09/06/2006  . PAP SMEAR, ABNORMAL 09/06/2006    Patient's Medications  New Prescriptions   No medications on file  Previous Medications   ACYCLOVIR OINTMENT (ZOVIRAX) 5 %    APPLY TOPICALLY EVERY 3 HOURS. MAY USE FOR UP TO 7 DAYS   ALBUTEROL (VENTOLIN HFA) 108 (90 BASE) MCG/ACT INHALER    Inhale 2 puffs into the lungs every 6 (six) hours as needed for wheezing or shortness of breath.   BICTEGRAVIR-EMTRICITABINE-TENOFOVIR AF (BIKTARVY) 50-200-25 MG TABS TABLET    Take 1 tablet by mouth daily.   FLUTICASONE (FLONASE) 50 MCG/ACT NASAL SPRAY    Place 2 sprays into both nostrils daily. NEED OFFICE VISIT FOR FURTHER REFILLS   SULFAMETHOXAZOLE-TRIMETHOPRIM (BACTRIM DS) 800-160 MG TABLET    Take 1 tablet by mouth daily.   TRAZODONE (DESYREL) 100 MG TABLET    Take 1 tablet (100 mg total) by mouth at bedtime.   VALACYCLOVIR (VALTREX) 500 MG TABLET    1 po q day and increase to bid x 3 days with symptoms.   VITAMIN D,  ERGOCALCIFEROL, (DRISDOL) 1.25 MG (50000 UNIT) CAPS CAPSULE    Take 1 capsule (50,000 Units total) by mouth every 7 (seven) days.  Modified Medications   No medications on file  Discontinued Medications   No medications on file    Subjective: Stephanie Espinoza is in for her routine HIV follow-up visit.  She tells me that she has been having more problems with memory and concentration over the past 6 months.  She tells a story of how she got a cup of water in the kitchen and wondered back to her bedroom without the water.  She spent the next 20 minutes looking for it before finding it on the porch where she left that while she was letting her dogs then.  She says that she often cannot recall if she took her medication.  She is not using a pillbox and has not set her phone alarm to remind her.  She became concerned when her most recent HIV blood work showed that her CD4 count had dropped to 148 and her viral load was 2490.  She agreed to restart Biktarvy and Trimethoprim/Sulfamethoxazole on 01/26/2020.  She has tolerated the medications well.  She does not think she has missed doses of those medications.  She received her [employer] Covid vaccine 3 weeks ago  Review of Systems: Review of Systems  Constitutional: Positive for malaise/fatigue. Negative for chills, diaphoresis, fever and weight loss.  HENT: Negative  for congestion and sore throat.   Respiratory: Negative for cough, sputum production and shortness of breath.   Cardiovascular: Negative for chest pain.  Gastrointestinal: Negative for abdominal pain, diarrhea, nausea and vomiting.  Genitourinary: Negative for dysuria.  Musculoskeletal: Positive for back pain.  Neurological: Positive for headaches.       She had a recent episode of vertigo that resolved spontaneously.  She has had similar episodes in the past.  Psychiatric/Behavioral: Negative for depression. The patient is nervous/anxious and has insomnia.     Past Medical History:    Diagnosis Date  . Abnormal Pap smear of cervix before 1995   always a repeat back to normal except for 1 time and had colpo bioosy -resutls were normal  . Allergic rhinitis   . Anxiety   . Bell's palsy after 1995 ?   no residual SE.  [name] Depression   . Genital herpes   . History of drug abuse ([place])    drug usser [place].  cocaine  . HIV (human immunodeficiency virus infection) ([place]) 1990   from sexual contact who is unknown  . Syphilis    treated    Social History   Tobacco Use  . Smoking status: Former Smoker    Types: Cigarettes    Quit date: 03/01/1990    Years since quitting: 30.0  . Smokeless tobacco: Never Used  Substance Use Topics  . Alcohol use: No  . Drug use: No    Family History  Problem Relation Age of Onset  . Diabetes Father   . Breast cancer Other     Allergies  Allergen Reactions  . Haldol [Haloperidol] Other (See Comments)    Wipes out her memory  . Nevirapine     REACTION: Rash 10/08  . Thorazine [Chlorpromazine] Other (See Comments)    "makes me comatose"    Health Maintenance  Topic Date Due  . COVID-19 Vaccine (1) Never done  . INFLUENZA VACCINE  06/04/2020  . Fecal DNA (Cologuard)  02/07/2021  . MAMMOGRAM  08/17/2021  . PAP SMEAR-Modifier  04/22/2022  . TETANUS/TDAP  10/10/2026  . Hepatitis C Screening  Completed  . HIV Screening  Completed    Objective:  Vitals:   02/22/20 1411  BP: 138/89  Pulse: 89  Temp: 98.1 F (36.7 C)  SpO2: 96%  Weight: 170 lb (77.1 kg)  Height: 5\' 1"  (1.549 m)   Body mass index is 32.12 kg/m.  Physical Exam Constitutional:      Comments: She is alert, talkative and in good spirits.  Cardiovascular:     Rate and Rhythm: Normal rate and regular rhythm.     Heart sounds: No murmur.  Pulmonary:     Effort: Pulmonary effort is normal.     Breath sounds: Normal breath sounds.  Abdominal:     Tenderness: There is no abdominal tenderness.  Musculoskeletal:        General: No swelling or  tenderness.  Skin:    Findings: No rash.  Neurological:     General: No focal deficit present.  Psychiatric:        Mood and Affect: Mood normal.     Lab Results Lab Results  Component Value Date   WBC 3.8 (L) 12/29/2019   HGB 11.4 (L) 12/29/2019   HCT 34.6 (L) 12/29/2019   MCV 84.2 12/29/2019   PLT 194.0 12/29/2019    Lab Results  Component Value Date   CREATININE 1.07 12/29/2019   BUN 11 12/29/2019  NA 141 12/29/2019   K 3.5 12/29/2019   CL 106 12/29/2019   CO2 27 12/29/2019    Lab Results  Component Value Date   ALT 17 12/29/2019   AST 21 12/29/2019   ALKPHOS 81 12/29/2019   BILITOT 0.8 12/29/2019    Lab Results  Component Value Date   CHOL 160 12/29/2019   HDL 34.40 (L) 12/29/2019   LDLCALC 98 12/29/2019   LDLDIRECT 102.1 03/02/2009   TRIG 135.0 12/29/2019   CHOLHDL 5 12/29/2019   Lab Results  Component Value Date   LABRPR NON-REACTIVE 12/29/2019   HIV 1 RNA Quant (copies/mL)  Date Value  01/25/2020 2,490 (H)  12/29/2019 4,320 (H)  01/07/2019 <20 NOT DETECTED   CD4 T Cell Abs (/uL)  Date Value  01/25/2020 148 (L)  01/07/2019 230 (L)  09/10/2018 390 (L)     Problem List Items Addressed This Visit      High   Human immunodeficiency virus (HIV) disease ([place])    Stephanie Espinoza has never stayed on antiretroviral therapy very long.  In the past she has always avoided taking it or staying on it because of concerns of side effects.  Fortunately she seems to be tolerating Biktarvy well and is willing to take it.  I have given her a pillbox and asked her to set her phone alarm to remind her to take her medications.  I suspect that her memory loss is multifactorial.  I doubt that she has advanced HIV dementia.  She has had several medical problems over the past year.  I think these are contributing factors along with the Covid pandemic, worsened anxiety and insomnia.  She will get repeat blood work today and follow-up in 4 weeks.      Relevant Orders   T-helper  cell (CD4)- ([facility] clinic only)   HIV-1 RNA quant-no reflex-bld        Michel Bickers, MD [facility] for [place] [phone] pager   [phone] cell 02/22/2020, 2:47 PM

## 2020-02-23 ENCOUNTER — Encounter: Payer: Self-pay | Admitting: Internal Medicine

## 2020-02-23 LAB — T-HELPER CELL (CD4) - (RCID CLINIC ONLY)
CD4 % Helper T Cell: 9 % — ABNORMAL LOW (ref 33–65)
CD4 T Cell Abs: 242 /uL — ABNORMAL LOW (ref 400–1790)

## 2020-02-24 LAB — HIV-1 RNA QUANT-NO REFLEX-BLD
HIV 1 RNA Quant: <20 copies/mL — AB
HIV-1 RNA Quant, Log: <1.3 Log copies/mL — AB

## 2020-03-06 ENCOUNTER — Encounter: Payer: Self-pay | Admitting: Internal Medicine

## 2020-03-09 ENCOUNTER — Ambulatory Visit
Admission: RE | Admit: 2020-03-09 | Discharge: 2020-03-09 | Disposition: A | Discharge status: Home / Self Care | Payer: BC Managed Care – PPO | Source: Ambulatory Visit | Attending: Internal Medicine | Admitting: Internal Medicine

## 2020-03-09 ENCOUNTER — Other Ambulatory Visit: Payer: Self-pay

## 2020-03-09 DIAGNOSIS — R413 Other amnesia: Secondary | ICD-10-CM

## 2020-03-10 ENCOUNTER — Encounter: Payer: Self-pay | Admitting: Internal Medicine

## 2020-03-10 IMAGING — MR MR 3D RECON AT SCANNER
19 of 23 series · 19 of 23 positions shown · IV contrast (gadavist)
Comparison: CT on 02/28/2019

CLINICAL DATA: Postop from laparoscopic cholecystectomy. Elevated
liver tests.

EXAM:
MRI ABDOMEN WITHOUT AND WITH CONTRAST (INCLUDING MRCP)
TECHNIQUE: Multiplanar multisequence MR imaging of the abdomen was performed
both before and after the administration of intravenous contrast.
Heavily T2-weighted images of the biliary and pancreatic ducts were
obtained, and three-dimensional MRCP images were rendered by post
processing.
CONTRAST:  8 mL Gadavist

[Series 3: T2 fat-sat · axial · 5.0mm · 0.86mm/px · 1 of 54 slices shown]
[im 1/54]
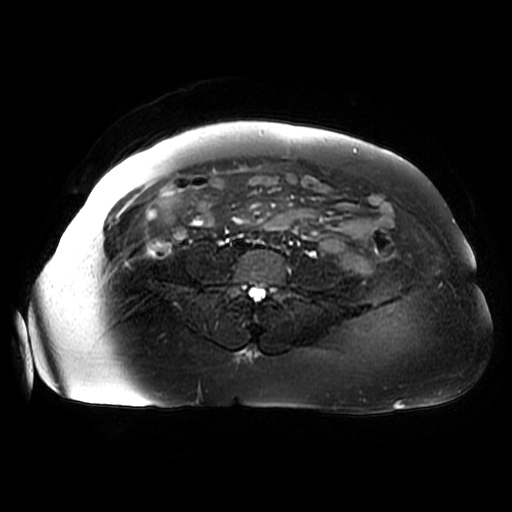

[Series 4: MRCP · coronal · 1.6mm · 0.62mm/px · 1 of 129 slices shown (1 of 2)]
[im 1/129]
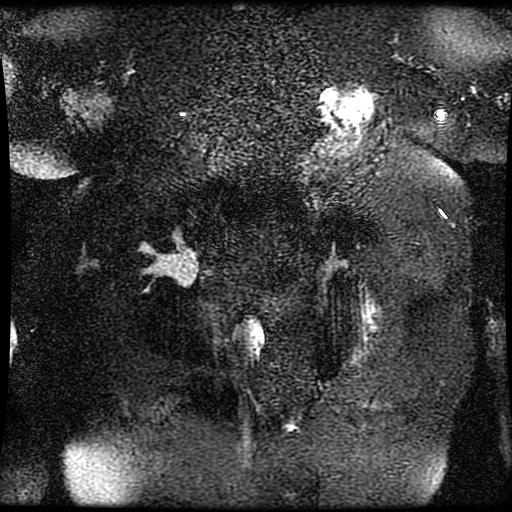

[Series 6: DWI b500 · axial · 6.0mm · 1.76mm/px · 1 of 64 slices shown]
[im 1/64]
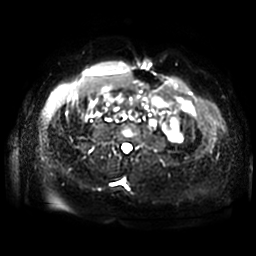

[Series 7: bSSFP fat-sat · coronal · 5.0mm · 0.78mm/px · 1 of 47 slices shown]
[im 1/47]
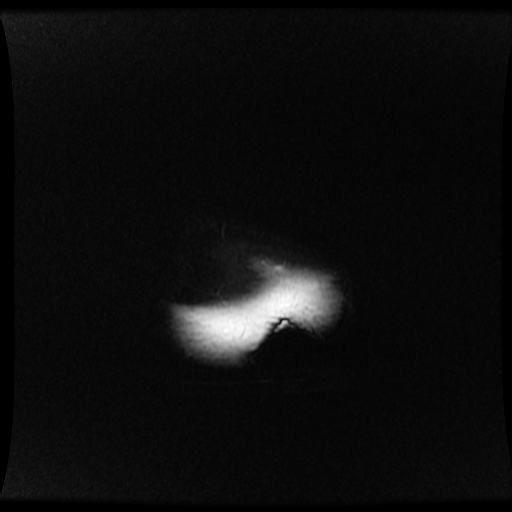

[Series 8: T2 · axial · 5.0mm · 0.86mm/px · 1 of 54 slices shown]
[im 1/54]
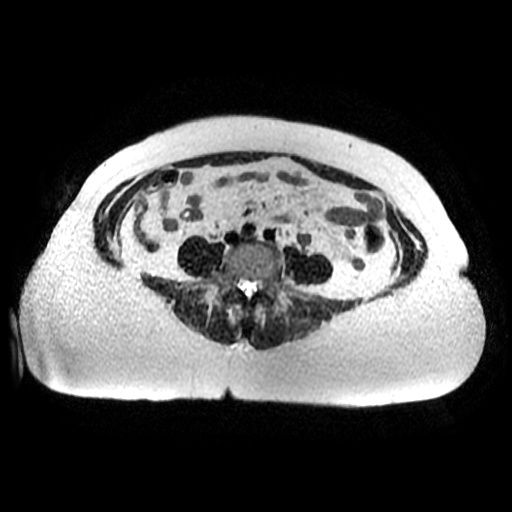

[Series 9: ax dualecho bh · axial · 5.0mm · 0.86mm/px · 1 of 108 slices shown]
[im 1/108]
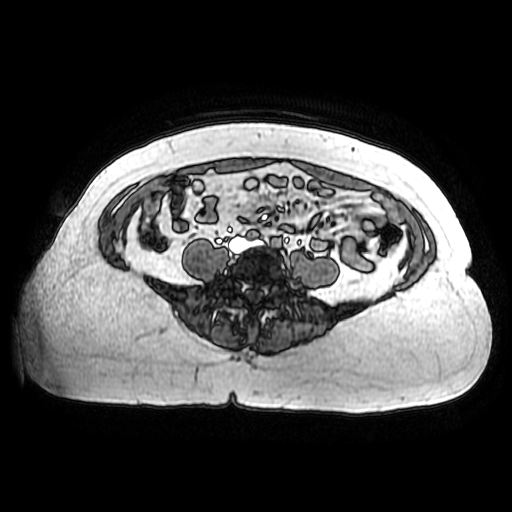

[Series 10: MRCP · coronal · 40.0mm · 0.70mm/px · 1 of 7 slices shown (2 of 2)]
[im 1/7]
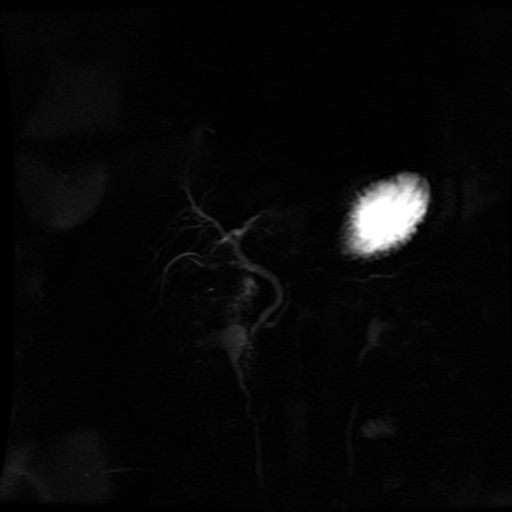

[Series 400: reformatted · axial · 1.6mm · 0.62mm/px · 1 of 101 slices shown (1 of 2)]
[im 1/101]
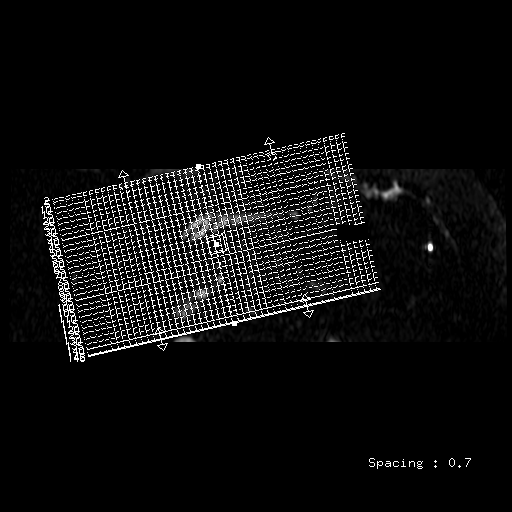

[Series 401: reformatted · sagittal · 100.0mm · 0.51mm/px · 1 of 39 slices shown (2 of 2)]
[im 1/39]
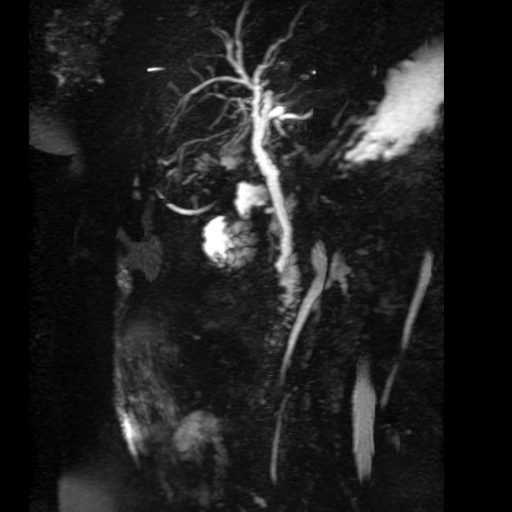

[Series 600: DWI · axial · 6.0mm · 1.76mm/px · 1 of 32 slices shown]
[im 1/32]
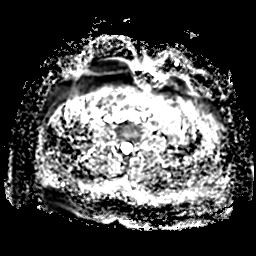

[Series 1100: T1 dynamic · axial · 6.0mm · 0.78mm/px · 1 of 88 slices shown (1 of 5)]
[im 1/88]
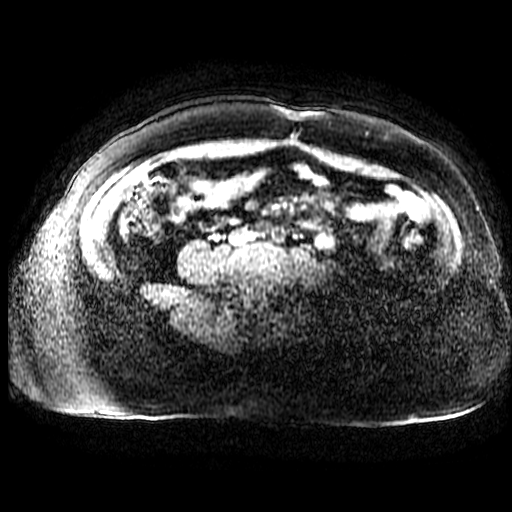

[Series 1101: T1 dynamic · axial · 6.0mm · 0.78mm/px · 1 of 88 slices shown (2 of 5)]
[im 1/88]
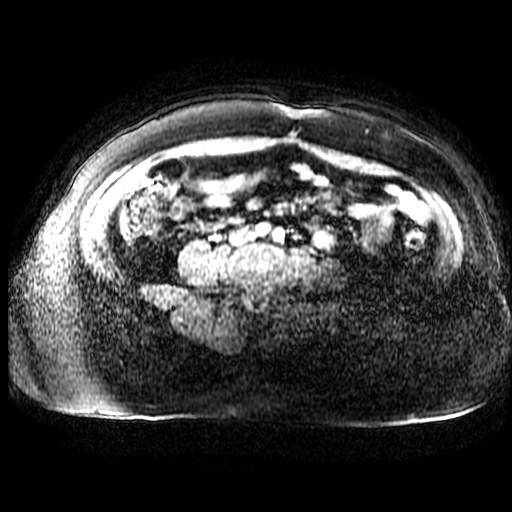

[Series 1103: T1 dynamic · axial · 6.0mm · 0.78mm/px · 1 of 88 slices shown (3 of 5)]
[im 1/88]
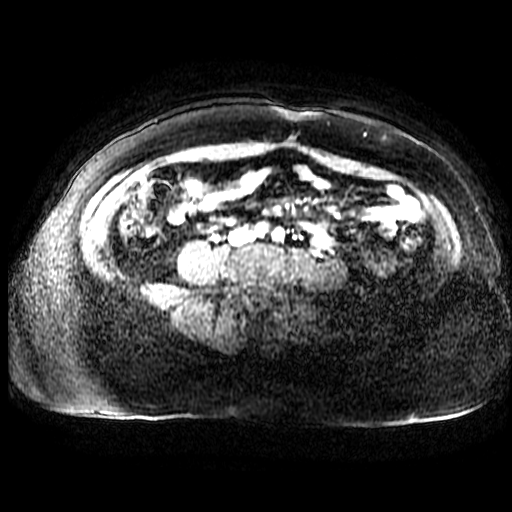

[Series 1104: T1 dynamic · axial · 6.0mm · 0.78mm/px · 1 of 88 slices shown (4 of 5)]
[im 1/88]
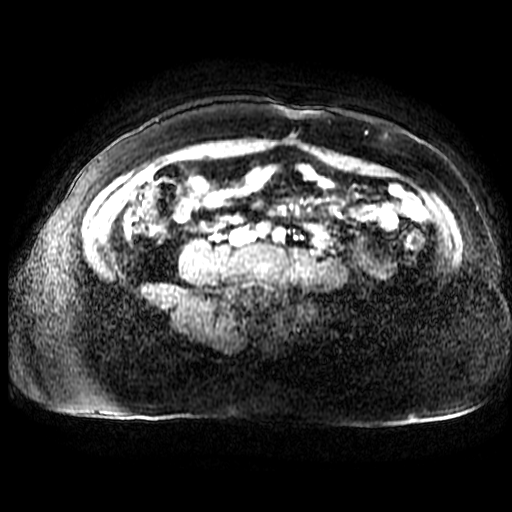

[Series 1105: T1 dynamic · axial · 6.0mm · 0.78mm/px · 1 of 88 slices shown (5 of 5)]
[im 1/88]
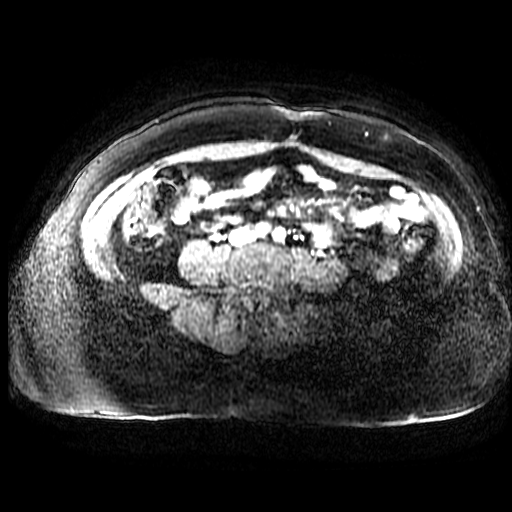

[((id)/(id)/88)-((id)/(id)/88) · axial · 6.0mm · 0.78mm/px · 1 of 88 slices shown (1 of 4)]
[im 1/88]
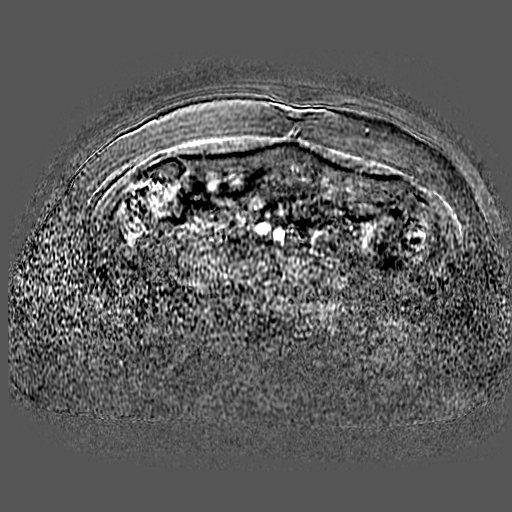

[((id)/(id)/88)-((id)/(id)/88) · axial · 6.0mm · 0.78mm/px · 1 of 88 slices shown (2 of 4)]
[im 1/88]
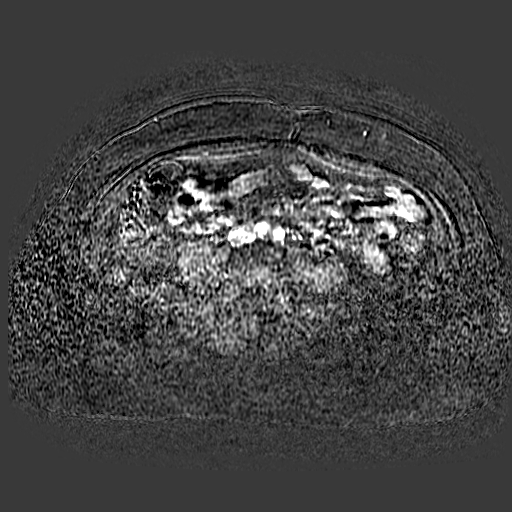

[((id)/(id)/88)-((id)/(id)/88) · axial · 6.0mm · 0.78mm/px · 1 of 88 slices shown (3 of 4)]
[im 1/88]
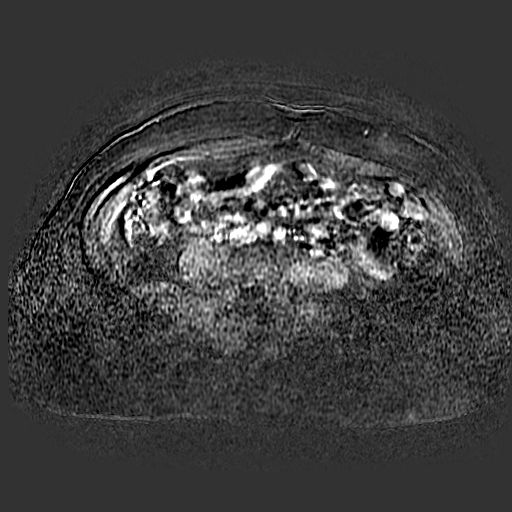

[((id)/(id)/88)-((id)/(id)/88) · axial · 6.0mm · 0.78mm/px · 1 of 88 slices shown (4 of 4)]
[im 1/88]
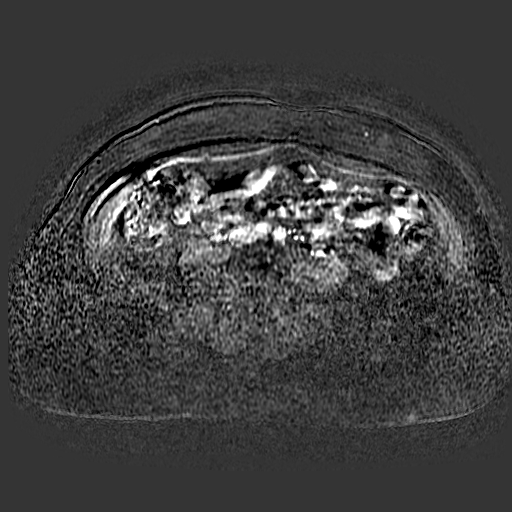

[19 of 23 positions shown; findings below may reference images not displayed]

FINDINGS: Lower chest: Tiny bilateral pleural effusions and bibasilar
atelectasis.

Hepatobiliary: No hepatic masses identified. Expected postop changes
from recent cholecystectomy. No abnormal fluid collections in
gallbladder fossa. No evidence of biliary ductal dilatation with
common bile duct measuring 5 mm. No evidence of choledocholithiasis
or biliary stricture.

Pancreas: No mass or inflammatory changes. No evidence of pancreatic
ductal dilatation or pancreas divisum.

Spleen:  Within normal limits in size and appearance.

Adrenals/Urinary Tract: No masses identified. No evidence of
hydronephrosis.

Stomach/Bowel: Visualized portion unremarkable.

Vascular/Lymphatic: No pathologically enlarged lymph nodes
identified. No abdominal aortic aneurysm.

Other:  None.

Musculoskeletal:  No suspicious bone lesions identified.
IMPRESSION: Expected postop changes from recent cholecystectomy. No evidence of
biliary ductal dilatation or choledocholithiasis.

Tiny bilateral pleural effusions and bibasilar atelectasis.

## 2020-03-15 ENCOUNTER — Other Ambulatory Visit: Payer: Self-pay

## 2020-03-15 ENCOUNTER — Ambulatory Visit: Payer: BC Managed Care – PPO | Admitting: Internal Medicine

## 2020-03-15 ENCOUNTER — Encounter: Payer: Self-pay | Admitting: Internal Medicine

## 2020-03-15 VITALS — BP 160/98 | HR 70 | Temp 98.4°F | Ht 61.0 in | Wt 173.0 lb

## 2020-03-15 DIAGNOSIS — G4700 Insomnia, unspecified: Secondary | ICD-10-CM | POA: Diagnosis not present

## 2020-03-15 DIAGNOSIS — R413 Other amnesia: Secondary | ICD-10-CM | POA: Diagnosis not present

## 2020-03-15 DIAGNOSIS — F329 Major depressive disorder, single episode, unspecified: Secondary | ICD-10-CM

## 2020-03-15 NOTE — Progress Notes (Signed)
   Subjective:   Patient ID: Stephanie Espinoza, female    DOB: 1955-11-17, 64 y.o.   MRN: [id number]  HPI The patient is a 64 YO female coming in for follow up of insomnia (using ambien now and sleeping about 4-5 hours most nights, she is waking up around 2-3 am and not tired but gets tired throughout the day, sometimes napping due to fatigue in the afternoon, ambien lets her relax to sleep but does not cause immediate sleep, overall she is feeling slightly improved with this) and anxiety/depression (lack of patience with situations which used to not ruffle her, she is sleeping some better, still with concentration changes, some decreased mood with this, a little anxiety but feeling better as her CD4 and HIV numbers are improving and she is doing okay with biktarvy) and lack of concentration (recent labs and MRI done, with concurrent HIV and MRI unable to rule out HIV changes, she is still having significant focus issues, she is sleeping a little better and mood is improved some, 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   Review of Systems  Constitutional: Positive for activity change and chills.  HENT: Negative.   Eyes: Negative.   Respiratory: Negative for cough, chest tightness and shortness of breath.   Cardiovascular: Negative for chest pain, palpitations and leg swelling.  Gastrointestinal: Negative for abdominal distention, abdominal pain, constipation, diarrhea, nausea and vomiting.  Endocrine: Positive for heat intolerance.  Musculoskeletal: Negative.   Skin: Negative.   Neurological: Negative.   Psychiatric/Behavioral: Positive for decreased concentration, dysphoric mood and sleep disturbance. The patient is nervous/anxious.     Objective:  Physical Exam Constitutional:      Appearance: She is well-developed.  HENT:     Head: Normocephalic and atraumatic.  Cardiovascular:     Rate and Rhythm: Normal rate and regular rhythm.  Pulmonary:     Effort:  Pulmonary effort is normal. No respiratory distress.     Breath sounds: Normal breath sounds. No wheezing or rales.  Abdominal:     General: Bowel sounds are normal. There is no distension.     Palpations: Abdomen is soft.     Tenderness: There is no abdominal tenderness. There is no rebound.  Musculoskeletal:     Cervical back: Normal range of motion.  Skin:    General: Skin is warm and dry.  Neurological:     Mental Status: She is alert and oriented to person, place, and time.     Coordination: Coordination normal.     Vitals:   03/15/20 0835  BP: (!) 160/98  Pulse: 70  Temp: 98.4 F (36.9 C)  SpO2: 99%  Weight: 173 lb (78.5 kg)  Height: 5\' 1"  (1.549 m)    This visit occurred during the SARS-CoV-2 public health emergency.  Safety protocols were in place, including screening questions prior to the visit, additional usage of staff PPE, and extensive cleaning of exam room while observing appropriate contact time as indicated for disinfecting solutions.   Assessment & Plan:

## 2020-03-15 NOTE — Patient Instructions (Signed)
There is a controlled release ambien if you want to try that for sleep instead.

## 2020-03-16 NOTE — Assessment & Plan Note (Signed)
Improving slightly with the improvement in sleep although this is still not ideal. She wishes to monitor symptoms at this time and make adjustments in the near future if needed.

## 2020-03-16 NOTE — Assessment & Plan Note (Signed)
Doing some better, trazodone did not help but using ambien. Talked to her about controlled release ambien as an option. She wishes to think about that and let us know if she wants to make that switch.

## 2020-03-16 NOTE — Assessment & Plan Note (Signed)
MRI was unable to rule out HIV changes, will need monitoring of progress with improved mood and sleeping. She did have very low nadir CD4 which is a risk factor for HIV associated changes. She is going to continue monitoring this and if needed can have neuropsych evaluation if this continues to limit her ability to work.

## 2020-03-22 ENCOUNTER — Ambulatory Visit: Payer: BC Managed Care – PPO | Admitting: Internal Medicine

## 2020-03-22 ENCOUNTER — Encounter: Payer: Self-pay | Admitting: Internal Medicine

## 2020-03-22 ENCOUNTER — Other Ambulatory Visit: Payer: Self-pay

## 2020-03-22 DIAGNOSIS — B20 Human immunodeficiency virus [HIV] disease: Secondary | ICD-10-CM | POA: Diagnosis not present

## 2020-03-22 DIAGNOSIS — G4700 Insomnia, unspecified: Secondary | ICD-10-CM

## 2020-03-22 DIAGNOSIS — F3341 Major depressive disorder, recurrent, in partial remission: Secondary | ICD-10-CM | POA: Diagnosis not present

## 2020-03-22 NOTE — Assessment & Plan Note (Signed)
Her depression seems to be under much better control but she is still struggling with anxiety.  We will arrange an initial visit with our behavioral health counselor.

## 2020-03-22 NOTE — Assessment & Plan Note (Signed)
She is still struggling with insomnia, daytime fatigue and some degree of brain fog.  I do not think she is likely to have HIV related dementia.

## 2020-03-22 NOTE — Assessment & Plan Note (Signed)
She is doing well with her Biktarvy therapy and unlike past regimen she is not having any side effects.  Her infection has come under control very rapidly and her CD4 count is reconstituting well.  She will continue Biktarvy and follow-up after lab work in 6 months.

## 2020-03-22 NOTE — Progress Notes (Signed)
Patient Active Problem List   Diagnosis Date Noted  . Human immunodeficiency virus (HIV) disease ([place]) 09/06/2006    Priority: High  . GENITAL HERPES 09/06/2006    Priority: Medium  . Memory loss 02/14/2020  . Insomnia 02/14/2020  . Herpes zoster 12/31/2019  . Normocytic anemia 03/01/2019  . Hyperglycemia 03/01/2019  . Obesity (BMI 30.0-34.9) 03/01/2019  . Elevated liver enzymes 02/28/2019  . Headache 06/30/2018  . Urinary frequency 05/01/2018  . Sciatica 05/26/2017  . 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 Low back pain 06/12/2016  . Essential hypertension 09/16/2014  . 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 Sinusitis 03/22/2009  . SYPHILIS NOS 09/06/2006  . Depression 09/06/2006  . PAP SMEAR, ABNORMAL 09/06/2006    Patient's Medications  New Prescriptions   No medications on file  Previous Medications   ACYCLOVIR OINTMENT (ZOVIRAX) 5 %    APPLY TOPICALLY EVERY 3 HOURS. MAY USE FOR UP TO 7 DAYS   ALBUTEROL (VENTOLIN HFA) 108 (90 BASE) MCG/ACT INHALER    Inhale 2 puffs into the lungs every 6 (six) hours as needed for wheezing or shortness of breath.   AMITRIPTYLINE (ELAVIL) 50 MG TABLET    Take 50 mg by mouth at bedtime.   BICTEGRAVIR-EMTRICITABINE-TENOFOVIR AF (BIKTARVY) 50-200-25 MG TABS TABLET    Take 1 tablet by mouth daily.   FLUTICASONE (FLONASE) 50 MCG/ACT NASAL SPRAY    Place 2 sprays into both nostrils daily. NEED OFFICE VISIT FOR FURTHER REFILLS   TRAZODONE (DESYREL) 100 MG TABLET    Take 1 tablet (100 mg total) by mouth at bedtime.   VALACYCLOVIR (VALTREX) 500 MG TABLET    1 po q day and increase to bid x 3 days with symptoms.   VITAMIN D, ERGOCALCIFEROL, (DRISDOL) 1.25 MG (50000 UNIT) CAPS CAPSULE    Take 1 capsule (50,000 Units total) by mouth every 7 (seven) days.  Modified Medications   No medications on file  Discontinued Medications   No medications on file    Subjective: Stephanie Espinoza is in for her routine HIV  follow-up visit.  She recently started [employer].  She is not having any side effects and denies missing any doses.  She is still struggling with some anxiety particularly around her ongoing risk for Covid infection even though she did receive her [employer] vaccine 2 months ago.  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  She knows she will be exposed to large numbers of people who have probably not been vaccinated.  She denies feeling depressed.  She is still not sleeping well and feels very fatigued during the day.  She has been taking trazodone and Ambien as needed.  She does not take both each evening.  She says the trazodone does put her to sleep but she often wakes up early and feels more sluggish the following day.  Ambien can help her fall asleep but she still has trouble staying asleep.  Review of Systems: Review of Systems  Constitutional: Positive for malaise/fatigue. Negative for chills, diaphoresis, fever and weight loss.  HENT: Negative for sore throat.   Respiratory: Negative for cough, sputum production and shortness of breath.   Cardiovascular: Negative for chest pain.  Gastrointestinal: Negative for abdominal pain, diarrhea, heartburn, nausea and vomiting.  Genitourinary: Negative for dysuria and frequency.  Musculoskeletal:  Negative for joint pain and myalgias.  Skin: Negative for rash.  Neurological: Negative for dizziness and headaches.  Psychiatric/Behavioral: Negative for depression and substance abuse. The patient is nervous/anxious.     Past Medical History:  Diagnosis Date  . Abnormal Pap smear of cervix before 1995   always a repeat back to normal except for 1 time and had colpo bioosy -resutls were normal  . Allergic rhinitis   . Anxiety   . Bell's palsy after 1995 ?   no residual SE.  [name] Depression   . Genital herpes   . History of drug abuse ([place])     drug usser [place].  cocaine  . HIV (human immunodeficiency virus infection) ([place]) 1990   from sexual contact who is unknown  . Syphilis    treated    Social History   Tobacco Use  . Smoking status: Former Smoker    Types: Cigarettes    Quit date: 03/01/1990    Years since quitting: 30.0  . Smokeless tobacco: Never Used  Substance Use Topics  . Alcohol use: No  . Drug use: No    Family History  Problem Relation Age of Onset  . Diabetes Father   . Breast cancer Other     Allergies  Allergen Reactions  . Haldol [Haloperidol] Other (See Comments)    Wipes out her memory  . Nevirapine     REACTION: Rash 10/08  . Thorazine [Chlorpromazine] Other (See Comments)    "makes me comatose"    Health Maintenance  Topic Date Due  . COVID-19 Vaccine (1) Never done  . INFLUENZA VACCINE  06/04/2020  . Fecal DNA (Cologuard)  02/07/2021  . MAMMOGRAM  08/17/2021  . PAP SMEAR-Modifier  04/22/2022  . TETANUS/TDAP  10/10/2026  . Hepatitis C Screening  Completed  . HIV Screening  Completed    Objective:  Vitals:   03/22/20 1048  BP: (!) 161/102  Pulse: 92  Temp: 98.4 F (36.9 C)  SpO2: 96%  Weight: 174 lb (78.9 kg)  Height: 5\' 2"  (1.575 m)   Body mass index is 31.83 kg/m.  Physical Exam Constitutional:      Comments: Her spirits seem better today.  Cardiovascular:     Rate and Rhythm: Normal rate and regular rhythm.     Heart sounds: No murmur.  Pulmonary:     Effort: Pulmonary effort is normal.     Breath sounds: Normal breath sounds.  Abdominal:     Palpations: Abdomen is soft.     Tenderness: There is no abdominal tenderness.  Musculoskeletal:        General: No swelling or tenderness.  Skin:    Findings: No rash.  Neurological:     General: No focal deficit present.  Psychiatric:        Mood and Affect: Mood normal.     Lab Results Lab Results  Component Value Date   WBC 3.8 (L) 12/29/2019   HGB 11.4 (L) 12/29/2019   HCT 34.6 (L) 12/29/2019    MCV 84.2 12/29/2019   PLT 194.0 12/29/2019    Lab Results  Component Value Date   CREATININE 1.07 12/29/2019   BUN 11 12/29/2019   NA 141 12/29/2019   K 3.5 12/29/2019   CL 106 12/29/2019   CO2 27 12/29/2019    Lab Results  Component Value Date   ALT 17 12/29/2019   AST 21 12/29/2019   ALKPHOS 81 12/29/2019   BILITOT 0.8 12/29/2019    Lab  Results  Component Value Date   CHOL 160 12/29/2019   HDL 34.40 (L) 12/29/2019   LDLCALC 98 12/29/2019   LDLDIRECT 102.1 03/02/2009   TRIG 135.0 12/29/2019   CHOLHDL 5 12/29/2019   Lab Results  Component Value Date   LABRPR NON-REACTIVE 12/29/2019   HIV 1 RNA Quant (copies/mL)  Date Value  02/22/2020 <20 DETECTED (A)  01/25/2020 2,490 (H)  12/29/2019 4,320 (H)   CD4 T Cell Abs (/uL)  Date Value  02/22/2020 242 (L)  01/25/2020 148 (L)  01/07/2019 230 (L)     Problem List Items Addressed This Visit      High   Human immunodeficiency virus (HIV) disease ([place])    She is doing well with her Biktarvy therapy and unlike past regimen she is not having any side effects.  Her infection has come under control very rapidly and her CD4 count is reconstituting well.  She will continue Biktarvy and follow-up after lab work in 6 months.      Relevant Orders   T-helper cell (CD4)- ([facility] clinic only)   HIV-1 RNA quant-no reflex-bld     Unprioritized   Insomnia    She is still struggling with insomnia, daytime fatigue and some degree of brain fog.  I do not think she is likely to have HIV related dementia.      Depression    Her depression seems to be under much better control but she is still struggling with anxiety.  We will arrange an initial visit with our behavioral health counselor.           Michel Bickers, MD [facility] for [facility] [place] [facility] [phone] pager   [phone] cell 03/22/2020, 12:03 PM

## 2020-04-04 ENCOUNTER — Ambulatory Visit: Payer: BC Managed Care – PPO

## 2020-04-07 NOTE — Progress Notes (Deleted)
64 y.o. [id number] Divorced   White or Caucasian Black or [place] American Other or two or more races female here for annual exam.    Patient's last menstrual period was 11/04/1997.          Sexually active: {yes no:314532}  The current method of family planning is post menopausal status.    Exercising: {yes no:314532}  {types:19826} Smoker:  {YES NO:22349}  Health Maintenance: Pap:  02-06-18 neg HPV HR + 16,18/45 neg, 04-23-2019 neg HPV HR + 16,18/45 neg History of abnormal Pap:  yes MMG:  08-19-2019 category b density birads 1:neg Colonoscopy:  cologuard 02-07-18 neg BMD:   2020 TDaP:  2017 Pneumonia vaccine(s):  2019 Shingrix:   *** Hep C testing: neg 2020 Screening Labs: ***   reports that she quit smoking about 30 years ago. Her smoking use included cigarettes. She has never used smokeless tobacco. She reports that she does not drink alcohol or use drugs.  Past Medical History:  Diagnosis Date  . Abnormal Pap smear of cervix before 1995   always a repeat back to normal except for 1 time and had colpo bioosy -resutls were normal  . Allergic rhinitis   . Anxiety   . Bell's palsy after 1995 ?   no residual SE.  [name] Depression   . Genital herpes   . History of drug abuse ([place])    drug usser [place].  cocaine  . HIV (human immunodeficiency virus infection) ([place]) 1990   from sexual contact who is unknown  . Syphilis    treated    Past Surgical History:  Procedure Laterality Date  . CESAREAN SECTION  1997  . ESOPHAGOGASTRODUODENOSCOPY (EGD) WITH PROPOFOL N/A 03/02/2019   Procedure: ESOPHAGOGASTRODUODENOSCOPY (EGD) WITH PROPOFOL;  Surgeon: Arta Silence, MD;  Location: [facility];  Service: Endoscopy;  Laterality: N/A;  . EUS N/A 03/02/2019   Procedure: FULL UPPER ENDOSCOPIC ULTRASOUND (EUS) RADIAL;  Surgeon: Arta Silence, MD;  Location: [facility];  Service: Endoscopy;  Laterality: N/A;  . LAPAROSCOPIC CHOLECYSTECTOMY SINGLE PORT N/A 03/01/2019   Procedure:  LAPAROSCOPIC CHOLECYSTECTOMY SINGLE SITE/ INTRAOPERATIVE CHOLANGIOGRAM;  Surgeon: Michael Boston, MD;  Location: [facility];  Service: General;  Laterality: N/A;    Current Outpatient Medications  Medication Sig Dispense Refill  . acyclovir ointment (ZOVIRAX) 5 % APPLY TOPICALLY EVERY 3 HOURS. MAY USE FOR UP TO 7 DAYS 30 g 1  . albuterol (VENTOLIN HFA) 108 (90 Base) MCG/ACT inhaler Inhale 2 puffs into the lungs every 6 (six) hours as needed for wheezing or shortness of breath. 18 g 2  . amitriptyline (ELAVIL) 50 MG tablet Take 50 mg by mouth at bedtime.    . bictegravir-emtricitabine-tenofovir AF (BIKTARVY) 50-200-25 MG TABS tablet Take 1 tablet by mouth daily. 30 tablet 11  . fluticasone (FLONASE) 50 MCG/ACT nasal spray Place 2 sprays into both nostrils daily. NEED OFFICE VISIT FOR FURTHER REFILLS 48 mL 0  . traZODone (DESYREL) 100 MG tablet Take 1 tablet (100 mg total) by mouth at bedtime. 90 tablet 1  . valACYclovir (VALTREX) 500 MG tablet 1 po q day and increase to bid x 3 days with symptoms. 90 tablet 2  . Vitamin D, Ergocalciferol, (DRISDOL) 1.25 MG (50000 UNIT) CAPS capsule Take 1 capsule (50,000 Units total) by mouth every 7 (seven) days. 12 capsule 0   No current facility-administered medications for this visit.    Family History  Problem Relation Age of Onset  . Diabetes Father   . Breast cancer Other  Review of Systems  Exam:   LMP 11/04/1997      General appearance: alert, cooperative and appears stated age Head: Normocephalic, without obvious abnormality, atraumatic Neck: no adenopathy, supple, symmetrical, trachea midline and thyroid {EXAM; THYROID:18604} Lungs: clear to auscultation bilaterally Breasts: {Exam; breast:13139::"normal appearance, no masses or tenderness"} Heart: regular rate and rhythm Abdomen: soft, non-tender; bowel sounds normal; no masses,  no organomegaly Extremities: extremities normal, atraumatic, no cyanosis or edema Skin: Skin color, texture,  turgor normal. No rashes or lesions Lymph nodes: Cervical, supraclavicular, and axillary nodes normal. No abnormal inguinal nodes palpated Neurologic: Grossly normal   Pelvic: External genitalia:  no lesions              Urethra:  normal appearing urethra with no masses, tenderness or lesions              Bartholins and Skenes: normal                 Vagina: normal appearing vagina with normal color and discharge, no lesions              Cervix: {exam; cervix:14595}              Pap taken: {yes no:314532} Bimanual Exam:  Uterus:  {exam; uterus:12215}              Adnexa: {exam; adnexa:12223}               Rectovaginal: Confirms               Anus:  normal sphincter tone, no lesions  Chaperone, ***Terence Lux, CMA, was present for exam.  A:  Well Woman with normal exam  P:   {plan; gyn:5269::"mammogram","pap smear","return annually or prn"}

## 2020-04-10 ENCOUNTER — Other Ambulatory Visit: Payer: Self-pay

## 2020-04-11 ENCOUNTER — Ambulatory Visit: Payer: BC Managed Care – PPO | Admitting: Obstetrics & Gynecology

## 2020-05-02 ENCOUNTER — Other Ambulatory Visit: Payer: Self-pay | Admitting: Internal Medicine

## 2020-05-02 NOTE — Progress Notes (Signed)
64 y.o. [id number] Divorced AA female here for AEX.  Reports she's had a good year despite Covid.  Had some insomnia issues and some memory changes.  She had an MRI with chronic microvascular ischemic changes and chronic left thalamic infarct.    Denies vaginal bleeding.    Has h/o HIV with undetectable viral load for many years.  About 4 months ago, her numbers changed so she is now on therapy.  She had follow up in six months with Dr. Megan Salon.    Patient's last menstrual period was 11/04/1997.          Sexually active: Yes.    The current method of family planning is post menopausal status & condoms all the time.    Exercising: No.  exercise Smoker:  no  Health Maintenance: Pap:  02-06-18 neg HPV HR + 16,18/45 neg, 04-23-2019 neg HPV HR+ 16,18/45 neg History of abnormal Pap:  yes MMG:  08-19-2019 category b density birads 1:neg Colonoscopy:  cologuard 2019 neg BMD:   2020.  Consider repeat in 5 years. TDaP:  2017 Pneumonia vaccine(s):  2019 Shingrix:   Not done Hep C testing: neg 2020 Screening Labs: 12/2019 and 02/2020   reports that she quit smoking about 30 years ago. Her smoking use included cigarettes. She has never used smokeless tobacco. She reports that she does not drink alcohol and does not use drugs.  Past Medical History:  Diagnosis Date  . Abnormal Pap smear of cervix before 1995   always a repeat back to normal except for 1 time and had colpo bioosy -resutls were normal  . Allergic rhinitis   . Anxiety   . Bell's palsy after 1995 ?   no residual SE.  [name] Depression   . Genital herpes   . History of drug abuse ([place])    drug usser [place].  cocaine  . HIV (human immunodeficiency virus infection) ([place]) 1990   from sexual contact who is unknown  . Syphilis    treated    Past Surgical History:  Procedure Laterality Date  . CESAREAN SECTION  1997  . ESOPHAGOGASTRODUODENOSCOPY (EGD) WITH PROPOFOL N/A 03/02/2019   Procedure: ESOPHAGOGASTRODUODENOSCOPY (EGD) WITH  PROPOFOL;  Surgeon: Arta Silence, MD;  [facility];  Service: Endoscopy;  Laterality: N/A;  . EUS N/A 03/02/2019   Procedure: FULL UPPER ENDOSCOPIC ULTRASOUND (EUS) RADIAL;  Surgeon: Arta Silence, MD;  [facility];  Service: Endoscopy;  Laterality: N/A;  . LAPAROSCOPIC CHOLECYSTECTOMY SINGLE PORT N/A 03/01/2019   Procedure: LAPAROSCOPIC CHOLECYSTECTOMY SINGLE SITE/ INTRAOPERATIVE CHOLANGIOGRAM;  Surgeon: Michael Boston, MD;  Location: [facility];  Service: General;  Laterality: N/A;    Current Outpatient Medications  Medication Sig Dispense Refill  . acyclovir ointment (ZOVIRAX) 5 % APPLY TOPICALLY EVERY 3 HOURS. MAY USE FOR UP TO 7 DAYS 30 g 1  . albuterol (VENTOLIN HFA) 108 (90 Base) MCG/ACT inhaler Inhale 2 puffs into the lungs every 6 (six) hours as needed for wheezing or shortness of breath. 18 g 2  . amitriptyline (ELAVIL) 50 MG tablet Take 50 mg by mouth at bedtime.    . bictegravir-emtricitabine-tenofovir AF (BIKTARVY) 50-200-25 MG TABS tablet Take 1 tablet by mouth daily. 30 tablet 11  . fluticasone (FLONASE) 50 MCG/ACT nasal spray Place 2 sprays into both nostrils daily. NEED OFFICE VISIT FOR FURTHER REFILLS 48 mL 0  . traZODone (DESYREL) 100 MG tablet Take 1 tablet (100 mg total) by mouth at bedtime. 90 tablet 1  . valACYclovir (VALTREX) 500  MG tablet 1 po q day and increase to bid x 3 days with symptoms. 90 tablet 2  . Vitamin D, Ergocalciferol, (DRISDOL) 1.25 MG (50000 UNIT) CAPS capsule TAKE 1 CAPSULE (50,000 UNITS TOTAL) BY MOUTH EVERY 7 (SEVEN) DAYS. 12 capsule 0   No current facility-administered medications for this visit.    Family History  Problem Relation Age of Onset  . Diabetes Father   . Breast cancer Other     Review of Systems  Constitutional: Negative.   HENT: Negative.   Eyes: Negative.   Respiratory: Negative.   Cardiovascular: Negative.   Gastrointestinal: Negative.   Endocrine: Negative.   Genitourinary: Negative.    Musculoskeletal: Negative.   Skin: Negative.   Allergic/Immunologic: Negative.   Neurological: Negative.   Hematological: Negative.   Psychiatric/Behavioral: Negative.     Exam:   Ht 5\' 2"  (1.575 m)   Wt 176 lb (79.8 kg)   LMP 11/04/1997   BMI 32.19 kg/m   Height: 5\' 2"  (157.5 cm)  General appearance: alert, cooperative and appears stated age Head: Normocephalic, without obvious abnormality, atraumatic Neck: no adenopathy, supple, symmetrical, trachea midline and thyroid normal to inspection and palpation Lungs: clear to auscultation bilaterally Breasts: normal appearance, no masses or tenderness Heart: regular rate and rhythm Abdomen: soft, non-tender; bowel sounds normal; no masses,  no organomegaly Extremities: extremities normal, atraumatic, no cyanosis or edema Skin: Skin color, texture, turgor normal. No rashes or lesions Lymph nodes: Cervical, supraclavicular, and axillary nodes normal. No abnormal inguinal nodes palpated Neurologic: Grossly normal   Pelvic: External genitalia:  no lesions              Urethra:  normal appearing urethra with no masses, tenderness or lesions              Bartholins and Skenes: normal                 Vagina: normal appearing vagina with normal color and discharge, no lesions              Cervix: no lesions              Pap taken: Yes.   Bimanual Exam:  Uterus:  normal size, contour, position, consistency, mobility, non-tender              Adnexa: normal adnexa and no mass, fullness, tenderness               Rectovaginal: Confirms               Anus:  normal sphincter tone, no lesions  Chaperone, Royal Hawthorn, CMA, was present for exam.  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  This is UTD pap smear and HR HPV obtained today Cologuard neg 2019.  Repeat 2022 RF for valtrex 500mg  daily and increasing to bid x 3 days if needed for symptoms BMD UTD Vaccines reviewed and UTD.  Received J&J Covid vaccine. Return annually or prn

## 2020-05-09 ENCOUNTER — Ambulatory Visit: Payer: BC Managed Care – PPO | Admitting: Obstetrics & Gynecology

## 2020-05-09 ENCOUNTER — Other Ambulatory Visit: Payer: Self-pay

## 2020-05-09 ENCOUNTER — Other Ambulatory Visit (HOSPITAL_COMMUNITY)
Admission: RE | Admit: 2020-05-09 | Discharge: 2020-05-09 | Disposition: A | Discharge status: Home / Self Care | Payer: BC Managed Care – PPO | Source: Ambulatory Visit | Attending: Obstetrics & Gynecology | Admitting: Obstetrics & Gynecology

## 2020-05-09 ENCOUNTER — Encounter: Payer: Self-pay | Admitting: Obstetrics & Gynecology

## 2020-05-09 VITALS — BP 124/84 | HR 70 | Resp 16 | Ht 62.0 in | Wt 176.0 lb

## 2020-05-09 DIAGNOSIS — Z124 Encounter for screening for malignant neoplasm of cervix: Secondary | ICD-10-CM | POA: Insufficient documentation

## 2020-05-09 DIAGNOSIS — B977 Papillomavirus as the cause of diseases classified elsewhere: Secondary | ICD-10-CM | POA: Diagnosis not present

## 2020-05-09 DIAGNOSIS — Z8619 Personal history of other infectious and parasitic diseases: Secondary | ICD-10-CM

## 2020-05-09 DIAGNOSIS — Z01419 Encounter for gynecological examination (general) (routine) without abnormal findings: Secondary | ICD-10-CM | POA: Diagnosis not present

## 2020-05-09 MED ORDER — VALACYCLOVIR HCL 500 MG PO TABS: ORAL_TABLET | ORAL | 3 refills | Status: DC

## 2020-05-09 MED ORDER — ACYCLOVIR 5 % EX OINT: TOPICAL_OINTMENT | CUTANEOUS | 3 refills | Status: DC

## 2020-05-13 ENCOUNTER — Encounter: Payer: Self-pay | Admitting: Obstetrics & Gynecology

## 2020-05-14 DIAGNOSIS — Z8619 Personal history of other infectious and parasitic diseases: Secondary | ICD-10-CM | POA: Insufficient documentation

## 2020-05-15 NOTE — Telephone Encounter (Signed)
Pt need to make virtual appt.Marland KitchenJohny Espinoza

## 2020-05-16 ENCOUNTER — Telehealth (INDEPENDENT_AMBULATORY_CARE_PROVIDER_SITE_OTHER): Payer: BC Managed Care – PPO | Admitting: Internal Medicine

## 2020-05-16 ENCOUNTER — Ambulatory Visit (INDEPENDENT_AMBULATORY_CARE_PROVIDER_SITE_OTHER): Payer: BC Managed Care – PPO

## 2020-05-16 ENCOUNTER — Encounter: Payer: Self-pay | Admitting: Internal Medicine

## 2020-05-16 ENCOUNTER — Encounter: Payer: Self-pay | Admitting: Obstetrics & Gynecology

## 2020-05-16 DIAGNOSIS — G4700 Insomnia, unspecified: Secondary | ICD-10-CM | POA: Diagnosis not present

## 2020-05-16 DIAGNOSIS — R32 Unspecified urinary incontinence: Secondary | ICD-10-CM

## 2020-05-16 DIAGNOSIS — R5383 Other fatigue: Secondary | ICD-10-CM

## 2020-05-16 DIAGNOSIS — R05 Cough: Secondary | ICD-10-CM

## 2020-05-16 DIAGNOSIS — R059 Cough, unspecified: Secondary | ICD-10-CM

## 2020-05-16 LAB — CYTOLOGY - PAP
Comment: NEGATIVE
High risk HPV: POSITIVE — AB

## 2020-05-16 MED ORDER — ALBUTEROL SULFATE HFA 108 (90 BASE) MCG/ACT IN AERS: 2.0000 | INHALATION_SPRAY | Freq: Four times a day (QID) | RESPIRATORY_TRACT | 2 refills | Status: DC | PRN

## 2020-05-16 NOTE — Progress Notes (Signed)
Virtual Visit via Video Note  I connected with Stephanie Espinoza on 05/16/20 at  9:00 AM EDT by a video enabled telemedicine application and verified that I am speaking with the correct person using two identifiers.  The patient and the provider were at separate locations throughout the entire encounter. Patient location: home, Provider location: work   I discussed the limitations of evaluation and management by telemedicine and the availability of in person appointments. The patient expressed understanding and agreed to proceed. The patient and the provider were the only parties present for the visit unless noted in HPI below.  History of Present Illness: The patient is a 64 y.o. female with visit for several concerns including cough (going on a month or longer, concurrent HIV and recently started on HAART therapy, does have some SOB but not significant, cough mostly non-productive, only present when in her house, outdoors and other locations it can almost go away, overall stable since onset, has been taking allergy medication which has not helped) and insomnia/fatigue (since starting HAART therapy and even before she has been with problems sleeping and fatigue, she is now sleeping better on average, still some problems with falling asleep but not as often, has been on HAART >3 months at this time, still feels like she is not getting used to this) and urinary incontinence (not feeling when she has to urinate, then looks down and is wet, still has sensation in the inguinal region, denies burning with going, this has been going on for several weeks).   Observations/Objective: Appearance: normal, some dry coughing during visit, breathing appears normal, casual grooming, abdomen does not appear distended, throat not well visualized, memory not tested, mental status is A and O times 3  Assessment and Plan: See problem oriented charting  Follow Up Instructions: checking labs and CXR today to assess  I  discussed the assessment and treatment plan with the patient. The patient was provided an opportunity to ask questions and all were answered. The patient agreed with the plan and demonstrated an understanding of the instructions.   The patient was advised to call back or seek an in-person evaluation if the symptoms worsen or if the condition fails to improve as anticipated.  Hoyt Koch, MD

## 2020-05-16 NOTE — Assessment & Plan Note (Signed)
U/A ordered to rule out infection. Given lack of sensation and awareness of needing to urinate needs further workup if U/A not revealing.

## 2020-05-16 NOTE — Assessment & Plan Note (Addendum)
Checking CXR and CBC to evaluate. Sounds to be more environmental as related to being in her home.

## 2020-05-16 NOTE — Assessment & Plan Note (Signed)
Unclear etiology, could be related to some IRIS or from HAART therapy. Her CD4 counts are up at last check in April. Checking CBC, CMP, thyroid, vitamin levels to assess. Treat as appropriate.

## 2020-05-16 NOTE — Assessment & Plan Note (Signed)
Better overall and using trazodone rarely as it makes her drowsy in the morning after taking.

## 2020-05-17 ENCOUNTER — Other Ambulatory Visit: Payer: Self-pay | Admitting: Internal Medicine

## 2020-05-17 ENCOUNTER — Telehealth: Payer: Self-pay

## 2020-05-17 DIAGNOSIS — B977 Papillomavirus as the cause of diseases classified elsewhere: Secondary | ICD-10-CM

## 2020-05-17 DIAGNOSIS — R87619 Unspecified abnormal cytological findings in specimens from cervix uteri: Secondary | ICD-10-CM

## 2020-05-17 LAB — VITAMIN D 25 HYDROXY (VIT D DEFICIENCY, FRACTURES): Vit D, 25-Hydroxy: 53 ng/mL (ref 30–100)

## 2020-05-17 LAB — URINALYSIS, ROUTINE W REFLEX MICROSCOPIC
Bacteria, UA: NONE SEEN /HPF
Bilirubin Urine: NEGATIVE
Glucose, UA: NEGATIVE
Hgb urine dipstick: NEGATIVE
Hyaline Cast: NONE SEEN /LPF
Ketones, ur: NEGATIVE
Nitrite: NEGATIVE
Protein, ur: NEGATIVE
Specific Gravity, Urine: 1.02 (ref 1.001–1.03)
pH: 6 (ref 5.0–8.0)

## 2020-05-17 LAB — VITAMIN B12: Vitamin B-12: 537 pg/mL (ref 200–1100)

## 2020-05-17 LAB — CBC
HCT: 36.7 % (ref 35.0–45.0)
Hemoglobin: 11.6 g/dL — ABNORMAL LOW (ref 11.7–15.5)
MCH: 27.9 pg (ref 27.0–33.0)
MCHC: 31.6 g/dL — ABNORMAL LOW (ref 32.0–36.0)
MCV: 88.2 fL (ref 80.0–100.0)
MPV: 10.1 fL (ref 7.5–12.5)
Platelets: 240 10*3/uL (ref 140–400)
RBC: 4.16 10*6/uL (ref 3.80–5.10)
RDW: 12.4 % (ref 11.0–15.0)
WBC: 5.3 10*3/uL (ref 3.8–10.8)

## 2020-05-17 LAB — COMPREHENSIVE METABOLIC PANEL
AG Ratio: 0.9 (calc) — ABNORMAL LOW (ref 1.0–2.5)
ALT: 14 U/L (ref 6–29)
AST: 18 U/L (ref 10–35)
Albumin: 3.7 g/dL (ref 3.6–5.1)
Alkaline phosphatase (APISO): 86 U/L (ref 37–153)
BUN/Creatinine Ratio: 13 (calc) (ref 6–22)
BUN: 14 mg/dL (ref 7–25)
CO2: 28 mmol/L (ref 20–32)
Calcium: 9.4 mg/dL (ref 8.6–10.4)
Chloride: 105 mmol/L (ref 98–110)
Creat: 1.1 mg/dL — ABNORMAL HIGH (ref 0.50–0.99)
Globulin: 4 g/dL (calc) — ABNORMAL HIGH (ref 1.9–3.7)
Glucose, Bld: 101 mg/dL — ABNORMAL HIGH (ref 65–99)
Potassium: 4.1 mmol/L (ref 3.5–5.3)
Sodium: 140 mmol/L (ref 135–146)
Total Bilirubin: 0.7 mg/dL (ref 0.2–1.2)
Total Protein: 7.7 g/dL (ref 6.1–8.1)

## 2020-05-17 LAB — TSH: TSH: 1.78 mIU/L (ref 0.40–4.50)

## 2020-05-17 MED ORDER — NITROFURANTOIN MONOHYD MACRO 100 MG PO CAPS: 100.0000 mg | ORAL_CAPSULE | Freq: Two times a day (BID) | ORAL | 0 refills | Status: DC

## 2020-05-17 NOTE — Telephone Encounter (Signed)
Pt sent mychart message wanting to know about pap results from 05/09/20  Routing to Dr Sabra Heck, please advise

## 2020-05-17 NOTE — Telephone Encounter (Signed)
Pt sent following mychart message:  Stephanie, Doan, MD 20 hours ago (2:52 PM)   OK now what's next?

## 2020-05-18 NOTE — Telephone Encounter (Signed)
Results note routed to you as well.  Pt has atypia noted on pap with +HR HPV so needs colposcopy this year.  Please schedule.  Thanks.

## 2020-05-18 NOTE — Telephone Encounter (Signed)
Call to patient. Advised of Pap result and recommendation from Dr Sabra Heck.  Brief review of procedure, patient has had before and denies questions. Patient PMP. Colpo scheduled for 05-25-20 at 430. Patient instructed to take Motrin 800 mg one hour prior with food.    Encounter closed.

## 2020-05-19 ENCOUNTER — Telehealth: Payer: Self-pay | Admitting: Obstetrics & Gynecology

## 2020-05-19 NOTE — Telephone Encounter (Signed)
Call placed to convey benefits for colposcopy. °

## 2020-05-22 NOTE — Telephone Encounter (Signed)
Dr.Crawford please advise on patient request.  Patient informed PCP is out of office.

## 2020-05-24 ENCOUNTER — Other Ambulatory Visit (HOSPITAL_COMMUNITY)
Admission: RE | Admit: 2020-05-24 | Discharge: 2020-05-24 | Disposition: A | Discharge status: Home / Self Care | Payer: BC Managed Care – PPO | Source: Ambulatory Visit | Attending: Obstetrics & Gynecology | Admitting: Obstetrics & Gynecology

## 2020-05-24 ENCOUNTER — Other Ambulatory Visit: Payer: Self-pay

## 2020-05-24 ENCOUNTER — Encounter: Payer: Self-pay | Admitting: Obstetrics & Gynecology

## 2020-05-24 ENCOUNTER — Ambulatory Visit: Payer: BC Managed Care – PPO | Admitting: Obstetrics & Gynecology

## 2020-05-24 VITALS — BP 134/80 | HR 88 | Ht 62.0 in | Wt 176.8 lb

## 2020-05-24 DIAGNOSIS — B977 Papillomavirus as the cause of diseases classified elsewhere: Secondary | ICD-10-CM | POA: Insufficient documentation

## 2020-05-24 DIAGNOSIS — R87619 Unspecified abnormal cytological findings in specimens from cervix uteri: Secondary | ICD-10-CM | POA: Diagnosis not present

## 2020-05-24 NOTE — Progress Notes (Signed)
64 y.o. [id number] Divorced AA female here for colposcopy with +HR HPV.  This was present last year and with new ASCCP guidelines, colposcopy recommended.     Patient's last menstrual period was 11/04/1997.          Sexually active: Yes.    The current method of family planning is post menopausal status.     Patient has been counseled about results and procedure.  Risks and benefits have bene reviewed including immediate and/or delayed bleeding, infection, cervical scaring from procedure, possibility of needing additional follow up as well as treatment.  rare risks of missing a lesion discussed as well.  All questions answered.  Pt ready to proceed.  BP 134/80   Pulse 88   Ht 5\' 2"  (1.575 m)   Wt 176 lb 12.8 oz (80.2 kg)   LMP 11/04/1997   SpO2 97%   BMI 32.34 kg/m   Physical Exam Constitutional:      Appearance: Normal appearance.  Genitourinary:    General: Normal vulva.     Cervix: Normal.     Comments: Vaginal atrophic changes noted Neurological:     General: No focal deficit present.     Mental Status: She is alert and oriented to person, place, and time.  Psychiatric:        Mood and Affect: Mood normal.    Speculum placed.  3% acetic acid applied to cervix for >45 seconds.  Cervix visualized with both 7.5X and 15X magnification.  Green filter also used.  Lugols solution was used.  Findings:  No abnormal staining on cervix but there were two small areas of decreased staining on left inferior vaginal.  Biopsy:  Vaginal biopsy obtained.  ECC:  was not performed.  Monsel's was not needed.  Excellent hemostasis was present.  Pt tolerated procedure well and all instruments were removed.    Chaperone, Gae Dry, was present during procedure.  Assessment:  +HR HPV, HIV +  Plan:  Pathology results will be called to patient and follow-up planned pending results.

## 2020-05-24 NOTE — Addendum Note (Signed)
Addended by: Megan Salon on: 05/24/2020 01:51 PM   Modules accepted: Orders

## 2020-05-25 ENCOUNTER — Ambulatory Visit: Payer: BC Managed Care – PPO | Admitting: Obstetrics & Gynecology

## 2020-05-31 ENCOUNTER — Encounter: Payer: Self-pay | Admitting: Internal Medicine

## 2020-05-31 LAB — SURGICAL PATHOLOGY

## 2020-06-07 ENCOUNTER — Encounter: Payer: Self-pay | Admitting: Internal Medicine

## 2020-06-11 ENCOUNTER — Encounter: Payer: Self-pay | Admitting: Internal Medicine

## 2020-06-13 ENCOUNTER — Telehealth (INDEPENDENT_AMBULATORY_CARE_PROVIDER_SITE_OTHER): Payer: BC Managed Care – PPO | Admitting: Internal Medicine

## 2020-06-13 ENCOUNTER — Encounter: Payer: Self-pay | Admitting: Internal Medicine

## 2020-06-13 DIAGNOSIS — B029 Zoster without complications: Secondary | ICD-10-CM

## 2020-06-13 NOTE — Assessment & Plan Note (Signed)
Recurrent and she is taking valtrex. Advised on dosing and interval and length to take this. Does not need medication for the pain and likely quick onset of valtrex helped overall.

## 2020-06-13 NOTE — Progress Notes (Signed)
Virtual Visit via Video Note  I connected with Stephanie Espinoza on 06/13/20 at  8:40 AM EDT by a video enabled telemedicine application and verified that I am speaking with the correct person using two identifiers.  The patient and the provider were at separate locations throughout the entire encounter. Patient location: home, Provider location: work   I discussed the limitations of evaluation and management by telemedicine and the availability of in person appointments. The patient expressed understanding and agreed to proceed. The patient and the provider were the only parties present for the visit unless noted in HPI below.  History of Present Illness: The patient is a 64 y.o. female with visit for possible shingles. Started Sunday. Has had low grade fevers which is gone the last couple days. Took valtrex she has at home TID 500 mg. She got rash on the left neck and lower face. Having some extra itching and burning kind of pain. Denies fevers but some chill. Overall it is improving since doing the valtrex. Rash is improving but not totally gone. Sensation of pain is improving but still some prickly type sensation on left neck and lower cheek. Has tried valtrex  Observations/Objective: Appearance: normal, slight rash on the left neck area, breathing appears normal, no coughing or dyspnea during visit, casual grooming, abdomen does not appear distended, throat normal, memory A and O times 3  Assessment and Plan: See problem oriented charting  Follow Up Instructions: continue valtrex 500 mg TID, can return to work on 06/14/20 and work note given  Visit time 20 minutes in face to face communication with patient and coordination of care, additional 0 minutes spent in record review, coordination or care, ordering tests, communicating/referring to other healthcare professionals, documenting in medical records all on the same day of the visit for total time 20 minutes spent on the visit.   I discussed the  assessment and treatment plan with the patient. The patient was provided an opportunity to ask questions and all were answered. The patient agreed with the plan and demonstrated an understanding of the instructions.   The patient was advised to call back or seek an in-person evaluation if the symptoms worsen or if the condition fails to improve as anticipated.  Hoyt Koch, MD

## 2020-07-20 ENCOUNTER — Encounter: Payer: Self-pay | Admitting: Internal Medicine

## 2020-07-24 NOTE — Telephone Encounter (Signed)
Stephanie Espinoza are you okay with signing the renewal forms while Dr.Crawford is out of office?

## 2020-07-24 NOTE — Telephone Encounter (Signed)
Forms have been completed and placed in providers box to review and sign.  °

## 2020-07-25 DIAGNOSIS — Z0279 Encounter for issue of other medical certificate: Secondary | ICD-10-CM

## 2020-07-25 NOTE — Telephone Encounter (Signed)
Forms have been signed, Faxed to [employer] @[phone], Copy sent to scan &Charged for.   LVM and  Mychart message to inform patient.

## 2020-07-27 ENCOUNTER — Other Ambulatory Visit: Payer: Self-pay | Admitting: Internal Medicine

## 2020-08-08 ENCOUNTER — Other Ambulatory Visit: Payer: Self-pay | Admitting: Internal Medicine

## 2020-08-16 ENCOUNTER — Ambulatory Visit (INDEPENDENT_AMBULATORY_CARE_PROVIDER_SITE_OTHER): Payer: BC Managed Care – PPO | Admitting: Internal Medicine

## 2020-08-16 ENCOUNTER — Other Ambulatory Visit: Payer: Self-pay

## 2020-08-16 ENCOUNTER — Encounter: Payer: Self-pay | Admitting: Internal Medicine

## 2020-08-16 DIAGNOSIS — G4700 Insomnia, unspecified: Secondary | ICD-10-CM

## 2020-08-16 DIAGNOSIS — F331 Major depressive disorder, recurrent, moderate: Secondary | ICD-10-CM

## 2020-08-16 DIAGNOSIS — B20 Human immunodeficiency virus [HIV] disease: Secondary | ICD-10-CM

## 2020-08-16 NOTE — Assessment & Plan Note (Signed)
I believe that her insomnia is probably related to her depression and work stress rather than [employer].

## 2020-08-16 NOTE — Assessment & Plan Note (Signed)
Her HIV infection came under good control after restarting Biktarvy earlier this year and she has had some CD4 reconstitution.  Stephanie Espinoza has had a great deal of difficulty tolerating all antiretroviral regimens she has been on over the past several decades.  She has a strong belief that medications will cause her side effects.  She has cycled on and off therapy.  I told her that taking half the dose is extremely dangerous and could promote resistance.  She is interested in stopping therapy now to see if she improves.  I will check blood work today and see her back next month.

## 2020-08-16 NOTE — Assessment & Plan Note (Signed)
I believe that a lot if not all of her current symptoms could be related to worsening depression with superimposed anxiety.  She is interested in meeting with our behavioral health counselor.

## 2020-08-16 NOTE — Progress Notes (Signed)
Patient Active Problem List   Diagnosis Date Noted  . Human immunodeficiency virus (HIV) disease ([place]) 09/06/2006    Priority: High  . GENITAL HERPES 09/06/2006    Priority: Medium  . Urinary incontinence 05/16/2020  . Other fatigue 05/16/2020  . History of syphilis 05/14/2020  . Memory loss 02/14/2020  . Insomnia 02/14/2020  . Herpes zoster 12/31/2019  . Normocytic anemia 03/01/2019  . Hyperglycemia 03/01/2019  . Obesity (BMI 30.0-34.9) 03/01/2019  . Headache 06/30/2018  . Urinary frequency 05/01/2018  . Sciatica 05/26/2017  . Cough 11/05/2016  . 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 Low back pain 06/12/2016  . Essential hypertension 09/16/2014  . 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 Sinusitis 03/22/2009  . Depression 09/06/2006  . PAP SMEAR, ABNORMAL 09/06/2006    Patient's Medications  New Prescriptions   No medications on file  Previous Medications   ACYCLOVIR OINTMENT (ZOVIRAX) 5 %    Apply topically every 3 (three) hours.   ALBUTEROL (VENTOLIN HFA) 108 (90 BASE) MCG/ACT INHALER    Inhale 2 puffs into the lungs every 6 (six) hours as needed for wheezing or shortness of breath.   AMITRIPTYLINE (ELAVIL) 50 MG TABLET    Take 50 mg by mouth at bedtime.   FLUTICASONE (FLONASE) 50 MCG/ACT NASAL SPRAY    Place 2 sprays into both nostrils daily. NEED OFFICE VISIT FOR FURTHER REFILLS   TRAZODONE (DESYREL) 100 MG TABLET    TAKE 1 TABLET BY MOUTH EVERYDAY AT BEDTIME   VALACYCLOVIR (VALTREX) 500 MG TABLET    1 po q day and increase to bid x 3 days with symptoms.   VITAMIN D, ERGOCALCIFEROL, (DRISDOL) 1.25 MG (50000 UNIT) CAPS CAPSULE    TAKE 1 CAPSULE (50,000 UNITS TOTAL) BY MOUTH EVERY 7 (SEVEN) DAYS.  Modified Medications   No medications on file  Discontinued Medications   BICTEGRAVIR-EMTRICITABINE-TENOFOVIR AF (BIKTARVY) 50-200-25 MG TABS TABLET    Take 1 tablet by mouth daily.    Subjective: Stephanie Espinoza is seen on a work-in  basis.  She has been feeling worse recently.  She has been feeling more fatigued, having night sweats, some mild hacking cough and shortness of breath, intermittent nausea, arthralgias, myalgias, dizziness, headaches, brain fog and worsening depression and anxiety.  She continues to have problems falling asleep and staying asleep.  She attributes some of this to the Covid pandemic and increased demands at work but she is also concerned that some of her symptoms may be related to [place].  2 weeks ago she began breaking her Biktarvy in half and is only taking half a tablet daily.  She has not noted any difference in her symptoms.  Review of Systems: Review of Systems  Constitutional: Positive for diaphoresis and malaise/fatigue. Negative for chills, fever and weight loss.  HENT: Positive for congestion.   Respiratory: Positive for cough and shortness of breath. Negative for sputum production and wheezing.   Cardiovascular: Negative for chest pain.  Gastrointestinal: Positive for abdominal pain, heartburn and nausea. Negative for constipation, diarrhea and vomiting.  Genitourinary: Negative for dysuria.  Musculoskeletal: Positive for joint pain and myalgias. Negative for falls.  Neurological: Positive for dizziness and headaches.  Psychiatric/Behavioral: Positive for depression and memory loss. The patient is nervous/anxious and has insomnia.     Past Medical History:  Diagnosis Date  . Abnormal Pap smear of cervix before 1995   always a repeat back to normal except for 1  time and had colpo bioosy -resutls were normal  . Allergic rhinitis   . Anxiety   . Bell's palsy after 1995 ?   no residual SE.  [name] Depression   . Genital herpes   . History of drug abuse ([place])    drug usser [place].  cocaine  . HIV (human immunodeficiency virus infection) ([place]) 1990   from sexual contact who is unknown  . Syphilis    treated    Social History   Tobacco Use  . Smoking status: Former Smoker     Types: Cigarettes    Quit date: 03/01/1990    Years since quitting: 30.4  . Smokeless tobacco: Never Used  Vaping Use  . Vaping Use: Never used  Substance Use Topics  . Alcohol use: No  . Drug use: No    Family History  Problem Relation Age of Onset  . Diabetes Father   . Breast cancer Other     Allergies  Allergen Reactions  . Haldol [Haloperidol] Other (See Comments)    Wipes out her memory  . Nevirapine     REACTION: Rash 10/08  . Thorazine [Chlorpromazine] Other (See Comments)    "makes me comatose"    Health Maintenance  Topic Date Due  . INFLUENZA VACCINE  06/04/2020  . Fecal DNA (Cologuard)  02/07/2021  . MAMMOGRAM  08/17/2021  . PAP SMEAR-Modifier  05/10/2023  . TETANUS/TDAP  10/10/2026  . COVID-19 Vaccine  Completed  . Hepatitis C Screening  Completed  . HIV Screening  Completed    Objective:  Vitals:   08/16/20 0848  Pulse: 80  SpO2: 97%  Weight: 186 lb (84.4 kg)   Body mass index is 34.02 kg/m.  Physical Exam Constitutional:      Comments: She is talkative and in no obvious distress.  Her weight is up 10 pounds in the last 3 months.  Eyes:     Conjunctiva/sclera: Conjunctivae normal.  Cardiovascular:     Rate and Rhythm: Normal rate and regular rhythm.     Heart sounds: No murmur heard.   Pulmonary:     Effort: Pulmonary effort is normal.     Breath sounds: Normal breath sounds.  Abdominal:     Palpations: Abdomen is soft.     Tenderness: There is no abdominal tenderness.  Musculoskeletal:        General: No swelling or tenderness.  Skin:    Findings: No rash.  Neurological:     General: No focal deficit present.  Psychiatric:        Mood and Affect: Mood normal.     Lab Results Lab Results  Component Value Date   WBC 5.3 05/16/2020   HGB 11.6 (L) 05/16/2020   HCT 36.7 05/16/2020   MCV 88.2 05/16/2020   PLT 240 05/16/2020    Lab Results  Component Value Date   CREATININE 1.10 (H) 05/16/2020   BUN 14 05/16/2020   NA 140  05/16/2020   K 4.1 05/16/2020   CL 105 05/16/2020   CO2 28 05/16/2020    Lab Results  Component Value Date   ALT 14 05/16/2020   AST 18 05/16/2020   ALKPHOS 81 12/29/2019   BILITOT 0.7 05/16/2020    Lab Results  Component Value Date   CHOL 160 12/29/2019   HDL 34.40 (L) 12/29/2019   LDLCALC 98 12/29/2019   LDLDIRECT 102.1 03/02/2009   TRIG 135.0 12/29/2019   CHOLHDL 5 12/29/2019   Lab Results  Component Value  Date   LABRPR NON-REACTIVE 12/29/2019   HIV 1 RNA Quant (copies/mL)  Date Value  02/22/2020 <20 DETECTED (A)  01/25/2020 2,490 (H)  12/29/2019 4,320 (H)   CD4 T Cell Abs (/uL)  Date Value  02/22/2020 242 (L)  01/25/2020 148 (L)  01/07/2019 230 (L)     Problem List Items Addressed This Visit      High   Human immunodeficiency virus (HIV) disease ([place])    Her HIV infection came under good control after restarting Biktarvy earlier this year and she has had some CD4 reconstitution.  Bridget has had a great deal of difficulty tolerating all antiretroviral regimens she has been on over the past several decades.  She has a strong belief that medications will cause her side effects.  She has cycled on and off therapy.  I told her that taking half the dose is extremely dangerous and could promote resistance.  She is interested in stopping therapy now to see if she improves.  I will check blood work today and see her back next month.      Relevant Orders   T-helper cell (CD4)- ([facility] clinic only)   HIV-1 RNA quant-no reflex-bld     Unprioritized   Insomnia    I believe that her insomnia is probably related to her depression and work stress rather than [employer].      Depression    I believe that a lot if not all of her current symptoms could be related to worsening depression with superimposed anxiety.  She is interested in meeting with our behavioral health counselor.           Michel Bickers, MD [facility] [place]  [facility] [phone] pager   [phone] cell 08/16/2020, 1:40 PM

## 2020-08-17 ENCOUNTER — Ambulatory Visit: Payer: BC Managed Care – PPO

## 2020-08-17 LAB — T-HELPER CELL (CD4) - (RCID CLINIC ONLY)
CD4 % Helper T Cell: 8 % — ABNORMAL LOW (ref 33–65)
CD4 T Cell Abs: 171 /uL — ABNORMAL LOW (ref 400–1790)

## 2020-08-19 LAB — HIV-1 RNA QUANT-NO REFLEX-BLD
HIV 1 RNA Quant: 36 Copies/mL — ABNORMAL HIGH
HIV-1 RNA Quant, Log: 1.56 Log cps/mL — ABNORMAL HIGH

## 2020-08-22 ENCOUNTER — Ambulatory Visit: Payer: BC Managed Care – PPO

## 2020-08-22 ENCOUNTER — Other Ambulatory Visit: Payer: Self-pay

## 2020-08-22 DIAGNOSIS — F332 Major depressive disorder, recurrent severe without psychotic features: Secondary | ICD-10-CM

## 2020-08-22 NOTE — Progress Notes (Signed)
Client was alert, oriented x3, with no SI, HI, or symptoms of psychosis (risk low).  Client was pleasant and friendly, engaging openly and appropriately with therapist, benefiting from supportive listening and exploration of feelings.  Next session recommended in one to two weeks.   Therapist used rapport building techniques to introduce and discuss the therapeutic relationship. Therapist and client reviewed her CCA and her presenting problems, discussed treatment goals, and identified past barriers to treatment and to functionality. Therapist attempted to develop rapport via asking open-ended questions regarding client's psychosocial history.

## 2020-08-31 ENCOUNTER — Other Ambulatory Visit: Payer: Self-pay

## 2020-08-31 ENCOUNTER — Ambulatory Visit: Payer: BC Managed Care – PPO

## 2020-09-04 ENCOUNTER — Other Ambulatory Visit: Payer: BC Managed Care – PPO

## 2020-09-21 ENCOUNTER — Other Ambulatory Visit: Payer: Self-pay

## 2020-09-21 ENCOUNTER — Encounter: Payer: Self-pay | Admitting: Internal Medicine

## 2020-09-21 ENCOUNTER — Ambulatory Visit (INDEPENDENT_AMBULATORY_CARE_PROVIDER_SITE_OTHER): Payer: BC Managed Care – PPO | Admitting: Internal Medicine

## 2020-09-21 VITALS — BP 154/94 | HR 77 | Temp 99.3°F | Ht 62.0 in | Wt 181.0 lb

## 2020-09-21 DIAGNOSIS — B20 Human immunodeficiency virus [HIV] disease: Secondary | ICD-10-CM

## 2020-09-21 NOTE — Progress Notes (Signed)
   Subjective:   Patient ID: Stephanie Espinoza, female    DOB: [birth year], 64 y.o.   MRN: [id number]  HPI The patient is a 64 YO female coming in for follow up of her HIV. She has decided to stop biktarvy as it was causing her to not feel well. She did have detectable viral load prior to stopping this medication. She will continue for now with ID for follow up. She has tried many agents in the past with side effects typically that make her not want to take any agents for too long. She did have partial recovery of her low CD4 count while on therapy this year   Review of Systems  Constitutional: Negative.   HENT: Positive for congestion and ear pain.   Eyes: Negative.   Respiratory: Negative for cough, chest tightness and shortness of breath.   Cardiovascular: Negative for chest pain, palpitations and leg swelling.  Gastrointestinal: Negative for abdominal distention, abdominal pain, constipation, diarrhea, nausea and vomiting.  Musculoskeletal: Negative.   Skin: Negative.   Neurological: Negative.   Psychiatric/Behavioral: Positive for decreased concentration, dysphoric mood and sleep disturbance.    Objective:  Physical Exam Constitutional:      Appearance: She is well-developed.  HENT:     Head: Normocephalic and atraumatic.  Cardiovascular:     Rate and Rhythm: Normal rate and regular rhythm.  Pulmonary:     Effort: Pulmonary effort is normal. No respiratory distress.     Breath sounds: Normal breath sounds. No wheezing or rales.  Abdominal:     General: Bowel sounds are normal. There is no distension.     Palpations: Abdomen is soft.     Tenderness: There is no abdominal tenderness. There is no rebound.  Musculoskeletal:     Cervical back: Normal range of motion.  Skin:    General: Skin is warm and dry.  Neurological:     Mental Status: She is alert and oriented to person, place, and time.     Coordination: Coordination normal.     Vitals:   09/21/20 1413  BP: (!) 154/94   Pulse: 77  Temp: 99.3 F (37.4 C)  TempSrc: Oral  SpO2: 96%  Weight: 181 lb (82.1 kg)  Height: 5\' 2"  (1.575 m)    This visit occurred during the SARS-CoV-2 public health emergency.  Safety protocols were in place, including screening questions prior to the visit, additional usage of staff PPE, and extensive cleaning of exam room while observing appropriate contact time as indicated for disinfecting solutions.   Assessment & Plan:  Visit time 20 minutes in face to face communication with patient and coordination of care, additional 5 minutes spent in record review, coordination or care, ordering tests, communicating/referring to other healthcare professionals, documenting in medical records all on the same day of the visit for total time 25 minutes spent on the visit.

## 2020-09-21 NOTE — Patient Instructions (Addendum)
Vaccines.gov to find the J and J vaccine to [employer].

## 2020-09-22 NOTE — Assessment & Plan Note (Signed)
She is aware of recommendation for HAART, declines at this time. Will need monitoring of her CD4 count and viral load with either [place] or ID. She will let us know based on those results if she wants to try another in the future.

## 2020-09-26 ENCOUNTER — Ambulatory Visit (INDEPENDENT_AMBULATORY_CARE_PROVIDER_SITE_OTHER): Payer: BC Managed Care – PPO | Admitting: Internal Medicine

## 2020-09-26 ENCOUNTER — Encounter: Payer: Self-pay | Admitting: Internal Medicine

## 2020-09-26 ENCOUNTER — Other Ambulatory Visit: Payer: Self-pay

## 2020-09-26 VITALS — BP 188/103 | HR 65 | Temp 97.7°F | Wt 181.0 lb

## 2020-09-26 DIAGNOSIS — B20 Human immunodeficiency virus [HIV] disease: Secondary | ICD-10-CM

## 2020-09-26 DIAGNOSIS — Z23 Encounter for immunization: Secondary | ICD-10-CM

## 2020-09-26 MED ORDER — SULFAMETHOXAZOLE-TRIMETHOPRIM 800-160 MG PO TABS: 1.0000 | ORAL_TABLET | Freq: Every day | ORAL | 11 refills | Status: DC

## 2020-09-26 NOTE — Assessment & Plan Note (Signed)
Stephanie Espinoza has had a great deal of difficulty tolerating multiple different antiretroviral regimens over the last several decades.  About the possibility of long-acting injectable antiretroviral since she has some interest.  Had her meet with our pharmacy team to learn more about this.  Her CD4 count is dropped below 200 again.  I will start her back on pneumocystis prophylaxis and see her back in 4 to 6 weeks.  She recently received her Ranelle Oyster Covid booster.  She received her influenza vaccine here today.

## 2020-09-26 NOTE — Progress Notes (Signed)
Patient Active Problem List   Diagnosis Date Noted  . Human immunodeficiency virus (HIV) disease ([place]) 09/06/2006    Priority: High  . GENITAL HERPES 09/06/2006    Priority: Medium  . Urinary incontinence 05/16/2020  . Other fatigue 05/16/2020  . History of syphilis 05/14/2020  . Memory loss 02/14/2020  . Insomnia 02/14/2020  . Herpes zoster 12/31/2019  . Normocytic anemia 03/01/2019  . Hyperglycemia 03/01/2019  . Obesity (BMI 30.0-34.9) 03/01/2019  . Headache 06/30/2018  . Urinary frequency 05/01/2018  . Sciatica 05/26/2017  . Cough 11/05/2016  . 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 Low back pain 06/12/2016  . Essential hypertension 09/16/2014  . 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 Sinusitis 03/22/2009  . Depression 09/06/2006  . PAP SMEAR, ABNORMAL 09/06/2006    Patient's Medications  New Prescriptions   SULFAMETHOXAZOLE-TRIMETHOPRIM (BACTRIM DS) 800-160 MG TABLET    Take 1 tablet by mouth daily.  Previous Medications   ACYCLOVIR OINTMENT (ZOVIRAX) 5 %    Apply topically every 3 (three) hours.   ALBUTEROL (VENTOLIN HFA) 108 (90 BASE) MCG/ACT INHALER    Inhale 2 puffs into the lungs every 6 (six) hours as needed for wheezing or shortness of breath.   FLUTICASONE (FLONASE) 50 MCG/ACT NASAL SPRAY    Place 2 sprays into both nostrils daily. NEED OFFICE VISIT FOR FURTHER REFILLS   VALACYCLOVIR (VALTREX) 500 MG TABLET    1 po q day and increase to bid x 3 days with symptoms.   VITAMIN D, ERGOCALCIFEROL, (DRISDOL) 1.25 MG (50000 UNIT) CAPS CAPSULE    TAKE 1 CAPSULE (50,000 UNITS TOTAL) BY MOUTH EVERY 7 (SEVEN) DAYS.  Modified Medications   No medications on file  Discontinued Medications   No medications on file    Subjective: Antoinetta is in for her routine HIV follow-up visit.  She says that she is feeling better since stopping Biktarvy.  She says she is not feeling as fatigued and no longer having night sweats.  She says that  she is not feeling as depressed.  She is looking forward to having time off of work during the upcoming holidays.  Review of Systems: Review of Systems  Constitutional: Negative for chills, diaphoresis, fever and weight loss.    Past Medical History:  Diagnosis Date  . Abnormal Pap smear of cervix before 1995   always a repeat back to normal except for 1 time and had colpo bioosy -resutls were normal  . Allergic rhinitis   . Anxiety   . Bell's palsy after 1995 ?   no residual SE.  [name] Depression   . Genital herpes   . History of drug abuse ([place])    drug usser [place].  cocaine  . HIV (human immunodeficiency virus infection) ([place]) 1990   from sexual contact who is unknown  . Syphilis    treated    Social History   Tobacco Use  . Smoking status: Former Smoker    Types: Cigarettes    Quit date: 03/01/1990    Years since quitting: 30.5  . Smokeless tobacco: Never Used  Vaping Use  . Vaping Use: Never used  Substance Use Topics  . Alcohol use: No  . Drug use: No    Family History  Problem Relation Age of Onset  . Diabetes Father   . Breast cancer Other     Allergies  Allergen Reactions  . Haldol [Haloperidol] Other (See Comments)  Wipes out her memory  . Nevirapine     REACTION: Rash 10/08  . Thorazine [Chlorpromazine] Other (See Comments)    "makes me comatose"    Health Maintenance  Topic Date Due  . INFLUENZA VACCINE  02/01/2021 (Originally 06/04/2020)  . Fecal DNA (Cologuard)  02/07/2021  . MAMMOGRAM  08/17/2021  . PAP SMEAR-Modifier  05/10/2023  . TETANUS/TDAP  10/10/2026  . COVID-19 Vaccine  Completed  . Hepatitis C Screening  Completed  . HIV Screening  Completed    Objective:  Vitals:   09/26/20 1342  BP: (!) 188/103  Pulse: 65  Temp: 97.7 F (36.5 C)  TempSrc: Oral  Weight: 181 lb (82.1 kg)   Body mass index is 33.11 kg/m.  Physical Exam Constitutional:      Comments: She seems to be in better spirits today.  Cardiovascular:      Rate and Rhythm: Normal rate.  Pulmonary:     Effort: Pulmonary effort is normal.  Neurological:     Comments: Normal affect.     Lab Results Lab Results  Component Value Date   WBC 5.3 05/16/2020   HGB 11.6 (L) 05/16/2020   HCT 36.7 05/16/2020   MCV 88.2 05/16/2020   PLT 240 05/16/2020    Lab Results  Component Value Date   CREATININE 1.10 (H) 05/16/2020   BUN 14 05/16/2020   NA 140 05/16/2020   K 4.1 05/16/2020   CL 105 05/16/2020   CO2 28 05/16/2020    Lab Results  Component Value Date   ALT 14 05/16/2020   AST 18 05/16/2020   ALKPHOS 81 12/29/2019   BILITOT 0.7 05/16/2020    Lab Results  Component Value Date   CHOL 160 12/29/2019   HDL 34.40 (L) 12/29/2019   LDLCALC 98 12/29/2019   LDLDIRECT 102.1 03/02/2009   TRIG 135.0 12/29/2019   CHOLHDL 5 12/29/2019   Lab Results  Component Value Date   LABRPR NON-REACTIVE 12/29/2019   HIV 1 RNA Quant  Date Value  08/16/2020 36 Copies/mL (H)  02/22/2020 <20 DETECTED copies/mL (A)  01/25/2020 2,490 copies/mL (H)   CD4 T Cell Abs (/uL)  Date Value  08/16/2020 171 (L)  02/22/2020 242 (L)  01/25/2020 148 (L)     Problem List Items Addressed This Visit      High   Human immunodeficiency virus (HIV) disease ([place])    Tifanie has had a great deal of difficulty tolerating multiple different antiretroviral regimens over the last several decades.  About the possibility of long-acting injectable antiretroviral since she has some interest.  Had her meet with our pharmacy team to learn more about this.  Her CD4 count is dropped below 200 again.  I will start her back on pneumocystis prophylaxis and see her back in 4 to 6 weeks.  She recently received her Ranelle Oyster Covid booster.  She received her influenza vaccine here today.      Relevant Medications   sulfamethoxazole-trimethoprim (BACTRIM DS) 800-160 MG tablet        Michel Bickers, MD [facility] for [place] [phone]  pager   [phone] cell 09/26/2020, 2:02 PM

## 2020-10-14 ENCOUNTER — Other Ambulatory Visit: Payer: Self-pay | Admitting: Internal Medicine

## 2020-10-19 ENCOUNTER — Encounter: Payer: Self-pay | Admitting: Internal Medicine

## 2020-10-31 ENCOUNTER — Encounter: Payer: Self-pay | Admitting: Internal Medicine

## 2020-10-31 ENCOUNTER — Telehealth: Payer: Self-pay | Admitting: Internal Medicine

## 2020-10-31 NOTE — Telephone Encounter (Signed)
Please advise in PCP's absence. Thanks! 

## 2020-10-31 NOTE — Telephone Encounter (Signed)
Patient called and said she took an at home Covid 19 test 12.28.21 and it came back positive. She said she is having body aches, weakness, runny nose, sore throat. She was wondering if there was anything that she needed to do. She can be reached at [phone].

## 2020-11-02 NOTE — Telephone Encounter (Signed)
She can take zinc 50 mg a day for 1 week, vitamin C 1000 mg daily for 1 week, vitamin D2 50,000 units weekly for 2 months ,Quercetin 500 mg twice a day for 1 week.  Maintain good oral hydration and take Tylenol with high fever.  If you get short of breath or sick call [place] COVID-19 hotline at [phone] [facility]). Appointments required.  Thx

## 2020-11-02 NOTE — Telephone Encounter (Signed)
Pt informed of below.  

## 2020-11-09 ENCOUNTER — Other Ambulatory Visit: Payer: Self-pay

## 2020-11-09 ENCOUNTER — Encounter: Payer: Self-pay | Admitting: Internal Medicine

## 2020-11-09 ENCOUNTER — Telehealth (INDEPENDENT_AMBULATORY_CARE_PROVIDER_SITE_OTHER): Payer: BC Managed Care – PPO | Admitting: Internal Medicine

## 2020-11-09 DIAGNOSIS — U071 COVID-19: Secondary | ICD-10-CM | POA: Insufficient documentation

## 2020-11-09 NOTE — Assessment & Plan Note (Signed)
She is considered immune compromised given her low CD4 count. She is vaccinated against covid-19. Advised to complete the azithromycin and prednisone. If worsening SOB go back to urgent care or ER.

## 2020-11-09 NOTE — Progress Notes (Signed)
Virtual Visit via Video Note  I connected with Stephanie Espinoza on 11/09/20 at  2:00 PM EST by a video enabled telemedicine application and verified that I am speaking with the correct person using two identifiers.  The patient and the provider were at separate locations throughout the entire encounter. Patient location: home, Provider location: work   I discussed the limitations of evaluation and management by telemedicine and the availability of in person appointments. The patient expressed understanding and agreed to proceed. The patient and the provider were the only parties present for the visit unless noted in HPI below.  History of Present Illness: The patient is a 65 y.o. female with visit for cough and fatigue. Tested positive for covid-19 on 10/31/20. Is vaccinated against covid-19. Does have concurrent HIV not on HAART. Last CD4 170, she is on bactrim for PCP ppx. Has some chest tightness and congestion and SOB still. Seen at urgent care this morning and they gave her rx for azithromycin and prednisone which she started taking today. Has not seen any benefit yet. Has a lot of fatigue and loss of appetite. Overall it is not improving. Daughter exposed her to covid-19 over Christmas. Started z-pack and prednisone  Observations/Objective: Appearance: normal, breathing appears normal, minimal coughing during visit, speaking in full sentences, casual grooming, abdomen does not appear distended, throat not well visualized, memory normal, mental status is A and O times 3  Assessment and Plan: See problem oriented charting  Follow Up Instructions: finish the azithromycin and prednisone and reach out if not improving  I discussed the assessment and treatment plan with the patient. The patient was provided an opportunity to ask questions and all were answered. The patient agreed with the plan and demonstrated an understanding of the instructions.   The patient was advised to call back or seek an  in-person evaluation if the symptoms worsen or if the condition fails to improve as anticipated.  Myrlene Broker, MD

## 2020-11-11 ENCOUNTER — Encounter: Payer: Self-pay | Admitting: Internal Medicine

## 2020-12-05 ENCOUNTER — Encounter: Payer: Self-pay | Admitting: Internal Medicine

## 2020-12-12 ENCOUNTER — Ambulatory Visit: Payer: BC Managed Care – PPO

## 2020-12-12 ENCOUNTER — Other Ambulatory Visit: Payer: Self-pay

## 2020-12-12 NOTE — Progress Notes (Unsigned)
Mental Health Therapist Progress Note   Name: Stephanie Espinoza  Total time: 30  Type of Service: Individual Outpatient Mental Health Therapy  OBJECTIVE:  Mood: Negative and Euthymic and Affect: Appropriate Risk of harm to self or others: No plan to harm self or others  DIAGNOSIS:   GOALS ADDRESSED:  Patient will: 1.  Reduce symptoms of: stress  2.  Increase knowledge and/or ability of: stress reduction  3.  Demonstrate ability to: Increase healthy adjustment to current life circumstances  INTERVENTIONS: Interventions utilized:  Supportive Counseling Therapist met with patient for outpatient mental health individual therapy to include ongoing assessment, support, and reinforcement.  Therapist allowed patient to "check in" since previous session; asking patient to share any positive coping skills they may have used over the previous week, along with any challenges faced.  Therapist provided supportive listening as patient processed their thoughts, emotional responses, and behaviors surrounding several stressors.  EFFECTIVENESS/PLAN: Client was alert, oriented x3, with no SI, HI, or symptoms of psychosis (risk low).  Client was pleasant and friendly, engaging openly and appropriately with therapist, benefiting from supportive listening and exploration of feelings.  Next session recommended in one to two weeks.   Hughes Better, LCSW

## 2020-12-14 ENCOUNTER — Encounter: Payer: Self-pay | Admitting: Internal Medicine

## 2020-12-14 ENCOUNTER — Other Ambulatory Visit: Payer: Self-pay

## 2020-12-14 ENCOUNTER — Ambulatory Visit (INDEPENDENT_AMBULATORY_CARE_PROVIDER_SITE_OTHER): Payer: BC Managed Care – PPO | Admitting: Internal Medicine

## 2020-12-14 DIAGNOSIS — F331 Major depressive disorder, recurrent, moderate: Secondary | ICD-10-CM

## 2020-12-14 DIAGNOSIS — B20 Human immunodeficiency virus [HIV] disease: Secondary | ICD-10-CM

## 2020-12-14 DIAGNOSIS — U071 COVID-19: Secondary | ICD-10-CM | POA: Diagnosis not present

## 2020-12-14 NOTE — Assessment & Plan Note (Signed)
Her chronic depression and anxiety are acutely worsened by job related stress.  I had a very lengthy discussion with her and asked her to focus on realistic options regarding her job.  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  I also encouraged her to try to get some regular exercise and find other positive activities to help relieve job-related stress.

## 2020-12-14 NOTE — Assessment & Plan Note (Signed)
Based on her account she had recent breakthrough COVID infection 6 weeks ago.  She has been fully vaccinated with the [place] vaccine including a booster dose.  She still has some residual cough and shortness of breath but seems to be convalescing fairly uneventfully.

## 2020-12-14 NOTE — Assessment & Plan Note (Signed)
Stephanie Espinoza's last two viral loads last year were suppressed even though she was not taking Biktarvy correctly.  She has never been able to tolerate or consistently take any antiretroviral regimen over the past two decades.  She has a mental construct that she will have difficulty tolerating any oral regimen although she tolerates other oral medications reasonably well.  I had her meet with our pharmacy staff during her last visit to discuss the possibility of injectable Cabenuva.  It is now approved for dosing every other month which may make it feasible for her.  She will get repeat lab work today and follow-up in 2 weeks.

## 2020-12-14 NOTE — Progress Notes (Signed)
Patient Active Problem List   Diagnosis Date Noted  . Human immunodeficiency virus (HIV) disease ([place]) 09/06/2006    Priority: High  . GENITAL HERPES 09/06/2006    Priority: Medium  . COVID 11/09/2020  . Urinary incontinence 05/16/2020  . Other fatigue 05/16/2020  . History of syphilis 05/14/2020  . Memory loss 02/14/2020  . Insomnia 02/14/2020  . Herpes zoster 12/31/2019  . Normocytic anemia 03/01/2019  . Hyperglycemia 03/01/2019  . Obesity (BMI 30.0-34.9) 03/01/2019  . Headache 06/30/2018  . Urinary frequency 05/01/2018  . Sciatica 05/26/2017  . Cough 11/05/2016  . 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 Low back pain 06/12/2016  . Essential hypertension 09/16/2014  . 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 Sinusitis 03/22/2009  . Depression 09/06/2006  . PAP SMEAR, ABNORMAL 09/06/2006    Patient's Medications  New Prescriptions   No medications on file  Previous Medications   ACYCLOVIR OINTMENT (ZOVIRAX) 5 %    Apply topically every 3 (three) hours.   ALBUTEROL (VENTOLIN HFA) 108 (90 BASE) MCG/ACT INHALER    Inhale 2 puffs into the lungs every 6 (six) hours as needed for wheezing or shortness of breath.   FLUTICASONE (FLONASE) 50 MCG/ACT NASAL SPRAY    Place 2 sprays into both nostrils daily. NEED OFFICE VISIT FOR FURTHER REFILLS   SULFAMETHOXAZOLE-TRIMETHOPRIM (BACTRIM DS) 800-160 MG TABLET    Take 1 tablet by mouth daily.   VALACYCLOVIR (VALTREX) 500 MG TABLET    1 po q day and increase to bid x 3 days with symptoms.   VITAMIN D, ERGOCALCIFEROL, (DRISDOL) 1.25 MG (50000 UNIT) CAPS CAPSULE    TAKE 1 CAPSULE (50,000 UNITS TOTAL) BY MOUTH EVERY 7 (SEVEN) DAYS.  Modified Medications   No medications on file  Discontinued Medications   No medications on file    Subjective: Stephanie Espinoza is in for her routine HIV follow-up visit.  I have followed [name] for several decades for her HIV infection.  Over the years she has been on many  different antiretroviral regimens but has failed to tolerate any of them.  She has cycled on and off therapy and in and out of care frequently.  She has rarely stayed on an antiretroviral regimen for very long.  When I last saw her in October, 4 months ago she had decreased her Biktarvy dose to see if she would feel better.  I instructed her to stop taking Biktarvy and she is actually feeling worse over the past several months.  I prescribed oral trimethoprim sulfamethoxazole given that her CD4 count has now dropped below 200 but she tells me that she only took it when she felt like she needed it.  She says that she diagnosed herself with Covid with a home test kit in late December.  She took Trimethoprim/Sulfamethoxazole (as well as prescribed azithromycin and prednisone) for short period of time after her Covid was diagnosed.  She continues to describe a great deal of work related stress.  She says that she applied for and was approved for FMLA last August and it was again recertified last December.  However she has continued to work because of pressure from her supervisor.  She says that she has had difficulty with to subordinates.  One recently quit and she had to take on that person's responsibilities in addition to her own.  A second subordinate has been out frequently because of pregnancy related issues, putting more work on  her plate.  She says that she has spoken to her supervisor and human resources on many occasions but does not feel like they are willing or able to help her.  She has investigated whether or not there is another department she can change to so she does not report to the same supervisor but no other jobs are currently available to her.  She does not want to quit her job because of her medical and paycheck benefits.  She met with our behavioral health counselor 2 days ago but tells me that she does not expect that it will help her to talk about this with a counselor.  After lengthy  discussion with her about her current situation and what her options are she was finally able to admit that she is going to have to make a decision about her work and that no one else can solve her current problem.  She is frustrated because she would like to be able to not work now but she is unwilling to leave work because of her benefits.  Review of Systems: Review of Systems  Constitutional: Positive for diaphoresis and malaise/fatigue. Negative for chills, fever and weight loss.  Respiratory: Positive for cough and shortness of breath. Negative for sputum production and wheezing.   Cardiovascular: Negative for chest pain.  Gastrointestinal: Positive for diarrhea and nausea. Negative for abdominal pain and vomiting.  Genitourinary: Negative for dysuria.       She has had increasing problems with urinary incontinence.  Musculoskeletal: Positive for back pain, joint pain and myalgias.  Skin: Negative for rash.  Neurological: Positive for dizziness and headaches.  Psychiatric/Behavioral: Positive for depression. The patient is nervous/anxious and has insomnia.     Past Medical History:  Diagnosis Date  . Abnormal Pap smear of cervix before 1995   always a repeat back to normal except for 1 time and had colpo bioosy -resutls were normal  . Allergic rhinitis   . Anxiety   . Bell's palsy after 1995 ?   no residual SE.  [name] Depression   . Genital herpes   . History of drug abuse ([place])    drug usser [place].  cocaine  . HIV (human immunodeficiency virus infection) ([place]) 1990   from sexual contact who is unknown  . Syphilis    treated    Social History   Tobacco Use  . Smoking status: Former Smoker    Types: Cigarettes    Quit date: 03/01/1990    Years since quitting: 30.8  . Smokeless tobacco: Never Used  Vaping Use  . Vaping Use: Never used  Substance Use Topics  . Alcohol use: No  . Drug use: No    Family History  Problem Relation Age of Onset  . Diabetes Father   .  Breast cancer Other     Allergies  Allergen Reactions  . Haldol [Haloperidol] Other (See Comments)    Wipes out her memory  . Nevirapine     REACTION: Rash 10/08  . Thorazine [Chlorpromazine] Other (See Comments)    "makes me comatose"    Health Maintenance  Topic Date Due  . COVID-19 Vaccine (2 - Booster for [employer] series) 03/30/2020  . Fecal DNA (Cologuard)  02/07/2021  . MAMMOGRAM  08/17/2021  . PAP SMEAR-Modifier  05/10/2023  . TETANUS/TDAP  10/10/2026  . INFLUENZA VACCINE  Completed  . Hepatitis C Screening  Completed  . HIV Screening  Completed    Objective:  Vitals:   12/14/20  1104  BP: (!) 180/95  Pulse: 78  Temp: 98 F (36.7 C)  Weight: 174 lb (78.9 kg)  Height: 5' 2"  (1.575 m)   Body mass index is 31.83 kg/m.  Physical Exam Constitutional:      Comments: She is in good spirits.  She is very talkative as usual.  Cardiovascular:     Rate and Rhythm: Normal rate and regular rhythm.     Heart sounds: No murmur heard.   Pulmonary:     Effort: Pulmonary effort is normal.     Breath sounds: Normal breath sounds.  Abdominal:     Palpations: Abdomen is soft.     Tenderness: There is no abdominal tenderness.  Musculoskeletal:        General: No swelling or tenderness.  Skin:    Findings: No rash.  Neurological:     General: No focal deficit present.  Psychiatric:        Mood and Affect: Mood normal.     Lab Results Lab Results  Component Value Date   WBC 5.3 05/16/2020   HGB 11.6 (L) 05/16/2020   HCT 36.7 05/16/2020   MCV 88.2 05/16/2020   PLT 240 05/16/2020    Lab Results  Component Value Date   CREATININE 1.10 (H) 05/16/2020   BUN 14 05/16/2020   NA 140 05/16/2020   K 4.1 05/16/2020   CL 105 05/16/2020   CO2 28 05/16/2020    Lab Results  Component Value Date   ALT 14 05/16/2020   AST 18 05/16/2020   ALKPHOS 81 12/29/2019   BILITOT 0.7 05/16/2020    Lab Results  Component Value Date   CHOL 160 12/29/2019   HDL 34.40 (L)  12/29/2019   LDLCALC 98 12/29/2019   LDLDIRECT 102.1 03/02/2009   TRIG 135.0 12/29/2019   CHOLHDL 5 12/29/2019   Lab Results  Component Value Date   LABRPR NON-REACTIVE 12/29/2019   HIV 1 RNA Quant  Date Value  08/16/2020 36 Copies/mL (H)  02/22/2020 <20 DETECTED copies/mL (A)  01/25/2020 2,490 copies/mL (H)   CD4 T Cell Abs (/uL)  Date Value  08/16/2020 171 (L)  02/22/2020 242 (L)  01/25/2020 148 (L)     Problem List Items Addressed This Visit      High   Human immunodeficiency virus (HIV) disease ([place])    Yesly's last two viral loads last year were suppressed even though she was not taking Biktarvy correctly.  She has never been able to tolerate or consistently take any antiretroviral regimen over the past two decades.  She has a mental construct that she will have difficulty tolerating any oral regimen although she tolerates other oral medications reasonably well.  I had her meet with our pharmacy staff during her last visit to discuss the possibility of injectable Cabenuva.  It is now approved for dosing every other month which may make it feasible for her.  She will get repeat lab work today and follow-up in 2 weeks.      Relevant Orders   T-helper cell (CD4)- ([facility] clinic only)   HIV-1 RNA quant-no reflex-bld     Unprioritized   Depression    Her chronic depression and anxiety are acutely worsened by job related stress.  I had a very lengthy discussion with her and asked her to focus on realistic options regarding her job.  I also asked her to focus on things that she had under her control rather than focusing on changing the behavior of her subordinate  and supervisor.  I also encouraged her to try to get some regular exercise and find other positive activities to help relieve job-related stress.      COVID    Based on her account she had recent breakthrough COVID infection 6 weeks ago.  She has been fully vaccinated with the [place] vaccine including a booster dose.  She  still has some residual cough and shortness of breath but seems to be convalescing fairly uneventfully.           Michel Bickers, MD [facility] for Infectious [place] [facility] [phone] pager   [phone] cell 12/14/2020, 1:46 PM

## 2020-12-15 LAB — T-HELPER CELL (CD4) - (RCID CLINIC ONLY)
CD4 % Helper T Cell: 7 % — ABNORMAL LOW (ref 33–65)
CD4 T Cell Abs: 169 /uL — ABNORMAL LOW (ref 400–1790)

## 2020-12-16 LAB — HIV-1 RNA QUANT-NO REFLEX-BLD
HIV 1 RNA Quant: 10600 Copies/mL — ABNORMAL HIGH
HIV-1 RNA Quant, Log: 4.03 Log cps/mL — ABNORMAL HIGH

## 2020-12-23 ENCOUNTER — Other Ambulatory Visit: Payer: Self-pay | Admitting: Obstetrics & Gynecology

## 2020-12-26 ENCOUNTER — Other Ambulatory Visit: Payer: Self-pay

## 2020-12-26 ENCOUNTER — Encounter: Payer: Self-pay | Admitting: Internal Medicine

## 2020-12-26 ENCOUNTER — Other Ambulatory Visit: Payer: Self-pay | Admitting: Internal Medicine

## 2020-12-26 ENCOUNTER — Telehealth: Payer: Self-pay

## 2020-12-26 ENCOUNTER — Ambulatory Visit: Payer: BC Managed Care – PPO

## 2020-12-26 ENCOUNTER — Ambulatory Visit (INDEPENDENT_AMBULATORY_CARE_PROVIDER_SITE_OTHER): Payer: BC Managed Care – PPO | Admitting: Internal Medicine

## 2020-12-26 DIAGNOSIS — B20 Human immunodeficiency virus [HIV] disease: Secondary | ICD-10-CM

## 2020-12-26 MED ORDER — BICTEGRAVIR-EMTRICITAB-TENOFOV 50-200-25 MG PO TABS: 1.0000 | ORAL_TABLET | Freq: Every day | ORAL | 11 refills | Status: DC

## 2020-12-26 NOTE — Telephone Encounter (Signed)
Per Butch Penny, patient's Biktarvy will need to be filled at [facility] in [facility], [place]. RN spoke with patient and gave her specialty pharmacy phone number [phone]) to set up delivery. Advised patient to call us with any concerns. Patient verbalized understanding and has no further questions.   Beryle Flock, RN

## 2020-12-26 NOTE — Progress Notes (Signed)
Patient Active Problem List   Diagnosis Date Noted  . Human immunodeficiency virus (HIV) disease ([place]) 09/06/2006    Priority: High  . GENITAL HERPES 09/06/2006    Priority: Medium  . COVID 11/09/2020  . Urinary incontinence 05/16/2020  . Other fatigue 05/16/2020  . History of syphilis 05/14/2020  . Memory loss 02/14/2020  . Insomnia 02/14/2020  . Herpes zoster 12/31/2019  . Normocytic anemia 03/01/2019  . Hyperglycemia 03/01/2019  . Obesity (BMI 30.0-34.9) 03/01/2019  . Headache 06/30/2018  . Urinary frequency 05/01/2018  . Sciatica 05/26/2017  . Cough 11/05/2016  . 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 Low back pain 06/12/2016  . Essential hypertension 09/16/2014  . 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 Sinusitis 03/22/2009  . Depression 09/06/2006  . PAP SMEAR, ABNORMAL 09/06/2006    Patient's Medications  New Prescriptions   BICTEGRAVIR-EMTRICITABINE-TENOFOVIR AF (BIKTARVY) 50-200-25 MG TABS TABLET    Take 1 tablet by mouth daily.  Previous Medications   ACYCLOVIR OINTMENT (ZOVIRAX) 5 %    Apply topically every 3 (three) hours.   ALBUTEROL (VENTOLIN HFA) 108 (90 BASE) MCG/ACT INHALER    Inhale 2 puffs into the lungs every 6 (six) hours as needed for wheezing or shortness of breath.   FLUTICASONE (FLONASE) 50 MCG/ACT NASAL SPRAY    Place 2 sprays into both nostrils daily. NEED OFFICE VISIT FOR FURTHER REFILLS   SULFAMETHOXAZOLE-TRIMETHOPRIM (BACTRIM DS) 800-160 MG TABLET    Take 1 tablet by mouth daily.   VALACYCLOVIR (VALTREX) 500 MG TABLET    1 po q day and increase to bid x 3 days with symptoms.   VITAMIN D, ERGOCALCIFEROL, (DRISDOL) 1.25 MG (50000 UNIT) CAPS CAPSULE    TAKE 1 CAPSULE (50,000 UNITS TOTAL) BY MOUTH EVERY 7 (SEVEN) DAYS.  Modified Medications   No medications on file  Discontinued Medications   No medications on file    Subjective: Stephanie Espinoza is in for her routine HIV follow-up visit.  She did restart  Trimethoprim/Sulfamethoxazole after her recent visit.  She has not had any problems tolerating it.  Review of Systems: Review of Systems  Constitutional: Negative for fever.    Past Medical History:  Diagnosis Date  . Abnormal Pap smear of cervix before 1995   always a repeat back to normal except for 1 time and had colpo bioosy -resutls were normal  . Allergic rhinitis   . Anxiety   . Bell's palsy after 1995 ?   no residual SE.  [name] Depression   . Genital herpes   . History of drug abuse ([place])    drug usser [place].  cocaine  . HIV (human immunodeficiency virus infection) ([place]) 1990   from sexual contact who is unknown  . Syphilis    treated    Social History   Tobacco Use  . Smoking status: Former Smoker    Types: Cigarettes    Quit date: 03/01/1990    Years since quitting: 30.8  . Smokeless tobacco: Never Used  Vaping Use  . Vaping Use: Never used  Substance Use Topics  . Alcohol use: No  . Drug use: No    Family History  Problem Relation Age of Onset  . Diabetes Father   . Breast cancer Other     Allergies  Allergen Reactions  . Haldol [Haloperidol] Other (See Comments)    Wipes out her memory  . Nevirapine     REACTION: Rash  10/08  . Thorazine [Chlorpromazine] Other (See Comments)    "makes me comatose"    Health Maintenance  Topic Date Due  . Fecal DNA (Cologuard)  02/07/2021  . MAMMOGRAM  08/17/2021  . PAP SMEAR-Modifier  05/10/2023  . TETANUS/TDAP  10/10/2026  . INFLUENZA VACCINE  Completed  . COVID-19 Vaccine  Completed  . Hepatitis C Screening  Completed  . HIV Screening  Completed    Objective:  Vitals:   12/26/20 1500  BP: (!) 146/98  Pulse: 79  Temp: 98.3 F (36.8 C)  TempSrc: Oral  SpO2: 97%  Weight: 173 lb (78.5 kg)  Height: 5\' 2"  (1.575 m)   Body mass index is 31.64 kg/m.  Physical Exam Constitutional:      Comments: Her spirits are good.  Cardiovascular:     Rate and Rhythm: Normal rate.  Pulmonary:      Effort: Pulmonary effort is normal.  Psychiatric:        Mood and Affect: Mood normal.     Lab Results Lab Results  Component Value Date   WBC 5.3 05/16/2020   HGB 11.6 (L) 05/16/2020   HCT 36.7 05/16/2020   MCV 88.2 05/16/2020   PLT 240 05/16/2020    Lab Results  Component Value Date   CREATININE 1.10 (H) 05/16/2020   BUN 14 05/16/2020   NA 140 05/16/2020   K 4.1 05/16/2020   CL 105 05/16/2020   CO2 28 05/16/2020    Lab Results  Component Value Date   ALT 14 05/16/2020   AST 18 05/16/2020   ALKPHOS 81 12/29/2019   BILITOT 0.7 05/16/2020    Lab Results  Component Value Date   CHOL 160 12/29/2019   HDL 34.40 (L) 12/29/2019   LDLCALC 98 12/29/2019   LDLDIRECT 102.1 03/02/2009   TRIG 135.0 12/29/2019   CHOLHDL 5 12/29/2019   Lab Results  Component Value Date   LABRPR NON-REACTIVE 12/29/2019   HIV 1 RNA Quant  Date Value  12/14/2020 10,600 Copies/mL (H)  08/16/2020 36 Copies/mL (H)  02/22/2020 <20 DETECTED copies/mL (A)   CD4 T Cell Abs (/uL)  Date Value  12/14/2020 169 (L)  08/16/2020 171 (L)  02/22/2020 242 (L)     Problem List Items Addressed This Visit      High   Human immunodeficiency virus (HIV) disease ([place])    As expected, her viral load has reactivated since stopping Biktarvy 2 months ago.  I showed her that [name] was working and completely suppressed her virus when she was taking it last year.  She is willing to retry [place] now.  She will follow-up in 1 month.      Relevant Medications   bictegravir-emtricitabine-tenofovir AF (BIKTARVY) 50-200-25 MG TABS tablet        Michel Bickers, MD [facility] for Infectious [place] [phone] pager   [phone] cell 12/26/2020, 3:17 PM

## 2020-12-26 NOTE — Assessment & Plan Note (Signed)
As expected, her viral load has reactivated since stopping Biktarvy 2 months ago.  I showed her that [name] was working and completely suppressed her virus when she was taking it last year.  She is willing to retry [place] now.  She will follow-up in 1 month.

## 2020-12-27 ENCOUNTER — Telehealth (INDEPENDENT_AMBULATORY_CARE_PROVIDER_SITE_OTHER): Payer: BC Managed Care – PPO | Admitting: Family

## 2020-12-27 DIAGNOSIS — R030 Elevated blood-pressure reading, without diagnosis of hypertension: Secondary | ICD-10-CM | POA: Diagnosis not present

## 2020-12-27 DIAGNOSIS — R42 Dizziness and giddiness: Secondary | ICD-10-CM | POA: Diagnosis not present

## 2020-12-27 MED ORDER — MECLIZINE HCL 25 MG PO TABS: 25.0000 mg | ORAL_TABLET | Freq: Three times a day (TID) | ORAL | 0 refills | Status: DC | PRN

## 2020-12-27 NOTE — Progress Notes (Signed)
Stephanie Espinoza is a 65 y.o. female with the following history as recorded in EpicCare:  Patient Active Problem List   Diagnosis Date Noted  . COVID 11/09/2020  . Urinary incontinence 05/16/2020  . Other fatigue 05/16/2020  . History of syphilis 05/14/2020  . Memory loss 02/14/2020  . Insomnia 02/14/2020  . Herpes zoster 12/31/2019  . Normocytic anemia 03/01/2019  . Hyperglycemia 03/01/2019  . Obesity (BMI 30.0-34.9) 03/01/2019  . Headache 06/30/2018  . Urinary frequency 05/01/2018  . Sciatica 05/26/2017  . Cough 11/05/2016  . 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 Low back pain 06/12/2016  . Essential hypertension 09/16/2014  . 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 Sinusitis 03/22/2009  . Human immunodeficiency virus (HIV) disease ([place]) 09/06/2006  . GENITAL HERPES 09/06/2006  . Depression 09/06/2006  . PAP SMEAR, ABNORMAL 09/06/2006    Current Outpatient Medications  Medication Sig Dispense Refill  . meclizine (ANTIVERT) 25 MG tablet Take 1 tablet (25 mg total) by mouth 3 (three) times daily as needed for dizziness. 30 tablet 0  . acyclovir ointment (ZOVIRAX) 5 % Apply topically every 3 (three) hours. 15 g 3  . albuterol (VENTOLIN HFA) 108 (90 Base) MCG/ACT inhaler Inhale 2 puffs into the lungs every 6 (six) hours as needed for wheezing or shortness of breath. 18 g 2  . bictegravir-emtricitabine-tenofovir AF (BIKTARVY) 50-200-25 MG TABS tablet Take 1 tablet by mouth daily. 30 tablet 11  . fluticasone (FLONASE) 50 MCG/ACT nasal spray Place 2 sprays into both nostrils daily. NEED OFFICE VISIT FOR FURTHER REFILLS 48 mL 0  . sulfamethoxazole-trimethoprim (BACTRIM DS) 800-160 MG tablet Take 1 tablet by mouth daily. (Patient not taking: No sig reported) 30 tablet 11  . valACYclovir (VALTREX) 500 MG tablet 1 po q day and increase to bid x 3 days with symptoms. 90 tablet 3  . Vitamin D, Ergocalciferol, (DRISDOL) 1.25 MG (50000 UNIT) CAPS capsule TAKE 1  CAPSULE (50,000 UNITS TOTAL) BY MOUTH EVERY 7 (SEVEN) DAYS. 12 capsule 3   No current facility-administered medications for this visit.    Allergies: Haldol [haloperidol], Nevirapine, and Thorazine [chlorpromazine]  Past Medical History:  Diagnosis Date  . Abnormal Pap smear of cervix before 1995   always a repeat back to normal except for 1 time and had colpo bioosy -resutls were normal  . Allergic rhinitis   . Anxiety   . Bell's palsy after 1995 ?   no residual SE.  [name] Depression   . Genital herpes   . History of drug abuse ([place])    drug usser [place].  cocaine  . HIV (human immunodeficiency virus infection) ([place]) 1990   from sexual contact who is unknown  . Syphilis    treated    Past Surgical History:  Procedure Laterality Date  . CESAREAN SECTION  1997  . ESOPHAGOGASTRODUODENOSCOPY (EGD) WITH PROPOFOL N/A 03/02/2019   Procedure: ESOPHAGOGASTRODUODENOSCOPY (EGD) WITH PROPOFOL;  Surgeon: Arta Silence, MD;  Location: [facility];  Service: Endoscopy;  Laterality: N/A;  . EUS N/A 03/02/2019   Procedure: FULL UPPER ENDOSCOPIC ULTRASOUND (EUS) RADIAL;  Surgeon: Arta Silence, MD;  Location: [facility];  Service: Endoscopy;  Laterality: N/A;  . LAPAROSCOPIC CHOLECYSTECTOMY SINGLE PORT N/A 03/01/2019   Procedure: LAPAROSCOPIC CHOLECYSTECTOMY SINGLE SITE/ INTRAOPERATIVE CHOLANGIOGRAM;  Surgeon: Michael Boston, MD;  Location: [facility];  Service: General;  Laterality: N/A;    Family History  Problem Relation Age of Onset  . Diabetes Father   . Breast cancer  Other     Social History   Tobacco Use  . Smoking status: Former Smoker    Types: Cigarettes    Quit date: 03/01/1990    Years since quitting: 30.8  . Smokeless tobacco: Never Used  Substance Use Topics  . Alcohol use: No    Subjective:   I connected with FALLEN CRISOSTOMO on 12/27/20 at  9:20 AM EST by a video enabled telemedicine application and verified that I am speaking with the correct person using two  identifiers.   I discussed the limitations of evaluation and management by telemedicine and the availability of in person appointments. The patient expressed understanding and agreed to proceed. Provider in office/ patient is at home; provider and patient are only 2 people on video call.   Patient notes she woke up this am with sensation of feeling "light headed, nauseated and dizzy." She does history of vertigo and notes that symptoms do seem similar. Sensations are more noticeable with certain movements. Able to go to work today;  Patient saw her ID provider yesterday- has not started medication prescribed at that visit; blood pressure was elevated yesterday at 146/98; in reviewing blood pressure readings, they appear to have been elevated on repeated occasions since November 2021; patient does not have known HTN/ does not check her blood pressure regularly; feels that her blood pressure reading yesterday was not accurate as it was done with a digital cuff which patient does not do well with; patient does not add salt to her food/ eats healthy diet- tries to limit processed foods.    Objective:  There were no vitals filed for this visit.  General: Well developed, well nourished, in no acute distress  Skin : Warm and dry.  Head: Normocephalic and atraumatic  Lungs: Respirations unlabored;  Neurologic: Alert and oriented; speech intact; face symmetrical;   Assessment:  1. Dizziness   2. Elevated blood pressure reading     Plan:  ? Vertigo and/ or elevated blood pressure; will go ahead and call in Antivert for patient to use tid prn; She is encouraged to start checking her blood pressure regularly- at least once or twice a day for the next week; see her PCP in office next week with log and home cuff for comparison; may need to consider trial of low dose blood pressure medication. In reviewing records, she has been prescribed HCTZ in the past- unclear why this was stopped.  Scheduled to see her  PCP next Tuesday, 3/1;  No follow-ups on file.  No orders of the defined types were placed in this encounter.   Requested Prescriptions   Signed Prescriptions Disp Refills  . meclizine (ANTIVERT) 25 MG tablet 30 tablet 0    Sig: Take 1 tablet (25 mg total) by mouth 3 (three) times daily as needed for dizziness.

## 2021-01-02 ENCOUNTER — Ambulatory Visit: Payer: BC Managed Care – PPO | Admitting: Internal Medicine

## 2021-01-02 ENCOUNTER — Ambulatory Visit (INDEPENDENT_AMBULATORY_CARE_PROVIDER_SITE_OTHER): Payer: BC Managed Care – PPO

## 2021-01-02 ENCOUNTER — Encounter: Payer: Self-pay | Admitting: Internal Medicine

## 2021-01-02 ENCOUNTER — Other Ambulatory Visit: Payer: Self-pay

## 2021-01-02 VITALS — BP 132/76 | HR 78 | Temp 98.4°F | Resp 18 | Ht 62.0 in | Wt 174.2 lb

## 2021-01-02 DIAGNOSIS — R5383 Other fatigue: Secondary | ICD-10-CM | POA: Diagnosis not present

## 2021-01-02 DIAGNOSIS — H8110 Benign paroxysmal vertigo, unspecified ear: Secondary | ICD-10-CM

## 2021-01-02 DIAGNOSIS — R0602 Shortness of breath: Secondary | ICD-10-CM

## 2021-01-02 DIAGNOSIS — I10 Essential (primary) hypertension: Secondary | ICD-10-CM

## 2021-01-02 DIAGNOSIS — U071 COVID-19: Secondary | ICD-10-CM

## 2021-01-02 LAB — CBC
HCT: 35.1 % — ABNORMAL LOW (ref 36.0–46.0)
Hemoglobin: 11.6 g/dL — ABNORMAL LOW (ref 12.0–15.0)
MCHC: 32.9 g/dL (ref 30.0–36.0)
MCV: 84.5 fl (ref 78.0–100.0)
Platelets: 193 10*3/uL (ref 150.0–400.0)
RBC: 4.16 Mil/uL (ref 3.87–5.11)
RDW: 14.6 % (ref 11.5–15.5)
WBC: 5.2 10*3/uL (ref 4.0–10.5)

## 2021-01-02 LAB — COMPREHENSIVE METABOLIC PANEL
ALT: 27 U/L (ref 0–35)
AST: 25 U/L (ref 0–37)
Albumin: 3.8 g/dL (ref 3.5–5.2)
Alkaline Phosphatase: 85 U/L (ref 39–117)
BUN: 18 mg/dL (ref 6–23)
CO2: 28 mEq/L (ref 19–32)
Calcium: 9.5 mg/dL (ref 8.4–10.5)
Chloride: 105 mEq/L (ref 96–112)
Creatinine, Ser: 1.11 mg/dL (ref 0.40–1.20)
GFR: 53.35 mL/min — ABNORMAL LOW (ref >60.00)
Glucose, Bld: 91 mg/dL (ref 70–99)
Potassium: 3.9 mEq/L (ref 3.5–5.1)
Sodium: 138 mEq/L (ref 135–145)
Total Bilirubin: 0.6 mg/dL (ref 0.2–1.2)
Total Protein: 7.9 g/dL (ref 6.0–8.3)

## 2021-01-02 LAB — VITAMIN D 25 HYDROXY (VIT D DEFICIENCY, FRACTURES): VITD: 62.55 ng/mL (ref 30.00–100.00)

## 2021-01-02 LAB — VITAMIN B12: Vitamin B-12: 196 pg/mL — ABNORMAL LOW (ref 211–911)

## 2021-01-02 LAB — TSH: TSH: 1.99 u[IU]/mL (ref 0.35–4.50)

## 2021-01-02 NOTE — Progress Notes (Signed)
   Subjective:   Patient ID: Stephanie Espinoza, female    DOB: 06/29/1956, 65 y.o.   MRN: [id number]  HPI The patient is a 65 YO female coming in for fatigue and overall not feeling well. Is about to start back on biktarvy for her HIV and having some dizziness. More with position change of her head. Has to be careful more with waking up in the morning. Denies syncope or feeling like she would pass out. Has had some high BP readings at home with wrist monitor. Not sure if this is accurate. Also concerned with her recent Covid in January. Denies SOB or cough still.  Review of Systems  Constitutional: Positive for fatigue.  HENT: Negative.   Eyes: Negative.   Respiratory: Negative for cough, chest tightness and shortness of breath.   Cardiovascular: Negative for chest pain, palpitations and leg swelling.  Gastrointestinal: Negative for abdominal distention, abdominal pain, constipation, diarrhea, nausea and vomiting.  Musculoskeletal: Negative.   Skin: Negative.   Neurological: Positive for dizziness. Negative for seizures, weakness and headaches.  Psychiatric/Behavioral: Positive for decreased concentration and sleep disturbance. Negative for self-injury.    Objective:  Physical Exam Constitutional:      Appearance: She is well-developed and well-nourished.  HENT:     Head: Normocephalic and atraumatic.  Eyes:     Extraocular Movements: EOM normal.  Cardiovascular:     Rate and Rhythm: Normal rate and regular rhythm.  Pulmonary:     Effort: Pulmonary effort is normal. No respiratory distress.     Breath sounds: Normal breath sounds. No wheezing or rales.  Abdominal:     General: Bowel sounds are normal. There is no distension.     Palpations: Abdomen is soft.     Tenderness: There is no abdominal tenderness. There is no rebound.  Musculoskeletal:        General: No edema.     Cervical back: Normal range of motion.  Skin:    General: Skin is warm and dry.  Neurological:     Mental  Status: She is alert and oriented to person, place, and time.     Coordination: Coordination normal.  Psychiatric:        Mood and Affect: Mood and affect normal.     Vitals:   01/02/21 1458  BP: 132/76  Pulse: 78  Resp: 18  Temp: 98.4 F (36.9 C)  TempSrc: Oral  SpO2: 98%  Weight: 174 lb 3.2 oz (79 kg)  Height: 5\' 2"  (1.575 m)    This visit occurred during the SARS-CoV-2 public health emergency.  Safety protocols were in place, including screening questions prior to the visit, additional usage of staff PPE, and extensive cleaning of exam room while observing appropriate contact time as indicated for disinfecting solutions.   Assessment & Plan:  Visit time 25 minutes in face to face communication with patient and coordination of care, additional 10 minutes spent in record review, coordination or care, ordering tests, communicating/referring to other healthcare professionals, documenting in medical records all on the same day of the visit for total time 35 minutes spent on the visit.

## 2021-01-02 NOTE — Patient Instructions (Addendum)
We will get you in to the physical therapist to help with the dizziness.  We will check the chest x-ray and the blood work today.

## 2021-01-04 ENCOUNTER — Encounter: Payer: Self-pay | Admitting: Internal Medicine

## 2021-01-05 NOTE — Assessment & Plan Note (Signed)
Checking CXR to rule out persistent changes or changes related to HIV given her low CD4 count.

## 2021-01-05 NOTE — Assessment & Plan Note (Signed)
BP at goal here off medications and do not think she needs medications currently. Advised to get arm BP to check and monitor.

## 2021-01-05 NOTE — Assessment & Plan Note (Signed)
Checking TSH, vitamin D, B12, CBC, CMP. Treat as appropriate.

## 2021-01-05 NOTE — Assessment & Plan Note (Signed)
Referral to PT for therapy to help.

## 2021-01-11 ENCOUNTER — Ambulatory Visit: Payer: BC Managed Care – PPO

## 2021-01-11 ENCOUNTER — Encounter: Payer: Self-pay | Admitting: Internal Medicine

## 2021-01-11 ENCOUNTER — Other Ambulatory Visit: Payer: Self-pay

## 2021-01-11 DIAGNOSIS — E538 Deficiency of other specified B group vitamins: Secondary | ICD-10-CM

## 2021-01-11 MED ORDER — CYANOCOBALAMIN 1000 MCG/ML IJ SOLN
1000.0000 ug | Freq: Once | INTRAMUSCULAR | Status: AC
Start: 2021-01-11 — End: 2021-01-25
  Administered 2021-01-25: 1000 ug via INTRAMUSCULAR

## 2021-01-11 NOTE — Telephone Encounter (Signed)
Patient is requesting a renewal. Before we did : Short term incapacity approved until 02/13/21 for 8 day a month. Please advise if you approve again and for how long.  Thank you.

## 2021-01-11 NOTE — Progress Notes (Signed)
B12 given

## 2021-01-15 DIAGNOSIS — Z0279 Encounter for issue of other medical certificate: Secondary | ICD-10-CM

## 2021-01-15 NOTE — Telephone Encounter (Signed)
Forms have been completed and signed. Faxed to [email or web address] [email or web address] [phone], Copy sent to scan &Charged for.   Patient informed via MyChart and original mailed to patient for her records.

## 2021-01-18 ENCOUNTER — Telehealth (INDEPENDENT_AMBULATORY_CARE_PROVIDER_SITE_OTHER): Payer: BC Managed Care – PPO | Admitting: Internal Medicine

## 2021-01-18 ENCOUNTER — Encounter: Payer: Self-pay | Admitting: Internal Medicine

## 2021-01-18 ENCOUNTER — Other Ambulatory Visit: Payer: Self-pay

## 2021-01-18 DIAGNOSIS — B20 Human immunodeficiency virus [HIV] disease: Secondary | ICD-10-CM

## 2021-01-18 NOTE — Progress Notes (Signed)
Virtual Visit via Telephone Note  I connected with Stephanie Espinoza on 01/18/21 at  9:00 AM EDT by telephone and verified that I am speaking with the correct person using two identifiers.  Location: Patient: Home Provider: [facility]   I discussed the limitations, risks, security and privacy concerns of performing an evaluation and management service by telephone and the availability of in person appointments. I also discussed with the patient that there may be a patient responsible charge related to this service. The patient expressed understanding and agreed to proceed.   History of Present Illness: I called and spoke with Stephanie Espinoza this morning.  She recently restarted Biktarvy.  She states that since starting it she has been having some mild posterior headaches, nausea, dizziness, "gas" and more fatigue.  She has not tried treating any of these symptoms.  She has not had any vomiting.   Observations/Objective: HIV 1 RNA Quant  Date Value  12/14/2020 10,600 Copies/mL (H)  08/16/2020 36 Copies/mL (H)  02/22/2020 <20 DETECTED copies/mL (A)   CD4 T Cell Abs (/uL)  Date Value  12/14/2020 169 (L)  08/16/2020 171 (L)  02/22/2020 242 (L)    Assessment and Plan: Stephanie Espinoza has a very long history of poor tolerance of multiple different antiretroviral regimens.  She was not feeling well before starting Biktarvy but feels like she is feeling worse now.  I encouraged her to go ahead and try over-the-counter medication to treat her symptoms.  She may be a good candidate for injectable Cabotegravir in the near future.  She is willing to continue Stephanie Espinoza for now  Follow Up Instructions: Continue Biktarvy Over-the-counter treatment of her mild symptoms   I discussed the assessment and treatment plan with the patient. The patient was provided an opportunity to ask questions and all were answered. The patient agreed with the plan and demonstrated an understanding of the instructions.   The patient was advised to  call back or seek an in-person evaluation if the symptoms worsen or if the condition fails to improve as anticipated.  I provided 14 minutes of non-face-to-face time during this encounter.   Michel Bickers, MD

## 2021-01-23 ENCOUNTER — Telehealth: Payer: Self-pay

## 2021-01-23 ENCOUNTER — Telehealth (INDEPENDENT_AMBULATORY_CARE_PROVIDER_SITE_OTHER): Payer: BC Managed Care – PPO | Admitting: Internal Medicine

## 2021-01-23 ENCOUNTER — Other Ambulatory Visit: Payer: Self-pay

## 2021-01-23 ENCOUNTER — Encounter: Payer: Self-pay | Admitting: Internal Medicine

## 2021-01-23 DIAGNOSIS — B20 Human immunodeficiency virus [HIV] disease: Secondary | ICD-10-CM

## 2021-01-23 NOTE — Telephone Encounter (Signed)
Per Dr. Megan Salon, contacted patient to see if she could do a virtual visit this afternoon. Left voicemail requesting call back to confirm  Carlean Purl, RN

## 2021-01-23 NOTE — Progress Notes (Signed)
Virtual Visit via Telephone Note  I connected with Stephanie Espinoza on 01/23/21 at  3:00 PM EDT by telephone and verified that I am speaking with the correct person using two identifiers.  Location: Patient: Home  Provider: [facility]   I discussed the limitations, risks, security and privacy concerns of performing an evaluation and management service by telephone and the availability of in person appointments. I also discussed with the patient that there may be a patient responsible charge related to this service. The patient expressed understanding and agreed to proceed.   History of Present Illness: I called and spoke with Stephanie Espinoza today.  She continues taking Biktarvy.  She is still concerned about possible side effects including headache, dizziness and intermittent nausea.  She has been taking acetaminophen for the headaches and this helps a little bit.  She has not tried taking meclizine and does not feel she needs to take anything for her nausea.   Observations/Objective: HIV 1 RNA Quant  Date Value  12/14/2020 10,600 Copies/mL (H)  08/16/2020 36 Copies/mL (H)  02/22/2020 <20 DETECTED copies/mL (A)   CD4 T Cell Abs (/uL)  Date Value  12/14/2020 169 (L)  08/16/2020 171 (L)  02/22/2020 242 (L)    Assessment and Plan: Stephanie Espinoza feels like she can continue [employer].  Follow Up Instructions: Follow-up here for repeat blood work in 1 month   I discussed the assessment and treatment plan with the patient. The patient was provided an opportunity to ask questions and all were answered. The patient agreed with the plan and demonstrated an understanding of the instructions.   The patient was advised to call back or seek an in-person evaluation if the symptoms worsen or if the condition fails to improve as anticipated.  I provided 13 minutes of non-face-to-face time during this encounter.   Michel Bickers, MD

## 2021-01-23 NOTE — Telephone Encounter (Signed)
Patient called back, agreeable to virtual visit.   Beryle Flock, RN

## 2021-01-24 ENCOUNTER — Other Ambulatory Visit: Payer: Self-pay

## 2021-01-25 ENCOUNTER — Ambulatory Visit (INDEPENDENT_AMBULATORY_CARE_PROVIDER_SITE_OTHER): Payer: BC Managed Care – PPO

## 2021-01-25 ENCOUNTER — Ambulatory Visit: Payer: BC Managed Care – PPO

## 2021-01-25 DIAGNOSIS — E538 Deficiency of other specified B group vitamins: Secondary | ICD-10-CM

## 2021-01-25 MED ORDER — CYANOCOBALAMIN 1000 MCG/ML IJ SOLN
1000.0000 ug | INTRAMUSCULAR | Status: AC
  Administered 2021-02-15 – 2021-03-01 (×2): 1000 ug via INTRAMUSCULAR

## 2021-01-25 NOTE — Progress Notes (Signed)
Patient ID: Stephanie Espinoza, female   DOB: [birth year], 65 y.o.   MRN: [id number] Medical treatment/procedure(s) were performed by non-physician practitioner and as supervising physician I was immediately available for consultation/collaboration. I agree with above. Hoyt Koch, MD

## 2021-01-25 NOTE — Progress Notes (Signed)
Patient here for bi-weekly B12 injection per Dr. Sharlet Salina. B12 1000 mcg given in left IM and patient tolerated injection well today.

## 2021-01-26 ENCOUNTER — Encounter: Payer: Self-pay | Admitting: Internal Medicine

## 2021-02-01 ENCOUNTER — Telehealth: Payer: Self-pay | Admitting: Internal Medicine

## 2021-02-01 NOTE — Telephone Encounter (Signed)
Patient seeking advice   Patient states bictegravir-emtricitabine-tenofovir AF (BIKTARVY) 50-200-25 MG TABS tablet causing her to have dizziness and nausea    Please advise

## 2021-02-02 NOTE — Telephone Encounter (Signed)
Spoke with the patient and she stated that the dizziness and nausea started as soon as she started to take the mediation. She is still currently taking the medication. She stated that nausea is more or less of feeling that she could vomit. The feeling is constant. She took baking soda and water to settle her stomach. She has bloating and gas as well. Please advise

## 2021-02-02 NOTE — Telephone Encounter (Signed)
Since her infectious disease doctor prescribed she should call their office to see what they want her to do.

## 2021-02-02 NOTE — Telephone Encounter (Signed)
Spoke with the patient and she verbalized understanding.

## 2021-02-06 ENCOUNTER — Other Ambulatory Visit: Payer: BC Managed Care – PPO

## 2021-02-06 ENCOUNTER — Other Ambulatory Visit: Payer: Self-pay

## 2021-02-06 DIAGNOSIS — B20 Human immunodeficiency virus [HIV] disease: Secondary | ICD-10-CM

## 2021-02-07 LAB — T-HELPER CELL (CD4) - (RCID CLINIC ONLY)
CD4 % Helper T Cell: 8 % — ABNORMAL LOW (ref 33–65)
CD4 T Cell Abs: 190 /uL — ABNORMAL LOW (ref 400–1790)

## 2021-02-08 LAB — HIV-1 RNA QUANT-NO REFLEX-BLD
HIV 1 RNA Quant: NOT DETECTED Copies/mL
HIV-1 RNA Quant, Log: NOT DETECTED Log cps/mL

## 2021-02-13 ENCOUNTER — Ambulatory Visit: Payer: BC Managed Care – PPO

## 2021-02-13 ENCOUNTER — Other Ambulatory Visit: Payer: Self-pay

## 2021-02-14 ENCOUNTER — Other Ambulatory Visit: Payer: Self-pay

## 2021-02-15 ENCOUNTER — Ambulatory Visit (INDEPENDENT_AMBULATORY_CARE_PROVIDER_SITE_OTHER): Payer: BC Managed Care – PPO

## 2021-02-15 DIAGNOSIS — E538 Deficiency of other specified B group vitamins: Secondary | ICD-10-CM

## 2021-02-15 NOTE — Progress Notes (Signed)
Pt here for Bi weekly B12 injection per Dr. Sharlet Salina  B12 1075mcg given, left deltoid IM, and pt tolerated injection well.  Next B12 injection scheduled for 2 weeks.

## 2021-02-20 ENCOUNTER — Ambulatory Visit: Payer: BC Managed Care – PPO | Admitting: Internal Medicine

## 2021-02-20 ENCOUNTER — Other Ambulatory Visit: Payer: Self-pay

## 2021-02-20 ENCOUNTER — Encounter: Payer: Self-pay | Admitting: Internal Medicine

## 2021-02-20 DIAGNOSIS — B20 Human immunodeficiency virus [HIV] disease: Secondary | ICD-10-CM

## 2021-02-20 NOTE — Assessment & Plan Note (Signed)
Her infection has responded well to [facility] and she is having some CD4 reconstitution.  She will continue Biktarvy and follow-up in 3 months.

## 2021-02-20 NOTE — Progress Notes (Signed)
Patient Active Problem List   Diagnosis Date Noted  . Human immunodeficiency virus (HIV) disease ([place]) 09/06/2006    Priority: High  . GENITAL HERPES 09/06/2006    Priority: Medium  . COVID 11/09/2020  . Urinary incontinence 05/16/2020  . Other fatigue 05/16/2020  . History of syphilis 05/14/2020  . Memory loss 02/14/2020  . Insomnia 02/14/2020  . Herpes zoster 12/31/2019  . Normocytic anemia 03/01/2019  . Hyperglycemia 03/01/2019  . Obesity (BMI 30.0-34.9) 03/01/2019  . Headache 06/30/2018  . Urinary frequency 05/01/2018  . Sciatica 05/26/2017  . Cough 11/05/2016  . 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 Low back pain 06/12/2016  . Essential hypertension 09/16/2014  . 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 Benign paroxysmal positional vertigo 09/21/2012  . Sinusitis 03/22/2009  . Depression 09/06/2006  . PAP SMEAR, ABNORMAL 09/06/2006    Patient's Medications  New Prescriptions   No medications on file  Previous Medications   ACYCLOVIR OINTMENT (ZOVIRAX) 5 %    Apply topically every 3 (three) hours.   ALBUTEROL (VENTOLIN HFA) 108 (90 BASE) MCG/ACT INHALER    Inhale 2 puffs into the lungs every 6 (six) hours as needed for wheezing or shortness of breath.   BICTEGRAVIR-EMTRICITABINE-TENOFOVIR AF (BIKTARVY) 50-200-25 MG TABS TABLET    Take 1 tablet by mouth daily.   FLUTICASONE (FLONASE) 50 MCG/ACT NASAL SPRAY    Place 2 sprays into both nostrils daily. NEED OFFICE VISIT FOR FURTHER REFILLS   MECLIZINE (ANTIVERT) 25 MG TABLET    Take 1 tablet (25 mg total) by mouth 3 (three) times daily as needed for dizziness.   SULFAMETHOXAZOLE-TRIMETHOPRIM (BACTRIM DS) 800-160 MG TABLET    Take 1 tablet by mouth daily.   VALACYCLOVIR (VALTREX) 500 MG TABLET    1 po q day and increase to bid x 3 days with symptoms.   VITAMIN D, ERGOCALCIFEROL, (DRISDOL) 1.25 MG (50000 UNIT) CAPS CAPSULE    TAKE 1 CAPSULE (50,000 UNITS TOTAL) BY MOUTH EVERY 7  (SEVEN) DAYS.  Modified Medications   No medications on file  Discontinued Medications   No medications on file    Subjective: Stephanie Espinoza is in for her routine HIV follow-up visit.  She recently started back on Biktarvy.  She has not been missing doses.  She continues to have a variety of symptoms including intermittent headache, fatigue and night sweats.  Her and that she was not feeling well even before she restarted Biktarvy.  She believes the night sweats are a side effect of Biktarvy but feels like she can continue to take it for now.  She had expressed interest in the newer injectable antiretrovirals but says that she would not want to take them now after thinking about it more.  Review of Systems: Review of Systems  Constitutional: Positive for diaphoresis and malaise/fatigue. Negative for fever and weight loss.    Past Medical History:  Diagnosis Date  . Abnormal Pap smear of cervix before 1995   always a repeat back to normal except for 1 time and had colpo bioosy -resutls were normal  . Allergic rhinitis   . Anxiety   . Bell's palsy after 1995 ?   no residual SE.  [name] Depression   . Genital herpes   . History of drug abuse ([place])    drug usser [place].  cocaine  . HIV (human immunodeficiency virus infection) ([place]) 1990   from sexual contact who is  unknown  . Syphilis    treated    Social History   Tobacco Use  . Smoking status: Former Smoker    Types: Cigarettes    Quit date: 03/01/1990    Years since quitting: 30.9  . Smokeless tobacco: Never Used  Vaping Use  . Vaping Use: Never used  Substance Use Topics  . Alcohol use: No  . Drug use: No    Family History  Problem Relation Age of Onset  . Diabetes Father   . Breast cancer Other     Allergies  Allergen Reactions  . Haldol [Haloperidol] Other (See Comments)    Wipes out her memory  . Nevirapine     REACTION: Rash 10/08  . Thorazine [Chlorpromazine] Other (See Comments)    "makes me comatose"     Health Maintenance  Topic Date Due  . Fecal DNA (Cologuard)  02/07/2021  . INFLUENZA VACCINE  06/04/2021  . MAMMOGRAM  08/17/2021  . PAP SMEAR-Modifier  05/10/2023  . TETANUS/TDAP  10/10/2026  . COVID-19 Vaccine  Completed  . Hepatitis C Screening  Completed  . HIV Screening  Completed  . HPV VACCINES  Aged Out    Objective:  Vitals:   02/20/21 1454  BP: 127/82  Pulse: 92  Temp: (!) 97.4 F (36.3 C)  TempSrc: Oral  SpO2: 98%  Weight: 181 lb (82.1 kg)   Body mass index is 33.11 kg/m.  Physical Exam Constitutional:      Comments: Her spirits are good.  Cardiovascular:     Rate and Rhythm: Normal rate.  Pulmonary:     Effort: Pulmonary effort is normal.  Psychiatric:        Mood and Affect: Mood normal.     Lab Results Lab Results  Component Value Date   WBC 5.2 01/02/2021   HGB 11.6 (L) 01/02/2021   HCT 35.1 (L) 01/02/2021   MCV 84.5 01/02/2021   PLT 193.0 01/02/2021    Lab Results  Component Value Date   CREATININE 1.11 01/02/2021   BUN 18 01/02/2021   NA 138 01/02/2021   K 3.9 01/02/2021   CL 105 01/02/2021   CO2 28 01/02/2021    Lab Results  Component Value Date   ALT 27 01/02/2021   AST 25 01/02/2021   ALKPHOS 85 01/02/2021   BILITOT 0.6 01/02/2021    Lab Results  Component Value Date   CHOL 160 12/29/2019   HDL 34.40 (L) 12/29/2019   LDLCALC 98 12/29/2019   LDLDIRECT 102.1 03/02/2009   TRIG 135.0 12/29/2019   CHOLHDL 5 12/29/2019   Lab Results  Component Value Date   LABRPR NON-REACTIVE 12/29/2019   HIV 1 RNA Quant (Copies/mL)  Date Value  02/06/2021 Not Detected  12/14/2020 10,600 (H)  08/16/2020 36 (H)   CD4 T Cell Abs (/uL)  Date Value  02/06/2021 190 (L)  12/14/2020 169 (L)  08/16/2020 171 (L)     Problem List Items Addressed This Visit      High   Human immunodeficiency virus (HIV) disease ([place])    Her infection has responded well to [facility] and she is having some CD4 reconstitution.  She will continue  Biktarvy and follow-up in 3 months.           Michel Bickers, MD [facility] [phone] pager   [phone] cell 02/20/2021, 3:06 PM

## 2021-02-22 ENCOUNTER — Ambulatory Visit: Payer: BC Managed Care – PPO | Admitting: Internal Medicine

## 2021-02-27 ENCOUNTER — Other Ambulatory Visit: Payer: Self-pay

## 2021-02-27 ENCOUNTER — Ambulatory Visit: Payer: BC Managed Care – PPO

## 2021-02-28 ENCOUNTER — Encounter: Payer: Self-pay | Admitting: Internal Medicine

## 2021-03-01 ENCOUNTER — Other Ambulatory Visit: Payer: Self-pay

## 2021-03-01 ENCOUNTER — Ambulatory Visit (INDEPENDENT_AMBULATORY_CARE_PROVIDER_SITE_OTHER): Payer: BC Managed Care – PPO

## 2021-03-01 ENCOUNTER — Encounter: Payer: Self-pay | Admitting: Internal Medicine

## 2021-03-01 DIAGNOSIS — R42 Dizziness and giddiness: Secondary | ICD-10-CM

## 2021-03-01 DIAGNOSIS — E538 Deficiency of other specified B group vitamins: Secondary | ICD-10-CM

## 2021-03-01 MED ORDER — FLUTICASONE PROPIONATE 50 MCG/ACT NA SUSP: 2.0000 | Freq: Every day | NASAL | 2 refills | Status: DC

## 2021-03-01 NOTE — Progress Notes (Signed)
Pt here for biweely B12 injection per Dr. Crawford  B12 1000mcg givenl eft deltoid IM, and pt tolerated injection well.  Next B12 injection scheduled for 03/15/21  

## 2021-03-06 ENCOUNTER — Encounter: Payer: Self-pay | Admitting: Internal Medicine

## 2021-03-07 ENCOUNTER — Ambulatory Visit: Payer: BC Managed Care – PPO | Admitting: Internal Medicine

## 2021-03-07 ENCOUNTER — Encounter: Payer: Self-pay | Admitting: Internal Medicine

## 2021-03-07 ENCOUNTER — Other Ambulatory Visit: Payer: Self-pay

## 2021-03-07 ENCOUNTER — Ambulatory Visit
Admission: RE | Admit: 2021-03-07 | Discharge: 2021-03-07 | Disposition: A | Discharge status: Home / Self Care | Payer: BC Managed Care – PPO | Source: Ambulatory Visit | Attending: Internal Medicine | Admitting: Internal Medicine

## 2021-03-07 VITALS — BP 130/72 | HR 91 | Temp 98.4°F | Resp 18 | Ht 62.0 in | Wt 178.8 lb

## 2021-03-07 DIAGNOSIS — R202 Paresthesia of skin: Secondary | ICD-10-CM

## 2021-03-07 DIAGNOSIS — E538 Deficiency of other specified B group vitamins: Secondary | ICD-10-CM | POA: Diagnosis not present

## 2021-03-07 DIAGNOSIS — B20 Human immunodeficiency virus [HIV] disease: Secondary | ICD-10-CM | POA: Diagnosis not present

## 2021-03-07 DIAGNOSIS — R200 Anesthesia of skin: Secondary | ICD-10-CM | POA: Diagnosis not present

## 2021-03-07 DIAGNOSIS — R29898 Other symptoms and signs involving the musculoskeletal system: Secondary | ICD-10-CM

## 2021-03-07 LAB — COMPREHENSIVE METABOLIC PANEL
ALT: 17 U/L (ref 0–35)
AST: 18 U/L (ref 0–37)
Albumin: 3.9 g/dL (ref 3.5–5.2)
Alkaline Phosphatase: 93 U/L (ref 39–117)
BUN: 18 mg/dL (ref 6–23)
CO2: 28 mEq/L (ref 19–32)
Calcium: 9.6 mg/dL (ref 8.4–10.5)
Chloride: 105 mEq/L (ref 96–112)
Creatinine, Ser: 1.14 mg/dL (ref 0.40–1.20)
GFR: 51.6 mL/min — ABNORMAL LOW (ref >60.00)
Glucose, Bld: 98 mg/dL (ref 70–99)
Potassium: 4.1 mEq/L (ref 3.5–5.1)
Sodium: 139 mEq/L (ref 135–145)
Total Bilirubin: 0.6 mg/dL (ref 0.2–1.2)
Total Protein: 8.1 g/dL (ref 6.0–8.3)

## 2021-03-07 LAB — CBC
HCT: 37.6 % (ref 36.0–46.0)
Hemoglobin: 12.3 g/dL (ref 12.0–15.0)
MCHC: 32.7 g/dL (ref 30.0–36.0)
MCV: 87.5 fl (ref 78.0–100.0)
Platelets: 224 10*3/uL (ref 150.0–400.0)
RBC: 4.29 Mil/uL (ref 3.87–5.11)
RDW: 15.8 % — ABNORMAL HIGH (ref 11.5–15.5)
WBC: 4.8 10*3/uL (ref 4.0–10.5)

## 2021-03-07 LAB — VITAMIN D 25 HYDROXY (VIT D DEFICIENCY, FRACTURES): VITD: 57.92 ng/mL (ref 30.00–100.00)

## 2021-03-07 LAB — VITAMIN B12: Vitamin B-12: 931 pg/mL — ABNORMAL HIGH (ref 211–911)

## 2021-03-07 LAB — TSH: TSH: 2.96 u[IU]/mL (ref 0.35–4.50)

## 2021-03-07 LAB — T4, FREE: Free T4: 0.77 ng/dL (ref 0.60–1.60)

## 2021-03-07 NOTE — Assessment & Plan Note (Signed)
She has stopped HAART recently and CD4 count persistently <200 for last year with typical viral load >10K without treatment. She is now off treatment for >2 weeks which could be cause of the neuropathy although we need to rule out other etiology first. If no cause detected talked to patient about potential need for nerve conduction test.

## 2021-03-07 NOTE — Assessment & Plan Note (Signed)
She has been getting B12 shots for several months now so it seems unlikely this is the cause of the new numbness and weakness. Rechecking levels today.

## 2021-03-07 NOTE — Progress Notes (Signed)
   Subjective:   Patient ID: Stephanie Espinoza, female    DOB: Feb 03, 1956, 65 y.o.   MRN: [id number]  HPI The patient is a 65 YO female coming in for concerns about leg numbness and weakness which is new. Started last night with tingling and numb feeling in both legs. She was standing and working at the time and felt it was just that. She sat down and the feeling did not improve. She went to bed that night and elevated feet to see if this would help. She awoke with same numbness feeling in her legs. Starts in feet and goes to level of thighs. She is also having weakness and is scared with walking as she does not know if her legs will support her. She does have concurrent HIV and stopped takes her HAART about 2 weeks ago (was on for 50 days). Last known CD4 count <200. She is on bactrim for ppx. She has never had problems like this before. She does not have low back pain. No injury or fall. No change in diet or exercise. No new medications. Overall stable but not improving. Denies change in speech or facial drooping. Denies leg swelling or rash/change in color.  Review of Systems  Constitutional: Negative.   HENT: Negative.   Eyes: Negative.   Respiratory: Negative for cough, chest tightness and shortness of breath.   Cardiovascular: Negative for chest pain, palpitations and leg swelling.  Gastrointestinal: Negative for abdominal distention, abdominal pain, constipation, diarrhea, nausea and vomiting.  Musculoskeletal: Negative.   Skin: Negative.   Neurological: Positive for weakness and numbness.  Psychiatric/Behavioral: Negative.     Objective:  Physical Exam Constitutional:      Appearance: She is well-developed.  HENT:     Head: Normocephalic and atraumatic.  Cardiovascular:     Rate and Rhythm: Normal rate and regular rhythm.  Pulmonary:     Effort: Pulmonary effort is normal. No respiratory distress.     Breath sounds: Normal breath sounds. No wheezing or rales.  Abdominal:      General: Bowel sounds are normal. There is no distension.     Palpations: Abdomen is soft.     Tenderness: There is no abdominal tenderness. There is no rebound.  Musculoskeletal:     Cervical back: Normal range of motion.     Comments: Pulses present bilateral DP and PT, there is reduced sensation to fine touch with intact strength bilateral LE, feet are warm to touch, no calf tenderness or swelling.   Skin:    General: Skin is warm and dry.  Neurological:     Mental Status: She is alert and oriented to person, place, and time.     Coordination: Coordination normal.     Vitals:   03/07/21 0837  BP: 130/72  Pulse: 91  Resp: 18  Temp: 98.4 F (36.9 C)  TempSrc: Oral  SpO2: 96%  Weight: 178 lb 12.8 oz (81.1 kg)  Height: 5\' 2"  (1.575 m)    This visit occurred during the SARS-CoV-2 public health emergency.  Safety protocols were in place, including screening questions prior to the visit, additional usage of staff PPE, and extensive cleaning of exam room while observing appropriate contact time as indicated for disinfecting solutions.   Assessment & Plan:

## 2021-03-07 NOTE — Assessment & Plan Note (Addendum)
New and concerning for stroke symptoms. Ordered stat MRI brain and labs to evaluate for source of neuropathy. She does have concurrent HIV and recently stopped HIV treatment which could have precipitated. Her CD4 count is consistently <200 and viral load off meds >10K which is a risk factor for HIV associated peripheral neuropathy. Ordered ABI to assess circulation.

## 2021-03-07 NOTE — Assessment & Plan Note (Signed)
Concerning for stroke and ordered MRI brain stat as well as labs to assess for changes.

## 2021-03-07 NOTE — Patient Instructions (Signed)
We will check the labs and the blood flow for the legs.   We will get a scan of the brain.

## 2021-03-08 ENCOUNTER — Other Ambulatory Visit: Payer: BC Managed Care – PPO

## 2021-03-08 ENCOUNTER — Ambulatory Visit (HOSPITAL_COMMUNITY)
Admission: RE | Admit: 2021-03-08 | Discharge: 2021-03-08 | Disposition: A | Discharge status: Home / Self Care | Payer: BC Managed Care – PPO | Source: Ambulatory Visit | Attending: Cardiology | Admitting: Cardiology

## 2021-03-08 ENCOUNTER — Other Ambulatory Visit: Payer: Self-pay | Admitting: Internal Medicine

## 2021-03-08 ENCOUNTER — Encounter: Payer: Self-pay | Admitting: Internal Medicine

## 2021-03-08 DIAGNOSIS — R200 Anesthesia of skin: Secondary | ICD-10-CM

## 2021-03-08 DIAGNOSIS — R29898 Other symptoms and signs involving the musculoskeletal system: Secondary | ICD-10-CM | POA: Diagnosis not present

## 2021-03-08 DIAGNOSIS — R202 Paresthesia of skin: Secondary | ICD-10-CM

## 2021-03-08 LAB — HEPATITIS C ANTIBODY
Hepatitis C Ab: NONREACTIVE
SIGNAL TO CUT-OFF: 0.04 (ref <1.00)

## 2021-03-08 LAB — RPR: RPR Ser Ql: NONREACTIVE

## 2021-03-09 ENCOUNTER — Encounter: Payer: Self-pay | Admitting: Internal Medicine

## 2021-03-15 ENCOUNTER — Telehealth: Payer: Self-pay

## 2021-03-15 ENCOUNTER — Ambulatory Visit: Payer: BC Managed Care – PPO

## 2021-03-15 ENCOUNTER — Encounter (HOSPITAL_COMMUNITY): Payer: BC Managed Care – PPO

## 2021-03-15 NOTE — Telephone Encounter (Signed)
Original order for B12 on 01/02/21: "Your B12 level is very low and you would benefit from either taking an oral B12 daily or doing B12 shots every 2 weeks for two months then switch to oral at the office." Pt began b12 injections 3/10 & have had the appropriate amt.  Pt notified that her appt will be canceled today.  Instructions given to start B12 1043mcg (100mg ) daily by mouth.  Pt verb understanding.  B12 injection appt canceled for today.

## 2021-03-23 ENCOUNTER — Encounter: Payer: Self-pay | Admitting: Internal Medicine

## 2021-03-28 ENCOUNTER — Telehealth: Payer: BC Managed Care – PPO | Admitting: Internal Medicine

## 2021-03-29 ENCOUNTER — Encounter: Payer: Self-pay | Admitting: Internal Medicine

## 2021-03-29 ENCOUNTER — Other Ambulatory Visit: Payer: Self-pay

## 2021-03-29 ENCOUNTER — Telehealth (INDEPENDENT_AMBULATORY_CARE_PROVIDER_SITE_OTHER): Payer: BC Managed Care – PPO | Admitting: Internal Medicine

## 2021-03-29 DIAGNOSIS — B20 Human immunodeficiency virus [HIV] disease: Secondary | ICD-10-CM

## 2021-03-29 NOTE — Progress Notes (Signed)
Virtual Visit via Telephone Note  I connected with Marquis Lunch on 03/29/21 at  9:00 AM EDT by telephone and verified that I am speaking with the correct person using two identifiers.  Location: Patient: Home Provider: [facility]   I discussed the limitations, risks, security and privacy concerns of performing an evaluation and management service by telephone and the availability of in person appointments. I also discussed with the patient that there may be a patient responsible charge related to this service. The patient expressed understanding and agreed to proceed.   History of Present Illness: I spoke with Stephanie Espinoza by phone today.  Although she told me that she was willing to continue taking Biktarvy when I last saw her on 02/20/2021 she now tells me that she quit Biktarvy that day.  She says she is feeling better.  She says she is less bloated, irritable and tired.  She says she is willing to consider trying an alternative regimen.   Observations/Objective: HIV 1 RNA Quant (Copies/mL)  Date Value  02/06/2021 Not Detected  12/14/2020 10,600 (H)  08/16/2020 36 (H)   CD4 T Cell Abs (/uL)  Date Value  02/06/2021 190 (L)  12/14/2020 169 (L)  08/16/2020 171 (L)    Assessment and Plan: I will review all past regimens which she has not tolerated and see her in clinic next week to discuss other options  Follow Up Instructions: Follow-up in 1 week   I discussed the assessment and treatment plan with the patient. The patient was provided an opportunity to ask questions and all were answered. The patient agreed with the plan and demonstrated an understanding of the instructions.   The patient was advised to call back or seek an in-person evaluation if the symptoms worsen or if the condition fails to improve as anticipated.  I provided 13 minutes of non-face-to-face time during this encounter.   Michel Bickers, MD

## 2021-03-30 ENCOUNTER — Ambulatory Visit (INDEPENDENT_AMBULATORY_CARE_PROVIDER_SITE_OTHER): Payer: BC Managed Care – PPO

## 2021-03-30 DIAGNOSIS — R202 Paresthesia of skin: Secondary | ICD-10-CM

## 2021-03-30 DIAGNOSIS — Z23 Encounter for immunization: Secondary | ICD-10-CM

## 2021-03-30 DIAGNOSIS — R200 Anesthesia of skin: Secondary | ICD-10-CM | POA: Diagnosis not present

## 2021-04-03 ENCOUNTER — Encounter: Payer: Self-pay | Admitting: Internal Medicine

## 2021-04-03 DIAGNOSIS — M5442 Lumbago with sciatica, left side: Secondary | ICD-10-CM

## 2021-04-03 DIAGNOSIS — R200 Anesthesia of skin: Secondary | ICD-10-CM

## 2021-04-03 DIAGNOSIS — M5441 Lumbago with sciatica, right side: Secondary | ICD-10-CM

## 2021-04-03 DIAGNOSIS — R202 Paresthesia of skin: Secondary | ICD-10-CM

## 2021-04-05 ENCOUNTER — Encounter: Payer: Self-pay | Admitting: Internal Medicine

## 2021-04-05 ENCOUNTER — Other Ambulatory Visit: Payer: Self-pay

## 2021-04-05 ENCOUNTER — Ambulatory Visit (INDEPENDENT_AMBULATORY_CARE_PROVIDER_SITE_OTHER): Payer: BC Managed Care – PPO | Admitting: Internal Medicine

## 2021-04-05 DIAGNOSIS — B20 Human immunodeficiency virus [HIV] disease: Secondary | ICD-10-CM | POA: Diagnosis not present

## 2021-04-05 MED ORDER — DOVATO 50-300 MG PO TABS: 1.0000 | ORAL_TABLET | Freq: Every day | ORAL | 11 refills | Status: DC

## 2021-04-05 NOTE — Progress Notes (Signed)
Patient Active Problem List   Diagnosis Date Noted  . Human immunodeficiency virus (HIV) disease ([place]) 09/06/2006    Priority: High  . GENITAL HERPES 09/06/2006    Priority: Medium  . Weakness of both legs 03/07/2021  . B12 deficiency 03/07/2021  . COVID 11/09/2020  . Urinary incontinence 05/16/2020  . Other fatigue 05/16/2020  . History of syphilis 05/14/2020  . Memory loss 02/14/2020  . Insomnia 02/14/2020  . Herpes zoster 12/31/2019  . Normocytic anemia 03/01/2019  . Hyperglycemia 03/01/2019  . Obesity (BMI 30.0-34.9) 03/01/2019  . Headache 06/30/2018  . Urinary frequency 05/01/2018  . Sciatica 05/26/2017  . Numbness and tingling of both legs 05/10/2017  . Cough 11/05/2016  . 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 Low back pain 06/12/2016  . Essential hypertension 09/16/2014  . 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 Benign paroxysmal positional vertigo 09/21/2012  . Sinusitis 03/22/2009  . Depression 09/06/2006  . PAP SMEAR, ABNORMAL 09/06/2006    Patient's Medications  New Prescriptions   DOLUTEGRAVIR-LAMIVUDINE (DOVATO) 50-300 MG TABS    Take 1 tablet by mouth daily.  Previous Medications   ACYCLOVIR OINTMENT (ZOVIRAX) 5 %    Apply topically every 3 (three) hours.   ALBUTEROL (VENTOLIN HFA) 108 (90 BASE) MCG/ACT INHALER    Inhale 2 puffs into the lungs every 6 (six) hours as needed for wheezing or shortness of breath.   FLUTICASONE (FLONASE) 50 MCG/ACT NASAL SPRAY    Place 2 sprays into both nostrils daily.   MECLIZINE (ANTIVERT) 25 MG TABLET    Take 1 tablet (25 mg total) by mouth 3 (three) times daily as needed for dizziness.   SULFAMETHOXAZOLE-TRIMETHOPRIM (BACTRIM DS) 800-160 MG TABLET    Take 1 tablet by mouth daily.   VALACYCLOVIR (VALTREX) 500 MG TABLET    1 po q day and increase to bid x 3 days with symptoms.   VITAMIN D, ERGOCALCIFEROL, (DRISDOL) 1.25 MG (50000 UNIT) CAPS CAPSULE    TAKE 1 CAPSULE (50,000 UNITS  TOTAL) BY MOUTH EVERY 7 (SEVEN) DAYS.  Modified Medications   No medications on file  Discontinued Medications   No medications on file    Subjective: Arielys is in for her routine follow-up visit.  She stopped taking Biktarvy again following her visit here on 02/20/2021.  She has no problems tolerating her prophylactic trimethoprim sulfamethoxazole.  She is still working full-time at [employer]. She now works from home 2 days each week and in the office the other 3 days.  Over the past 2 decades she has been on multiple regimens but has always stopped taking them because she feels like she has side effects.  See her treatment history outlined below by Magda Kiel, our ID pharmacist.  "I'm sure she was on something prior to this, but the first note I could find with epic conversion is from Jan 2013.   Jan 2013 - Reyataz + Novir + Truvada  Stopped June 2014 due to weight gain, withdrawn feeling  Started Complera March 2014  "Tapered off" over several months in Fall 2015; Restarted Complera March 2016 but actually did not restart  Nothing for a good while in 2017  [place] in October 2018 (I dont think her insurance covered Biktarvy at that time)  Stopped due to headaches, fatigue, tiredness  Minh tried to start her on Tivicay + Descovy but she never started  Finally [employer] in November 2019 but had "  falling", weight gain, anxiety  Went down to 1/2 pill per day for awhile in February 2020; you asked her to stop  Restarted Biktarvy March 2021 but she stopped due to night/day sweats, fever, depression, insomnia, poor focus, fatigue  Again, did 1/2 tablet of Biktarvy in October 2021 but essentially stopped in November 2021. I talked to her regarding [place] Reap  Agreed to restart Biktarvy in February 2022  Started on January 07 2021 and had nausea, food "not tasting the same", light headache  Agreed to continue January 18 2021 but had more issues on March 25  Stopped April 25 due to  weight gain, joint pain, muscles aches, stomach pain, insomnia, gas issues, headaches fatigue, nausea, and depression   A note from 2014 also said she had been on Retrovir, Videx, Epivir, Viread, Combivir, Sustiva, Truvada, Viramune, Atripla, Kaletra, and Lexiva."    Review of Systems: Review of Systems  Constitutional: Negative for fever and weight loss.  Gastrointestinal: Negative for abdominal pain, diarrhea, nausea and vomiting.    Past Medical History:  Diagnosis Date  . Abnormal Pap smear of cervix before 1995   always a repeat back to normal except for 1 time and had colpo bioosy -resutls were normal  . Allergic rhinitis   . Anxiety   . Bell's palsy after 1995 ?   no residual SE.  [name] Depression   . Genital herpes   . History of drug abuse ([place])    drug usser [place].  cocaine  . HIV (human immunodeficiency virus infection) ([place]) 1990   from sexual contact who is unknown  . Syphilis    treated    Social History   Tobacco Use  . Smoking status: Former Smoker    Types: Cigarettes    Quit date: 03/01/1990    Years since quitting: 31.1  . Smokeless tobacco: Never Used  Vaping Use  . Vaping Use: Never used  Substance Use Topics  . Alcohol use: No  . Drug use: No    Family History  Problem Relation Age of Onset  . Diabetes Father   . Breast cancer Other     Allergies  Allergen Reactions  . Haldol [Haloperidol] Other (See Comments)    Wipes out her memory  . Nevirapine     REACTION: Rash 10/08  . Thorazine [Chlorpromazine] Other (See Comments)    "makes me comatose"    Health Maintenance  Topic Date Due  . COVID-19 Vaccine (3 - Janssen risk 3-dose series) 11/17/2020  . Fecal DNA (Cologuard)  02/07/2021  . Zoster Vaccines- Shingrix (2 of 2) 05/25/2021  . INFLUENZA VACCINE  06/04/2021  . MAMMOGRAM  08/17/2021  . PAP SMEAR-Modifier  05/10/2023  . TETANUS/TDAP  10/10/2026  . Hepatitis C Screening  Completed  . HIV Screening  Completed  . HPV  VACCINES  Aged Out    Objective:  Vitals:   04/05/21 1008  BP: 135/85  Pulse: 82  Temp: 98.4 F (36.9 C)  TempSrc: Oral  SpO2: 95%  Weight: 185 lb (83.9 kg)  Height: 5\' 2"  (1.575 m)   Body mass index is 33.84 kg/m.  Physical Exam Constitutional:      Comments: She is in good spirits as usual.  Cardiovascular:     Rate and Rhythm: Normal rate and regular rhythm.     Heart sounds: No murmur heard.   Pulmonary:     Effort: Pulmonary effort is normal.  Psychiatric:        Mood and  Affect: Mood normal.     Lab Results Lab Results  Component Value Date   WBC 4.8 03/07/2021   HGB 12.3 03/07/2021   HCT 37.6 03/07/2021   MCV 87.5 03/07/2021   PLT 224.0 03/07/2021    Lab Results  Component Value Date   CREATININE 1.14 03/07/2021   BUN 18 03/07/2021   NA 139 03/07/2021   K 4.1 03/07/2021   CL 105 03/07/2021   CO2 28 03/07/2021    Lab Results  Component Value Date   ALT 17 03/07/2021   AST 18 03/07/2021   ALKPHOS 93 03/07/2021   BILITOT 0.6 03/07/2021    Lab Results  Component Value Date   CHOL 160 12/29/2019   HDL 34.40 (L) 12/29/2019   LDLCALC 98 12/29/2019   LDLDIRECT 102.1 03/02/2009   TRIG 135.0 12/29/2019   CHOLHDL 5 12/29/2019   Lab Results  Component Value Date   LABRPR NON-REACTIVE 03/07/2021   HIV 1 RNA Quant (Copies/mL)  Date Value  02/06/2021 Not Detected  12/14/2020 10,600 (H)  08/16/2020 36 (H)   CD4 T Cell Abs (/uL)  Date Value  02/06/2021 190 (L)  12/14/2020 169 (L)  08/16/2020 171 (L)     Problem List Items Addressed This Visit      High   Human immunodeficiency virus (HIV) disease ([place])    It is exceedingly unusual to be intolerant of all antiretroviral regimens.  She is willing to try a new regimen.  A decade ago her CD4 count was normal and she was not at risk for complications when she was not taking antiretroviral therapy.  That is different now with her CD4 count below 200.  Her about treatment options and she is  agreeable to start Dovato.  She will get repeat blood work today and follow-up in 4 weeks.      Relevant Medications   Dolutegravir-lamiVUDine (DOVATO) 50-300 MG TABS   Other Relevant Orders   T-helper cell (CD4)- ([facility] clinic only)   HIV-1 RNA quant-no reflex-bld        Michel Bickers, MD [facility] for [place] [phone] pager   [phone] cell 04/05/2021, 10:23 AM

## 2021-04-05 NOTE — Assessment & Plan Note (Signed)
It is exceedingly unusual to be intolerant of all antiretroviral regimens.  She is willing to try a new regimen.  A decade ago her CD4 count was normal and she was not at risk for complications when she was not taking antiretroviral therapy.  That is different now with her CD4 count below 200.  Her about treatment options and she is agreeable to start Dovato.  She will get repeat blood work today and follow-up in 4 weeks.

## 2021-04-06 LAB — T-HELPER CELL (CD4) - (RCID CLINIC ONLY)
CD4 % Helper T Cell: 9 % — ABNORMAL LOW (ref 33–65)
CD4 T Cell Abs: 147 /uL — ABNORMAL LOW (ref 400–1790)

## 2021-04-06 MED ORDER — CYCLOBENZAPRINE HCL 5 MG PO TABS: 5.0000 mg | ORAL_TABLET | Freq: Three times a day (TID) | ORAL | 1 refills | Status: DC | PRN

## 2021-04-06 NOTE — Addendum Note (Signed)
Addended by: Pricilla Holm A on: 04/06/2021 04:20 PM   Modules accepted: Orders

## 2021-04-08 LAB — HIV-1 RNA QUANT-NO REFLEX-BLD
HIV 1 RNA Quant: 3520 Copies/mL — ABNORMAL HIGH
HIV-1 RNA Quant, Log: 3.55 Log cps/mL — ABNORMAL HIGH

## 2021-04-09 ENCOUNTER — Other Ambulatory Visit (HOSPITAL_COMMUNITY): Payer: Self-pay

## 2021-04-09 ENCOUNTER — Telehealth: Payer: Self-pay

## 2021-04-09 NOTE — Telephone Encounter (Signed)
[facility] Patient Advocate Encounter ?  ?Was successful in obtaining a Viiv copay card for Dovato.  This copay card will make the patients copay $0.00. ? ?I have spoken with the patient.   ? ? ? ? ? ? ?Venola Castello, CPhT ?Specialty Pharmacy Patient Advocate ?[facility] ?Phone: [phone] ?Fax:  [phone]  ?

## 2021-04-09 NOTE — Telephone Encounter (Signed)
I sent the patient the copay card

## 2021-04-18 ENCOUNTER — Other Ambulatory Visit: Payer: Self-pay

## 2021-04-18 DIAGNOSIS — B20 Human immunodeficiency virus [HIV] disease: Secondary | ICD-10-CM

## 2021-04-18 MED ORDER — DOVATO 50-300 MG PO TABS: 1.0000 | ORAL_TABLET | Freq: Every day | ORAL | 11 refills | Status: DC

## 2021-04-27 ENCOUNTER — Other Ambulatory Visit: Payer: Self-pay | Admitting: Neurosurgery

## 2021-04-27 DIAGNOSIS — M4317 Spondylolisthesis, lumbosacral region: Secondary | ICD-10-CM

## 2021-05-04 ENCOUNTER — Other Ambulatory Visit: Payer: Self-pay

## 2021-05-04 ENCOUNTER — Ambulatory Visit
Admission: RE | Admit: 2021-05-04 | Discharge: 2021-05-04 | Disposition: A | Discharge status: Home / Self Care | Payer: BC Managed Care – PPO | Source: Ambulatory Visit | Attending: Neurosurgery | Admitting: Neurosurgery

## 2021-05-04 DIAGNOSIS — M4317 Spondylolisthesis, lumbosacral region: Secondary | ICD-10-CM

## 2021-05-08 ENCOUNTER — Encounter: Payer: Self-pay | Admitting: Internal Medicine

## 2021-05-08 ENCOUNTER — Other Ambulatory Visit: Payer: Self-pay

## 2021-05-08 ENCOUNTER — Ambulatory Visit: Payer: BC Managed Care – PPO

## 2021-05-08 ENCOUNTER — Ambulatory Visit (INDEPENDENT_AMBULATORY_CARE_PROVIDER_SITE_OTHER): Payer: BC Managed Care – PPO | Admitting: Internal Medicine

## 2021-05-08 DIAGNOSIS — B20 Human immunodeficiency virus [HIV] disease: Secondary | ICD-10-CM | POA: Diagnosis not present

## 2021-05-08 NOTE — Assessment & Plan Note (Signed)
So far, she is tolerating Dovato well and willing to keep taking it.  She will get repeat lab work today and have a phone follow-up visit in 2 weeks.

## 2021-05-08 NOTE — Progress Notes (Signed)
Patient Active Problem List   Diagnosis Date Noted   Human immunodeficiency virus (HIV) disease ([place]) 09/06/2006    Priority: High   GENITAL HERPES 09/06/2006    Priority: Medium   Weakness of both legs 03/07/2021   B12 deficiency 03/07/2021   COVID 11/09/2020   Urinary incontinence 05/16/2020   Other fatigue 05/16/2020   History of syphilis 05/14/2020   Memory loss 02/14/2020   Insomnia 02/14/2020   Herpes zoster 12/31/2019   Normocytic anemia 03/01/2019   Hyperglycemia 03/01/2019   Obesity (BMI 30.0-34.9) 03/01/2019   Headache 06/30/2018   Urinary frequency 05/01/2018   Sciatica 05/26/2017   Numbness and tingling of both legs 05/10/2017   Cough 11/05/2016   Cervical 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 (HPV) DNA test positive 10/18/2016   Low back pain 06/12/2016   Essential hypertension 09/16/2014   Vasomotor rhinitis 05/05/2014   Benign paroxysmal positional vertigo 09/21/2012   Sinusitis 03/22/2009   Depression 09/06/2006   PAP SMEAR, ABNORMAL 09/06/2006    Patient's Medications  New Prescriptions   No medications on file  Previous Medications   ACYCLOVIR OINTMENT (ZOVIRAX) 5 %    Apply topically every 3 (three) hours.   ALBUTEROL (VENTOLIN HFA) 108 (90 BASE) MCG/ACT INHALER    Inhale 2 puffs into the lungs every 6 (six) hours as needed for wheezing or shortness of breath.   CYCLOBENZAPRINE (FLEXERIL) 5 MG TABLET    Take 1 tablet (5 mg total) by mouth 3 (three) times daily as needed for muscle spasms.   DOLUTEGRAVIR-LAMIVUDINE (DOVATO) 50-300 MG TABS    Take 1 tablet by mouth daily.   FLUTICASONE (FLONASE) 50 MCG/ACT NASAL SPRAY    Place 2 sprays into both nostrils daily.   MECLIZINE (ANTIVERT) 25 MG TABLET    Take 1 tablet (25 mg total) by mouth 3 (three) times daily as needed for dizziness.   SULFAMETHOXAZOLE-TRIMETHOPRIM (BACTRIM DS) 800-160 MG TABLET    Take 1 tablet by mouth daily.   VALACYCLOVIR (VALTREX) 500 MG TABLET    1 po q day and increase to bid  x 3 days with symptoms.   VITAMIN D, ERGOCALCIFEROL, (DRISDOL) 1.25 MG (50000 UNIT) CAPS CAPSULE    TAKE 1 CAPSULE (50,000 UNITS TOTAL) BY MOUTH EVERY 7 (SEVEN) DAYS.  Modified Medications   No medications on file  Discontinued Medications   No medications on file    Subjective: Stephanie Espinoza is in for her routine HIV follow-up visit.  She has had a great deal of difficulty tolerating multiple different antiretroviral regimens over the past several decades.  She stopped taking Biktarvy recently because of perceived side effects.  She has had slow decline in her CD4 count and has her viral load has repeatedly reactivated off of treatment.  She started on Dovato 4 weeks ago.  She tells me "so far so good".  She still has headaches and fatigue but says that it has not been as bad recently.  She has not missed doses of Dovato.  Review of Systems: Review of Systems  Constitutional:  Positive for malaise/fatigue. Negative for fever and weight loss.  Neurological:  Positive for headaches.   Past Medical History:  Diagnosis Date   Abnormal Pap smear of cervix before 1995   always a repeat back to normal except for 1 time and had colpo bioosy -resutls were normal   Allergic rhinitis    Anxiety    Bell's palsy after 1995 ?   no residual  SE.   Depression    Genital herpes    History of drug abuse ([place])    drug usser [place].  cocaine   HIV (human immunodeficiency virus infection) ([place]) 1990   from sexual contact who is unknown   Syphilis    treated    Social History   Tobacco Use   Smoking status: Former    Pack years: 0.00    Types: Cigarettes    Quit date: 03/01/1990    Years since quitting: 31.2   Smokeless tobacco: Never  Vaping Use   Vaping Use: Never used  Substance Use Topics   Alcohol use: No   Drug use: No    Family History  Problem Relation Age of Onset   Diabetes Father    Breast cancer Other     Allergies  Allergen Reactions   Haldol [Haloperidol] Other (See  Comments)    Wipes out her memory   Nevirapine     REACTION: Rash 10/08   Thorazine [Chlorpromazine] Other (See Comments)    "makes me comatose"    Health Maintenance  Topic Date Due   Pneumococcal Vaccine 52-41 Years old (1 - PCV) Never done   COVID-19 Vaccine (3 - Janssen risk series) 11/17/2020   Fecal DNA (Cologuard)  02/07/2021   Zoster Vaccines- Shingrix (2 of 2) 05/25/2021   INFLUENZA VACCINE  06/04/2021   MAMMOGRAM  08/17/2021   PAP SMEAR-Modifier  05/10/2023   TETANUS/TDAP  10/10/2026   Hepatitis C Screening  Completed   HIV Screening  Completed   HPV VACCINES  Aged Out    Objective:  Vitals:   05/08/21 1504  BP: (!) 147/102  Pulse: 82  Temp: 98.3 F (36.8 C)  TempSrc: Rectal  SpO2: 95%  Weight: 188 lb (85.3 kg)  Height: 5\' 2"  (1.575 m)   Body mass index is 34.39 kg/m.  Physical Exam Constitutional:      Comments: She is in good spirits.  Cardiovascular:     Rate and Rhythm: Normal rate.  Pulmonary:     Effort: Pulmonary effort is normal.  Psychiatric:        Mood and Affect: Mood normal.    Lab Results Lab Results  Component Value Date   WBC 4.8 03/07/2021   HGB 12.3 03/07/2021   HCT 37.6 03/07/2021   MCV 87.5 03/07/2021   PLT 224.0 03/07/2021    Lab Results  Component Value Date   CREATININE 1.14 03/07/2021   BUN 18 03/07/2021   NA 139 03/07/2021   K 4.1 03/07/2021   CL 105 03/07/2021   CO2 28 03/07/2021    Lab Results  Component Value Date   ALT 17 03/07/2021   AST 18 03/07/2021   ALKPHOS 93 03/07/2021   BILITOT 0.6 03/07/2021    Lab Results  Component Value Date   CHOL 160 12/29/2019   HDL 34.40 (L) 12/29/2019   LDLCALC 98 12/29/2019   LDLDIRECT 102.1 03/02/2009   TRIG 135.0 12/29/2019   CHOLHDL 5 12/29/2019   Lab Results  Component Value Date   LABRPR NON-REACTIVE 03/07/2021   HIV 1 RNA Quant (Copies/mL)  Date Value  04/05/2021 3,520 (H)  02/06/2021 Not Detected  12/14/2020 10,600 (H)   CD4 T Cell Abs (/uL)   Date Value  04/05/2021 147 (L)  02/06/2021 190 (L)  12/14/2020 169 (L)     Problem List Items Addressed This Visit       High   Human immunodeficiency virus (HIV) disease ([place])  So far, she is tolerating Dovato well and willing to keep taking it.  She will get repeat lab work today and have a phone follow-up visit in 2 weeks.       Relevant Orders   T-helper cell (CD4)- ([facility] clinic only)   HIV-1 RNA quant-no reflex-bld      Michel Bickers, MD [facility] for [facility] [phone] pager   [phone] cell 05/08/2021, 3:48 PM

## 2021-05-09 LAB — T-HELPER CELL (CD4) - (RCID CLINIC ONLY)
CD4 % Helper T Cell: 10 % — ABNORMAL LOW (ref 33–65)
CD4 T Cell Abs: 273 /uL — ABNORMAL LOW (ref 400–1790)

## 2021-05-10 LAB — HIV-1 RNA QUANT-NO REFLEX-BLD
HIV 1 RNA Quant: <20 Copies/mL — ABNORMAL HIGH
HIV-1 RNA Quant, Log: <1.3 Log cps/mL — ABNORMAL HIGH

## 2021-05-15 ENCOUNTER — Encounter: Payer: Self-pay | Admitting: Internal Medicine

## 2021-05-16 NOTE — Telephone Encounter (Signed)
Pt notified that PCP would like her to come in sooner to have tingling in arms evaluated.  Pt appreciative.  Appt made with Dr Mitchel Honour on 7/14.  Pt denies covid symptoms or recent known exposure to covid.

## 2021-05-17 ENCOUNTER — Encounter: Payer: Self-pay | Admitting: Emergency Medicine

## 2021-05-17 ENCOUNTER — Ambulatory Visit: Payer: BC Managed Care – PPO | Admitting: Emergency Medicine

## 2021-05-17 ENCOUNTER — Other Ambulatory Visit: Payer: Self-pay

## 2021-05-17 VITALS — BP 152/90 | HR 85 | Temp 98.3°F | Ht 62.0 in | Wt 190.0 lb

## 2021-05-17 DIAGNOSIS — R202 Paresthesia of skin: Secondary | ICD-10-CM

## 2021-05-17 NOTE — Progress Notes (Signed)
Stephanie Espinoza 65 y.o.   Chief Complaint  Patient presents with   arm tingling    Pt states that she feels numbness and tingle at times in both arms. X 10 days    HISTORY OF PRESENT ILLNESS: This is a 65 y.o. female complaining of tingling and numbness to both arms started about 10 days ago. 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  HPI   Prior to Admission medications   Medication Sig Start Date End Date Taking? Authorizing Provider  acyclovir ointment (ZOVIRAX) 5 % Apply topically every 3 (three) hours. 05/09/20  Yes Megan Salon, MD  albuterol (VENTOLIN HFA) 108 (90 Base) MCG/ACT inhaler Inhale 2 puffs into the lungs every 6 (six) hours as needed for wheezing or shortness of breath. 05/16/20  Yes Hoyt Koch, MD  cyclobenzaprine (FLEXERIL) 5 MG tablet Take 1 tablet (5 mg total) by mouth 3 (three) times daily as needed for muscle spasms. 04/06/21  Yes Hoyt Koch, MD  Dolutegravir-lamiVUDine (DOVATO) 50-300 MG TABS Take 1 tablet by mouth daily. 04/18/21  Yes Michel Bickers, MD  fluticasone [facility]) 50 MCG/ACT nasal spray Place 2 sprays into both nostrils daily. 03/01/21  Yes Hoyt Koch, MD  meclizine (ANTIVERT) 25 MG tablet Take 1 tablet (25 mg total) by mouth 3 (three) times daily as needed for dizziness. 12/27/20  Yes Marrian Salvage, FNP  sulfamethoxazole-trimethoprim (BACTRIM DS) 800-160 MG tablet Take 1 tablet by mouth daily. 09/26/20  Yes Michel Bickers, MD  valACYclovir (VALTREX) 500 MG tablet 1 po q day and increase to bid x 3 days with symptoms. 05/09/20  Yes Megan Salon, MD  Vitamin D, Ergocalciferol, (DRISDOL) 1.25 MG (50000 UNIT) CAPS capsule TAKE 1 CAPSULE (50,000 UNITS TOTAL) BY MOUTH EVERY 7 (SEVEN) DAYS. 10/17/20  Yes Hoyt Koch, MD    Allergies  Allergen Reactions   Haldol [Haloperidol] Other (See Comments)    Wipes out her memory   Nevirapine      REACTION: Rash 10/08   Thorazine [Chlorpromazine] Other (See Comments)    "makes me comatose"    Patient Active Problem List   Diagnosis Date Noted   Weakness of both legs 03/07/2021   B12 deficiency 03/07/2021   COVID 11/09/2020   Urinary incontinence 05/16/2020   Other fatigue 05/16/2020   History of syphilis 05/14/2020   Memory loss 02/14/2020   Insomnia 02/14/2020   Herpes zoster 12/31/2019   Normocytic anemia 03/01/2019   Hyperglycemia 03/01/2019   Obesity (BMI 30.0-34.9) 03/01/2019   Headache 06/30/2018   Urinary frequency 05/01/2018   Sciatica 05/26/2017   Numbness and tingling of both legs 05/10/2017   Cough 11/05/2016   Cervical 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 (HPV) DNA test positive 10/18/2016   Low back pain 06/12/2016   Essential hypertension 09/16/2014   Vasomotor rhinitis 05/05/2014   Benign paroxysmal positional vertigo 09/21/2012   Sinusitis 03/22/2009   Human immunodeficiency virus (HIV) disease ([place]) 09/06/2006   GENITAL HERPES 09/06/2006   Depression 09/06/2006   PAP SMEAR, ABNORMAL 09/06/2006    Past Medical History:  Diagnosis Date   Abnormal Pap smear of cervix before 1995   always a repeat back to normal except for 1 time and had colpo bioosy -resutls were normal   Allergic rhinitis    Anxiety    Bell's palsy after 1995 ?   no residual SE.   Depression    Genital herpes  History of drug abuse ([place])    drug usser [place].  cocaine   HIV (human immunodeficiency virus infection) ([place]) 1990   from sexual contact who is unknown   Syphilis    treated    Past Surgical History:  Procedure Laterality Date   CESAREAN SECTION  1997   ESOPHAGOGASTRODUODENOSCOPY (EGD) WITH PROPOFOL N/A 03/02/2019   Procedure: ESOPHAGOGASTRODUODENOSCOPY (EGD) WITH PROPOFOL;  Surgeon: Arta Silence, MD;  Location: [facility];  Service: Endoscopy;  Laterality: N/A;   EUS N/A 03/02/2019   Procedure: FULL UPPER ENDOSCOPIC ULTRASOUND (EUS) RADIAL;  Surgeon:  Arta Silence, MD;  Location: [facility];  Service: Endoscopy;  Laterality: N/A;   LAPAROSCOPIC CHOLECYSTECTOMY SINGLE PORT N/A 03/01/2019   Procedure: LAPAROSCOPIC CHOLECYSTECTOMY SINGLE SITE/ INTRAOPERATIVE CHOLANGIOGRAM;  Surgeon: Michael Boston, MD;  Location: [facility] ORS;  Service: General;  Laterality: N/A;    Social History   Socioeconomic History   Marital status: Divorced    Spouse name: Not on file   Number of children: Not on file   Years of education: Not on file   Highest education level: Not on file  Occupational History   Not on file  Tobacco Use   Smoking status: Former    Types: Cigarettes    Quit date: 03/01/1990    Years since quitting: 31.2   Smokeless tobacco: Never  Vaping Use   Vaping Use: Never used  Substance and Sexual Activity   Alcohol use: No   Drug use: No   Sexual activity: Not Currently    Partners: Male    Birth control/protection: Post-menopausal  Other Topics Concern   Not on file  Social History Narrative   Not on file   Social Determinants of Health   Financial Resource Strain: Not on file  Food Insecurity: Not on file  Transportation Needs: Not on file  Physical Activity: Not on file  Stress: Not on file  Social Connections: Not on file  Intimate Partner Violence: Not on file    Family History  Problem Relation Age of Onset   Diabetes Father    Breast cancer Other      Review of Systems  Constitutional: Negative.  Negative for chills and fever.  HENT: Negative.  Negative for congestion and sore throat.   Respiratory: Negative.  Negative for cough and shortness of breath.   Cardiovascular: Negative.  Negative for chest pain and palpitations.  Gastrointestinal:  Negative for abdominal pain, diarrhea, nausea and vomiting.  Genitourinary: Negative.  Negative for dysuria and hematuria.  Skin: Negative.  Negative for rash.  Neurological: Negative.  Negative for dizziness and headaches.  All other systems reviewed and are  negative. Today's Vitals   05/17/21 1531  BP: (!) 152/90  Pulse: 85  Temp: 98.3 F (36.8 C)  TempSrc: Oral  SpO2: 97%  Weight: 190 lb (86.2 kg)  Height: 5\' 2"  (1.575 m)   Body mass index is 34.75 kg/m.   Physical Exam Vitals reviewed.  Constitutional:      Appearance: Normal appearance.  HENT:     Head: Normocephalic.  Eyes:     Extraocular Movements: Extraocular movements intact.     Conjunctiva/sclera: Conjunctivae normal.     Pupils: Pupils are equal, round, and reactive to light.  Cardiovascular:     Rate and Rhythm: Normal rate and regular rhythm.     Pulses: Normal pulses.     Heart sounds: Normal heart sounds.  Pulmonary:     Effort: Pulmonary effort is normal.  Breath sounds: Normal breath sounds.  Musculoskeletal:        General: Normal range of motion.     Cervical back: Normal range of motion and neck supple.  Skin:    General: Skin is warm and dry.     Capillary Refill: Capillary refill takes less than 2 seconds.  Neurological:     General: No focal deficit present.     Mental Status: She is alert and oriented to person, place, and time.  Psychiatric:        Mood and Affect: Mood normal.        Behavior: Behavior normal.  EKG: Normal sinus rhythm with ventricular rate of 88/min.  Nonspecific T wave abnormality.  No acute ischemic changes   ASSESSMENT & PLAN: A total of 30 minutes was spent with the patient and counseling/coordination of care regarding preparing for this visit, review of most recent office visit notes, review of most recent blood work results, review of all medications, differential diagnosis of bilateral arm paresthesias, review of EKG, need for blood work, prognosis, documentation and need for follow-up. Clinically stable.  No red flag signs or symptoms.  No medical concerns identified during this visit. Will do blood work.  Needs to follow-up with PCP in 1 to 2 weeks. Stephanie Espinoza was seen today for arm tingling.  Diagnoses and all  orders for this visit:  Paresthesia of upper extremity -     CBC with Differential/Platelet; Future -     Comprehensive metabolic panel; Future -     Hemoglobin A1c; Future -     TSH; Future -     Vitamin B12; Future -     VITAMIN D 25 Hydroxy (Vit-D Deficiency, Fractures); Future  Patient Instructions  Paresthesia Paresthesia is a burning or prickling feeling. This feeling can happen in any part of the body. It often happens in the hands, arms, legs, or feet. Usually, it is not painful. In most cases, the feeling goes away in a short time and is not a sign of a serious problem. If you have paresthesia that lasts a longtime, you need to be seen by your doctor. Follow these instructions at home: Alcohol use  Do not drink alcohol if: Your doctor tells you not to drink. You are pregnant, may be pregnant, or are planning to become pregnant. If you drink alcohol: Limit how much you use to: 0-1 drink a day for women. 0-2 drinks a day for men. Be aware of how much alcohol is in your drink. In the [place] one drink equals one 12 oz bottle of beer (355 mL), one 5 oz glass of wine (148 mL), or one 1 oz glass of hard liquor (44 mL).  Nutrition  Eat a healthy diet. This includes: Eating foods that have a lot of fiber in them, such as fresh fruits and vegetables, whole grains, and beans. Limiting foods that have a lot of fat and processed sugars in them, such as fried or sweet foods.  General instructions Take over-the-counter and prescription medicines only as told by your doctor. Do not use any products that have nicotine or tobacco in them, such as cigarettes and e-cigarettes. If you need help quitting, ask your doctor. If you have diabetes, work with your doctor to make sure your blood sugar stays in a healthy range. If your feet feel numb: Check for redness, warmth, and swelling every day. Wear padded socks and comfortable shoes. These help protect your feet. Keep all follow-up visits as  told by your doctor. This is important. Contact a doctor if: You have paresthesia that gets worse or does not go away. Lose feeling (have numbness) after an injury. Your burning or prickling feeling gets worse when you walk. You have pain or cramps. You feel dizzy or pass out (faint). You have a rash. Get help right away if you: Feel weak or have new weakness in an arm or leg. Have trouble walking or moving. Have problems speaking, understanding, or seeing. Feel confused. Cannot control when you pee (urinate) or poop (have a bowel movement). Summary Paresthesia is a burning or prickling feeling. It often happens in the hands, arms, legs, or feet. In most cases, the feeling goes away in a short time and is not a sign of a serious problem. If you have paresthesia that lasts a long time, you need to be seen by your doctor. This information is not intended to replace advice given to you by your health care provider. Make sure you discuss any questions you have with your healthcare provider. Document Revised: 08/01/2020 Document Reviewed: 08/01/2020 Elsevier Patient Education  2022 [place], MD [facility]

## 2021-05-17 NOTE — Patient Instructions (Signed)
Paresthesia Paresthesia is a burning or prickling feeling. This feeling can happen in any part of the body. It often happens in the hands, arms, legs, or feet. Usually, it is not painful. In most cases, the feeling goes away in a short time and is not a sign of a serious problem. If you have paresthesia that lasts a longtime, you need to be seen by your doctor. Follow these instructions at home: Alcohol use  Do not drink alcohol if: Your doctor tells you not to drink. You are pregnant, may be pregnant, or are planning to become pregnant. If you drink alcohol: Limit how much you use to: 0-1 drink a day for women. 0-2 drinks a day for men. Be aware of how much alcohol is in your drink. In the [place] one drink equals one 12 oz bottle of beer (355 mL), one 5 oz glass of wine (148 mL), or one 1 oz glass of hard liquor (44 mL).  Nutrition  Eat a healthy diet. This includes: Eating foods that have a lot of fiber in them, such as fresh fruits and vegetables, whole grains, and beans. Limiting foods that have a lot of fat and processed sugars in them, such as fried or sweet foods.  General instructions Take over-the-counter and prescription medicines only as told by your doctor. Do not use any products that have nicotine or tobacco in them, such as cigarettes and e-cigarettes. If you need help quitting, ask your doctor. If you have diabetes, work with your doctor to make sure your blood sugar stays in a healthy range. If your feet feel numb: Check for redness, warmth, and swelling every day. Wear padded socks and comfortable shoes. These help protect your feet. Keep all follow-up visits as told by your doctor. This is important. Contact a doctor if: You have paresthesia that gets worse or does not go away. Lose feeling (have numbness) after an injury. Your burning or prickling feeling gets worse when you walk. You have pain or cramps. You feel dizzy or pass out (faint). You have a rash. Get  help right away if you: Feel weak or have new weakness in an arm or leg. Have trouble walking or moving. Have problems speaking, understanding, or seeing. Feel confused. Cannot control when you pee (urinate) or poop (have a bowel movement). Summary Paresthesia is a burning or prickling feeling. It often happens in the hands, arms, legs, or feet. In most cases, the feeling goes away in a short time and is not a sign of a serious problem. If you have paresthesia that lasts a long time, you need to be seen by your doctor. This information is not intended to replace advice given to you by your health care provider. Make sure you discuss any questions you have with your healthcare provider. Document Revised: 08/01/2020 Document Reviewed: 08/01/2020 Elsevier Patient Education  2022 Elsevier Inc.  

## 2021-05-18 ENCOUNTER — Encounter: Payer: Self-pay | Admitting: Internal Medicine

## 2021-05-18 ENCOUNTER — Encounter: Payer: Self-pay | Admitting: Emergency Medicine

## 2021-05-18 LAB — COMPREHENSIVE METABOLIC PANEL
ALT: 14 U/L (ref 0–35)
AST: 18 U/L (ref 0–37)
Albumin: 3.8 g/dL (ref 3.5–5.2)
Alkaline Phosphatase: 73 U/L (ref 39–117)
BUN: 15 mg/dL (ref 6–23)
CO2: 28 mEq/L (ref 19–32)
Calcium: 9.2 mg/dL (ref 8.4–10.5)
Chloride: 107 mEq/L (ref 96–112)
Creatinine, Ser: 1.16 mg/dL (ref 0.40–1.20)
GFR: 50.47 mL/min — ABNORMAL LOW (ref >60.00)
Glucose, Bld: 99 mg/dL (ref 70–99)
Potassium: 3.7 mEq/L (ref 3.5–5.1)
Sodium: 141 mEq/L (ref 135–145)
Total Bilirubin: 0.7 mg/dL (ref 0.2–1.2)
Total Protein: 7.6 g/dL (ref 6.0–8.3)

## 2021-05-18 LAB — VITAMIN B12: Vitamin B-12: 549 pg/mL (ref 211–911)

## 2021-05-18 LAB — CBC WITH DIFFERENTIAL/PLATELET
Basophils Absolute: 0 10*3/uL (ref 0.0–0.1)
Basophils Relative: 1 % (ref 0.0–3.0)
Eosinophils Absolute: 0.1 10*3/uL (ref 0.0–0.7)
Eosinophils Relative: 2.5 % (ref 0.0–5.0)
HCT: 35.4 % — ABNORMAL LOW (ref 36.0–46.0)
Hemoglobin: 11.7 g/dL — ABNORMAL LOW (ref 12.0–15.0)
Lymphocytes Relative: 46.7 % — ABNORMAL HIGH (ref 12.0–46.0)
Lymphs Abs: 2.1 10*3/uL (ref 0.7–4.0)
MCHC: 33.1 g/dL (ref 30.0–36.0)
MCV: 88.6 fl (ref 78.0–100.0)
Monocytes Absolute: 0.4 10*3/uL (ref 0.1–1.0)
Monocytes Relative: 8.7 % (ref 3.0–12.0)
Neutro Abs: 1.9 10*3/uL (ref 1.4–7.7)
Neutrophils Relative %: 41.1 % — ABNORMAL LOW (ref 43.0–77.0)
Platelets: 188 10*3/uL (ref 150.0–400.0)
RBC: 3.99 Mil/uL (ref 3.87–5.11)
RDW: 14.5 % (ref 11.5–15.5)
WBC: 4.5 10*3/uL (ref 4.0–10.5)

## 2021-05-18 LAB — VITAMIN D 25 HYDROXY (VIT D DEFICIENCY, FRACTURES): VITD: 60.17 ng/mL (ref 30.00–100.00)

## 2021-05-18 LAB — HEMOGLOBIN A1C: Hgb A1c MFr Bld: 5.8 % (ref 4.6–6.5)

## 2021-05-18 LAB — TSH: TSH: 2.29 u[IU]/mL (ref 0.35–5.50)

## 2021-05-18 NOTE — Addendum Note (Signed)
Addended by: Boris Lown B on: 05/18/2021 09:57 AM   Modules accepted: Orders

## 2021-05-29 ENCOUNTER — Encounter: Payer: Self-pay | Admitting: Internal Medicine

## 2021-05-29 ENCOUNTER — Ambulatory Visit: Payer: BC Managed Care – PPO | Admitting: Internal Medicine

## 2021-05-29 ENCOUNTER — Other Ambulatory Visit: Payer: Self-pay

## 2021-05-29 VITALS — BP 128/68 | HR 78 | Temp 98.5°F | Resp 18 | Ht 62.0 in | Wt 190.8 lb

## 2021-05-29 DIAGNOSIS — R202 Paresthesia of skin: Secondary | ICD-10-CM

## 2021-05-29 DIAGNOSIS — Z23 Encounter for immunization: Secondary | ICD-10-CM

## 2021-05-29 DIAGNOSIS — B20 Human immunodeficiency virus [HIV] disease: Secondary | ICD-10-CM | POA: Diagnosis not present

## 2021-05-29 DIAGNOSIS — R29898 Other symptoms and signs involving the musculoskeletal system: Secondary | ICD-10-CM

## 2021-05-29 DIAGNOSIS — R200 Anesthesia of skin: Secondary | ICD-10-CM | POA: Diagnosis not present

## 2021-05-29 NOTE — Progress Notes (Signed)
   Subjective:   Patient ID: Stephanie Espinoza, female    DOB: 02-14-1956, 65 y.o.   MRN: [id number]  HPI The patient is a 65 YO female coming in for left arm numbness and tingling. She has had this for a month or so. Previously right and left. Happening all the time. She has seen neurosurgery for the low back and they recommended that surgery would be only option but not likely a good option for her now. She is taking HIV medication dovato but feels it is not impacting her well and plans to stop taking this soon as she gave it several months. Recent CD4 count had improved on therapy. Pain in her legs is almost resolved for now.   Review of Systems  Constitutional:  Positive for fatigue.  HENT: Negative.    Eyes: Negative.   Respiratory:  Negative for cough, chest tightness and shortness of breath.   Cardiovascular:  Negative for chest pain, palpitations and leg swelling.  Gastrointestinal:  Negative for abdominal distention, abdominal pain, constipation, diarrhea, nausea and vomiting.  Musculoskeletal:  Positive for back pain and myalgias.  Skin: Negative.   Neurological:  Positive for numbness.  Psychiatric/Behavioral: Negative.     Objective:  Physical Exam Constitutional:      Appearance: She is well-developed.  HENT:     Head: Normocephalic and atraumatic.  Cardiovascular:     Rate and Rhythm: Normal rate and regular rhythm.  Pulmonary:     Effort: Pulmonary effort is normal. No respiratory distress.     Breath sounds: Normal breath sounds. No wheezing or rales.  Abdominal:     General: Bowel sounds are normal. There is no distension.     Palpations: Abdomen is soft.     Tenderness: There is no abdominal tenderness. There is no rebound.  Musculoskeletal:     Cervical back: Normal range of motion.     Comments: Pain midline cervical region, no weakness in the left or right upper extremity. Sensation is intact left and right upper and lower arm but having tingling sensation in the  left arm mostly in the forearm region during exam.   Skin:    General: Skin is warm and dry.  Neurological:     Mental Status: She is alert and oriented to person, place, and time.     Coordination: Coordination normal.    Vitals:   05/29/21 0918  BP: 128/68  Pulse: 78  Resp: 18  Temp: 98.5 F (36.9 C)  TempSrc: Oral  SpO2: 95%  Weight: 190 lb 12.8 oz (86.5 kg)  Height: '5\' 2"'$  (1.575 m)    This visit occurred during the SARS-CoV-2 public health emergency.  Safety protocols were in place, including screening questions prior to the visit, additional usage of staff PPE, and extensive cleaning of exam room while observing appropriate contact time as indicated for disinfecting solutions.   Assessment & Plan:  Prevnar 20 given at visit

## 2021-05-29 NOTE — Patient Instructions (Addendum)
I would try the carpal tunnel brace at night time for several weeks.   We have given you the shingrix vaccine today and the pneumonia shot.

## 2021-05-31 ENCOUNTER — Encounter: Payer: Self-pay | Admitting: Internal Medicine

## 2021-05-31 DIAGNOSIS — R202 Paresthesia of skin: Secondary | ICD-10-CM | POA: Insufficient documentation

## 2021-05-31 DIAGNOSIS — R200 Anesthesia of skin: Secondary | ICD-10-CM | POA: Insufficient documentation

## 2021-05-31 NOTE — Assessment & Plan Note (Signed)
This seems to have largely resolved and it makes me wonder if her HIV treatment could have impacted this or if the timing is coincidental. We will see as she plans to stop HIV treatment.

## 2021-05-31 NOTE — Assessment & Plan Note (Signed)
She has informed me that she wishes to stop dovato which she has been on for several months. Will let her ID doctor know as fyi. She did have increase in CD4 count on treatment however and we did discuss that not being on treatment likely will shorten her life. She is aware and wishes to have a quality life and not just a longer life where she is miserable with symptoms from meds.

## 2021-05-31 NOTE — Assessment & Plan Note (Signed)
Given her lumbar stenosis it is possible she has some cervical stenosis causing radiculopathy. Offered cervical x-ray however since she does not want to pursue treatment of this she declines. We also discussed the possibility of peripheral neuropathy (idiopathic or HIV associated since normal thyroid and vitamin levels recently in evaluation of LE symptoms). Advised to try carpal tunnel brace at night time to help rule this out. If she wishes further evaluation let us know.

## 2021-06-01 ENCOUNTER — Ambulatory Visit (INDEPENDENT_AMBULATORY_CARE_PROVIDER_SITE_OTHER): Payer: BC Managed Care – PPO | Admitting: *Deleted

## 2021-06-01 ENCOUNTER — Other Ambulatory Visit: Payer: Self-pay

## 2021-06-01 DIAGNOSIS — Z23 Encounter for immunization: Secondary | ICD-10-CM

## 2021-06-04 ENCOUNTER — Encounter: Payer: Self-pay | Admitting: Internal Medicine

## 2021-06-05 NOTE — Telephone Encounter (Signed)
Follow up message, seeking advice  Patient also reporting temp of 101 Pain at shingles injection site   Please call

## 2021-06-07 ENCOUNTER — Telehealth: Payer: Self-pay | Admitting: Internal Medicine

## 2021-06-07 NOTE — Telephone Encounter (Signed)
See below

## 2021-06-07 NOTE — Telephone Encounter (Signed)
Patient calling in about shingles vaccine area still being swollen & red per previous messages  Patient wants to know if theres anything else she can take or would like advice on this  Patient is willing to schedule appt to come in & be seen but she would like to exhaust all other options first  Please follow up [phone]

## 2021-06-07 NOTE — Telephone Encounter (Signed)
This will just have to run its course. Can use ice topically. Can use benadryl to help with swelling or itching. Can also take zyrtec at higher doses of 1 pill 3 times a day for next 1-2 weeks to help her reaction against this. Is she still having fevers? Can use tylenol or voltaren gel for pain. This typically resolves within 2 weeks. With her HIV her immune system may struggle more with this and could last longer.

## 2021-06-07 NOTE — Telephone Encounter (Signed)
See my chart message

## 2021-06-10 ENCOUNTER — Other Ambulatory Visit: Payer: Self-pay | Admitting: Obstetrics & Gynecology

## 2021-06-13 NOTE — Addendum Note (Signed)
Addended by: Durwin Nora on: 06/13/2021 01:41 PM   Modules accepted: Orders

## 2021-06-25 ENCOUNTER — Ambulatory Visit: Payer: BC Managed Care – PPO

## 2021-07-04 ENCOUNTER — Other Ambulatory Visit: Payer: Self-pay | Admitting: Obstetrics & Gynecology

## 2021-07-16 ENCOUNTER — Telehealth: Payer: BC Managed Care – PPO | Admitting: Internal Medicine

## 2021-07-16 ENCOUNTER — Encounter: Payer: Self-pay | Admitting: Internal Medicine

## 2021-07-16 VITALS — Ht 62.0 in

## 2021-07-17 ENCOUNTER — Telehealth (INDEPENDENT_AMBULATORY_CARE_PROVIDER_SITE_OTHER): Payer: BC Managed Care – PPO | Admitting: Internal Medicine

## 2021-07-17 ENCOUNTER — Encounter: Payer: Self-pay | Admitting: Internal Medicine

## 2021-07-17 ENCOUNTER — Other Ambulatory Visit: Payer: Self-pay | Admitting: Internal Medicine

## 2021-07-17 DIAGNOSIS — J0111 Acute recurrent frontal sinusitis: Secondary | ICD-10-CM

## 2021-07-17 MED ORDER — AMOXICILLIN-POT CLAVULANATE 875-125 MG PO TABS
1.0000 | ORAL_TABLET | Freq: Two times a day (BID) | ORAL | 0 refills | Status: DC
Start: 2021-07-17 — End: 2021-07-23

## 2021-07-17 NOTE — Assessment & Plan Note (Signed)
Rx augmentin for sinus infection which she does well with. She is immune compromised due to HIV infection which is why she requires treatment with antibiotics earlier in course.

## 2021-07-17 NOTE — Progress Notes (Signed)
Virtual Visit via Video Note  I connected with Stephanie Espinoza on 07/17/21 at  8:00 AM EDT by a video enabled telemedicine application and verified that I am speaking with the correct person using two identifiers.  The patient and the provider were at separate locations throughout the entire encounter. Patient location: home, Provider location: work   I discussed the limitations of evaluation and management by telemedicine and the availability of in person appointments. The patient expressed understanding and agreed to proceed. The patient and the provider were the only parties present for the visit unless noted in HPI below.  History of Present Illness: The patient is a 65 y.o. female with visit for possible sinus infection. Started Friday with congestion and drainage and fevers over the weekend. Has done several covid-19 tests which were negative. Does have mild cough from drainage. No SOB. No chills or muscle aches. Overall it is worsening. Has tried otc and chronically takes flonase and allergy medication which she is still taking.   Observations/Objective: Appearance: normal, breathing appears normal, casual grooming, abdomen does not appear distended, throat with drainage and redness, mental status is A and O times 3  Assessment and Plan: See problem oriented charting  Follow Up Instructions: rx augmentin for sinus infection  I discussed the assessment and treatment plan with the patient. The patient was provided an opportunity to ask questions and all were answered. The patient agreed with the plan and demonstrated an understanding of the instructions.   The patient was advised to call back or seek an in-person evaluation if the symptoms worsen or if the condition fails to improve as anticipated.  Hoyt Koch, MD

## 2021-07-20 ENCOUNTER — Other Ambulatory Visit: Payer: Self-pay | Admitting: Internal Medicine

## 2021-07-24 ENCOUNTER — Encounter: Payer: Self-pay | Admitting: Internal Medicine

## 2021-07-26 ENCOUNTER — Encounter: Payer: Self-pay | Admitting: Internal Medicine

## 2021-07-27 ENCOUNTER — Other Ambulatory Visit: Payer: Self-pay

## 2021-07-27 ENCOUNTER — Encounter: Payer: Self-pay | Admitting: Internal Medicine

## 2021-07-27 ENCOUNTER — Ambulatory Visit (INDEPENDENT_AMBULATORY_CARE_PROVIDER_SITE_OTHER): Payer: BC Managed Care – PPO | Admitting: Internal Medicine

## 2021-07-27 VITALS — BP 128/80 | HR 79 | Temp 98.8°F | Ht 62.0 in | Wt 185.8 lb

## 2021-07-27 DIAGNOSIS — T63441A Toxic effect of venom of bees, accidental (unintentional), initial encounter: Secondary | ICD-10-CM | POA: Insufficient documentation

## 2021-07-27 MED ORDER — METHYLPREDNISOLONE ACETATE 80 MG/ML IJ SUSP
80.0000 mg | Freq: Once | INTRAMUSCULAR | Status: AC
  Administered 2021-07-27: 80 mg via INTRAMUSCULAR

## 2021-07-27 MED ORDER — HYDROXYZINE PAMOATE 25 MG PO CAPS: 25.0000 mg | ORAL_CAPSULE | Freq: Three times a day (TID) | ORAL | 0 refills | Status: DC | PRN

## 2021-07-27 MED ORDER — METHYLPREDNISOLONE ACETATE 40 MG/ML IJ SUSP
40.0000 mg | Freq: Once | INTRAMUSCULAR | Status: AC
  Administered 2021-07-27: 40 mg via INTRAMUSCULAR

## 2021-07-27 NOTE — Patient Instructions (Signed)
Bee, Wasp, or [employer], Adult Bees, wasps, and hornets are part of a family of insects that can sting people. These stings can cause pain and inflammation, but they are usually not serious. However, some people may have an allergic reaction to a sting. This can cause the symptoms to be more severe. What increases the risk? You may be at a greater risk of getting stung if you: Provoke a stinging insect by swatting or disturbing it. Wear strong-smelling soaps, deodorants, or body sprays. Spend time outdoors near gardens with flowers or fruit trees or in clothes that expose skin. Eat or drink outside. What are the signs or symptoms? Common symptoms of this condition include: A red lump in the skin that sometimes has a tiny hole in the center. In some cases, a stinger may be in the center of the wound. Pain and itching at the sting site. Redness and swelling around the sting site. If you have an allergic reaction (localized allergic reaction), the swelling and redness may spread out from the sting site. In some cases, this reaction can continue to develop over the next 24-48 hours. In rare cases, a person may have a severe allergic reaction (anaphylactic reaction) to a sting. Symptoms of an anaphylactic reaction may include: Wheezing or difficulty breathing. Raised, itchy, red patches on the skin (hives). Nausea or vomiting. Abdominal cramping. Diarrhea. Tightness in the chest or chest pain. Dizziness or fainting. Redness of the face (flushing). Hoarse voice. Swollen tongue, lips, or face. How is this diagnosed? This condition is usually diagnosed based on your symptoms and medical history as well as a physical exam. You may have an allergy test to determine if you are allergic to the substance that the insect injected during the sting (venom). How is this treated? If you were stung by a bee, the stinger and a small sac of venom may be in the wound. It is important to remove the stinger as  soon as possible. You can do this by brushing across the wound with gauze, a fingernail, or a flat card such as a credit card. Removing the stinger can help reduce the severity of your body's reaction to the sting. Most stings can be treated with: Icing to reduce swelling in the area. Medicines (antihistamines) to treat itching or an allergic reaction. Medicines to help reduce pain. These may be medicines that you take by mouth, or medicated creams or lotions that you apply to your skin. Pay close attention to your symptoms after you have been stung. If possible, have someone stay with you to make sure you do not have an allergic reaction. If you have any signs of an allergic reaction, call your health care provider. If you have ever had a severe allergic reaction, your health care provider may give you an inhaler or injectable medicine (epinephrine auto-injector) to use if necessary. Follow these instructions at home:  Wash the sting site 2-3 times each day with soap and water as told by your health care provider. Apply or take over-the-counter and prescription medicines only as told by your health care provider. If directed, apply ice to the sting area. Put ice in a plastic bag. Place a towel between your skin and the bag. Leave the ice on for 20 minutes, 2-3 times a day. Do not scratch the sting area. If you had a severe allergic reaction to a sting, you may need: To wear a medical bracelet or necklace that lists the allergy. To learn when and how  an anaphylaxis kit or epinephrine injection. Your family members and coworkers may also need to learn this. To carry an anaphylaxis kit or epinephrine injection with you at all times. How is this prevented? Avoid swatting at stinging insects and disturbing insect nests. Do not use fragrant soaps or lotions. Wear shoes, pants, and long sleeves when spending time outdoors, especially in grassy areas where stinging insects are common. Keep  outdoor areas free from nests or hives. Keep food and drink containers covered when eating outdoors. Avoid working or sitting near flowering plants, if possible. Wear gloves if you are gardening or working outdoors. If an attack by a stinging insect or a swarm seems likely in the moment, move away from the area or find a barrier between you and the insect(s), such as a door. Contact a health care provider if: Your symptoms do not get better in 2-3 days. You have redness, swelling, or pain that spreads beyond the area of the sting. You have a fever. Get help right away if: You have symptoms of a severe allergic reaction. These include: Wheezing or difficulty breathing. Tightness in the chest or chest pain. Light-headedness or fainting. Itchy, raised, red patches on the skin. Nausea or vomiting. Abdominal cramping. Diarrhea. A swollen tongue or lips, or trouble swallowing. Dizziness or fainting. Summary Stings from bees, wasps, and hornets can cause pain and inflammation, but they are usually not serious. However, some people may have an allergic reaction to a sting. This can cause the symptoms to be more severe. Pay close attention to your symptoms after you have been stung. If possible, have someone stay with you to make sure you do not have an allergic reaction. Call your health care provider if you have any signs of an allergic reaction. This information is not intended to replace advice given to you by your health care provider. Make sure you discuss any questions you have with your healthcare provider. Document Revised: 08/15/2020 Document Reviewed: 08/15/2020 Elsevier Patient Education  2022 [employer] Inc.  

## 2021-07-27 NOTE — Progress Notes (Signed)
Subjective:  Patient ID: Stephanie Espinoza, female    DOB: [birth year]  Age: 65 y.o. MRN: [id number]  CC: Insect Bite (Right leg. 3 days ago. Red, hot, swelling, burning. She has tried hydrocortisone, baking soda paste. )  This visit occurred during the SARS-CoV-2 public health emergency.  Safety protocols were in place, including screening questions prior to the visit, additional usage of staff PPE, and extensive cleaning of exam room while observing appropriate contact time as indicated for disinfecting solutions.    HPI Stephanie Espinoza presents for concerns about redness and swelling around her right medial knee.  3 days ago she had the sensation that something bit her there.  She has not gotten much symptom relief with topical steroids and Benadryl.  She denies coughing, chest pain, wheezing, shortness of breath, or swelling around her mouth/eyes/lips/or tongue.  Outpatient Medications Prior to Visit  Medication Sig Dispense Refill   acyclovir ointment (ZOVIRAX) 5 % Apply topically every 3 (three) hours. 15 g 3   albuterol (VENTOLIN HFA) 108 (90 Base) MCG/ACT inhaler Inhale 2 puffs into the lungs every 6 (six) hours as needed for wheezing or shortness of breath. 18 g 2   amoxicillin-clavulanate (AUGMENTIN) 875-125 MG tablet TAKE 1 TABLET BY MOUTH TWICE A DAY FOR 2 WEEKS 28 tablet 0   cyclobenzaprine (FLEXERIL) 5 MG tablet Take 1 tablet (5 mg total) by mouth 3 (three) times daily as needed for muscle spasms. 30 tablet 1   Dolutegravir-lamiVUDine (DOVATO) 50-300 MG TABS Take 1 tablet by mouth daily. 30 tablet 11   fluticasone (FLONASE) 50 MCG/ACT nasal spray Place 2 sprays into both nostrils daily. 48 mL 2   meclizine (ANTIVERT) 25 MG tablet Take 1 tablet (25 mg total) by mouth 3 (three) times daily as needed for dizziness. 30 tablet 0   sulfamethoxazole-trimethoprim (BACTRIM DS) 800-160 MG tablet Take 1 tablet by mouth daily. 30 tablet 11   valACYclovir (VALTREX) 500 MG tablet TAKE 1 TABLET BY MOUTH  DAILY AND INCREASE TO TWICE DAILY FOR 3 DAYS WITH SYMPTOMS 30 tablet 0   Vitamin D, Ergocalciferol, (DRISDOL) 1.25 MG (50000 UNIT) CAPS capsule TAKE 1 CAPSULE (50,000 UNITS TOTAL) BY MOUTH EVERY 7 (SEVEN) DAYS 12 capsule 3   No facility-administered medications prior to visit.    ROS Review of Systems  Constitutional:  Negative for chills, diaphoresis, fatigue and fever.  HENT: Negative.    Eyes: Negative.   Respiratory:  Negative for cough, chest tightness, shortness of breath and wheezing.   Cardiovascular:  Negative for chest pain, palpitations and leg swelling.  Gastrointestinal:  Negative for abdominal pain, constipation, diarrhea, nausea and vomiting.  Endocrine: Negative.   Genitourinary: Negative.  Negative for difficulty urinating.  Musculoskeletal: Negative.  Negative for back pain.  Skin:  Positive for color change.  Neurological:  Negative for dizziness, weakness, light-headedness and headaches.  Hematological:  Negative for adenopathy. Does not bruise/bleed easily.  Psychiatric/Behavioral: Negative.     Objective:  BP 128/80   Pulse 79   Temp 98.8 F (37.1 C) (Oral)   Ht 5\' 2"  (1.575 m)   Wt 185 lb 12.8 oz (84.3 kg)   LMP 11/04/1997   SpO2 97%   BMI 33.98 kg/m   BP Readings from Last 3 Encounters:  07/27/21 128/80  05/29/21 128/68  05/17/21 (!) 152/90    Wt Readings from Last 3 Encounters:  07/27/21 185 lb 12.8 oz (84.3 kg)  05/29/21 190 lb 12.8 oz (86.5 kg)  05/17/21 190 lb (  86.2 kg)    Physical Exam Vitals reviewed.  Constitutional:      General: She is not in acute distress.    Appearance: She is not ill-appearing, toxic-appearing or diaphoretic.  HENT:     Mouth/Throat:     Mouth: Mucous membranes are moist.     Tongue: No lesions.     Pharynx: No pharyngeal swelling.  Eyes:     General:        Right eye: No discharge.     Conjunctiva/sclera: Conjunctivae normal.  Cardiovascular:     Rate and Rhythm: Normal rate and regular rhythm.      Heart sounds: No murmur heard. Pulmonary:     Effort: Pulmonary effort is normal.     Breath sounds: No stridor. No wheezing, rhonchi or rales.  Abdominal:     General: Abdomen is flat. Bowel sounds are normal. There is no distension.     Palpations: Abdomen is soft.     Tenderness: There is no abdominal tenderness.  Musculoskeletal:        General: Normal range of motion.     Cervical back: Neck supple.       Legs:  Lymphadenopathy:     Cervical: No cervical adenopathy.  Skin:    Findings: Erythema and rash present.  Neurological:     General: No focal deficit present.     Mental Status: She is alert.    Lab Results  Component Value Date   WBC 4.5 05/18/2021   HGB 11.7 (L) 05/18/2021   HCT 35.4 (L) 05/18/2021   PLT 188.0 05/18/2021   GLUCOSE 99 05/18/2021   CHOL 160 12/29/2019   TRIG 135.0 12/29/2019   HDL 34.40 (L) 12/29/2019   LDLDIRECT 102.1 03/02/2009   LDLCALC 98 12/29/2019   ALT 14 05/18/2021   AST 18 05/18/2021   NA 141 05/18/2021   K 3.7 05/18/2021   CL 107 05/18/2021   CREATININE 1.16 05/18/2021   BUN 15 05/18/2021   CO2 28 05/18/2021   TSH 2.29 05/18/2021   HGBA1C 5.8 05/18/2021    MR LUMBAR SPINE WO CONTRAST  Result Date: 05/06/2021 CLINICAL DATA:  Numbness and tingling in the legs for 4-5 months. EXAM: MRI LUMBAR SPINE WITHOUT CONTRAST TECHNIQUE: Multiplanar, multisequence MR imaging of the lumbar spine was performed. No intravenous contrast was administered. COMPARISON:  06/12/2017 and radiographs from 03/30/2021 FINDINGS: Segmentation: The lowest lumbar type non-rib-bearing vertebra is labeled as L5. Alignment: 5 mm degenerative anterolisthesis at L5-S1. This is substantially increased from 06/12/2017 but stable from 03/30/2021. Vertebrae: Disc desiccation primarily at L5-S1 and L1-2 with mild loss of intervertebral disc height. Mild degenerative facet edema bilaterally at L5-S1 Conus medullaris and cauda equina: Conus extends to the lower L2 level,  mildly abnormally low. No tethering lesion or mass is identified. The conus medullaris appears otherwise unremarkable. Paraspinal and other soft tissues: Unremarkable Disc levels: T12-L1: Unremarkable. L1-2: No impingement.  Mild disc bulge. L2-3: Unremarkable. L3-4: No impingement. Mild disc bulge. Mild degenerative facet arthropathy. L4-5: No impingement.  Mild disc bulge and mild facet arthropathy. L5-S1: Moderate to prominent left and moderate right foraminal stenosis with moderate bilateral subarticular lateral recess stenosis due to disc uncovering, left foraminal disc protrusion, mild disc bulge, and facet arthropathy. Substantially worsened compared to 06/12/2017. IMPRESSION: 1. Lumbar spondylosis and degenerative disc disease in conjunction with degenerative anterolisthesis contributes to moderate to prominent impingement at the L5-S1 level. 2. Mild disc bulges at other lumbar levels do not cause significant impingement. 3.  Mildly abnormally low conus at the lower L2 level. I do not see a tethering mass or specific abnormality along the filum terminale or cauda equina. Electronically Signed   By: Van Clines M.D.   On: 05/06/2021 11:35    Assessment & Plan:   Chryl was seen today for insect bite.  Diagnoses and all orders for this visit:  Allergic reaction to bee sting- She has a localized allergic reaction.  Will treat with systemic antihistamines and steroids.  I recommended that she keep an EpiPen available for future occurrences. -     hydrOXYzine (VISTARIL) 25 MG capsule; Take 1 capsule (25 mg total) by mouth every 8 (eight) hours as needed. -     methylPREDNISolone acetate (DEPO-MEDROL) injection 80 mg -     methylPREDNISolone acetate (DEPO-MEDROL) injection 40 mg -     EPINEPHrine (EPIPEN 2-PAK) 0.3 mg/0.3 mL IJ SOAJ injection; Inject 0.3 mg into the muscle as needed for anaphylaxis.  I am having Marquis Lunch start on hydrOXYzine and EPINEPHrine. I am also having her maintain  her acyclovir ointment, albuterol, sulfamethoxazole-trimethoprim, meclizine, fluticasone, cyclobenzaprine, Dovato, valACYclovir, amoxicillin-clavulanate, and Vitamin D (Ergocalciferol). We administered methylPREDNISolone acetate and methylPREDNISolone acetate.  Meds ordered this encounter  Medications   hydrOXYzine (VISTARIL) 25 MG capsule    Sig: Take 1 capsule (25 mg total) by mouth every 8 (eight) hours as needed.    Dispense:  30 capsule    Refill:  0   methylPREDNISolone acetate (DEPO-MEDROL) injection 80 mg   methylPREDNISolone acetate (DEPO-MEDROL) injection 40 mg   EPINEPHrine (EPIPEN 2-PAK) 0.3 mg/0.3 mL IJ SOAJ injection    Sig: Inject 0.3 mg into the muscle as needed for anaphylaxis.    Dispense:  1 each    Refill:  2     Follow-up: Return if symptoms worsen or fail to improve.  Scarlette Calico, MD

## 2021-07-28 MED ORDER — EPINEPHRINE 0.3 MG/0.3ML IJ SOAJ: 0.3000 mg | INTRAMUSCULAR | 2 refills | Status: AC | PRN

## 2021-08-02 ENCOUNTER — Ambulatory Visit (INDEPENDENT_AMBULATORY_CARE_PROVIDER_SITE_OTHER): Payer: BC Managed Care – PPO | Admitting: Obstetrics & Gynecology

## 2021-08-02 ENCOUNTER — Other Ambulatory Visit: Payer: Self-pay

## 2021-08-02 ENCOUNTER — Encounter (HOSPITAL_BASED_OUTPATIENT_CLINIC_OR_DEPARTMENT_OTHER): Payer: Self-pay | Admitting: Obstetrics & Gynecology

## 2021-08-02 ENCOUNTER — Other Ambulatory Visit (HOSPITAL_COMMUNITY)
Admission: RE | Admit: 2021-08-02 | Discharge: 2021-08-02 | Disposition: A | Discharge status: Home / Self Care | Payer: BC Managed Care – PPO | Source: Ambulatory Visit | Attending: Obstetrics & Gynecology | Admitting: Obstetrics & Gynecology

## 2021-08-02 ENCOUNTER — Ambulatory Visit (HOSPITAL_BASED_OUTPATIENT_CLINIC_OR_DEPARTMENT_OTHER)
Admission: RE | Admit: 2021-08-02 | Discharge: 2021-08-02 | Disposition: A | Discharge status: Home / Self Care | Payer: BC Managed Care – PPO | Source: Ambulatory Visit | Attending: Obstetrics & Gynecology | Admitting: Obstetrics & Gynecology

## 2021-08-02 VITALS — BP 141/101 | HR 82 | Ht 62.0 in | Wt 185.0 lb

## 2021-08-02 DIAGNOSIS — M8580 Other specified disorders of bone density and structure, unspecified site: Secondary | ICD-10-CM

## 2021-08-02 DIAGNOSIS — Z1231 Encounter for screening mammogram for malignant neoplasm of breast: Secondary | ICD-10-CM | POA: Diagnosis not present

## 2021-08-02 DIAGNOSIS — Z01419 Encounter for gynecological examination (general) (routine) without abnormal findings: Secondary | ICD-10-CM

## 2021-08-02 DIAGNOSIS — Z1211 Encounter for screening for malignant neoplasm of colon: Secondary | ICD-10-CM

## 2021-08-02 DIAGNOSIS — B009 Herpesviral infection, unspecified: Secondary | ICD-10-CM

## 2021-08-02 DIAGNOSIS — Z780 Asymptomatic menopausal state: Secondary | ICD-10-CM

## 2021-08-02 DIAGNOSIS — Z124 Encounter for screening for malignant neoplasm of cervix: Secondary | ICD-10-CM | POA: Insufficient documentation

## 2021-08-02 DIAGNOSIS — Z8619 Personal history of other infectious and parasitic diseases: Secondary | ICD-10-CM

## 2021-08-02 MED ORDER — ACYCLOVIR 5 % EX OINT: TOPICAL_OINTMENT | CUTANEOUS | 3 refills | Status: DC

## 2021-08-02 MED ORDER — VALACYCLOVIR HCL 500 MG PO TABS: 500.0000 mg | ORAL_TABLET | Freq: Every day | ORAL | 4 refills | Status: DC

## 2021-08-02 NOTE — Progress Notes (Signed)
65 y.o. [id number] Divorced   Black or [place] American Other or two or more races White or Caucasian female here for annual exam.  Denies vaginal bleeding.  Doing well.    Patient's last menstrual period was 11/04/1997.          Sexually active: No.  The current method of family planning is post menopausal status.    Exercising: occasionally  Smoker:  no  Health Maintenance: Pap:  05/09/2020 Positive HPV History of abnormal Pap:  yes MMG:  08/18/2019 Negative.  Will order to be scheduled.   Colonoscopy:  cologuard 2019 BMD:   08/18/2019, -1.2 TDaP:  2017 Screening Labs: done    reports that she quit smoking about 31 years ago. Her smoking use included cigarettes. She has never used smokeless tobacco. She reports that she does not drink alcohol and does not use drugs.  Past Medical History:  Diagnosis Date   Abnormal Pap smear of cervix before 1995   always a repeat back to normal except for 1 time and had colpo bioosy -resutls were normal   Allergic rhinitis    Anxiety    Bell's palsy after 1995 ?   no residual SE.   Depression    Genital herpes    History of drug abuse ([place])    drug usser [place].  cocaine   HIV (human immunodeficiency virus infection) ([place]) 1990   from sexual contact who is unknown   Syphilis    treated    Past Surgical History:  Procedure Laterality Date   CESAREAN SECTION  1997   ESOPHAGOGASTRODUODENOSCOPY (EGD) WITH PROPOFOL N/A 03/02/2019   Procedure: ESOPHAGOGASTRODUODENOSCOPY (EGD) WITH PROPOFOL;  Surgeon: Arta Silence, MD;  Location: [facility];  Service: Endoscopy;  Laterality: N/A;   EUS N/A 03/02/2019   Procedure: FULL UPPER ENDOSCOPIC ULTRASOUND (EUS) RADIAL;  Surgeon: Arta Silence, MD;  Location: [facility];  Service: Endoscopy;  Laterality: N/A;   LAPAROSCOPIC CHOLECYSTECTOMY SINGLE PORT N/A 03/01/2019   Procedure: LAPAROSCOPIC CHOLECYSTECTOMY SINGLE SITE/ INTRAOPERATIVE CHOLANGIOGRAM;  Surgeon: Michael Boston, MD;  Location: [facility]  ORS;  Service: General;  Laterality: N/A;    Current Outpatient Medications  Medication Sig Dispense Refill   acyclovir ointment (ZOVIRAX) 5 % Apply topically every 3 (three) hours. 15 g 3   albuterol (VENTOLIN HFA) 108 (90 Base) MCG/ACT inhaler Inhale 2 puffs into the lungs every 6 (six) hours as needed for wheezing or shortness of breath. 18 g 2   amoxicillin-clavulanate (AUGMENTIN) 875-125 MG tablet TAKE 1 TABLET BY MOUTH TWICE A DAY FOR 2 WEEKS 28 tablet 0   cyclobenzaprine (FLEXERIL) 5 MG tablet Take 1 tablet (5 mg total) by mouth 3 (three) times daily as needed for muscle spasms. 30 tablet 1   Dolutegravir-lamiVUDine (DOVATO) 50-300 MG TABS Take 1 tablet by mouth daily. 30 tablet 11   EPINEPHrine (EPIPEN 2-PAK) 0.3 mg/0.3 mL IJ SOAJ injection Inject 0.3 mg into the muscle as needed for anaphylaxis. 1 each 2   fluticasone (FLONASE) 50 MCG/ACT nasal spray Place 2 sprays into both nostrils daily. 48 mL 2   hydrOXYzine (VISTARIL) 25 MG capsule Take 1 capsule (25 mg total) by mouth every 8 (eight) hours as needed. 30 capsule 0   meclizine (ANTIVERT) 25 MG tablet Take 1 tablet (25 mg total) by mouth 3 (three) times daily as needed for dizziness. 30 tablet 0   sulfamethoxazole-trimethoprim (BACTRIM DS) 800-160 MG tablet Take 1 tablet by mouth daily. 30 tablet 11   valACYclovir (VALTREX) 500 MG  tablet TAKE 1 TABLET BY MOUTH DAILY AND INCREASE TO TWICE DAILY FOR 3 DAYS WITH SYMPTOMS 30 tablet 0   Vitamin D, Ergocalciferol, (DRISDOL) 1.25 MG (50000 UNIT) CAPS capsule TAKE 1 CAPSULE (50,000 UNITS TOTAL) BY MOUTH EVERY 7 (SEVEN) DAYS 12 capsule 3   No current facility-administered medications for this visit.    Family History  Problem Relation Age of Onset   Diabetes Father    Breast cancer Other     Review of Systems  Exam:   LMP 11/04/1997      General appearance: alert, cooperative and appears stated age Head: Normocephalic, without obvious abnormality, atraumatic Neck: no adenopathy,  supple, symmetrical, trachea midline and thyroid normal to inspection and palpation Lungs: clear to auscultation bilaterally Breasts: normal appearance, no masses or tenderness Heart: regular rate and rhythm Abdomen: soft, non-tender; bowel sounds normal; no masses,  no organomegaly Extremities: extremities normal, atraumatic, no cyanosis or edema Skin: Skin color, texture, turgor normal. No rashes or lesions Lymph nodes: Cervical, supraclavicular, and axillary nodes normal. No abnormal inguinal nodes palpated Neurologic: Grossly normal   Pelvic: External genitalia:  no lesions              Urethra:  normal appearing urethra with no masses, tenderness or lesions              Bartholins and Skenes: normal                 Vagina: normal appearing vagina with normal color and no discharge, no lesions              Cervix: no lesions              Pap taken: Yes.   Bimanual Exam:  Uterus:  normal size, contour, position, consistency, mobility, non-tender              Adnexa: normal adnexa and no mass, fullness, tenderness               Rectovaginal: Confirms               Anus:  normal sphincter tone, no lesions  Chaperone, Octaviano Batty, CMA, was present for exam.  Assessment/Plan: 1. Well woman exam with routine gynecological exam - pap and HR HPV obtained today - MMG will be done today - cologuard ordered - lab work done with Dr. Sharlet Salina - care gaps reviewed/updated  2. Postmenopausal - no HRT  3. Osteopenia, unspecified location  4. HSV (herpes simplex virus) infection - valtrex and acyclovir cream  8. History of syphilis - has been treated

## 2021-08-06 LAB — CYTOLOGY - PAP
Comment: NEGATIVE
Diagnosis: NEGATIVE
Diagnosis: REACTIVE
High risk HPV: NEGATIVE

## 2021-08-07 ENCOUNTER — Encounter (HOSPITAL_BASED_OUTPATIENT_CLINIC_OR_DEPARTMENT_OTHER): Payer: Self-pay

## 2021-08-20 LAB — COLOGUARD: Cologuard: NEGATIVE

## 2021-10-05 ENCOUNTER — Encounter: Payer: Self-pay | Admitting: Internal Medicine

## 2021-10-09 ENCOUNTER — Other Ambulatory Visit: Payer: Self-pay | Admitting: Internal Medicine

## 2021-10-09 DIAGNOSIS — B20 Human immunodeficiency virus [HIV] disease: Secondary | ICD-10-CM

## 2021-10-10 ENCOUNTER — Other Ambulatory Visit: Payer: Self-pay

## 2021-10-10 ENCOUNTER — Other Ambulatory Visit: Payer: BC Managed Care – PPO

## 2021-10-10 DIAGNOSIS — B20 Human immunodeficiency virus [HIV] disease: Secondary | ICD-10-CM

## 2021-10-10 DIAGNOSIS — Z113 Encounter for screening for infections with a predominantly sexual mode of transmission: Secondary | ICD-10-CM

## 2021-10-10 DIAGNOSIS — Z79899 Other long term (current) drug therapy: Secondary | ICD-10-CM

## 2021-10-11 ENCOUNTER — Encounter: Payer: Self-pay | Admitting: Internal Medicine

## 2021-10-11 LAB — T-HELPER CELL (CD4) - (RCID CLINIC ONLY)
CD4 % Helper T Cell: 8 % — ABNORMAL LOW (ref 33–65)
CD4 T Cell Abs: 137 /uL — ABNORMAL LOW (ref 400–1790)

## 2021-10-12 ENCOUNTER — Encounter: Payer: Self-pay | Admitting: Internal Medicine

## 2021-10-12 LAB — CBC WITH DIFFERENTIAL/PLATELET
Absolute Monocytes: 433 cells/uL (ref 200–950)
Basophils Absolute: 21 cells/uL (ref 0–200)
Basophils Relative: 0.5 %
Eosinophils Absolute: 29 cells/uL (ref 15–500)
Eosinophils Relative: 0.7 %
HCT: 36.5 % (ref 35.0–45.0)
Hemoglobin: 11.5 g/dL — ABNORMAL LOW (ref 11.7–15.5)
Lymphs Abs: 1735 cells/uL (ref 850–3900)
MCH: 27.6 pg (ref 27.0–33.0)
MCHC: 31.5 g/dL — ABNORMAL LOW (ref 32.0–36.0)
MCV: 87.7 fL (ref 80.0–100.0)
MPV: 10.5 fL (ref 7.5–12.5)
Monocytes Relative: 10.3 %
Neutro Abs: 1982 cells/uL (ref 1500–7800)
Neutrophils Relative %: 47.2 %
Platelets: 235 10*3/uL (ref 140–400)
RBC: 4.16 10*6/uL (ref 3.80–5.10)
RDW: 12.7 % (ref 11.0–15.0)
Total Lymphocyte: 41.3 %
WBC: 4.2 10*3/uL (ref 3.8–10.8)

## 2021-10-12 LAB — COMPLETE METABOLIC PANEL WITH GFR
AG Ratio: 1 (calc) (ref 1.0–2.5)
ALT: 17 U/L (ref 6–29)
AST: 20 U/L (ref 10–35)
Albumin: 3.8 g/dL (ref 3.6–5.1)
Alkaline phosphatase (APISO): 80 U/L (ref 37–153)
BUN/Creatinine Ratio: 13 (calc) (ref 6–22)
BUN: 15 mg/dL (ref 7–25)
CO2: 22 mmol/L (ref 20–32)
Calcium: 9.6 mg/dL (ref 8.6–10.4)
Chloride: 109 mmol/L (ref 98–110)
Creat: 1.2 mg/dL — ABNORMAL HIGH (ref 0.50–1.05)
Globulin: 4 g/dL (calc) — ABNORMAL HIGH (ref 1.9–3.7)
Glucose, Bld: 117 mg/dL — ABNORMAL HIGH (ref 65–99)
Potassium: 4 mmol/L (ref 3.5–5.3)
Sodium: 141 mmol/L (ref 135–146)
Total Bilirubin: 0.8 mg/dL (ref 0.2–1.2)
Total Protein: 7.8 g/dL (ref 6.1–8.1)
eGFR: 51 mL/min/{1.73_m2} — ABNORMAL LOW (ref >=60)

## 2021-10-12 LAB — LIPID PANEL
Cholesterol: 161 mg/dL (ref <200)
HDL: 37 mg/dL — ABNORMAL LOW (ref >=50)
LDL Cholesterol (Calc): 99 mg/dL (calc)
Non-HDL Cholesterol (Calc): 124 mg/dL (calc) (ref <130)
Total CHOL/HDL Ratio: 4.4 (calc) (ref <5.0)
Triglycerides: 149 mg/dL (ref <150)

## 2021-10-12 LAB — HIV-1 RNA QUANT-NO REFLEX-BLD
HIV 1 RNA Quant: 15800 Copies/mL — ABNORMAL HIGH
HIV-1 RNA Quant, Log: 4.2 Log cps/mL — ABNORMAL HIGH

## 2021-10-12 NOTE — Telephone Encounter (Signed)
Patient accepts appointment for 12/13.   Beryle Flock, RN

## 2021-10-16 ENCOUNTER — Telehealth: Payer: Self-pay

## 2021-10-16 ENCOUNTER — Encounter: Payer: Self-pay | Admitting: Internal Medicine

## 2021-10-16 ENCOUNTER — Other Ambulatory Visit: Payer: Self-pay

## 2021-10-16 ENCOUNTER — Ambulatory Visit (INDEPENDENT_AMBULATORY_CARE_PROVIDER_SITE_OTHER): Payer: BC Managed Care – PPO | Admitting: Internal Medicine

## 2021-10-16 DIAGNOSIS — B20 Human immunodeficiency virus [HIV] disease: Secondary | ICD-10-CM

## 2021-10-16 MED ORDER — SULFAMETHOXAZOLE-TRIMETHOPRIM 800-160 MG PO TABS: 1.0000 | ORAL_TABLET | Freq: Every day | ORAL | 11 refills | Status: DC

## 2021-10-16 NOTE — Progress Notes (Signed)
Patient Active Problem List   Diagnosis Date Noted   Human immunodeficiency virus (HIV) disease ([place]) 09/06/2006    Priority: High   GENITAL HERPES 09/06/2006    Priority: Medium    Allergic reaction to bee sting 07/27/2021   Numbness and tingling in left arm 05/31/2021   Weakness of both legs 03/07/2021   B12 deficiency 03/07/2021   COVID 11/09/2020   Urinary incontinence 05/16/2020   Other fatigue 05/16/2020   History of syphilis 05/14/2020   Memory loss 02/14/2020   Insomnia 02/14/2020   Herpes zoster 12/31/2019   Normocytic anemia 03/01/2019   Hyperglycemia 03/01/2019   Obesity (BMI 30.0-34.9) 03/01/2019   Headache 06/30/2018   Urinary frequency 05/01/2018   Sciatica 05/26/2017   Numbness and tingling of both legs 05/10/2017   Cough 11/05/2016   Cervical 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 (HPV) DNA test positive 10/18/2016   Low back pain 06/12/2016   Essential hypertension 09/16/2014   Vasomotor rhinitis 05/05/2014   Benign paroxysmal positional vertigo 09/21/2012   Sinusitis 03/22/2009   Depression 09/06/2006   PAP SMEAR, ABNORMAL 09/06/2006    Patient's Medications  New Prescriptions   No medications on file  Previous Medications   ACYCLOVIR OINTMENT (ZOVIRAX) 5 %    Apply topically every 3 (three) hours. Use as needed for symptoms relief for no more than 7 days.   ALBUTEROL (VENTOLIN HFA) 108 (90 BASE) MCG/ACT INHALER    Inhale 2 puffs into the lungs every 6 (six) hours as needed for wheezing or shortness of breath.   CYCLOBENZAPRINE (FLEXERIL) 5 MG TABLET    Take 1 tablet (5 mg total) by mouth 3 (three) times daily as needed for muscle spasms.   EPINEPHRINE (EPIPEN 2-PAK) 0.3 MG/0.3 ML IJ SOAJ INJECTION    Inject 0.3 mg into the muscle as needed for anaphylaxis.   FLUTICASONE (FLONASE) 50 MCG/ACT NASAL SPRAY    Place 2 sprays into both nostrils daily.   HYDROXYZINE (VISTARIL) 25 MG CAPSULE    Take 1 capsule (25 mg total) by mouth every 8 (eight) hours  as needed.   MECLIZINE (ANTIVERT) 25 MG TABLET    Take 1 tablet (25 mg total) by mouth 3 (three) times daily as needed for dizziness.   VALACYCLOVIR (VALTREX) 500 MG TABLET    Take 1 tablet (500 mg total) by mouth daily. Increase to bid x 3 days with any symptoms   VITAMIN D, ERGOCALCIFEROL, (DRISDOL) 1.25 MG (50000 UNIT) CAPS CAPSULE    TAKE 1 CAPSULE (50,000 UNITS TOTAL) BY MOUTH EVERY 7 (SEVEN) DAYS  Modified Medications   Modified Medication Previous Medication   SULFAMETHOXAZOLE-TRIMETHOPRIM (BACTRIM DS) 800-160 MG TABLET sulfamethoxazole-trimethoprim (BACTRIM DS) 800-160 MG tablet      Take 1 tablet by mouth daily.    TAKE 1 TABLET BY MOUTH EVERY DAY  Discontinued Medications   AMOXICILLIN-CLAVULANATE (AUGMENTIN) 875-125 MG TABLET    TAKE 1 TABLET BY MOUTH TWICE A DAY FOR 2 WEEKS   DOLUTEGRAVIR-LAMIVUDINE (DOVATO) 50-300 MG TABS    Take 1 tablet by mouth daily.    Subjective: Sarina is in for her routine HIV follow-up visit.  She remains off of antiretroviral therapy for now.  She continues to work full-time at [employer].  Her work is split between some days at home and some days in the office.  She is considering retiring in 1 year.  She continues to be worried about side effects of antiretroviral therapy and does not feel  like she can manage being on treatment while she is still working.  Review of Systems: Review of Systems  Constitutional:  Negative for fever and weight loss.   Past Medical History:  Diagnosis Date   Abnormal Pap smear of cervix before 1995   always a repeat back to normal except for 1 time and had colpo bioosy -resutls were normal   Allergic rhinitis    Anxiety    Bell's palsy after 1995 ?   no residual SE.   Depression    Genital herpes    History of drug abuse ([place])    drug usser [place].  cocaine   HIV (human immunodeficiency virus infection) ([place]) 1990   from sexual contact who is unknown   Syphilis    treated    Social History   Tobacco Use    Smoking status: Former    Types: Cigarettes    Quit date: 03/01/1990    Years since quitting: 31.6   Smokeless tobacco: Never  Vaping Use   Vaping Use: Never used  Substance Use Topics   Alcohol use: No   Drug use: No    Family History  Problem Relation Age of Onset   Diabetes Father    Breast cancer Other     Allergies  Allergen Reactions   Haldol [Haloperidol] Other (See Comments)    Wipes out her memory   Nevirapine     REACTION: Rash 10/08   Thorazine [Chlorpromazine] Other (See Comments)    "makes me comatose"    Health Maintenance  Topic Date Due   MAMMOGRAM  08/03/2023   PAP SMEAR-Modifier  08/02/2024   Fecal DNA (Cologuard)  08/14/2024   TETANUS/TDAP  10/10/2026   Pneumococcal Vaccine 72-34 Years old  Completed   INFLUENZA VACCINE  Completed   COVID-19 Vaccine  Completed   Hepatitis C Screening  Completed   HIV Screening  Completed   Zoster Vaccines- Shingrix  Completed   HPV VACCINES  Aged Out    Objective:  Vitals:   10/16/21 1527  BP: (!) 142/86  Pulse: 84  Temp: (!) 97.4 F (36.3 C)  TempSrc: Oral  SpO2: 98%  Weight: 178 lb (80.7 kg)   Body mass index is 32.56 kg/m.  Physical Exam Constitutional:      Comments: She is talkative and in good spirits.  Cardiovascular:     Rate and Rhythm: Normal rate.  Pulmonary:     Effort: Pulmonary effort is normal.  Psychiatric:        Mood and Affect: Mood normal.    Lab Results Lab Results  Component Value Date   WBC 4.2 10/10/2021   HGB 11.5 (L) 10/10/2021   HCT 36.5 10/10/2021   MCV 87.7 10/10/2021   PLT 235 10/10/2021    Lab Results  Component Value Date   CREATININE 1.20 (H) 10/10/2021   BUN 15 10/10/2021   NA 141 10/10/2021   K 4.0 10/10/2021   CL 109 10/10/2021   CO2 22 10/10/2021    Lab Results  Component Value Date   ALT 17 10/10/2021   AST 20 10/10/2021   ALKPHOS 73 05/18/2021   BILITOT 0.8 10/10/2021    Lab Results  Component Value Date   CHOL 161 10/10/2021   HDL  37 (L) 10/10/2021   LDLCALC 99 10/10/2021   LDLDIRECT 102.1 03/02/2009   TRIG 149 10/10/2021   CHOLHDL 4.4 10/10/2021   Lab Results  Component Value Date   LABRPR NON-REACTIVE 03/07/2021  HIV 1 RNA Quant (Copies/mL)  Date Value  10/10/2021 15,800 (H)  05/08/2021 <20 (H)  04/05/2021 3,520 (H)   CD4 T Cell Abs (/uL)  Date Value  10/10/2021 137 (L)  05/08/2021 273 (L)  04/05/2021 147 (L)     Problem List Items Addressed This Visit       High   Human immunodeficiency virus (HIV) disease ([place])    Meliss has cycled on and off antiretroviral therapy over the last few decades.  Our pharmacy staff reviewed her records today and it appears that she has been on at least 16 different regimens in the past decade.  She has never been able to stay on any regimen for very long because she feels like she does not tolerate any of the treatments.  She has had a steady downward decline of her CD4 count.  I will restart prophylactic trimethoprim/sulfamethoxazole and follow-up with her in 6 months.  She does not want to restart antiretroviral therapy at this time but says she will consider it when she retires next year.      Relevant Medications   sulfamethoxazole-trimethoprim (BACTRIM DS) 800-160 MG tablet      Michel Bickers, MD [facility] for [place] [phone] pager   [phone] cell 10/16/2021, 3:53 PM

## 2021-10-16 NOTE — Telephone Encounter (Signed)
Overview of HIV Regimens over time- many stopped due to complaints of side effects with medications  2008: Nevirapine and Truvada ~2009-2010: Kaletra and Truvada 2011-2013: Truvada + Reyataz + Norvir 2014-2016: Complera 2017: Odefsey 2018Jorje Guild 2018-2022: Biktarvy 2022- Current [place], PharmD PGY2 Infectious Diseases Pharmacy Resident

## 2021-10-16 NOTE — Assessment & Plan Note (Signed)
Robby has cycled on and off antiretroviral therapy over the last few decades.  Our pharmacy staff reviewed her records today and it appears that she has been on at least 16 different regimens in the past decade.  She has never been able to stay on any regimen for very long because she feels like she does not tolerate any of the treatments.  She has had a steady downward decline of her CD4 count.  I will restart prophylactic trimethoprim/sulfamethoxazole and follow-up with her in 6 months.  She does not want to restart antiretroviral therapy at this time but says she will consider it when she retires next year.

## 2021-11-07 ENCOUNTER — Ambulatory Visit: Payer: BC Managed Care – PPO | Admitting: Internal Medicine

## 2021-11-07 ENCOUNTER — Encounter: Payer: Self-pay | Admitting: Internal Medicine

## 2021-11-07 ENCOUNTER — Other Ambulatory Visit: Payer: Self-pay

## 2021-11-07 DIAGNOSIS — H6692 Otitis media, unspecified, left ear: Secondary | ICD-10-CM

## 2021-11-07 MED ORDER — NEOMYCIN-POLYMYXIN-HC 3.5-10000-1 OT SOLN: 3.0000 [drp] | Freq: Three times a day (TID) | OTIC | 0 refills | Status: DC

## 2021-11-07 NOTE — Patient Instructions (Signed)
We have sent in the ear drops to use 3 drops 3 times a day for 5 days in the left ear to clear up the fluid.

## 2021-11-07 NOTE — Progress Notes (Signed)
° °  Subjective:   Patient ID: Stephanie Espinoza, female    DOB: [birth year], 66 y.o.   MRN: [id number]  HPI The patient is a 66 YO female coming in for left ear pain 3 days.  Review of Systems  Constitutional:  Positive for fatigue.  HENT:  Positive for ear pain.   Eyes: Negative.   Respiratory:  Negative for cough, chest tightness and shortness of breath.   Cardiovascular:  Negative for chest pain, palpitations and leg swelling.  Gastrointestinal:  Negative for abdominal distention, abdominal pain, constipation, diarrhea, nausea and vomiting.  Musculoskeletal: Negative.   Skin: Negative.   Neurological: Negative.   Psychiatric/Behavioral: Negative.     Objective:  Physical Exam Constitutional:      Appearance: She is well-developed.  HENT:     Head: Normocephalic and atraumatic.     Right Ear: Tympanic membrane and ear canal normal.     Ears:     Comments: Left TM bulging with cloudy fluid Cardiovascular:     Rate and Rhythm: Normal rate and regular rhythm.  Pulmonary:     Effort: Pulmonary effort is normal. No respiratory distress.     Breath sounds: Normal breath sounds. No wheezing or rales.  Abdominal:     General: Bowel sounds are normal. There is no distension.     Palpations: Abdomen is soft.     Tenderness: There is no abdominal tenderness. There is no rebound.  Musculoskeletal:     Cervical back: Normal range of motion.  Skin:    General: Skin is warm and dry.  Neurological:     Mental Status: She is alert and oriented to person, place, and time.     Coordination: Coordination normal.    Vitals:   11/07/21 0850  BP: 124/82  Pulse: 81  Resp: 18  SpO2: 97%  Weight: 183 lb 6.4 oz (83.2 kg)  Height: 5\' 2"  (1.575 m)    This visit occurred during the SARS-CoV-2 public health emergency.  Safety protocols were in place, including screening questions prior to the visit, additional usage of staff PPE, and extensive cleaning of exam room while observing appropriate  contact time as indicated for disinfecting solutions.   Assessment & Plan:

## 2021-11-07 NOTE — Assessment & Plan Note (Signed)
Rx cortisporin for infection. Given immunosuppression (last CD4<150) if not improving will send in augmentin treatment. She is prone to sinus infections.

## 2021-11-15 ENCOUNTER — Encounter: Payer: Self-pay | Admitting: Internal Medicine

## 2022-01-06 ENCOUNTER — Other Ambulatory Visit: Payer: Self-pay | Admitting: Internal Medicine

## 2022-01-22 ENCOUNTER — Other Ambulatory Visit: Payer: Self-pay | Admitting: Internal Medicine

## 2022-01-24 ENCOUNTER — Encounter: Payer: Self-pay | Admitting: Internal Medicine

## 2022-01-25 ENCOUNTER — Other Ambulatory Visit: Payer: Self-pay

## 2022-01-25 ENCOUNTER — Encounter: Payer: Self-pay | Admitting: Internal Medicine

## 2022-01-25 ENCOUNTER — Telehealth (INDEPENDENT_AMBULATORY_CARE_PROVIDER_SITE_OTHER): Payer: BC Managed Care – PPO | Admitting: Internal Medicine

## 2022-01-25 DIAGNOSIS — J0111 Acute recurrent frontal sinusitis: Secondary | ICD-10-CM | POA: Diagnosis not present

## 2022-01-25 MED ORDER — DOXYCYCLINE HYCLATE 100 MG PO TABS: 100.0000 mg | ORAL_TABLET | Freq: Two times a day (BID) | ORAL | 0 refills | Status: AC

## 2022-01-25 MED ORDER — ALBUTEROL SULFATE HFA 108 (90 BASE) MCG/ACT IN AERS: 1.0000 | INHALATION_SPRAY | RESPIRATORY_TRACT | 2 refills | Status: DC | PRN

## 2022-01-25 NOTE — Progress Notes (Signed)
Virtual Visit via Video Note ? ?I connected with Marquis Lunch on 01/25/22 at 10:40 AM EDT by a video enabled telemedicine application and verified that I am speaking with the correct person using two identifiers. ? ?The patient and the provider were at separate locations throughout the entire encounter. Patient location: home, Provider location: work ?  ?I discussed the limitations of evaluation and management by telemedicine and the availability of in person appointments. The patient expressed understanding and agreed to proceed. The patient and the provider were the only parties present for the visit unless noted in HPI below. ? ?History of Present Illness: The patient is a 66 y.o. female with visit for chills sinus pain. Started 2-3 days ago. Has not been taking ppx bactrim consistently ? ?Observations/Objective: Appearance: sick, breathing appears normal, casual grooming ? ?Assessment and Plan: See problem oriented charting ? ?Follow Up Instructions: rx doxycycline for 2 weeks, asked to monitor oxygen sat levels due to inconsistent bactrim usage to monitor for PJP ? ?I discussed the assessment and treatment plan with the patient. The patient was provided an opportunity to ask questions and all were answered. The patient agreed with the plan and demonstrated an understanding of the instructions. ?  ?The patient was advised to call back or seek an in-person evaluation if the symptoms worsen or if the condition fails to improve as anticipated. ? ?Hoyt Koch, MD ? ?

## 2022-01-25 NOTE — Assessment & Plan Note (Signed)
Due to immunosuppression (last CD4 130s) and lack of bactrim adherence ppx concern could exist for PJP. She does not have any low O2 sats today but asked her to monitor this closely. She likely has early sinus infectionand due to immunosuppression needs early initiation antibiotics. Rx doxycycline 100 mg BID for 2 weeks. Would not check CD4 count until she is well as this can artificially suppress levels.  ?

## 2022-02-04 ENCOUNTER — Encounter: Payer: Self-pay | Admitting: Internal Medicine

## 2022-02-04 ENCOUNTER — Ambulatory Visit: Payer: BC Managed Care – PPO | Admitting: Internal Medicine

## 2022-02-04 ENCOUNTER — Ambulatory Visit (INDEPENDENT_AMBULATORY_CARE_PROVIDER_SITE_OTHER): Payer: BC Managed Care – PPO

## 2022-02-04 VITALS — BP 124/82 | HR 96 | Resp 18 | Ht 62.0 in | Wt 172.6 lb

## 2022-02-04 DIAGNOSIS — E538 Deficiency of other specified B group vitamins: Secondary | ICD-10-CM | POA: Diagnosis not present

## 2022-02-04 DIAGNOSIS — B20 Human immunodeficiency virus [HIV] disease: Secondary | ICD-10-CM

## 2022-02-04 DIAGNOSIS — R0602 Shortness of breath: Secondary | ICD-10-CM | POA: Diagnosis not present

## 2022-02-04 DIAGNOSIS — I10 Essential (primary) hypertension: Secondary | ICD-10-CM | POA: Diagnosis not present

## 2022-02-04 DIAGNOSIS — R202 Paresthesia of skin: Secondary | ICD-10-CM

## 2022-02-04 DIAGNOSIS — J0111 Acute recurrent frontal sinusitis: Secondary | ICD-10-CM

## 2022-02-04 DIAGNOSIS — R5383 Other fatigue: Secondary | ICD-10-CM

## 2022-02-04 DIAGNOSIS — R200 Anesthesia of skin: Secondary | ICD-10-CM

## 2022-02-04 DIAGNOSIS — R051 Acute cough: Secondary | ICD-10-CM

## 2022-02-04 LAB — CBC WITH DIFFERENTIAL/PLATELET
Basophils Absolute: 0 10*3/uL (ref 0.0–0.1)
Basophils Relative: 0.6 % (ref 0.0–3.0)
Eosinophils Absolute: 0.1 10*3/uL (ref 0.0–0.7)
Eosinophils Relative: 2 % (ref 0.0–5.0)
HCT: 35.5 % — ABNORMAL LOW (ref 36.0–46.0)
Hemoglobin: 11.6 g/dL — ABNORMAL LOW (ref 12.0–15.0)
Lymphocytes Relative: 35.5 % (ref 12.0–46.0)
Lymphs Abs: 1.9 10*3/uL (ref 0.7–4.0)
MCHC: 32.7 g/dL (ref 30.0–36.0)
MCV: 85.7 fl (ref 78.0–100.0)
Monocytes Absolute: 0.5 10*3/uL (ref 0.1–1.0)
Monocytes Relative: 10.4 % (ref 3.0–12.0)
Neutro Abs: 2.7 10*3/uL (ref 1.4–7.7)
Neutrophils Relative %: 51.5 % (ref 43.0–77.0)
Platelets: 238 10*3/uL (ref 150.0–400.0)
RBC: 4.14 Mil/uL (ref 3.87–5.11)
RDW: 13.7 % (ref 11.5–15.5)
WBC: 5.3 10*3/uL (ref 4.0–10.5)

## 2022-02-04 LAB — COMPREHENSIVE METABOLIC PANEL
ALT: 18 U/L (ref 0–35)
AST: 19 U/L (ref 0–37)
Albumin: 3.8 g/dL (ref 3.5–5.2)
Alkaline Phosphatase: 108 U/L (ref 39–117)
BUN: 15 mg/dL (ref 6–23)
CO2: 26 mEq/L (ref 19–32)
Calcium: 9.7 mg/dL (ref 8.4–10.5)
Chloride: 104 mEq/L (ref 96–112)
Creatinine, Ser: 1.14 mg/dL (ref 0.40–1.20)
GFR: 51.27 mL/min — ABNORMAL LOW (ref >60.00)
Glucose, Bld: 105 mg/dL — ABNORMAL HIGH (ref 70–99)
Potassium: 3.9 mEq/L (ref 3.5–5.1)
Sodium: 138 mEq/L (ref 135–145)
Total Bilirubin: 0.7 mg/dL (ref 0.2–1.2)
Total Protein: 8.4 g/dL — ABNORMAL HIGH (ref 6.0–8.3)

## 2022-02-04 LAB — VITAMIN D 25 HYDROXY (VIT D DEFICIENCY, FRACTURES): VITD: 58.29 ng/mL (ref 30.00–100.00)

## 2022-02-04 LAB — BRAIN NATRIURETIC PEPTIDE: Pro B Natriuretic peptide (BNP): 9 pg/mL (ref 0.0–100.0)

## 2022-02-04 LAB — TSH: TSH: 3.23 u[IU]/mL (ref 0.35–5.50)

## 2022-02-04 LAB — HEMOGLOBIN A1C: Hgb A1c MFr Bld: 5.9 % (ref 4.6–6.5)

## 2022-02-04 LAB — VITAMIN B12: Vitamin B-12: 659 pg/mL (ref 211–911)

## 2022-02-04 NOTE — Progress Notes (Signed)
? ?  Subjective:  ? ?Patient ID: Stephanie Espinoza, female    DOB: Apr 27, 1956, 66 y.o.   MRN: [id number] ? ?HPI ?The patient is a 66 YO female coming in for concerns about recent sinusitis and new leg swelling ? ?Review of Systems  ?Constitutional:  Negative for activity change, appetite change, chills, fatigue, fever and unexpected weight change.  ?HENT:  Positive for congestion, postnasal drip, rhinorrhea and sinus pressure. Negative for ear discharge, ear pain, sinus pain, sneezing, sore throat, tinnitus, trouble swallowing and voice change.   ?Eyes: Negative.   ?Respiratory:  Positive for cough. Negative for chest tightness, shortness of breath and wheezing.   ?Cardiovascular:  Positive for leg swelling.  ?Gastrointestinal: Negative.   ?Musculoskeletal:  Positive for arthralgias and myalgias.  ?Neurological:  Positive for numbness.  ? ?Objective:  ?Physical Exam ?Constitutional:   ?   Appearance: She is well-developed.  ?HENT:  ?   Head: Normocephalic and atraumatic.  ?Cardiovascular:  ?   Rate and Rhythm: Normal rate and regular rhythm.  ?Pulmonary:  ?   Effort: Pulmonary effort is normal. No respiratory distress.  ?   Breath sounds: Normal breath sounds. No wheezing or rales.  ?Abdominal:  ?   General: Bowel sounds are normal. There is no distension.  ?   Palpations: Abdomen is soft.  ?   Tenderness: There is no abdominal tenderness. There is no rebound.  ?Musculoskeletal:  ?   Cervical back: Normal range of motion.  ?   Comments: Minimal swelling right ankle and leg  ?Skin: ?   General: Skin is warm and dry.  ?Neurological:  ?   Mental Status: She is alert and oriented to person, place, and time.  ?   Coordination: Coordination normal.  ? ? ?Vitals:  ? 02/04/22 0813  ?BP: 124/82  ?Pulse: 96  ?Resp: 18  ?SpO2: 99%  ?Weight: 172 lb 9.6 oz (78.3 kg)  ?Height: '5\' 2"'$  (1.575 m)  ? ? ?This visit occurred during the SARS-CoV-2 public health emergency.  Safety protocols were in place, including screening questions prior to  the visit, additional usage of staff PPE, and extensive cleaning of exam room while observing appropriate contact time as indicated for disinfecting solutions.  ? ?Assessment & Plan:  ? ?

## 2022-02-04 NOTE — Patient Instructions (Signed)
We will check the labs and the chest x-ray today. ? ? ?

## 2022-02-05 ENCOUNTER — Encounter: Payer: Self-pay | Admitting: Internal Medicine

## 2022-02-05 DIAGNOSIS — M2550 Pain in unspecified joint: Secondary | ICD-10-CM

## 2022-02-06 LAB — LYMPH ENUMERATION,HELPER/SUPPRESSOR
%CD8 (Cytotoxic/Suppressor): 79 % — ABNORMAL HIGH (ref 12–42)
Absolute CD4: 254 cells/uL — ABNORMAL LOW (ref 490–1740)
CD4 T Helper %: 11 % — ABNORMAL LOW (ref 30–61)
CD4/CD8 Ratio: 0.14 — ABNORMAL LOW (ref 0.86–5.00)
CD8 T Cell Abs: 1819 cells/uL — ABNORMAL HIGH (ref 180–1170)
Total lymphocyte count: 2291 cells/uL (ref 850–3900)

## 2022-02-06 LAB — HIV-1 RNA QUANT-NO REFLEX-BLD
HIV 1 RNA Quant: 8350 Copies/mL — ABNORMAL HIGH
HIV-1 RNA Quant, Log: 3.92 Log cps/mL — ABNORMAL HIGH

## 2022-02-07 ENCOUNTER — Encounter: Payer: Self-pay | Admitting: Internal Medicine

## 2022-02-07 NOTE — Assessment & Plan Note (Signed)
Overall resolving with doxycycline and is improved today.  ?

## 2022-02-07 NOTE — Assessment & Plan Note (Signed)
Given SOB and lack of compliance to ppx bactrim and low CD4 count needs CXR today to rule out atypical infection. If present will treat with bactrim.  ?

## 2022-02-07 NOTE — Assessment & Plan Note (Signed)
Some recurrence with swelling. May be related to recent medication. Checking TSH, B12, vitamin D, CMP, CBC, BNP to help rule out metabolic etiology.  ?

## 2022-02-07 NOTE — Assessment & Plan Note (Signed)
BP at goal on no current medications. Needs CMP today.  ?

## 2022-02-07 NOTE — Assessment & Plan Note (Signed)
Checking TSH and B12 and vitamin D and CD4 count and CBC and CMP and BNP.  ?

## 2022-02-07 NOTE — Assessment & Plan Note (Signed)
Checking HIV viral load and CD4 count. She has been skipping bactrim and did have SOB with recent illness. Needs CXR today to help rule out PJP. ?

## 2022-02-07 NOTE — Assessment & Plan Note (Signed)
Checking B12 level and adjust as needed. She is taking oral replacement.  ?

## 2022-02-11 ENCOUNTER — Ambulatory Visit: Payer: BC Managed Care – PPO | Admitting: Internal Medicine

## 2022-02-11 NOTE — Progress Notes (Signed)
? ? Stephanie Espinoza D.Merril Abbe ?[facility] ?[place] ?Phone: [phone] ?  ?Assessment and Plan:   ?  ?1. Chronic bilateral low back pain with bilateral sciatica ?2. DDD (degenerative disc disease), lumbar ?3. Anterolisthesis of lumbosacral spine ?-Chronic with exacerbation, initial sports medicine visit ?- Chronic numbness/tingling/weakness in lower extremities distal to knees that is consistent with an L5 and S1 radiculopathy, likely stemming from DDD, anterior listhesis at L5-S1 as seen on MRI from 05/2021 ?- Patient had epidural several years ago, but she is unsure of the benefit that it provided.  Patient is willing to repeat epidural and physical therapy at this time to see if it provides improvement in symptoms.  If no improvement in symptoms, would recommend referral to neurosurgery due to current weakness and wanting to prevent chronic weakness ?- Referral to physical therapy ?- Start HEP for low back ?- Referral for epidural L5-S1 on right ?- Ambulatory referral to Physical Therapy ?- DG INJECT DIAG/THERA/INC NEEDLE/CATH/PLC EPI/LUMB/SAC W/IMG; Future  ?  ?Pertinent previous records reviewed include lumbar MRI 05/04/2021 ? ?Follow Up: 2 weeks after epidural for reevaluation.If no improvement in symptoms, would recommend referral to neurosurgery due to current weakness and wanting to prevent chronic weakness ?   ? ?  ?Subjective:   ?I, Pincus Badder, am serving as a Education administrator for [employer] ? ?Chief Complaint: arthralgia  ? ?HPI:  ?02/12/2022 ?Patient is a 66 year old female complaining of arthralgia. Patient states Thurs-Sunday her legs were not working cramping really bad wasn't able to walk or do anything has a hx of sciatica, is having more flare ups of her legs not working , from the knee down to the feet gets tingly and numb, tried her exercises and inverted that helped , happened two months ago had MRI done last year went to spinal surgeon  last year, has tried tylenol does nothing takes flexeril , and aleve never really in pain its just her nerves are going haywire feet are always tingling and numb that works its way up to her knees, never know its going to come on, sometime joint in her hands will hurt  ? ?Relevant Historical Information: HIV, hypertension ? ?Additional pertinent review of systems negative. ? ? ?Current Outpatient Medications:  ?  acyclovir ointment (ZOVIRAX) 5 %, Apply topically every 3 (three) hours. Use as needed for symptoms relief for no more than 7 days., Disp: 15 g, Rfl: 3 ?  albuterol (VENTOLIN HFA) 108 (90 Base) MCG/ACT inhaler, Inhale 1-2 puffs into the lungs every 4 (four) hours as needed for wheezing or shortness of breath., Disp: 6.7 each, Rfl: 2 ?  cyclobenzaprine (FLEXERIL) 5 MG tablet, Take 1 tablet (5 mg total) by mouth 3 (three) times daily as needed for muscle spasms., Disp: 30 tablet, Rfl: 1 ?  EPINEPHrine (EPIPEN 2-PAK) 0.3 mg/0.3 mL IJ SOAJ injection, Inject 0.3 mg into the muscle as needed for anaphylaxis., Disp: 1 each, Rfl: 2 ?  fluticasone (FLONASE) 50 MCG/ACT nasal spray, Place 2 sprays into both nostrils daily., Disp: 48 mL, Rfl: 2 ?  hydrOXYzine (VISTARIL) 25 MG capsule, Take 1 capsule (25 mg total) by mouth every 8 (eight) hours as needed., Disp: 30 capsule, Rfl: 0 ?  sulfamethoxazole-trimethoprim (BACTRIM DS) 800-160 MG tablet, Take 1 tablet by mouth daily., Disp: 30 tablet, Rfl: 11 ?  valACYclovir (VALTREX) 500 MG tablet, Take 1 tablet (500 mg total) by mouth daily. Increase to bid x 3 days with  any symptoms, Disp: 90 tablet, Rfl: 4  ? ?Objective:   ?  ?Vitals:  ? 02/12/22 1026  ?BP: 126/80  ?Pulse: (!) 104  ?SpO2: 99%  ?Weight: 169 lb (76.7 kg)  ?Height: '5\' 2"'$  (1.575 m)  ?  ?  ?Body mass index is 30.91 kg/m?.  ?  ?Physical Exam:   ? ?Gen: Appears well, nad, nontoxic and pleasant ?Psych: Alert and oriented, appropriate mood and affect ?Neuro: Decreased sensation to medial and lateral leg distal to knee  consistent with L5-L1 neuropathy, otherwise sensation intact.  4/5 strength in knee extension and flexion, otherwise strength is 5/5 in upper and lower extremities, muscle tone wnl ?Skin: no susupicious lesions or rashes ? ?Back - Normal skin, Spine with normal alignment and no deformity.   ?No tenderness to vertebral process palpation.   ?Paraspinous muscles are not tender and without spasm ?Straight leg raise equivocal as patient has continuous numbness and tingling in lower extremities ?Trendelenberg positive bilaterally ? ? ?Electronically signed by:  ?Stephanie Espinoza D.Merril Abbe ?[facility] ?11:08 AM 02/12/22 ?

## 2022-02-12 ENCOUNTER — Ambulatory Visit: Payer: BC Managed Care – PPO | Admitting: Sports Medicine

## 2022-02-12 VITALS — BP 126/80 | HR 104 | Ht 62.0 in | Wt 169.0 lb

## 2022-02-12 DIAGNOSIS — M51369 Other intervertebral disc degeneration, lumbar region without mention of lumbar back pain or lower extremity pain: Secondary | ICD-10-CM

## 2022-02-12 DIAGNOSIS — M5441 Lumbago with sciatica, right side: Secondary | ICD-10-CM | POA: Diagnosis not present

## 2022-02-12 DIAGNOSIS — M4317 Spondylolisthesis, lumbosacral region: Secondary | ICD-10-CM | POA: Diagnosis not present

## 2022-02-12 DIAGNOSIS — M5136 Other intervertebral disc degeneration, lumbar region: Secondary | ICD-10-CM

## 2022-02-12 DIAGNOSIS — M5442 Lumbago with sciatica, left side: Secondary | ICD-10-CM

## 2022-02-12 DIAGNOSIS — G8929 Other chronic pain: Secondary | ICD-10-CM

## 2022-02-12 NOTE — Patient Instructions (Addendum)
Good to see you ?Epidural referral right side L5-S1  ?HEP low back  ?Pt referral  ?2 week follow up after your epidural to discuss results  ?

## 2022-02-15 ENCOUNTER — Encounter: Payer: Self-pay | Admitting: Internal Medicine

## 2022-02-15 ENCOUNTER — Ambulatory Visit (INDEPENDENT_AMBULATORY_CARE_PROVIDER_SITE_OTHER): Payer: BC Managed Care – PPO | Admitting: Internal Medicine

## 2022-02-15 ENCOUNTER — Ambulatory Visit (INDEPENDENT_AMBULATORY_CARE_PROVIDER_SITE_OTHER): Payer: BC Managed Care – PPO

## 2022-02-15 VITALS — BP 122/90 | HR 107 | Resp 18 | Ht 62.0 in | Wt 166.8 lb

## 2022-02-15 DIAGNOSIS — B20 Human immunodeficiency virus [HIV] disease: Secondary | ICD-10-CM

## 2022-02-15 DIAGNOSIS — R9389 Abnormal findings on diagnostic imaging of other specified body structures: Secondary | ICD-10-CM | POA: Diagnosis not present

## 2022-02-15 DIAGNOSIS — M5136 Other intervertebral disc degeneration, lumbar region: Secondary | ICD-10-CM

## 2022-02-15 NOTE — Assessment & Plan Note (Signed)
We discussed most recent CD4 count improved to 254 however I would recommend to maintain bactrim ppx until 2 readings stable as most previous were around 150. She is not taking HAART at this time.  ?

## 2022-02-15 NOTE — Patient Instructions (Signed)
We will do the chest x-ray today. ?

## 2022-02-15 NOTE — Assessment & Plan Note (Signed)
Getting epidural Monday and then starting with PT. Is gradually improving but this is her second recent severe flare of DDD lumbar. She may consider surgery depending on improvement.  ?

## 2022-02-15 NOTE — Assessment & Plan Note (Signed)
She has taken bactrim for atypical pneumonia in the setting of HIV low CD4 count. Most recent CD4 count is improved to 254 however she was not taking bactrim regularly previous to that. Checking CXR today for resolution of findings.  ?

## 2022-02-15 NOTE — Progress Notes (Signed)
? ?  Subjective:  ? ?Patient ID: Stephanie Espinoza, female    DOB: 10/15/1956, 66 y.o.   MRN: [id number] ? ?HPI ?The patient is a 66 YO female coming in for back pain and follow up CXR findings. Has been taking bactrim. ? ?Review of Systems  ?Constitutional: Negative.   ?HENT: Negative.    ?Eyes: Negative.   ?Respiratory:  Negative for cough, chest tightness and shortness of breath.   ?Cardiovascular:  Negative for chest pain, palpitations and leg swelling.  ?Gastrointestinal:  Negative for abdominal distention, abdominal pain, constipation, diarrhea, nausea and vomiting.  ?Musculoskeletal:  Positive for back pain.  ?Skin: Negative.   ?Neurological:  Positive for weakness and numbness.  ?Psychiatric/Behavioral: Negative.    ? ?Objective:  ?Physical Exam ?Constitutional:   ?   Appearance: She is well-developed.  ?HENT:  ?   Head: Normocephalic and atraumatic.  ?Cardiovascular:  ?   Rate and Rhythm: Normal rate and regular rhythm.  ?Pulmonary:  ?   Effort: Pulmonary effort is normal. No respiratory distress.  ?   Breath sounds: Normal breath sounds. No wheezing or rales.  ?Abdominal:  ?   General: Bowel sounds are normal. There is no distension.  ?   Palpations: Abdomen is soft.  ?   Tenderness: There is no abdominal tenderness. There is no rebound.  ?Musculoskeletal:     ?   General: Tenderness present.  ?   Cervical back: Normal range of motion.  ?Skin: ?   General: Skin is warm and dry.  ?Neurological:  ?   Mental Status: She is alert and oriented to person, place, and time.  ?   Coordination: Coordination normal.  ? ? ?Vitals:  ? 02/15/22 1551  ?BP: 122/90  ?Pulse: (!) 107  ?Resp: 18  ?SpO2: 100%  ?Weight: 166 lb 12.8 oz (75.7 kg)  ?Height: '5\' 2"'$  (1.575 m)  ? ? ?This visit occurred during the SARS-CoV-2 public health emergency.  Safety protocols were in place, including screening questions prior to the visit, additional usage of staff PPE, and extensive cleaning of exam room while observing appropriate contact time as  indicated for disinfecting solutions.  ? ?Assessment & Plan:  ? ?

## 2022-02-18 ENCOUNTER — Ambulatory Visit
Admission: RE | Admit: 2022-02-18 | Discharge: 2022-02-18 | Disposition: A | Discharge status: Home / Self Care | Payer: BC Managed Care – PPO | Source: Ambulatory Visit | Attending: Sports Medicine | Admitting: Sports Medicine

## 2022-02-18 DIAGNOSIS — M4317 Spondylolisthesis, lumbosacral region: Secondary | ICD-10-CM

## 2022-02-18 DIAGNOSIS — M5136 Other intervertebral disc degeneration, lumbar region: Secondary | ICD-10-CM

## 2022-02-18 DIAGNOSIS — G8929 Other chronic pain: Secondary | ICD-10-CM

## 2022-02-18 MED ORDER — METHYLPREDNISOLONE ACETATE 40 MG/ML INJ SUSP (RADIOLOG
80.0000 mg | Freq: Once | INTRAMUSCULAR | Status: AC
Start: 2022-02-18 — End: 2022-02-18
  Administered 2022-02-18: 80 mg via EPIDURAL

## 2022-02-18 MED ORDER — IOPAMIDOL (ISOVUE-M 200) INJECTION 41%
1.0000 mL | Freq: Once | INTRAMUSCULAR | Status: AC
  Administered 2022-02-18: 1 mL via EPIDURAL

## 2022-02-18 NOTE — Discharge Instructions (Signed)

## 2022-02-22 ENCOUNTER — Encounter: Payer: Self-pay | Admitting: Internal Medicine

## 2022-02-27 ENCOUNTER — Telehealth: Payer: Self-pay | Admitting: Internal Medicine

## 2022-02-27 NOTE — Telephone Encounter (Signed)
Type of form received - FMLA ?Form placed in - Provider mailbox  ? ?Additional instructions from the patient- Email or call when completed.  ? ?Things to remember: ?[place] office: If form received in person, remind patient that forms take 7-10 business days ?CMA should attach charge sheet and put on Supervisor's desk  ?

## 2022-02-28 NOTE — Telephone Encounter (Signed)
Can we clarify what patient is requesting from [facility] forms? ?

## 2022-03-06 NOTE — Telephone Encounter (Signed)
Called pt. LDVM at [phone] asking to her give our office a call back to discuss her FMLA. Office number was provided  ? ? ?If patient calls back please find out specifically why she is needing FMLA forms filled out.  ?

## 2022-03-09 ENCOUNTER — Encounter: Payer: Self-pay | Admitting: Internal Medicine

## 2022-03-18 ENCOUNTER — Encounter: Payer: Self-pay | Admitting: Internal Medicine

## 2022-03-18 MED ORDER — NYSTATIN 100000 UNIT/ML MT SUSP: 5.0000 mL | Freq: Four times a day (QID) | OROMUCOSAL | 0 refills | Status: DC

## 2022-03-25 ENCOUNTER — Other Ambulatory Visit: Payer: BC Managed Care – PPO

## 2022-04-12 ENCOUNTER — Encounter: Payer: Self-pay | Admitting: Sports Medicine

## 2022-04-16 ENCOUNTER — Other Ambulatory Visit: Payer: Self-pay | Admitting: Sports Medicine

## 2022-04-16 DIAGNOSIS — G8929 Other chronic pain: Secondary | ICD-10-CM

## 2022-04-16 DIAGNOSIS — M5136 Other intervertebral disc degeneration, lumbar region: Secondary | ICD-10-CM

## 2022-04-16 DIAGNOSIS — M4317 Spondylolisthesis, lumbosacral region: Secondary | ICD-10-CM

## 2022-04-16 NOTE — Telephone Encounter (Signed)
Referral for neurosurgery has been place

## 2022-04-17 ENCOUNTER — Other Ambulatory Visit: Payer: Self-pay | Admitting: Sports Medicine

## 2022-05-01 ENCOUNTER — Other Ambulatory Visit: Payer: BC Managed Care – PPO

## 2022-05-01 ENCOUNTER — Other Ambulatory Visit: Payer: Self-pay

## 2022-05-01 DIAGNOSIS — B20 Human immunodeficiency virus [HIV] disease: Secondary | ICD-10-CM

## 2022-05-02 LAB — T-HELPER CELL (CD4) - (RCID CLINIC ONLY)
CD4 % Helper T Cell: 8 % — ABNORMAL LOW (ref 33–65)
CD4 T Cell Abs: 168 /uL — ABNORMAL LOW (ref 400–1790)

## 2022-05-04 LAB — CBC WITH DIFFERENTIAL/PLATELET
Absolute Monocytes: 361 cells/uL (ref 200–950)
Basophils Absolute: 22 cells/uL (ref 0–200)
Basophils Relative: 0.5 %
Eosinophils Absolute: 60 cells/uL (ref 15–500)
Eosinophils Relative: 1.4 %
HCT: 32.2 % — ABNORMAL LOW (ref 35.0–45.0)
Hemoglobin: 10.1 g/dL — ABNORMAL LOW (ref 11.7–15.5)
Lymphs Abs: 2012 cells/uL (ref 850–3900)
MCH: 27.2 pg (ref 27.0–33.0)
MCHC: 31.4 g/dL — ABNORMAL LOW (ref 32.0–36.0)
MCV: 86.8 fL (ref 80.0–100.0)
MPV: 10.1 fL (ref 7.5–12.5)
Monocytes Relative: 8.4 %
Neutro Abs: 1845 cells/uL (ref 1500–7800)
Neutrophils Relative %: 42.9 %
Platelets: 277 10*3/uL (ref 140–400)
RBC: 3.71 10*6/uL — ABNORMAL LOW (ref 3.80–5.10)
RDW: 13.4 % (ref 11.0–15.0)
Total Lymphocyte: 46.8 %
WBC: 4.3 10*3/uL (ref 3.8–10.8)

## 2022-05-04 LAB — COMPLETE METABOLIC PANEL WITH GFR
AG Ratio: 0.9 (calc) — ABNORMAL LOW (ref 1.0–2.5)
ALT: 11 U/L (ref 6–29)
AST: 16 U/L (ref 10–35)
Albumin: 3.5 g/dL — ABNORMAL LOW (ref 3.6–5.1)
Alkaline phosphatase (APISO): 102 U/L (ref 37–153)
BUN: 12 mg/dL (ref 7–25)
CO2: 27 mmol/L (ref 20–32)
Calcium: 9.3 mg/dL (ref 8.6–10.4)
Chloride: 107 mmol/L (ref 98–110)
Creat: 0.98 mg/dL (ref 0.50–1.05)
Globulin: 3.8 g/dL (calc) — ABNORMAL HIGH (ref 1.9–3.7)
Glucose, Bld: 83 mg/dL (ref 65–99)
Potassium: 3.7 mmol/L (ref 3.5–5.3)
Sodium: 141 mmol/L (ref 135–146)
Total Bilirubin: 0.7 mg/dL (ref 0.2–1.2)
Total Protein: 7.3 g/dL (ref 6.1–8.1)
eGFR: 65 mL/min/{1.73_m2} (ref >=60)

## 2022-05-04 LAB — HIV-1 RNA QUANT-NO REFLEX-BLD
HIV 1 RNA Quant: 18600 Copies/mL — ABNORMAL HIGH
HIV-1 RNA Quant, Log: 4.27 Log cps/mL — ABNORMAL HIGH

## 2022-05-06 ENCOUNTER — Encounter: Payer: Self-pay | Admitting: Internal Medicine

## 2022-05-08 ENCOUNTER — Encounter: Payer: Self-pay | Admitting: Internal Medicine

## 2022-05-08 ENCOUNTER — Ambulatory Visit (INDEPENDENT_AMBULATORY_CARE_PROVIDER_SITE_OTHER): Payer: BC Managed Care – PPO | Admitting: Internal Medicine

## 2022-05-08 VITALS — BP 144/82 | HR 81 | Temp 98.5°F | Ht 62.0 in | Wt 164.1 lb

## 2022-05-08 DIAGNOSIS — M5416 Radiculopathy, lumbar region: Secondary | ICD-10-CM | POA: Diagnosis not present

## 2022-05-08 DIAGNOSIS — R739 Hyperglycemia, unspecified: Secondary | ICD-10-CM

## 2022-05-08 DIAGNOSIS — I10 Essential (primary) hypertension: Secondary | ICD-10-CM | POA: Diagnosis not present

## 2022-05-08 MED ORDER — HYDROCODONE-ACETAMINOPHEN 7.5-325 MG PO TABS: 1.0000 | ORAL_TABLET | Freq: Four times a day (QID) | ORAL | 0 refills | Status: DC | PRN

## 2022-05-08 MED ORDER — PREGABALIN 50 MG PO CAPS: 50.0000 mg | ORAL_CAPSULE | Freq: Two times a day (BID) | ORAL | 0 refills | Status: DC

## 2022-05-08 NOTE — Assessment & Plan Note (Signed)
With acute on subacute worsening, for lyrica 50 bid, and hydrocodone 7.5 325 prn, f/u MRI and Dr Cyndy Freeze as planned

## 2022-05-08 NOTE — Assessment & Plan Note (Signed)
Lab Results  Component Value Date   HGBA1C 5.9 02/04/2022   Stable, pt to continue current medical treatment  - diet, wt controlled, excercise

## 2022-05-08 NOTE — Patient Instructions (Signed)
Please take all new medication as prescribed - the hydrocodone and also the lyrica as prescribed  Please continue all other medications as before, and refills have been done if requested.  Please have the pharmacy call with any other refills you may need.  Please keep your appointments with your specialists as you may have planned  - Dr Cyndy Freeze and MRI

## 2022-05-08 NOTE — Assessment & Plan Note (Signed)
BP Readings from Last 3 Encounters:  05/08/22 (!) 144/82  02/18/22 126/80  02/15/22 122/90   Uncontrolled, likely reactive due to pain, pt to continue medical treatment  - diet, wt control, excercise

## 2022-05-08 NOTE — Progress Notes (Signed)
Patient ID: AVILENE MARRIN, female   DOB: 1956-04-20, 66 y.o.   MRN: [id number]        Chief Complaint: follow up worsening pain with known right lumbar radiculopathy       HPI:  Stephanie Espinoza is a 66 y.o. female here with c/o uncontrolled pain starting last pm at 11 pm as she was going to bed; now severe, constant, primarily right lower back with radicular pain, numbness and weakness worsening;  has already seen Dr Cyndy Freeze NS 2 days ago, MRI ordered but not yet done;  feels as if she might fall down, so borrowed her family member crutch.  Pt denies chest pain, increased sob or doe, wheezing, orthopnea, PND, increased LE swelling, palpitations, dizziness or syncope.   Pt denies polydipsia, polyuria, or new focal neuro s/s.   BP has been controlled at home < 140/90/  Has not tolerated gabapentin in the past due to headache.          Wt Readings from Last 3 Encounters:  05/08/22 164 lb 2 oz (74.4 kg)  02/15/22 166 lb 12.8 oz (75.7 kg)  02/12/22 169 lb (76.7 kg)   BP Readings from Last 3 Encounters:  05/08/22 (!) 144/82  02/18/22 126/80  02/15/22 122/90         Past Medical History:  Diagnosis Date   Abnormal Pap smear of cervix before 1995   always a repeat back to normal except for 1 time and had colpo bioosy -resutls were normal   Allergic rhinitis    Anxiety    Bell's palsy after 1995 ?   no residual SE.   Depression    Genital herpes    History of drug abuse ([place])    drug usser [place].  cocaine   HIV (human immunodeficiency virus infection) ([place]) 1990   from sexual contact who is unknown   Syphilis    treated   Past Surgical History:  Procedure Laterality Date   CESAREAN SECTION  1997   ESOPHAGOGASTRODUODENOSCOPY (EGD) WITH PROPOFOL N/A 03/02/2019   Procedure: ESOPHAGOGASTRODUODENOSCOPY (EGD) WITH PROPOFOL;  Surgeon: Arta Silence, MD;  Location: [facility];  Service: Endoscopy;  Laterality: N/A;   EUS N/A 03/02/2019   Procedure: FULL UPPER ENDOSCOPIC ULTRASOUND (EUS)  RADIAL;  Surgeon: Arta Silence, MD;  Location: [facility];  Service: Endoscopy;  Laterality: N/A;   LAPAROSCOPIC CHOLECYSTECTOMY SINGLE PORT N/A 03/01/2019   Procedure: LAPAROSCOPIC CHOLECYSTECTOMY SINGLE SITE/ INTRAOPERATIVE CHOLANGIOGRAM;  Surgeon: Michael Boston, MD;  Location: [facility] OR[facility];  Service: General;  Laterality: N/A;    reports that she quit smoking about 32 years ago. Her smoking use included cigarettes. She has never used smokeless tobacco. She reports that she does not drink alcohol and does not use drugs. family history includes Breast cancer in an other family member; Diabetes in her father. Allergies  Allergen Reactions   Haldol [Haloperidol] Other (See Comments)    Wipes out her memory   Nevirapine     REACTION: Rash 10/08   Thorazine [Chlorpromazine] Other (See Comments)    "makes me comatose"   Current Outpatient Medications on File Prior to Visit  Medication Sig Dispense Refill   acyclovir ointment (ZOVIRAX) 5 % Apply topically every 3 (three) hours. Use as needed for symptoms relief for no more than 7 days. 15 g 3   albuterol (VENTOLIN HFA) 108 (90 Base) MCG/ACT inhaler Inhale 1-2 puffs into the lungs every 4 (four) hours as needed for wheezing or shortness of breath. 6.7  each 2   cyclobenzaprine (FLEXERIL) 5 MG tablet Take 1 tablet (5 mg total) by mouth 3 (three) times daily as needed for muscle spasms. 30 tablet 1   EPINEPHrine (EPIPEN 2-PAK) 0.3 mg/0.3 mL IJ SOAJ injection Inject 0.3 mg into the muscle as needed for anaphylaxis. 1 each 2   fluticasone (FLONASE) 50 MCG/ACT nasal spray Place 2 sprays into both nostrils daily. 48 mL 2   hydrOXYzine (VISTARIL) 25 MG capsule Take 1 capsule (25 mg total) by mouth every 8 (eight) hours as needed. 30 capsule 0   nystatin (MYCOSTATIN) 100000 UNIT/ML suspension Take 5 mLs (500,000 Units total) by mouth 4 (four) times daily. 60 mL 0   sulfamethoxazole-trimethoprim (BACTRIM DS) 800-160 MG tablet Take 1 tablet by mouth daily. 30  tablet 11   valACYclovir (VALTREX) 500 MG tablet Take 1 tablet (500 mg total) by mouth daily. Increase to bid x 3 days with any symptoms 90 tablet 4   No current facility-administered medications on file prior to visit.        ROS:  All others reviewed and negative.  Objective        PE:  BP (!) 144/82   Pulse 81   Temp 98.5 F (36.9 C) (Oral)   Ht '5\' 2"'$  (1.575 m)   Wt 164 lb 2 oz (74.4 kg)   LMP 11/04/1997   SpO2 96%   BMI 30.02 kg/m                 Constitutional: Pt appears in NAD               HENT: Head: NCAT.                Right Ear: External ear normal.                 Left Ear: External ear normal.                Eyes: . Pupils are equal, round, and reactive to light. Conjunctivae and EOM are normal               Nose: without d/c or deformity               Neck: Neck supple. Gross normal ROM               Cardiovascular: Normal rate and regular rhythm.                 Pulmonary/Chest: Effort normal and breath sounds without rales or wheezing.                               Neurological: Pt is alert. At baseline orientation, motor with 4/5 RLE weakness               Skin: Skin is warm. No rashes, no other new lesions, LE edema - none               Psychiatric: Pt behavior is normal without agitation   Micro: none  Cardiac tracings I have personally interpreted today:  none  Pertinent Radiological findings (summarize): none   Lab Results  Component Value Date   WBC 4.3 05/01/2022   HGB 10.1 (L) 05/01/2022   HCT 32.2 (L) 05/01/2022   PLT 277 05/01/2022   GLUCOSE 83 05/01/2022   CHOL 161 10/10/2021   TRIG 149 10/10/2021   HDL 37 (L) 10/10/2021  LDLDIRECT 102.1 03/02/2009   LDLCALC 99 10/10/2021   ALT 11 05/01/2022   AST 16 05/01/2022   NA 141 05/01/2022   K 3.7 05/01/2022   CL 107 05/01/2022   CREATININE 0.98 05/01/2022   BUN 12 05/01/2022   CO2 27 05/01/2022   TSH 3.23 02/04/2022   HGBA1C 5.9 02/04/2022   Assessment/Plan:  Stephanie Espinoza is a 66  y.o. Black or African American [2] Other or two or more races [6] White or Caucasian [1] female with  has a past medical history of Abnormal Pap smear of cervix (before 1995), Allergic rhinitis, Anxiety, Bell's palsy (after 1995 ?), Depression, Genital herpes, History of drug abuse ([place]), HIV (human immunodeficiency virus infection) ([place]) (1990), and Syphilis.  Right lumbar radiculopathy With acute on subacute worsening, for lyrica 50 bid, and hydrocodone 7.5 325 prn, f/u MRI and Dr Cyndy Freeze as planned  Essential hypertension BP Readings from Last 3 Encounters:  05/08/22 (!) 144/82  02/18/22 126/80  02/15/22 122/90   Uncontrolled, likely reactive due to pain, pt to continue medical treatment  - diet, wt control, excercise   Hyperglycemia Lab Results  Component Value Date   HGBA1C 5.9 02/04/2022   Stable, pt to continue current medical treatment  - diet, wt controlled, excercise  Followup: Return if symptoms worsen or fail to improve.  Cathlean Cower, MD 05/08/2022 7:50 PM [facility]

## 2022-05-09 ENCOUNTER — Other Ambulatory Visit: Payer: Self-pay | Admitting: Neurosurgery

## 2022-05-09 DIAGNOSIS — M4317 Spondylolisthesis, lumbosacral region: Secondary | ICD-10-CM

## 2022-05-11 ENCOUNTER — Emergency Department (HOSPITAL_COMMUNITY): Payer: BC Managed Care – PPO

## 2022-05-11 ENCOUNTER — Encounter (HOSPITAL_COMMUNITY): Payer: Self-pay | Admitting: Emergency Medicine

## 2022-05-11 ENCOUNTER — Other Ambulatory Visit: Payer: Self-pay

## 2022-05-11 ENCOUNTER — Emergency Department (HOSPITAL_COMMUNITY)
Admission: EM | Admit: 2022-05-11 | Discharge: 2022-05-12 | Disposition: A | Discharge status: Home / Self Care | Payer: BC Managed Care – PPO | Attending: Emergency Medicine | Admitting: Emergency Medicine

## 2022-05-11 DIAGNOSIS — G629 Polyneuropathy, unspecified: Secondary | ICD-10-CM

## 2022-05-11 DIAGNOSIS — D649 Anemia, unspecified: Secondary | ICD-10-CM | POA: Diagnosis not present

## 2022-05-11 DIAGNOSIS — M7989 Other specified soft tissue disorders: Secondary | ICD-10-CM | POA: Diagnosis present

## 2022-05-11 LAB — CBC WITH DIFFERENTIAL/PLATELET
Abs Immature Granulocytes: 0.01 10*3/uL (ref 0.00–0.07)
Basophils Absolute: 0 10*3/uL (ref 0.0–0.1)
Basophils Relative: 0 %
Eosinophils Absolute: 0 10*3/uL (ref 0.0–0.5)
Eosinophils Relative: 0 %
HCT: 34.1 % — ABNORMAL LOW (ref 36.0–46.0)
Hemoglobin: 10.4 g/dL — ABNORMAL LOW (ref 12.0–15.0)
Immature Granulocytes: 0 %
Lymphocytes Relative: 42 %
Lymphs Abs: 1.9 10*3/uL (ref 0.7–4.0)
MCH: 26.8 pg (ref 26.0–34.0)
MCHC: 30.5 g/dL (ref 30.0–36.0)
MCV: 87.9 fL (ref 80.0–100.0)
Monocytes Absolute: 0.2 10*3/uL (ref 0.1–1.0)
Monocytes Relative: 5 %
Neutro Abs: 2.3 10*3/uL (ref 1.7–7.7)
Neutrophils Relative %: 53 %
Platelets: 265 10*3/uL (ref 150–400)
RBC: 3.88 MIL/uL (ref 3.87–5.11)
RDW: 14.5 % (ref 11.5–15.5)
WBC: 4.5 10*3/uL (ref 4.0–10.5)
nRBC: 0 % (ref 0.0–0.2)

## 2022-05-11 LAB — COMPREHENSIVE METABOLIC PANEL
ALT: 21 U/L (ref 0–44)
AST: 26 U/L (ref 15–41)
Albumin: 3.3 g/dL — ABNORMAL LOW (ref 3.5–5.0)
Alkaline Phosphatase: 95 U/L (ref 38–126)
Anion gap: 11 (ref 5–15)
BUN: 16 mg/dL (ref 8–23)
CO2: 23 mmol/L (ref 22–32)
Calcium: 9 mg/dL (ref 8.9–10.3)
Chloride: 104 mmol/L (ref 98–111)
Creatinine, Ser: 1.03 mg/dL — ABNORMAL HIGH (ref 0.44–1.00)
GFR, Estimated: >60 mL/min (ref >60)
Glucose, Bld: 127 mg/dL — ABNORMAL HIGH (ref 70–99)
Potassium: 3.9 mmol/L (ref 3.5–5.1)
Sodium: 138 mmol/L (ref 135–145)
Total Bilirubin: 0.6 mg/dL (ref 0.3–1.2)
Total Protein: 7.5 g/dL (ref 6.5–8.1)

## 2022-05-11 NOTE — ED Triage Notes (Signed)
Patient reports pain with swelling and numbness of right foot onset Wednesday , denies injury , prescribed with medications by her PCP with no relief . She adds left foot tingling.

## 2022-05-12 ENCOUNTER — Encounter (HOSPITAL_COMMUNITY): Payer: Self-pay | Admitting: Emergency Medicine

## 2022-05-12 ENCOUNTER — Emergency Department (HOSPITAL_BASED_OUTPATIENT_CLINIC_OR_DEPARTMENT_OTHER)
Admission: RE | Admit: 2022-05-12 | Discharge: 2022-05-12 | Disposition: A | Discharge status: Home / Self Care | Payer: BC Managed Care – PPO | Source: Ambulatory Visit | Attending: Emergency Medicine | Admitting: Emergency Medicine

## 2022-05-12 DIAGNOSIS — M7989 Other specified soft tissue disorders: Secondary | ICD-10-CM

## 2022-05-12 DIAGNOSIS — M5430 Sciatica, unspecified side: Secondary | ICD-10-CM

## 2022-05-12 MED ORDER — KETOROLAC TROMETHAMINE 60 MG/2ML IM SOLN
60.0000 mg | Freq: Once | INTRAMUSCULAR | Status: AC
  Administered 2022-05-12: 60 mg via INTRAMUSCULAR
  Filled 2022-05-12: qty 2

## 2022-05-12 MED ORDER — LIDOCAINE 5 % EX PTCH: 1.0000 | MEDICATED_PATCH | CUTANEOUS | 0 refills | Status: DC

## 2022-05-12 NOTE — ED Provider Notes (Signed)
[facility] EMERGENCY DEPARTMENT Provider Note   CSN: [id number] Arrival date & time: 05/11/22  2154     History  Chief Complaint  Patient presents with   Foot Swelling/Pain     Stephanie Espinoza is a 66 y.o. female.  The history is provided by the patient.  Foot Pain This is a new problem. The current episode started more than 1 week ago. The problem occurs constantly. The problem has not changed since onset.Pertinent negatives include no chest pain, no abdominal pain, no headaches and no shortness of breath. Nothing aggravates the symptoms. Nothing relieves the symptoms. Treatments tried: lyrica and just started methylprednisolone. The treatment provided no relief.  Patient is seeing Dr. Cyndy Freeze for back issues and has an MRI that needs to be an open MRI ordered.  Lyrica is not helping her pain.  Started on steroids and hafter one day still not better.  Right foot is swollen.       Home Medications Prior to Admission medications   Medication Sig Start Date End Date Taking? Authorizing Provider  acyclovir ointment (ZOVIRAX) 5 % Apply topically every 3 (three) hours. Use as needed for symptoms relief for no more than 7 days. 08/02/21   Megan Salon, MD  albuterol (VENTOLIN HFA) 108 (90 Base) MCG/ACT inhaler Inhale 1-2 puffs into the lungs every 4 (four) hours as needed for wheezing or shortness of breath. 01/25/22   Hoyt Koch, MD  cyclobenzaprine (FLEXERIL) 5 MG tablet Take 1 tablet (5 mg total) by mouth 3 (three) times daily as needed for muscle spasms. 04/06/21   Hoyt Koch, MD  EPINEPHrine (EPIPEN 2-PAK) 0.3 mg/0.3 mL IJ SOAJ injection Inject 0.3 mg into the muscle as needed for anaphylaxis. 07/28/21   Janith Lima, MD  fluticasone (FLONASE) 50 MCG/ACT nasal spray Place 2 sprays into both nostrils daily. 03/01/21   Hoyt Koch, MD  HYDROcodone-acetaminophen (NORCO) 7.5-325 MG tablet Take 1 tablet by mouth every 6 (six) hours as needed for  moderate pain. 05/08/22   Biagio Borg, MD  hydrOXYzine (VISTARIL) 25 MG capsule Take 1 capsule (25 mg total) by mouth every 8 (eight) hours as needed. 07/27/21   Janith Lima, MD  nystatin (MYCOSTATIN) 100000 UNIT/ML suspension Take 5 mLs (500,000 Units total) by mouth 4 (four) times daily. 03/18/22   Hoyt Koch, MD  pregabalin (LYRICA) 50 MG capsule Take 1 capsule (50 mg total) by mouth 2 (two) times daily. 05/08/22   Biagio Borg, MD  sulfamethoxazole-trimethoprim (BACTRIM DS) 800-160 MG tablet Take 1 tablet by mouth daily. 10/16/21   Michel Bickers, MD  valACYclovir (VALTREX) 500 MG tablet Take 1 tablet (500 mg total) by mouth daily. Increase to bid x 3 days with any symptoms 08/02/21   Megan Salon, MD      Allergies    Haldol [haloperidol], Nevirapine, and Thorazine [chlorpromazine]    Review of Systems   Review of Systems  HENT:  Negative for facial swelling.   Respiratory:  Negative for shortness of breath.   Cardiovascular:  Negative for chest pain.  Gastrointestinal:  Negative for abdominal pain.  Musculoskeletal:  Positive for arthralgias.       Foot swelling R  Neurological:  Negative for headaches.  All other systems reviewed and are negative.   Physical Exam Updated Vital Signs BP (!) 168/105 (BP Location: Right Arm)   Pulse 73   Temp 98.1 F (36.7 C) (Oral)   Resp 15  LMP 11/04/1997   SpO2 96%  Physical Exam Vitals and nursing note reviewed.  Constitutional:      General: She is not in acute distress.    Appearance: Normal appearance. She is well-developed.  HENT:     Head: Normocephalic and atraumatic.     Nose: Nose normal.  Eyes:     Pupils: Pupils are equal, round, and reactive to light.  Cardiovascular:     Rate and Rhythm: Normal rate and regular rhythm.     Pulses: Normal pulses.     Heart sounds: Normal heart sounds.  Pulmonary:     Effort: Pulmonary effort is normal. No respiratory distress.     Breath sounds: Normal breath sounds.   Abdominal:     General: Abdomen is flat. Bowel sounds are normal. There is no distension.     Palpations: Abdomen is soft.     Tenderness: There is no abdominal tenderness. There is no guarding or rebound.  Genitourinary:    Vagina: No vaginal discharge.  Musculoskeletal:        General: No tenderness or deformity. Normal range of motion.     Cervical back: Neck supple.     Right ankle: Normal.     Right Achilles Tendon: Normal.     Left ankle: Normal.     Left Achilles Tendon: Normal.     Right foot: Normal capillary refill. Swelling present. No bony tenderness or crepitus. Normal pulse.  Skin:    General: Skin is warm and dry.     Capillary Refill: Capillary refill takes less than 2 seconds.     Findings: No erythema or rash.  Neurological:     General: No focal deficit present.     Mental Status: She is alert.     Deep Tendon Reflexes: Reflexes normal.  Psychiatric:        Mood and Affect: Mood normal.     ED Results / Procedures / Treatments   Labs (all labs ordered are listed, but only abnormal results are displayed) Results for orders placed or performed during the hospital encounter of 05/11/22  CBC with Differential  Result Value Ref Range   WBC 4.5 4.0 - 10.5 K/uL   RBC 3.88 3.87 - 5.11 MIL/uL   Hemoglobin 10.4 (L) 12.0 - 15.0 g/dL   HCT 34.1 (L) 36.0 - 46.0 %   MCV 87.9 80.0 - 100.0 fL   MCH 26.8 26.0 - 34.0 pg   MCHC 30.5 30.0 - 36.0 g/dL   RDW 14.5 11.5 - 15.5 %   Platelets 265 150 - 400 K/uL   nRBC 0.0 0.0 - 0.2 %   Neutrophils Relative % 53 %   Neutro Abs 2.3 1.7 - 7.7 K/uL   Lymphocytes Relative 42 %   Lymphs Abs 1.9 0.7 - 4.0 K/uL   Monocytes Relative 5 %   Monocytes Absolute 0.2 0.1 - 1.0 K/uL   Eosinophils Relative 0 %   Eosinophils Absolute 0.0 0.0 - 0.5 K/uL   Basophils Relative 0 %   Basophils Absolute 0.0 0.0 - 0.1 K/uL   Immature Granulocytes 0 %   Abs Immature Granulocytes 0.01 0.00 - 0.07 K/uL  Comprehensive metabolic panel  Result  Value Ref Range   Sodium 138 135 - 145 mmol/L   Potassium 3.9 3.5 - 5.1 mmol/L   Chloride 104 98 - 111 mmol/L   CO2 23 22 - 32 mmol/L   Glucose, Bld 127 (H) 70 - 99 mg/dL   BUN 16 8 -  23 mg/dL   Creatinine, Ser 1.03 (H) 0.44 - 1.00 mg/dL   Calcium 9.0 8.9 - 10.3 mg/dL   Total Protein 7.5 6.5 - 8.1 g/dL   Albumin 3.3 (L) 3.5 - 5.0 g/dL   AST 26 15 - 41 U/L   ALT 21 0 - 44 U/L   Alkaline Phosphatase 95 38 - 126 U/L   Total Bilirubin 0.6 0.3 - 1.2 mg/dL   GFR, Estimated >60 >60 mL/min   Anion gap 11 5 - 15   DG Foot Complete Right  Result Date: 05/11/2022 CLINICAL DATA:  Foot pain and swelling, no known injury, initial encounter EXAM: RIGHT FOOT COMPLETE - 3+ VIEW COMPARISON:  None Available. FINDINGS: There is no evidence of fracture or dislocation. There is no evidence of arthropathy or other focal bone abnormality. Soft tissues are unremarkable. IMPRESSION: No acute abnormality noted. Electronically Signed   By: Inez Catalina M.D.   On: 05/11/2022 22:32      Radiology DG Foot Complete Right  Result Date: 05/11/2022 CLINICAL DATA:  Foot pain and swelling, no known injury, initial encounter EXAM: RIGHT FOOT COMPLETE - 3+ VIEW COMPARISON:  None Available. FINDINGS: There is no evidence of fracture or dislocation. There is no evidence of arthropathy or other focal bone abnormality. Soft tissues are unremarkable. IMPRESSION: No acute abnormality noted. Electronically Signed   By: Inez Catalina M.D.   On: 05/11/2022 22:32    Procedures Procedures    Medications Ordered in ED Medications  ketorolac (TORADOL) injection 60 mg (has no administration in time range)    ED Course/ Medical Decision Making/ A&P                           Medical Decision Making Foot pain and right foot swelling.    Amount and/or Complexity of Data Reviewed External Data Reviewed: notes.    Details: previous notes reviewed Labs: ordered.    Details: All labs reviewed:  normal white count, hemoglobin low  10.4 normal platelets. Normal sodium 138 and potassium 3.9 Normal creatinine 1.03.  Normal AST and ALT Radiology: ordered and independent interpretation performed.    Details: no fracture by me  Risk Prescription drug management. Risk Details: Will give a dose of toradol and schedule outpatient DVT study. It is only the foot and Homan sign is negative.  Follow up with PMD and your neurosurgeon for ongoing care.     Final Clinical Impression(s) / ED Diagnoses Final diagnoses:  None   Return for intractable cough, coughing up blood, fevers > 100.4 unrelieved by medication, shortness of breath, intractable vomiting, chest pain, shortness of breath, weakness, numbness, changes in speech, facial asymmetry, abdominal pain, passing out, Inability to tolerate liquids or food, cough, altered mental status or any concerns. No signs of systemic illness or infection. The patient is nontoxic-appearing on exam and vital signs are within normal limits.  I have reviewed the triage vital signs and the nursing notes. Pertinent labs & imaging results that were available during my care of the patient were reviewed by me and considered in my medical decision making (see chart for details). After history, exam, and medical workup I feel the patient has been appropriately medically screened and is safe for discharge home. Pertinent diagnoses were discussed with the patient. Patient was given return precautions.  Rx / DC Orders ED Discharge Orders     None         Sheriden Archibeque, MD 05/12/22  0616  

## 2022-05-12 NOTE — Progress Notes (Signed)
VASCULAR LAB    Right lower extremity venous duplex has been performed.  See CV proc for preliminary results.   Nakeya Adinolfi, RVT 05/12/2022, 11:19 AM

## 2022-05-13 ENCOUNTER — Encounter: Payer: Self-pay | Admitting: Internal Medicine

## 2022-05-13 NOTE — Telephone Encounter (Signed)
Patient still having swelling in foot

## 2022-05-15 NOTE — Telephone Encounter (Signed)
Pt request pain medication for foot.[name] LOV-05-08-2022     Please Advise.

## 2022-05-16 ENCOUNTER — Ambulatory Visit
Admission: RE | Admit: 2022-05-16 | Discharge: 2022-05-16 | Disposition: A | Discharge status: Home / Self Care | Payer: BC Managed Care – PPO | Source: Ambulatory Visit | Attending: Neurosurgery | Admitting: Neurosurgery

## 2022-05-16 DIAGNOSIS — M4317 Spondylolisthesis, lumbosacral region: Secondary | ICD-10-CM

## 2022-05-20 ENCOUNTER — Encounter: Payer: Self-pay | Admitting: Internal Medicine

## 2022-05-20 ENCOUNTER — Ambulatory Visit: Payer: BC Managed Care – PPO | Admitting: Internal Medicine

## 2022-05-20 DIAGNOSIS — D649 Anemia, unspecified: Secondary | ICD-10-CM

## 2022-05-20 DIAGNOSIS — M79671 Pain in right foot: Secondary | ICD-10-CM | POA: Diagnosis not present

## 2022-05-20 DIAGNOSIS — B20 Human immunodeficiency virus [HIV] disease: Secondary | ICD-10-CM | POA: Diagnosis not present

## 2022-05-20 MED ORDER — COLCHICINE 0.6 MG PO TABS: 0.6000 mg | ORAL_TABLET | Freq: Every day | ORAL | 0 refills | Status: DC

## 2022-05-20 MED ORDER — HYDROCODONE-ACETAMINOPHEN 5-325 MG PO TABS: 1.0000 | ORAL_TABLET | Freq: Four times a day (QID) | ORAL | 0 refills | Status: DC | PRN

## 2022-05-20 NOTE — Progress Notes (Unsigned)
   Subjective:   Patient ID: Stephanie Espinoza, female    DOB: Apr 27, 1956, 66 y.o.   MRN: [id number]  HPI The patient is a 66 YO female coming in for right foot pain and swelling. She recently had MRI which was stable from prior lumbar. She had x-ray of the foot without fracture and [place] without DVT. Seen at this office and ER and neurosurgery. She was prescribed lyrica and could not tolerate this. Was prescribed hydrocodone but this dosing was on backorder and she was unable to get this. Overall started 2 weeks ago. Not improving.   Review of Systems  Constitutional: Negative.   HENT: Negative.    Eyes: Negative.   Respiratory:  Negative for cough, chest tightness and shortness of breath.   Cardiovascular:  Positive for leg swelling. Negative for chest pain and palpitations.  Gastrointestinal:  Negative for abdominal distention, abdominal pain, constipation, diarrhea, nausea and vomiting.  Musculoskeletal:  Positive for arthralgias, gait problem and myalgias.  Skin: Negative.   Psychiatric/Behavioral: Negative.      Objective:  Physical Exam Constitutional:      Appearance: She is well-developed.  HENT:     Head: Normocephalic and atraumatic.  Cardiovascular:     Rate and Rhythm: Normal rate and regular rhythm.  Pulmonary:     Effort: Pulmonary effort is normal. No respiratory distress.     Breath sounds: Normal breath sounds. No wheezing or rales.  Abdominal:     General: Bowel sounds are normal. There is no distension.     Palpations: Abdomen is soft.     Tenderness: There is no abdominal tenderness. There is no rebound.  Musculoskeletal:        General: Tenderness present.     Cervical back: Normal range of motion.     Comments: Minimal swelling right foot versus left, no pitting edema, no tenderness to palpation but general tenderness  Skin:    General: Skin is warm and dry.  Neurological:     Mental Status: She is alert and oriented to person, place, and time.      Coordination: Coordination abnormal.     Comments: Very painful to walk and stand     Vitals:   05/20/22 1110  BP: 130/84  Pulse: 89  Resp: 18  SpO2: 96%  Weight: 160 lb 6.4 oz (72.8 kg)  Height: '5\' 2"'$  (1.575 m)    Assessment & Plan:  Visit time 25 minutes in face to face communication with patient and coordination of care, additional 15 minutes spent in record review, coordination or care, ordering tests, communicating/referring to other healthcare professionals, documenting in medical records all on the same day of the visit for total time 40 minutes spent on the visit.

## 2022-05-20 NOTE — Patient Instructions (Signed)
We will try colchicine to take 1 pill daily and we have sent in hydrocodone to use for pain as needed.   Let us know in 1-2 days how you are doing.

## 2022-05-21 ENCOUNTER — Encounter: Payer: BC Managed Care – PPO | Admitting: Internal Medicine

## 2022-05-21 ENCOUNTER — Encounter: Payer: Self-pay | Admitting: Internal Medicine

## 2022-05-21 DIAGNOSIS — M79671 Pain in right foot: Secondary | ICD-10-CM

## 2022-05-22 ENCOUNTER — Ambulatory Visit (INDEPENDENT_AMBULATORY_CARE_PROVIDER_SITE_OTHER): Payer: BC Managed Care – PPO | Admitting: Sports Medicine

## 2022-05-22 ENCOUNTER — Other Ambulatory Visit: Payer: Self-pay | Admitting: Sports Medicine

## 2022-05-22 ENCOUNTER — Telehealth: Payer: BC Managed Care – PPO | Admitting: Internal Medicine

## 2022-05-22 ENCOUNTER — Encounter: Payer: Self-pay | Admitting: Sports Medicine

## 2022-05-22 VITALS — BP 142/80 | HR 103 | Ht 62.0 in | Wt 160.0 lb

## 2022-05-22 DIAGNOSIS — M5416 Radiculopathy, lumbar region: Secondary | ICD-10-CM

## 2022-05-22 DIAGNOSIS — M5442 Lumbago with sciatica, left side: Secondary | ICD-10-CM

## 2022-05-22 DIAGNOSIS — G8929 Other chronic pain: Secondary | ICD-10-CM

## 2022-05-22 DIAGNOSIS — M79671 Pain in right foot: Secondary | ICD-10-CM | POA: Insufficient documentation

## 2022-05-22 DIAGNOSIS — M5441 Lumbago with sciatica, right side: Secondary | ICD-10-CM

## 2022-05-22 DIAGNOSIS — M4317 Spondylolisthesis, lumbosacral region: Secondary | ICD-10-CM | POA: Diagnosis not present

## 2022-05-22 DIAGNOSIS — M51369 Other intervertebral disc degeneration, lumbar region without mention of lumbar back pain or lower extremity pain: Secondary | ICD-10-CM

## 2022-05-22 DIAGNOSIS — M5136 Other intervertebral disc degeneration, lumbar region: Secondary | ICD-10-CM | POA: Diagnosis not present

## 2022-05-22 NOTE — Progress Notes (Addendum)
Stephanie Espinoza D.[place] Phone: [phone]   Assessment and Plan:     1. DDD (degenerative disc disease), lumbar 2. Chronic bilateral low back pain with bilateral sciatica 3. Anterolisthesis of lumbosacral spine 4. Right lumbar radiculopathy -Chronic with exacerbation, subsequent sports medicine visit - Patient presents with recurrence and worsening of right lower extremity pain, numbness, tingling, weakness as well as persistent left lower extremity tingling.  Patient symptoms are consistent with an L5 radiculopathy which is consistent with advanced facet hypertrophy at L5-S1 with grade 1 anterior listhesis seen on lumbar MRI from 05/16/2022 - Patient has trialed multiple treatment modalities over the past 1 year and the only treatment that has provided her with any significant and lasting relief has been epidural injections.  Despite this, patient states that she no longer wants to do epidural injections and she instead "wants the problem fixed".  The only solution I can see for this would be surgery.  As patient states that her neurosurgeon told her that the back is not her problem, we will refer to a different neurosurgeon that can do a physical exam and look at patient's MRI and see if they feel that surgery would be beneficial to patient based on her physical exam findings consistent with a lumbar radiculopathy. - Patient established care with neurosurgery.  I am unable to read their office notes, however patient states that she was told that her back is not the cause of her right lower extremity pain and tingling.  I feel that patient's multiple findings on lumbar MRI are consistent with an L5 radiculopathy based on history, physical exam.    Pertinent previous records reviewed include lumbar MRI 05/16/2022, PCP note 05/20/2022, ER note 05/12/2022   Follow Up: As needed.   Subjective:   I, Stephanie Espinoza, am  serving as a Education administrator for Doctor Glennon Mac  Chief Complaint: right foot pain   HPI:   05/22/22 Patient is a 66 year old female complaining of right foot pain. Patient states right foot pain and swelling. She recently had MRI which was stable from prior lumbar. She had x-ray of the foot without fracture and [place] without DVT. Seen at this office and ER and neurosurgery. She was prescribed lyrica and could not tolerate this. Was prescribed hydrocodone but this dosing was on backorder and she was unable to get this. Overall started 2 weeks ago. Not improving. Started Tuesday after July 4th , left foot is also tingling ,    Relevant Historical Information: Hypertension, HIV, DDD lumbar  Additional pertinent review of systems negative.   Current Outpatient Medications:    acyclovir ointment (ZOVIRAX) 5 %, Apply topically every 3 (three) hours. Use as needed for symptoms relief for no more than 7 days., Disp: 15 g, Rfl: 3   albuterol (VENTOLIN HFA) 108 (90 Base) MCG/ACT inhaler, Inhale 1-2 puffs into the lungs every 4 (four) hours as needed for wheezing or shortness of breath., Disp: 6.7 each, Rfl: 2   colchicine 0.6 MG tablet, Take 1 tablet (0.6 mg total) by mouth daily., Disp: 30 tablet, Rfl: 0   cyclobenzaprine (FLEXERIL) 5 MG tablet, Take 1 tablet (5 mg total) by mouth 3 (three) times daily as needed for muscle spasms., Disp: 30 tablet, Rfl: 1   EPINEPHrine (EPIPEN 2-PAK) 0.3 mg/0.3 mL IJ SOAJ injection, Inject 0.3 mg into the muscle as needed for anaphylaxis., Disp: 1 each, Rfl: 2   fluticasone (FLONASE) 50  MCG/ACT nasal spray, Place 2 sprays into both nostrils daily., Disp: 48 mL, Rfl: 2   HYDROcodone-acetaminophen (NORCO/VICODIN) 5-325 MG tablet, Take 1 tablet by mouth every 6 (six) hours as needed for moderate pain., Disp: 30 tablet, Rfl: 0   lidocaine (LIDODERM) 5 %, Place 1 patch onto the skin daily. Remove & Discard patch within 12 hours or as directed by MD, Disp: 30 patch, Rfl: 0    sulfamethoxazole-trimethoprim (BACTRIM DS) 800-160 MG tablet, Take 1 tablet by mouth daily., Disp: 30 tablet, Rfl: 11   valACYclovir (VALTREX) 500 MG tablet, Take 1 tablet (500 mg total) by mouth daily. Increase to bid x 3 days with any symptoms, Disp: 90 tablet, Rfl: 4   Objective:     Vitals:   05/22/22 1355  BP: (!) 142/80  Pulse: (!) 103  SpO2: 98%  Weight: 160 lb (72.6 kg)  Height: '5\' 2"'$  (1.575 m)      Body mass index is 29.26 kg/m.    Physical Exam:    Gen: Appears well, nad, nontoxic and pleasant Psych: Alert and oriented, appropriate mood and affect Neuro: Decreased sensation to light touch over right lateral calf, dorsum and plantar surface of right foot compared to left, otherwise sensation intact.  Strength 4/5 in knee flexion, plantarflexion, dorsiflexion, but otherwise strength is 5/5 in upper and lower extremities, muscle tone wnl Skin: no susupicious lesions or rashes  Back - Normal skin, Spine with normal alignment and no deformity.   No tenderness to vertebral process palpation.     bilateral paraspinous muscles are mildly tender and without spasm Straight leg raise positive on right Trace bilateral swelling in lower extremities   Electronically signed by:  Stephanie Espinoza D.[facility] 2:43 PM 05/22/22

## 2022-05-22 NOTE — Patient Instructions (Addendum)
Good to see you  Epidural referral right side L5-S1 Follow up 2 weeks after your injection to discuss results

## 2022-05-22 NOTE — Addendum Note (Signed)
Addended by: Pincus Badder R on: 05/22/2022 02:42 PM   Modules accepted: Orders

## 2022-05-22 NOTE — Assessment & Plan Note (Signed)
Recent labs from ER reviewed and stable.

## 2022-05-22 NOTE — Assessment & Plan Note (Signed)
She is having severe pain. She has taken methylprednisone which did not help. She has had lyrica which did not help and gave side effects. 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 She is using lidocaine 5% which is providing some relief. We discussed trying another prednisone dose pack to see if this helps and that it may not help and we did decide not to do this. Rx hydrocodone 5/325 since the 7.5/325 previously prescribed by another provider was not available. She is meeting with neurosurgery later this week although we reviewed MRI findings during our visit without significant change but persistent low back findings. We will try colchicine 0.6 mg daily to see if she has some component of gout or pseudogout causing the pain which is severe. This may take 2-3 days to start working. She is taking tylenol and ibuprofen otc without relief. If no improvement will refer to sports medicine so they can assess if she has a torn tendon or inflammation they can detect.

## 2022-05-22 NOTE — Assessment & Plan Note (Signed)
She is on bactrim for prevention of opportunistic infection. Last CD4 count <200. She is not on HAART due to side effects with multiple treatments.

## 2022-05-23 ENCOUNTER — Other Ambulatory Visit: Payer: BC Managed Care – PPO

## 2022-05-23 ENCOUNTER — Other Ambulatory Visit: Payer: Self-pay | Admitting: Sports Medicine

## 2022-05-23 DIAGNOSIS — M5136 Other intervertebral disc degeneration, lumbar region: Secondary | ICD-10-CM

## 2022-05-23 NOTE — Addendum Note (Signed)
Addended by: Pricilla Holm A on: 05/23/2022 10:43 AM   Modules accepted: Orders

## 2022-05-27 ENCOUNTER — Other Ambulatory Visit: Payer: Self-pay | Admitting: Sports Medicine

## 2022-05-27 NOTE — Addendum Note (Signed)
Addended by: Pricilla Holm A on: 05/27/2022 01:33 PM   Modules accepted: Orders

## 2022-05-29 ENCOUNTER — Telehealth: Payer: Self-pay | Admitting: Internal Medicine

## 2022-05-29 ENCOUNTER — Other Ambulatory Visit: Payer: BC Managed Care – PPO

## 2022-05-29 DIAGNOSIS — M79671 Pain in right foot: Secondary | ICD-10-CM

## 2022-05-29 NOTE — Telephone Encounter (Signed)
Noted. Provider has been made aware

## 2022-05-29 NOTE — Telephone Encounter (Signed)
Type of form received- FMLA   Form placed in- Provider mailbox  Additional instructions from the patient-Notify by phone when complete  Reminded patient that forms take 7-10 business days

## 2022-05-30 LAB — ANA, IFA COMPREHENSIVE PANEL
Anti Nuclear Antibody (ANA): NEGATIVE
ENA SM Ab Ser-aCnc: <1 AI
SM/RNP: <1 AI
SSA (Ro) (ENA) Antibody, IgG: <1 AI
SSB (La) (ENA) Antibody, IgG: <1 AI
Scleroderma (Scl-70) (ENA) Antibody, IgG: <1 AI
ds DNA Ab: <1 IU/mL

## 2022-05-31 ENCOUNTER — Encounter: Payer: Self-pay | Admitting: Internal Medicine

## 2022-06-03 MED ORDER — AMITRIPTYLINE HCL 25 MG PO TABS: 25.0000 mg | ORAL_TABLET | Freq: Every day | ORAL | 0 refills | Status: DC

## 2022-06-05 ENCOUNTER — Encounter: Payer: Self-pay | Admitting: Podiatry

## 2022-06-05 ENCOUNTER — Ambulatory Visit (INDEPENDENT_AMBULATORY_CARE_PROVIDER_SITE_OTHER): Payer: BC Managed Care – PPO

## 2022-06-05 ENCOUNTER — Ambulatory Visit: Payer: BC Managed Care – PPO | Admitting: Podiatry

## 2022-06-05 DIAGNOSIS — M7751 Other enthesopathy of right foot: Secondary | ICD-10-CM | POA: Diagnosis not present

## 2022-06-05 DIAGNOSIS — R609 Edema, unspecified: Secondary | ICD-10-CM

## 2022-06-05 DIAGNOSIS — G629 Polyneuropathy, unspecified: Secondary | ICD-10-CM

## 2022-06-05 MED ORDER — TRIAMCINOLONE ACETONIDE 10 MG/ML IJ SUSP
10.0000 mg | Freq: Once | INTRAMUSCULAR | Status: AC
  Administered 2022-06-05: 10 mg

## 2022-06-06 NOTE — Progress Notes (Signed)
Subjective:   Patient ID: Stephanie Espinoza, female   DOB: 66 y.o.   MRN: [id number]   HPI Patient presents stating that she woke up several months ago and had complete numbness in her right foot that so far she has seen a neurosurgeon who does not think it is a impinged nerve in her back and she is due to see neurologist at the end of the month.  She has some pain in her ankle 2 and swelling around her ankle and does not have any history of trauma.  Patient does not smoke likes to be active   Review of Systems  All other systems reviewed and are negative.       Objective:  Physical Exam Vitals and nursing note reviewed.  Constitutional:      Appearance: She is well-developed.  Pulmonary:     Effort: Pulmonary effort is normal.  Musculoskeletal:        General: Normal range of motion.  Skin:    General: Skin is warm.  Neurological:     Mental Status: She is alert.     Neurovascular status intact muscle strength was found to be adequate range of motion adequate.  Patient does have exquisite discomfort in the sinus tarsi right and does have a numb right foot but no muscle strength loss.  Patient has good digit perfusion well oriented x3     Assessment:  The most likely cause would still be some type of nerve impingement most likely within the back but this will be evaluated by neurologist to try to determine.  I did do sterile prep and injected the sinus tarsi right 3 mg Kenalog 5 mg Xylocaine and patient will be seen by me as needed  X-rays indicate no indications of any fractures or advanced arthritis right mild spurring around the first MPJ     Plan:  Above discussed is the plan that I am doing

## 2022-06-07 ENCOUNTER — Telehealth: Payer: Self-pay | Admitting: *Deleted

## 2022-06-07 NOTE — Telephone Encounter (Signed)
Patient is calling because she is still having pain and swelling and achy after receiving injection one day ago.She cannot wear compression socks,can barely walk on that foot. Please advise.

## 2022-06-10 NOTE — Telephone Encounter (Signed)
Should be improving

## 2022-06-12 ENCOUNTER — Other Ambulatory Visit: Payer: Self-pay | Admitting: Internal Medicine

## 2022-06-16 ENCOUNTER — Encounter: Payer: Self-pay | Admitting: Sports Medicine

## 2022-06-19 ENCOUNTER — Telehealth: Payer: Self-pay

## 2022-06-19 NOTE — Telephone Encounter (Signed)
Review.

## 2022-06-20 ENCOUNTER — Other Ambulatory Visit: Payer: Self-pay | Admitting: Internal Medicine

## 2022-06-21 NOTE — Telephone Encounter (Signed)
Spoke with PT and retreived document! Handed it off to them and let them to get back up with [place] if there were any problems.

## 2022-06-24 ENCOUNTER — Encounter: Payer: Self-pay | Admitting: Internal Medicine

## 2022-07-03 NOTE — Telephone Encounter (Signed)
PT visits today with these forms. I have placed forms in Dr.Crawford's mailbox and have put sticky notes on both of those pages. PT would like to be notified once these forms are filled out and forms to be faxed to  [facility]: [phone]

## 2022-07-11 ENCOUNTER — Other Ambulatory Visit: Payer: Self-pay | Admitting: Internal Medicine

## 2022-07-21 ENCOUNTER — Other Ambulatory Visit: Payer: Self-pay | Admitting: Internal Medicine

## 2022-07-29 ENCOUNTER — Ambulatory Visit: Payer: BC Managed Care – PPO | Admitting: Neurology

## 2022-07-29 ENCOUNTER — Encounter: Payer: Self-pay | Admitting: Neurology

## 2022-07-29 VITALS — BP 157/102 | HR 105 | Ht 62.0 in | Wt 145.2 lb

## 2022-07-29 DIAGNOSIS — E519 Thiamine deficiency, unspecified: Secondary | ICD-10-CM

## 2022-07-29 DIAGNOSIS — E531 Pyridoxine deficiency: Secondary | ICD-10-CM

## 2022-07-29 DIAGNOSIS — G63 Polyneuropathy in diseases classified elsewhere: Secondary | ICD-10-CM

## 2022-07-29 DIAGNOSIS — B20 Human immunodeficiency virus [HIV] disease: Secondary | ICD-10-CM | POA: Diagnosis not present

## 2022-07-29 NOTE — Patient Instructions (Addendum)
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 It affects nerves that carry signals between the spinal cord and the arms, legs, and the rest of the body (peripheral nerves). It does not affect nerves in the spinal cord or brain. In peripheral neuropathy, one nerve or a group of nerves may be damaged. Peripheral neuropathy is a broad category that includes many specific nerve disorders, like diabetic neuropathy, hereditary neuropathy, and carpal tunnel syndrome. What are the causes? This condition may be caused by: Certain diseases, such as: Diabetes. This is the most common cause of peripheral neuropathy. Autoimmune diseases, such as rheumatoid arthritis and systemic lupus erythematosus. Nerve diseases that are passed from parent to child (inherited). Kidney disease. Thyroid disease. Other causes may include: Nerve injury. Pressure or stress on a nerve that lasts a long time. Lack (deficiency) of B vitamins. This can result from alcoholism, poor diet, or a restricted diet. Infections (HIV, Syphillis) Some medicines, such as cancer medicines (chemotherapy). Poisonous (toxic) substances, such as lead and mercury. Too little blood flowing to the legs. In some cases, the cause of this condition is not known. What are the signs or symptoms? Symptoms of this condition depend on which of your nerves is damaged. Symptoms in the legs, hands, and arms can include: Loss of feeling (numbness) in the feet, hands, or both. Tingling in the feet, hands, or both. Burning pain. Very sensitive skin. Weakness. Not being able to move a part of the body (paralysis). Clumsiness or poor coordination. Muscle twitching. Loss of balance. Symptoms in other parts of the body can include: Not being able to control your bladder. Feeling dizzy. Sexual problems. How is this diagnosed? Diagnosing  and finding the cause of peripheral neuropathy can be difficult. Your health care provider will take your medical history and do a physical exam. A neurological exam will also be done. This involves checking things that are affected by your brain, spinal cord, and nerves (nervous system). For example, your health care provider will check your reflexes, how you move, and what you can feel. You may have other tests, such as: Blood tests. Electromyogram (EMG) and nerve conduction tests. These tests check nerve function and how well the nerves are controlling the muscles. Imaging tests, such as a CT scan or MRI, to rule out other causes of your symptoms. Removing a small piece of nerve to be examined in a lab (nerve biopsy). Removing and examining a small amount of the fluid that surrounds the brain and spinal cord (lumbar puncture). How is this treated? Treatment for this condition may involve: Treating the underlying cause of the neuropathy, such as diabetes, kidney disease, or vitamin deficiencies. Stopping medicines that can cause neuropathy, such as chemotherapy. Medicine to help relieve pain. Medicines may include: Prescription or over-the-counter pain medicine. Anti-seizure medicine. Antidepressants. Pain-relieving patches that are applied to painful areas of skin. Surgery to relieve pressure on a nerve or to destroy a nerve that is causing pain. Physical therapy to help improve movement and balance. Devices to help you move around (assistive devices). Follow these instructions at home: Medicines Take over-the-counter and prescription medicines only as told by your health care provider. Do not take any other medicines without first asking your health care provider. Ask your health care provider if the medicine prescribed to you requires you to avoid driving or using machinery. Lifestyle  Do not use any products that contain nicotine or tobacco.  These products include cigarettes, chewing  tobacco, and vaping devices, such as e-cigarettes. Smoking keeps blood from reaching damaged nerves. If you need help quitting, ask your health care provider. Avoid or limit alcohol. Too much alcohol can cause a vitamin B deficiency, and vitamin B is needed for healthy nerves. Eat a healthy diet. This includes: Eating foods that are high in fiber, such as beans, whole grains, and fresh fruits and vegetables. Limiting foods that are high in fat and processed sugars, such as fried or sweet foods. General instructions  If you have diabetes, work closely with your health care provider to keep your blood sugar under control. If you have numbness in your feet: Check every day for signs of injury or infection. Watch for redness, warmth, and swelling. Wear padded socks and comfortable shoes. These help protect your feet. Develop a good support system. Living with peripheral neuropathy can be stressful. Consider talking with a mental health specialist or joining a support group. Use assistive devices and attend physical therapy as told by your health care provider. This may include using a walker or a cane. Keep all follow-up visits. This is important. Where to find more information [employer] of Neurological Disorders: [employer] Contact a health care provider if: You have new signs or symptoms of peripheral neuropathy. You are struggling emotionally from dealing with peripheral neuropathy. Your pain is not well controlled. Get help right away if: You have an injury or infection that is not healing normally. You develop new weakness in an arm or leg. You have fallen or do so frequently. Summary Peripheral neuropathy is when the nerves in the arms or legs are damaged, resulting in numbness, weakness, or pain. There are many causes of peripheral neuropathy, including diabetes, pinched nerves, vitamin deficiencies, autoimmune disease, and hereditary conditions. Diagnosing and finding  the cause of peripheral neuropathy can be difficult. Your health care provider will take your medical history, do a physical exam, and do tests, including blood tests and nerve function tests. Treatment involves treating the underlying cause of the neuropathy and taking medicines to help control pain. Physical therapy and assistive devices may also help. This information is not intended to replace advice given to you by your health care provider. Make sure you discuss any questions you have with your health care provider. Document Revised: 06/26/2021 Document Reviewed: 06/26/2021 Elsevier Patient Education  [place].

## 2022-07-29 NOTE — Progress Notes (Unsigned)
[facility]    Provider:  Dr Jaynee Eagles Requesting Provider: Ashok Pall, MD Primary Care Provider:  Lucianne Lei, MD  CC:  right leg pain  HPI:  Stephanie Espinoza is a 66 y.o. female here as requested by Ashok Pall, MD for for peripheral neuropathy.  Patient has a past medical history of right foot pain, right lumbar radiculopathy, degenerative disc disease lumbar, numbness and tingling in left arm, B12 deficiency, weakness of both legs, fatigue, history of syphilis, insomnia, memory loss, hyperglycemia, headache.  I reviewed Dr. Lacy Duverney notes, she has had multiple MRIs over the years, stable, spondylolisthesis at L5-S1 grade 1 almost bordering on grade 2, some foraminal compromise but nothing which explains why she has pain only in the leg and her right foot, she complains of a painful numbness in the right foot which is severe, she cannot bear weight on it, she cannot feel, she says, bowel and bladder incontinence, does not appear to be related to the spine.  EMG nerve conduction study was performed in the lower extremities.  She has no back pain or discomfort in the thighs.  No spinal stenosis.  Some foraminal narrowing is present, absolutely no pressure on the conus or cauda equina so the urinary incontinence and bowel issues are not coming from the lower spine.  I reviewed the EMG nerve conduction study which showed. Side effects to gabapentin, Lyrica and gabapentin. Tramadol helps. Only oxycodone helped in the past.    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 She has right and left foot numbness. No significant back pain, she has been to a chiropractor, feet hurting for over a year, as of July 5th gotten worse, she can;t walk or drive, Hurts on the top and the bottom of the  feet tingly and numb, around the toes and the ball of the feet, at night she cannot sleep because even the covers touching cause pain. When it first started happen she did not start any new medications, no chemotherapy, no falls, no new meds, no illnesses, nothing along those lines. Gradually worsening. She is adopted so no family history. No Numbness and tingling. Worsening is severity but not spreading and now is in both feet. Injections in the back helped for 2 months, no shooting pain down the legs from he back. No alcohol. NO incontinence of blowel or bladder, she has stress urgency but not incontinence.    Reviewed notes, labs and imaging from outside physicians, which showed:  Aug 8th 2018 MR l-spine: EXAM: MRI LUMBAR SPINE WITHOUT CONTRAST  TECHNIQUE: Multiplanar, multisequence MR imaging of the lumbar spine was performed. No intravenous contrast was administered.  COMPARISON: Prior radiograph from 06/11/2016.  FINDINGS: Segmentation: Normal segmentation. Lowest well-formed disc is labeled the L5-S1 level.  Alignment: Vertebral bodies normally aligned with preservation of the normal lumbar lordosis. No listhesis.  Vertebrae: Vertebral body heights are well maintained. No evidence for acute or chronic fracture. Signal intensity within the vertebral body bone marrow is normal. No discrete or worrisome osseous lesions. Reactive marrow edema about the bilateral L5-S1 facets due to facet degeneration. No other abnormal marrow edema.  Conus medullaris: Extends to the L2 level and appears normal.  Paraspinal and other soft tissues: Paraspinous soft tissues within normal limits. Visualized visceral structures are normal.  Disc levels:  L1-2: Unremarkable.  L2-3: Tiny right foraminal disc protrusion with associated annular fissure (series 3, image 6). No frank neural impingement. Mild bilateral facet hypertrophy. No canal or neural foraminal stenosis.  L3-4: Minimal annular  disc bulge. Mild bilateral facet and ligamentum flavum hypertrophy. No significant canal or foraminal stenosis.  L4-5: Minimal annular disc bulge. Mild to moderate facet and ligamentum flavum hypertrophy. No canal or foraminal stenosis.  L5-S1: Shallow posterior disc bulge. Advanced bilateral facet arthrosis with ligamentum flavum hypertrophy. Reactive effusions within the bilateral L5-S1 facets. Reactive marrow edema again noted. There is an associated synovial cyst at the medial aspect of the right L5-S1 facet measuring 8 x 2 mm (series 6, image 32). Resultant severe right lateral recess stenosis, with impingement of the descending right S1 nerve root by the synovial cyst and bulging disc. Moderate left lateral recess narrowing noted at this level as well. Foramina are widely patent.  IMPRESSION: 1. Severe right lateral recess stenosis with impingement on the descending right S1 nerve root due to disc bulge, facet arthropathy, and superimposed synovial cyst as above. 2. Tiny right foraminal disc protrusion at L2-3 without stenosis or neural impingement. 3. Additional mild bilateral facet hypertrophy at L2-3 through L4-5.  MRI 05/06/2021: EXAM: MRI LUMBAR SPINE WITHOUT CONTRAST  TECHNIQUE: Multiplanar, multisequence MR imaging of the lumbar spine was performed. No intravenous contrast was administered.  COMPARISON: 06/12/2017 and radiographs from 03/30/2021  FINDINGS: Segmentation: The lowest lumbar type non-rib-bearing vertebra is labeled as L5.  Alignment: 5 mm degenerative anterolisthesis at L5-S1. This is substantially increased from 06/12/2017 but stable from 03/30/2021.  Vertebrae: Disc desiccation primarily at L5-S1 and L1-2 with mild loss of intervertebral disc height. Mild degenerative facet edema bilaterally at L5-S1  Conus medullaris and cauda equina: Conus extends to the lower L2 level, mildly abnormally low. No tethering lesion or mass is identified. The  conus medullaris appears otherwise unremarkable..  Paraspinal and other soft tissues: Unremarkable  Disc levels:  T12-L1: Unremarkable.  L1-2: No impingement. Mild disc bulge.  L2-3: Unremarkable.  L3-4: No impingement. Mild disc bulge. Mild degenerative facet arthropathy.  L4-5: No impingement. Mild disc bulge and mild facet arthropathy.  L5-S1: Moderate to prominent left and moderate right foraminal stenosis with moderate bilateral subarticular lateral recess stenosis due to disc uncovering, left foraminal disc protrusion, mild disc bulge, and facet arthropathy. Substantially worsened compared to 06/12/2017.  IMPRESSION: 1. Lumbar spondylosis and degenerative disc disease in conjunction with degenerative anterolisthesis contributes to moderate to prominent impingement at the L5-S1 level. 2. Mild disc bulges at other lumbar levels do not cause significant impingement. 3. Mildly abnormally low conus at the lower L2 level. I do not see a tethering mass or specific abnormality along the filum terminale or cauda equina.   EXAM: MRI LUMBAR SPINE WITHOUT CONTRAST 05/2022  TECHNIQUE: Multiplanar, multisequence MR imaging of the lumbar spine was performed. No intravenous contrast was administered.  COMPARISON: MRI lumbar spine 05/04/2021. Radiographs 03/30/2021.  FINDINGS: Segmentation: Conventional anatomy assumed, with the last open disc space designated L5-S1.Concordant with previous imaging.  Alignment: 5 mm of degenerative anterolisthesis at L5-S1, unchanged from previous MRI. Otherwise normal.  Vertebrae: No worrisome osseous lesion, acute fracture or pars defect. The visualized sacroiliac joints appear unremarkable.  Conus medullaris: Extends to the lower L2 level and appears normal.  Paraspinal and other soft tissues: No significant paraspinal findings.  Disc levels:  L1-2: Mild loss of disc height with mild disc bulging. No disc herniation, spinal  stenosis  or nerve root encroachment.  L2-3: Normal interspace.  L3-4: Disc height and hydration are maintained. Mild bilateral facet hypertrophy. No spinal stenosis or nerve root encroachment.  L4-5: Disc height and hydration are maintained. Mild to moderate facet and ligamentous hypertrophy. No spinal stenosis or nerve root encroachment.  L5-S1: Stable loss of disc height with disc desiccation and annular disc bulging. There is advanced bilateral facet hypertrophy accounting for the grade 1 anterolisthesis. There is resulting mild spinal stenosis and mild lateral recess narrowing bilaterally. At least moderate left greater than right foraminal narrowing is present with potential encroachment on either exiting L5 nerve root.  IMPRESSION: 1. No acute findings or significant change compared with prior study from 1 year ago. 2. Advanced facet hypertrophy at L5-S1 with resulting grade 1 anterolisthesis, annular disc bulging and at least moderate left greater than right foraminal narrowing. There is possible chronic encroachment on either L5 nerve root. 3. No other significant spinal stenosis or nerve root encroachment. 4. Low lying conus again noted without evidence of cord tethering.   Labs: 05/2022: ANA comprehensive panel negative, CMP unremarkable slightly elevated glucose, CBC with mild anemia, HIV+ untreated (04/2022: RNA 18,600, quant), hgba1c 5.9, b12 has been normal, 02/2022 vitan D normal, cd4 < 200 (considered aids?)  MRi brain in 03/2021: COMPARISON:  Prior brain MRI 03/09/2020.   FINDINGS: Brain:   Mild cerebral and cerebellar atrophy.   Redemonstrated patchy T2/FLAIR hyperintensity within the bilateral cerebral white matter nonspecific but most often secondary to chronic small vessel ischemia. A component of HIV encephalopathy is difficult to exclude.   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   There is no acute infarct.   No  evidence of intracranial mass.   No chronic intracranial blood products.   No extra-axial fluid collection.   No midline shift.   Vascular: Expected proximal arterial flow voids.   Skull and upper cervical spine: No focal marrow lesion.   Sinuses/Orbits: Visualized orbits show no acute finding. Trace bilateral ethmoid sinus mucosal thickening.   IMPRESSION: No evidence of acute intracranial abnormality, including acute infarction.   Redemonstrated patchy T2/FLAIR hyperintense signal changes within the cerebral white matter, nonspecific but most often secondary to chronic small vessel ischemia. A component of early HIV encephalopathy is difficult to exclude.   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   Mild bilateral ethmoid sinus mucosal thickening.  Review of Systems: Patient complains of symptoms per HPI as well as the following symptoms ***. Pertinent negatives and positives per HPI. All others negative.   Social History   Socioeconomic History   Marital status: Divorced    Spouse name: Not on file   Number of children: Not on file   Years of education: Not on file   Highest education level: Not on file  Occupational History   Not on file  Tobacco Use   Smoking status: Former    Types: Cigarettes    Quit date: 03/01/1990    Years since quitting: 32.4   Smokeless tobacco: Never  Vaping Use   Vaping Use: Never used  Substance and Sexual Activity   Alcohol use: No   Drug use: No   Sexual activity: Not Currently    Partners: Male    Birth control/protection: Post-menopausal  Other Topics Concern   Not on file  Social History Narrative   Caffiene;  not really   Education: Masters   Working Research officer, trade union.  Remotely.   Cant drive.   Social Determinants  of Health   Financial Resource Strain: Not on file  Food Insecurity: Not on file  Transportation Needs: Not on file  Physical Activity: Not on file  Stress: Not on  file  Social Connections: Not on file  Intimate Partner Violence: Not on file    Family History  Problem Relation Age of Onset   Lung cancer Mother    Diabetes Father    Breast cancer Other     Past Medical History:  Diagnosis Date   Abnormal Pap smear of cervix before 1995   always a repeat back to normal except for 1 time and had colpo bioosy -resutls were normal   Allergic rhinitis    Anxiety    Bell's palsy after 1995 ?   no residual SE.   Depression    Genital herpes    History of drug abuse ([place])    drug usser [place].  cocaine   HIV (human immunodeficiency virus infection) ([place]) 1990   from sexual contact who is unknown   Syphilis    treated    Patient Active Problem List   Diagnosis Date Noted   Right foot pain 05/22/2022   Right lumbar radiculopathy 05/08/2022   Abnormal CXR 02/15/2022   DDD (degenerative disc disease), lumbar 02/12/2022   Anterolisthesis of lumbosacral spine 02/12/2022   Allergic reaction to bee sting 07/27/2021   Numbness and tingling in left arm 05/31/2021   Weakness of both legs 03/07/2021   B12 deficiency 03/07/2021   COVID 11/09/2020   Urinary incontinence 05/16/2020   Other fatigue 05/16/2020   History of syphilis 05/14/2020   Memory loss 02/14/2020   Insomnia 02/14/2020   Herpes zoster 12/31/2019   Normocytic anemia 03/01/2019   Hyperglycemia 03/01/2019   Obesity (BMI 30.0-34.9) 03/01/2019   Headache 06/30/2018   Urinary frequency 05/01/2018   Sciatica 05/26/2017   Numbness and tingling of both legs 05/10/2017   Cough 11/05/2016   Cervical 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 (HPV) DNA test positive 10/18/2016   Chronic bilateral low back pain with bilateral sciatica 06/12/2016   Essential hypertension 09/16/2014   Vasomotor rhinitis 05/05/2014   Benign paroxysmal positional vertigo 09/21/2012   Sinusitis 03/22/2009   Human immunodeficiency virus (HIV) disease ([place]) 09/06/2006   GENITAL HERPES 09/06/2006   Depression  09/06/2006   PAP SMEAR, ABNORMAL 09/06/2006    Past Surgical History:  Procedure Laterality Date   CESAREAN SECTION  1997   ESOPHAGOGASTRODUODENOSCOPY (EGD) WITH PROPOFOL N/A 03/02/2019   Procedure: ESOPHAGOGASTRODUODENOSCOPY (EGD) WITH PROPOFOL;  Surgeon: Arta Silence, MD;  Location: [facility];  Service: Endoscopy;  Laterality: N/A;   EUS N/A 03/02/2019   Procedure: FULL UPPER ENDOSCOPIC ULTRASOUND (EUS) RADIAL;  Surgeon: Arta Silence, MD;  Location: [facility];  Service: Endoscopy;  Laterality: N/A;   LAPAROSCOPIC CHOLECYSTECTOMY SINGLE PORT N/A 03/01/2019   Procedure: LAPAROSCOPIC CHOLECYSTECTOMY SINGLE SITE/ INTRAOPERATIVE CHOLANGIOGRAM;  Surgeon: Michael Boston, MD;  Location: [facility];  Service: General;  Laterality: N/A;    Current Outpatient Medications  Medication Sig Dispense Refill   acyclovir ointment (ZOVIRAX) 5 % Apply topically every 3 (three) hours. Use as needed for symptoms relief for no more than 7 days. 15 g 3   albuterol (VENTOLIN HFA) 108 (90 Base) MCG/ACT inhaler Inhale 1-2 puffs into the lungs every 4 (four) hours as needed for wheezing or shortness of breath. 6.7 each 2   cyclobenzaprine (FLEXERIL) 5 MG tablet Take 1 tablet (5 mg total) by mouth 3 (three) times daily as needed for muscle  spasms. 30 tablet 1   EPINEPHrine (EPIPEN 2-PAK) 0.3 mg/0.3 mL IJ SOAJ injection Inject 0.3 mg into the muscle as needed for anaphylaxis. 1 each 2   fluticasone (FLONASE) 50 MCG/ACT nasal spray Place 2 sprays into both nostrils daily. 48 mL 2   lidocaine (LIDODERM) 5 % Place 1 patch onto the skin daily. Remove & Discard patch within 12 hours or as directed by MD 30 patch 0   sulfamethoxazole-trimethoprim (BACTRIM DS) 800-160 MG tablet Take 1 tablet by mouth daily. 30 tablet 11   traMADol (ULTRAM) 50 MG tablet Take 50 mg by mouth every 4 (four) hours as needed.     valACYclovir (VALTREX) 500 MG tablet Take 1 tablet (500 mg total) by mouth daily. Increase to bid x 3 days with any  symptoms 90 tablet 4   HYDROcodone-acetaminophen (NORCO/VICODIN) 5-325 MG tablet Take 1 tablet by mouth every 6 (six) hours as needed for moderate pain. (Patient not taking: Reported on 07/29/2022) 30 tablet 0   No current facility-administered medications for this visit.    Allergies as of 07/29/2022 - Review Complete 07/29/2022  Allergen Reaction Noted   Gabapentin  07/29/2022   Haldol [haloperidol] Other (See Comments) 01/10/2015   Nevirapine  09/01/2007   Thorazine [chlorpromazine] Other (See Comments) 02/28/2019    Vitals: BP (!) 157/102 Comment: states high due to machine  Pulse (!) 105   Ht '5\' 2"'$  (1.575 m)   Wt 145 lb 3.2 oz (65.9 kg)   LMP 11/04/1997   BMI 26.56 kg/m  Last Weight:  Wt Readings from Last 1 Encounters:  07/29/22 145 lb 3.2 oz (65.9 kg)   Last Height:   Ht Readings from Last 1 Encounters:  07/29/22 '5\' 2"'$  (1.575 m)     Physical exam: Exam: Gen: NAD, conversant, well nourised, obese, well groomed                     CV: RRR, no MRG. No Carotid Bruits. No peripheral edema, warm, nontender Eyes: Conjunctivae clear without exudates or hemorrhage  Neuro: Detailed Neurologic Exam  Speech:    Speech is normal; fluent and spontaneous with normal comprehension.  Cognition:    The patient is oriented to person, place, and time;     recent and remote memory intact;     language fluent;     normal attention, concentration,     fund of knowledge Cranial Nerves:    The pupils are equal, round, and reactive to light. The fundi are normal and spontaneous venous pulsations are present. Visual fields are full to finger confrontation. Extraocular movements are intact. Trigeminal sensation is intact and the muscles of mastication are normal. The face is symmetric. The palate elevates in the midline. Hearing intact. Voice is normal. Shoulder shrug is normal. The tongue has normal motion without fasciculations.   Coordination:    Normal finger to nose and heel to  shin.  Gait:    Wide based, sensory atxia.   Motor Observation:    No asymmetry, no atrophy, and no involuntary movements noted. Tone:    Normal muscle tone.    Posture:    Posture is normal. normal erect    Strength: weakness of bilateral foot dorsiflexion and plantar flexion 3/5. Some miled but symmetrical hip flexion weakness 4/5.  Strength is V/V in the upper and lower limbs otherwise.      Sensation: decreased temp and pin prick to mid calf bilaterally, absent proprioception at the toe and absent vibration  to the medial malleoli     Reflex Exam:  DTR's: absent AJs, brisk patellars, 1+ biceps. Symmetrical.  Deep tendon reflexes in the upper and lower extremities are normal bilaterally.   Toes:    The toes are downgoing bilaterally.   Clonus:    Clonus is absent.    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   No orders of the defined types were placed in this encounter.  No orders of the defined types were placed in this encounter.   Cc: Ashok Pall, MD,  Lucianne Lei, MD  Sarina Ill, MD  [facility] [address] [place], [place] [address]  Phone [phone] Fax [phone]

## 2022-07-30 ENCOUNTER — Telehealth: Payer: Self-pay | Admitting: *Deleted

## 2022-07-30 ENCOUNTER — Encounter: Payer: Self-pay | Admitting: Neurology

## 2022-07-30 NOTE — Telephone Encounter (Signed)
Pt emg report in nurse pod. From [facility]

## 2022-07-31 ENCOUNTER — Telehealth: Payer: Self-pay | Admitting: Neurology

## 2022-07-31 NOTE — Telephone Encounter (Signed)
Referral emailed to [email or web address] as instructed by [place] to resend due to failed fax by Guy Begin

## 2022-07-31 NOTE — Telephone Encounter (Signed)
Faxed referral to Dr. Greta Doom at [place], phone # [phone].

## 2022-08-02 ENCOUNTER — Encounter: Payer: Self-pay | Admitting: Podiatry

## 2022-08-02 ENCOUNTER — Telehealth: Payer: Self-pay

## 2022-08-02 NOTE — Telephone Encounter (Signed)
Reached out to patient regarding the following message; Appointment Request From: Marquis Lunch   With Provider: Michel Bickers, MD [facility]] Preferred Date Range: 08/05/2022 - 08/12/2022  Preferred Times: Any Time Reason for visit: Office Visit Comments: Infections like foot, and neuropathy,  weakness   States that she has concerns for infection on left foot toe nails. Has been soaking nails.  Not using anything  at the moment. Has reached out to podiatry. Encouraged her to follow up with PCP today. Advised if gets worse to go to Urgent care this weekend. Leatrice Jewels, RMA

## 2022-08-06 NOTE — Telephone Encounter (Signed)
Pt scheduled for next week. Still having some foot pain. Is soaking her feet in vinegar and epsom salt. Leatrice Jewels, RMA

## 2022-08-07 LAB — VITAMIN B6: Vitamin B6: 33 ug/L (ref 3.4–65.2)

## 2022-08-07 LAB — FOLATE: Folate: >20 ng/mL (ref >3.0)

## 2022-08-07 LAB — VITAMIN B1: Thiamine: 186 nmol/L (ref 66.5–200.0)

## 2022-08-08 ENCOUNTER — Other Ambulatory Visit (HOSPITAL_BASED_OUTPATIENT_CLINIC_OR_DEPARTMENT_OTHER): Payer: Self-pay | Admitting: Obstetrics & Gynecology

## 2022-08-09 ENCOUNTER — Encounter (HOSPITAL_BASED_OUTPATIENT_CLINIC_OR_DEPARTMENT_OTHER): Payer: Self-pay

## 2022-08-09 ENCOUNTER — Ambulatory Visit (HOSPITAL_BASED_OUTPATIENT_CLINIC_OR_DEPARTMENT_OTHER): Payer: BC Managed Care – PPO | Admitting: Obstetrics & Gynecology

## 2022-08-12 ENCOUNTER — Encounter: Payer: Self-pay | Admitting: Neurology

## 2022-08-14 ENCOUNTER — Ambulatory Visit (INDEPENDENT_AMBULATORY_CARE_PROVIDER_SITE_OTHER): Payer: BC Managed Care – PPO | Admitting: Internal Medicine

## 2022-08-14 ENCOUNTER — Other Ambulatory Visit: Payer: Self-pay

## 2022-08-14 ENCOUNTER — Ambulatory Visit: Payer: BC Managed Care – PPO | Admitting: Podiatry

## 2022-08-14 ENCOUNTER — Encounter: Payer: Self-pay | Admitting: Internal Medicine

## 2022-08-14 DIAGNOSIS — M79674 Pain in right toe(s): Secondary | ICD-10-CM

## 2022-08-14 DIAGNOSIS — M79675 Pain in left toe(s): Secondary | ICD-10-CM

## 2022-08-14 DIAGNOSIS — B20 Human immunodeficiency virus [HIV] disease: Secondary | ICD-10-CM | POA: Diagnosis not present

## 2022-08-14 DIAGNOSIS — G629 Polyneuropathy, unspecified: Secondary | ICD-10-CM | POA: Diagnosis not present

## 2022-08-14 DIAGNOSIS — L309 Dermatitis, unspecified: Secondary | ICD-10-CM

## 2022-08-14 DIAGNOSIS — B351 Tinea unguium: Secondary | ICD-10-CM | POA: Diagnosis not present

## 2022-08-14 NOTE — Progress Notes (Signed)
Subjective:   Patient ID: Stephanie Espinoza, female   DOB: 66 y.o.   MRN: [id number]   HPI Patient presents with nail disease 1-5 both feet with patient having neuropathy which complicates this for her and has skin irritation third digit left inside of the digit    ROS      Objective:  Physical Exam  Neurovascular status found to be intact muscle strength found to be adequate incurvated nailbeds 1-5 both feet with diminishment sharp dull vibratory and irritation of the third digit left     Assessment:  Mycotic nail infection with pain 1-5 both feet along with irritation of the third digit left foot     Plan:  H&P reviewed all conditions debrided nailbeds 1-5 both feet no iatrogenic bleeding and will start Lamisil cream third digit and this is at risk condition

## 2022-08-14 NOTE — Assessment & Plan Note (Signed)
As expected her CD4 has gradually declined over the past several years as she has been unable to stay on consistent antiretroviral therapy.  I will see her back in mid December at which time she says she is willing to restart antiretroviral therapy.

## 2022-08-14 NOTE — Progress Notes (Signed)
Patient Active Problem List   Diagnosis Date Noted   Human immunodeficiency virus (HIV) disease ([place]) 09/06/2006    Priority: High   GENITAL HERPES 09/06/2006    Priority: Medium    Right foot pain 05/22/2022   Right lumbar radiculopathy 05/08/2022   Abnormal CXR 02/15/2022   DDD (degenerative disc disease), lumbar 02/12/2022   Anterolisthesis of lumbosacral spine 02/12/2022   Allergic reaction to bee sting 07/27/2021   Numbness and tingling in left arm 05/31/2021   Weakness of both legs 03/07/2021   B12 deficiency 03/07/2021   COVID 11/09/2020   Urinary incontinence 05/16/2020   Other fatigue 05/16/2020   History of syphilis 05/14/2020   Memory loss 02/14/2020   Insomnia 02/14/2020   Herpes zoster 12/31/2019   Normocytic anemia 03/01/2019   Hyperglycemia 03/01/2019   Obesity (BMI 30.0-34.9) 03/01/2019   Headache 06/30/2018   Urinary frequency 05/01/2018   Sciatica 05/26/2017   Numbness and tingling of both legs 05/10/2017   Cough 11/05/2016   Cervical 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 (HPV) DNA test positive 10/18/2016   Chronic bilateral low back pain with bilateral sciatica 06/12/2016   Essential hypertension 09/16/2014   Vasomotor rhinitis 05/05/2014   Benign paroxysmal positional vertigo 09/21/2012   Sinusitis 03/22/2009   Depression 09/06/2006   PAP SMEAR, ABNORMAL 09/06/2006    Patient's Medications  New Prescriptions   No medications on file  Previous Medications   ACYCLOVIR OINTMENT (ZOVIRAX) 5 %    Apply topically every 3 (three) hours. Use as needed for symptoms relief for no more than 7 days.   ALBUTEROL (VENTOLIN HFA) 108 (90 BASE) MCG/ACT INHALER    Inhale 1-2 puffs into the lungs every 4 (four) hours as needed for wheezing or shortness of breath.   CYCLOBENZAPRINE (FLEXERIL) 5 MG TABLET    Take 1 tablet (5 mg total) by mouth 3 (three) times daily as needed for muscle spasms.   EPINEPHRINE (EPIPEN 2-PAK) 0.3 MG/0.3 ML IJ SOAJ INJECTION     Inject 0.3 mg into the muscle as needed for anaphylaxis.   FLUTICASONE (FLONASE) 50 MCG/ACT NASAL SPRAY    Place 2 sprays into both nostrils daily.   IRON-FA-B CMP-C-BIOT-PROBIOTIC (FUSION PLUS) CAPS    Take 1 capsule by mouth daily.   LIDOCAINE (LIDODERM) 5 %    Place 1 patch onto the skin daily. Remove & Discard patch within 12 hours or as directed by MD   SULFAMETHOXAZOLE-TRIMETHOPRIM (BACTRIM DS) 800-160 MG TABLET    Take 1 tablet by mouth daily.   TRAMADOL (ULTRAM) 50 MG TABLET    Take 50 mg by mouth every 4 (four) hours as needed.   VALACYCLOVIR (VALTREX) 500 MG TABLET    TAKE 1 TABLET (500 MG TOTAL) BY MOUTH DAILY. INCREASE TO TWICE DAILY X 3 DAYS WITH ANY SYMPTOMS  Modified Medications   No medications on file  Discontinued Medications   No medications on file    Subjective: Stephanie Espinoza is in for her routine follow-up visit.  She tells me that she was recently diagnosed with neuropathy and placed on pregabalin.  She took it for 3 days but then had swelling of her feet and lower legs and stopped it.  The swelling has gotten better.  She has been taking some tramadol but is still having lower extremity discomfort.  She is planning on retiring on 10/15/2022.  She says that she will be ready to restart antiretroviral therapy once she is no longer working.  She has been taking her prophylactic trimethoprim sulfamethoxazole.  Review of Systems: Review of Systems  Constitutional:  Negative for weight loss.  Neurological:  Positive for sensory change.    Past Medical History:  Diagnosis Date   Abnormal Pap smear of cervix before 1995   always a repeat back to normal except for 1 time and had colpo bioosy -resutls were normal   Allergic rhinitis    Anxiety    Bell's palsy after 1995 ?   no residual SE.   Depression    Genital herpes    History of drug abuse ([place])    drug usser [place].  cocaine   HIV (human immunodeficiency virus infection) ([place]) 1990   from sexual contact who is  unknown   Syphilis    treated    Social History   Tobacco Use   Smoking status: Former    Types: Cigarettes    Quit date: 03/01/1990    Years since quitting: 32.4   Smokeless tobacco: Never  Vaping Use   Vaping Use: Never used  Substance Use Topics   Alcohol use: No   Drug use: No    Family History  Problem Relation Age of Onset   Lung cancer Mother    Diabetes Father    Breast cancer Other     Allergies  Allergen Reactions   Gabapentin     Swelling,  (Pt states was pre-gabapentin)   Haldol [Haloperidol] Other (See Comments)    Wipes out her memory   Nevirapine     REACTION: Rash 10/08   Thorazine [Chlorpromazine] Other (See Comments)    "makes me comatose"    Health Maintenance  Topic Date Due   COVID-19 Vaccine (4 - [place] risk series) 09/29/2021   INFLUENZA VACCINE  06/04/2022   MAMMOGRAM  08/03/2023   PAP SMEAR-Modifier  08/02/2024   Fecal DNA (Cologuard)  08/14/2024   TETANUS/TDAP  10/10/2026   Hepatitis C Screening  Completed   HIV Screening  Completed   Zoster Vaccines- Shingrix  Completed   HPV VACCINES  Aged Out    Objective:  Vitals:   08/14/22 1006  BP: 128/86  Pulse: 97  Temp: 98.1 F (36.7 C)  TempSrc: Oral  SpO2: 98%  Weight: 141 lb (64 kg)   Body mass index is 25.79 kg/m.  Physical Exam Constitutional:      Comments: She is in good spirits.  Cardiovascular:     Rate and Rhythm: Normal rate.  Pulmonary:     Effort: Pulmonary effort is normal.  Psychiatric:        Mood and Affect: Mood normal.     Lab Results Lab Results  Component Value Date   WBC 4.5 05/11/2022   HGB 10.4 (L) 05/11/2022   HCT 34.1 (L) 05/11/2022   MCV 87.9 05/11/2022   PLT 265 05/11/2022    Lab Results  Component Value Date   CREATININE 1.03 (H) 05/11/2022   BUN 16 05/11/2022   NA 138 05/11/2022   K 3.9 05/11/2022   CL 104 05/11/2022   CO2 23 05/11/2022    Lab Results  Component Value Date   ALT 21 05/11/2022   AST 26 05/11/2022    ALKPHOS 95 05/11/2022   BILITOT 0.6 05/11/2022    Lab Results  Component Value Date   CHOL 161 10/10/2021   HDL 37 (L) 10/10/2021   LDLCALC 99 10/10/2021   LDLDIRECT 102.1 03/02/2009   TRIG 149 10/10/2021   CHOLHDL 4.4 10/10/2021  Lab Results  Component Value Date   LABRPR NON-REACTIVE 03/07/2021   HIV 1 RNA Quant (Copies/mL)  Date Value  05/01/2022 18,600 (H)  02/04/2022 8,350 (H)  10/10/2021 15,800 (H)   CD4 T Cell Abs (/uL)  Date Value  05/01/2022 168 (L)  10/10/2021 137 (L)  05/08/2021 273 (L)     Problem List Items Addressed This Visit       High   Human immunodeficiency virus (HIV) disease ([place])    As expected her CD4 has gradually declined over the past several years as she has been unable to stay on consistent antiretroviral therapy.  I will see her back in mid December at which time she says she is willing to restart antiretroviral therapy.         Michel Bickers, MD [facility] [phone] pager   [phone] cell 08/14/2022, 10:21 AM

## 2022-08-16 ENCOUNTER — Other Ambulatory Visit: Payer: Self-pay | Admitting: Internal Medicine

## 2022-08-16 NOTE — Telephone Encounter (Signed)
Please advise 

## 2022-08-16 NOTE — Telephone Encounter (Signed)
Called patient, no answer, left voice message per physician that the foot should be improving.

## 2022-08-19 ENCOUNTER — Encounter: Payer: Self-pay | Admitting: Internal Medicine

## 2022-08-19 ENCOUNTER — Encounter: Payer: Self-pay | Admitting: Neurology

## 2022-08-21 ENCOUNTER — Encounter: Payer: Self-pay | Admitting: Neurology

## 2022-08-28 NOTE — Telephone Encounter (Signed)
Referral emailed to [email or web address] to [facility].  Phone:[phone].

## 2022-08-28 NOTE — Telephone Encounter (Signed)
I called and LMVM for Dr. Greta Doom office to return call about referral.

## 2022-08-28 NOTE — Telephone Encounter (Signed)
I called the # listed and LMVM #[phone] that calling about referral for pt.  They are out to lunch from [phone].

## 2022-08-28 NOTE — Telephone Encounter (Signed)
LVM for [facility] Referral Coordinator to call back about referral sent in September

## 2022-08-29 NOTE — Telephone Encounter (Signed)
[facility] states the referral has been received and is under review by the doctor. They cannot schedule until the referral is approved. The reason for the delay is that another pain management office closed down and they are taking their patients.

## 2022-08-29 NOTE — Telephone Encounter (Signed)
I've called and left another VM for the referral coordinator at [facility]. I was able to get in touch with the scheduling department. I was told they did get the patient's referral and it has been given to the doctor for review. The reason for the delay is that another pain clinic closed down and referred patients over to [facility].

## 2022-09-02 ENCOUNTER — Other Ambulatory Visit (HOSPITAL_BASED_OUTPATIENT_CLINIC_OR_DEPARTMENT_OTHER): Payer: Self-pay | Admitting: Obstetrics & Gynecology

## 2022-09-05 ENCOUNTER — Other Ambulatory Visit: Payer: Self-pay | Admitting: Neurology

## 2022-09-05 DIAGNOSIS — B20 Human immunodeficiency virus [HIV] disease: Secondary | ICD-10-CM

## 2022-09-05 NOTE — Telephone Encounter (Signed)
Referral sent to [facility], phone # [phone]. Gave phone # to pt and informed her where referral was sent.

## 2022-09-09 NOTE — Telephone Encounter (Signed)
Patient has been scheduled with [facility] for 10/10/22 at 10:20am [place] location.

## 2022-10-01 ENCOUNTER — Encounter: Payer: Self-pay | Admitting: Internal Medicine

## 2022-10-03 ENCOUNTER — Other Ambulatory Visit: Payer: Self-pay

## 2022-10-03 DIAGNOSIS — Z113 Encounter for screening for infections with a predominantly sexual mode of transmission: Secondary | ICD-10-CM

## 2022-10-03 DIAGNOSIS — Z79899 Other long term (current) drug therapy: Secondary | ICD-10-CM

## 2022-10-03 DIAGNOSIS — B20 Human immunodeficiency virus [HIV] disease: Secondary | ICD-10-CM

## 2022-10-04 ENCOUNTER — Other Ambulatory Visit: Payer: Self-pay

## 2022-10-04 ENCOUNTER — Other Ambulatory Visit: Payer: BC Managed Care – PPO

## 2022-10-04 DIAGNOSIS — Z113 Encounter for screening for infections with a predominantly sexual mode of transmission: Secondary | ICD-10-CM

## 2022-10-04 DIAGNOSIS — B20 Human immunodeficiency virus [HIV] disease: Secondary | ICD-10-CM

## 2022-10-04 DIAGNOSIS — Z79899 Other long term (current) drug therapy: Secondary | ICD-10-CM

## 2022-10-07 ENCOUNTER — Other Ambulatory Visit: Payer: BC Managed Care – PPO

## 2022-10-07 LAB — LIPID PANEL
Cholesterol: 183 mg/dL (ref <200)
HDL: 41 mg/dL — ABNORMAL LOW (ref >=50)
LDL Cholesterol (Calc): 117 mg/dL (calc) — ABNORMAL HIGH
Non-HDL Cholesterol (Calc): 142 mg/dL (calc) — ABNORMAL HIGH (ref <130)
Total CHOL/HDL Ratio: 4.5 (calc) (ref <5.0)
Triglycerides: 136 mg/dL (ref <150)

## 2022-10-07 LAB — COMPLETE METABOLIC PANEL WITH GFR
AG Ratio: 1.1 (calc) (ref 1.0–2.5)
ALT: 11 U/L (ref 6–29)
AST: 19 U/L (ref 10–35)
Albumin: 4 g/dL (ref 3.6–5.1)
Alkaline phosphatase (APISO): 84 U/L (ref 37–153)
BUN/Creatinine Ratio: 12 (calc) (ref 6–22)
BUN: 14 mg/dL (ref 7–25)
CO2: 27 mmol/L (ref 20–32)
Calcium: 9.6 mg/dL (ref 8.6–10.4)
Chloride: 106 mmol/L (ref 98–110)
Creat: 1.19 mg/dL — ABNORMAL HIGH (ref 0.50–1.05)
Globulin: 3.8 g/dL (calc) — ABNORMAL HIGH (ref 1.9–3.7)
Glucose, Bld: 92 mg/dL (ref 65–99)
Potassium: 4.3 mmol/L (ref 3.5–5.3)
Sodium: 139 mmol/L (ref 135–146)
Total Bilirubin: 0.7 mg/dL (ref 0.2–1.2)
Total Protein: 7.8 g/dL (ref 6.1–8.1)
eGFR: 51 mL/min/{1.73_m2} — ABNORMAL LOW (ref >=60)

## 2022-10-07 LAB — CBC WITH DIFFERENTIAL/PLATELET
Absolute Monocytes: 366 cells/uL (ref 200–950)
Basophils Absolute: 22 cells/uL (ref 0–200)
Basophils Relative: 0.7 %
Eosinophils Absolute: 71 cells/uL (ref 15–500)
Eosinophils Relative: 2.3 %
HCT: 35.3 % (ref 35.0–45.0)
Hemoglobin: 11.1 g/dL — ABNORMAL LOW (ref 11.7–15.5)
Lymphs Abs: 1398 cells/uL (ref 850–3900)
MCH: 27.2 pg (ref 27.0–33.0)
MCHC: 31.4 g/dL — ABNORMAL LOW (ref 32.0–36.0)
MCV: 86.5 fL (ref 80.0–100.0)
MPV: 10.2 fL (ref 7.5–12.5)
Monocytes Relative: 11.8 %
Neutro Abs: 1243 cells/uL — ABNORMAL LOW (ref 1500–7800)
Neutrophils Relative %: 40.1 %
Platelets: 187 10*3/uL (ref 140–400)
RBC: 4.08 10*6/uL (ref 3.80–5.10)
RDW: 13.1 % (ref 11.0–15.0)
Total Lymphocyte: 45.1 %
WBC: 3.1 10*3/uL — ABNORMAL LOW (ref 3.8–10.8)

## 2022-10-07 LAB — T-HELPER CELLS (CD4) COUNT (NOT AT ARMC)
Absolute CD4: 97 cells/uL — ABNORMAL LOW (ref 490–1740)
CD4 T Helper %: 7 % — ABNORMAL LOW (ref 30–61)
Total lymphocyte count: 1344 cells/uL (ref 850–3900)

## 2022-10-07 LAB — RPR: RPR Ser Ql: NONREACTIVE

## 2022-10-07 LAB — HIV-1 RNA QUANT-NO REFLEX-BLD
HIV 1 RNA Quant: 58100 Copies/mL — ABNORMAL HIGH
HIV-1 RNA Quant, Log: 4.76 Log cps/mL — ABNORMAL HIGH

## 2022-10-15 ENCOUNTER — Other Ambulatory Visit: Payer: Self-pay | Admitting: Internal Medicine

## 2022-10-15 ENCOUNTER — Encounter: Payer: Self-pay | Admitting: Internal Medicine

## 2022-10-16 ENCOUNTER — Other Ambulatory Visit: Payer: Self-pay

## 2022-10-17 ENCOUNTER — Ambulatory Visit (INDEPENDENT_AMBULATORY_CARE_PROVIDER_SITE_OTHER): Payer: BC Managed Care – PPO | Admitting: Internal Medicine

## 2022-10-17 ENCOUNTER — Other Ambulatory Visit: Payer: Self-pay

## 2022-10-17 ENCOUNTER — Telehealth: Payer: Self-pay

## 2022-10-17 ENCOUNTER — Encounter: Payer: Self-pay | Admitting: Internal Medicine

## 2022-10-17 ENCOUNTER — Other Ambulatory Visit (HOSPITAL_COMMUNITY): Payer: Self-pay

## 2022-10-17 VITALS — BP 115/77 | HR 78 | Temp 98.3°F | Ht 62.0 in | Wt 147.0 lb

## 2022-10-17 DIAGNOSIS — B20 Human immunodeficiency virus [HIV] disease: Secondary | ICD-10-CM | POA: Diagnosis not present

## 2022-10-17 MED ORDER — BICTEGRAVIR-EMTRICITAB-TENOFOV 50-200-25 MG PO TABS: 1.0000 | ORAL_TABLET | Freq: Every day | ORAL | 11 refills | Status: DC

## 2022-10-17 NOTE — Progress Notes (Signed)
Patient Active Problem List   Diagnosis Date Noted   Human immunodeficiency virus (HIV) disease ([place]) 09/06/2006    Priority: High   GENITAL HERPES 09/06/2006    Priority: Medium    Right foot pain 05/22/2022   Right lumbar radiculopathy 05/08/2022   Abnormal CXR 02/15/2022   DDD (degenerative disc disease), lumbar 02/12/2022   Anterolisthesis of lumbosacral spine 02/12/2022   Allergic reaction to bee sting 07/27/2021   Numbness and tingling in left arm 05/31/2021   Weakness of both legs 03/07/2021   B12 deficiency 03/07/2021   COVID 11/09/2020   Urinary incontinence 05/16/2020   Other fatigue 05/16/2020   History of syphilis 05/14/2020   Memory loss 02/14/2020   Insomnia 02/14/2020   Herpes zoster 12/31/2019   Normocytic anemia 03/01/2019   Hyperglycemia 03/01/2019   Obesity (BMI 30.0-34.9) 03/01/2019   Headache 06/30/2018   Urinary frequency 05/01/2018   Sciatica 05/26/2017   Numbness and tingling of both legs 05/10/2017   Cough 11/05/2016   Cervical 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 (HPV) DNA test positive 10/18/2016   Chronic bilateral low back pain with bilateral sciatica 06/12/2016   Essential hypertension 09/16/2014   Vasomotor rhinitis 05/05/2014   Benign paroxysmal positional vertigo 09/21/2012   Sinusitis 03/22/2009   Depression 09/06/2006   PAP SMEAR, ABNORMAL 09/06/2006    Patient's Medications  New Prescriptions   BICTEGRAVIR-EMTRICITABINE-TENOFOVIR AF (BIKTARVY) 50-200-25 MG TABS TABLET    Take 1 tablet by mouth daily.  Previous Medications   ACYCLOVIR OINTMENT (ZOVIRAX) 5 %    Apply topically every 3 (three) hours. Use as needed for symptoms relief for no more than 7 days.   ALBUTEROL (VENTOLIN HFA) 108 (90 BASE) MCG/ACT INHALER    Inhale 1-2 puffs into the lungs every 4 (four) hours as needed for wheezing or shortness of breath.   AMLODIPINE (NORVASC) 5 MG TABLET    TAKE 1 TABLET BY MOUTH AT NIGHT   AMLODIPINE (NORVASC) 5 MG TABLET    Take 5  mg by mouth at bedtime.   CYCLOBENZAPRINE (FLEXERIL) 5 MG TABLET    Take 1 tablet (5 mg total) by mouth 3 (three) times daily as needed for muscle spasms.   EPINEPHRINE (EPIPEN 2-PAK) 0.3 MG/0.3 ML IJ SOAJ INJECTION    Inject 0.3 mg into the muscle as needed for anaphylaxis.   FLUTICASONE (FLONASE) 50 MCG/ACT NASAL SPRAY    Place 2 sprays into both nostrils daily.   IRON-FA-B CMP-C-BIOT-PROBIOTIC (FUSION PLUS) CAPS    Take 1 capsule by mouth daily.   LIDOCAINE (LIDODERM) 5 %    Place 1 patch onto the skin daily. Remove & Discard patch within 12 hours or as directed by MD   SULFAMETHOXAZOLE-TRIMETHOPRIM (BACTRIM DS) 800-160 MG TABLET    Take 1 tablet by mouth daily.   TRAMADOL (ULTRAM) 50 MG TABLET    Take 50 mg by mouth every 4 (four) hours as needed.   VALACYCLOVIR (VALTREX) 500 MG TABLET    TAKE 1 TABLET (500 MG TOTAL) BY MOUTH DAILY. INCREASE TO TWICE DAILY X 3 DAYS WITH ANY SYMPTOMS  Modified Medications   No medications on file  Discontinued Medications   No medications on file    Subjective: Stephanie Espinoza is in for her routine HIV follow-up visit.  She tells me that tomorrow will be her last day of work before she goes out on disability.  She says that she is eager to restart antiretroviral therapy and focus on her  health rather than her work and what other people need.  Review of Systems: Review of Systems  Constitutional:  Negative for fever and weight loss.  Neurological:  Positive for sensory change.    Past Medical History:  Diagnosis Date   Abnormal Pap smear of cervix before 1995   always a repeat back to normal except for 1 time and had colpo bioosy -resutls were normal   Allergic rhinitis    Anxiety    Bell's palsy after 1995 ?   no residual SE.   Depression    Genital herpes    History of drug abuse ([place])    drug usser [place].  cocaine   HIV (human immunodeficiency virus infection) ([place]) 1990   from sexual contact who is unknown   Syphilis    treated    Social  History   Tobacco Use   Smoking status: Former    Types: Cigarettes    Quit date: 03/01/1990    Years since quitting: 32.6   Smokeless tobacco: Never  Vaping Use   Vaping Use: Never used  Substance Use Topics   Alcohol use: No   Drug use: No    Family History  Problem Relation Age of Onset   Lung cancer Mother    Diabetes Father    Breast cancer Other     Allergies  Allergen Reactions   Gabapentin     Swelling,  (Pt states was pre-gabapentin)   Haldol [Haloperidol] Other (See Comments)    Wipes out her memory   Nevirapine     REACTION: Rash 10/08   Thorazine [Chlorpromazine] Other (See Comments)    "makes me comatose"    Health Maintenance  Topic Date Due   COVID-19 Vaccine (5 - 2023-24 season) 10/13/2022   MAMMOGRAM  08/03/2023   PAP SMEAR-Modifier  08/02/2024   Fecal DNA (Cologuard)  08/14/2024   DTaP/Tdap/Td (2 - Td or Tdap) 10/10/2026   INFLUENZA VACCINE  Completed   Hepatitis C Screening  Completed   HIV Screening  Completed   Zoster Vaccines- Shingrix  Completed   HPV VACCINES  Aged Out    Objective:  Vitals:   10/17/22 1048  BP: 115/77  Pulse: 78  Temp: 98.3 F (36.8 C)  TempSrc: Oral  SpO2: 98%  Weight: 147 lb (66.7 kg)  Height: '5\' 2"'$  (1.575 m)   Body mass index is 26.89 kg/m.  Physical Exam Constitutional:      Comments: She is in good spirits.  Neurological:     Comments: She walks slowly with the aid of a cane because of her peripheral neuropathy.  Psychiatric:        Mood and Affect: Mood normal.     Lab Results Lab Results  Component Value Date   WBC 3.1 (L) 10/04/2022   HGB 11.1 (L) 10/04/2022   HCT 35.3 10/04/2022   MCV 86.5 10/04/2022   PLT 187 10/04/2022    Lab Results  Component Value Date   CREATININE 1.19 (H) 10/04/2022   BUN 14 10/04/2022   NA 139 10/04/2022   K 4.3 10/04/2022   CL 106 10/04/2022   CO2 27 10/04/2022    Lab Results  Component Value Date   ALT 11 10/04/2022   AST 19 10/04/2022   ALKPHOS  95 05/11/2022   BILITOT 0.7 10/04/2022    Lab Results  Component Value Date   CHOL 183 10/04/2022   HDL 41 (L) 10/04/2022   LDLCALC 117 (H) 10/04/2022  LDLDIRECT 102.1 03/02/2009   TRIG 136 10/04/2022   CHOLHDL 4.5 10/04/2022   Lab Results  Component Value Date   LABRPR NON-REACTIVE 10/04/2022   HIV 1 RNA Quant (Copies/mL)  Date Value  10/04/2022 58,100 (H)  05/01/2022 18,600 (H)  02/04/2022 8,350 (H)   CD4 T Cell Abs (/uL)  Date Value  05/01/2022 168 (L)  10/10/2021 137 (L)  05/08/2021 273 (L)     Problem List Items Addressed This Visit       High   Human immunodeficiency virus (HIV) disease ([place]) - Primary    Stephanie Espinoza has struggled with adherence to multiple different antiretroviral regimens over the past few decades.  She has never been on therapy consistently and is not on therapy now.  As expected, she has had viral activation and slow decline of her CD4 count which is now below 100.  She will continue trimethoprim/sulfamethoxazole prophylaxis.  Since she is going out of work on disability she is now eager to restart antiretroviral therapy.  I reviewed possible options and she would like to [employer].  I asked her to call us right away if she believes she is developing any side effects so we can help manage them.  She will follow-up in 6 weeks for repeat lab work.  She has already received her annual influenza vaccine and an updated COVID-vaccine.  2008: Nevirapine and Truvada 2009-2010: Kaletra and Truvada 2011-2013: Truvada + Reyataz + Norvir 2014-2016: Complera 2017: Odefsey 2018Norberto Sorenson: Biktarvy 2022: Dovato      Relevant Medications   bictegravir-emtricitabine-tenofovir AF (BIKTARVY) 50-200-25 MG TABS tablet      Michel Bickers, MD [facility] for [facility] [place] [phone] pager   [phone] cell 10/17/2022, 11:17 AM

## 2022-10-17 NOTE — Addendum Note (Signed)
Addended by: Lin Landsman E on: 10/17/2022 11:35 AM   Modules accepted: Orders

## 2022-10-17 NOTE — Telephone Encounter (Signed)
Patient seen in office today. Brought disability paperwork to appointment and discussed with Dr. Megan Salon. Blank forms given to the provider, and blank copies returned to patient, per request. Patient aware office will call when paperwork is completed and ready for pick up.  Binnie Kand, RN

## 2022-10-17 NOTE — Assessment & Plan Note (Signed)
Stephanie Espinoza has struggled with adherence to multiple different antiretroviral regimens over the past few decades.  She has never been on therapy consistently and is not on therapy now.  As expected, she has had viral activation and slow decline of her CD4 count which is now below 100.  She will continue trimethoprim/sulfamethoxazole prophylaxis.  Since she is going out of work on disability she is now eager to restart antiretroviral therapy.  I reviewed possible options and she would like to [employer].  I asked her to call us right away if she believes she is developing any side effects so we can help manage them.  She will follow-up in 6 weeks for repeat lab work.  She has already received her annual influenza vaccine and an updated COVID-vaccine.  2008: Nevirapine and Truvada 2009-2010: Kaletra and Truvada 2011-2013: Truvada + Reyataz + Norvir 2014-2016: Complera 2017: Odefsey 2018Norberto Sorenson: Biktarvy 2022: Dovato

## 2022-10-18 ENCOUNTER — Encounter: Payer: Self-pay | Admitting: Internal Medicine

## 2022-10-18 DIAGNOSIS — B20 Human immunodeficiency virus [HIV] disease: Secondary | ICD-10-CM

## 2022-10-18 MED ORDER — BICTEGRAVIR-EMTRICITAB-TENOFOV 50-200-25 MG PO TABS: 1.0000 | ORAL_TABLET | Freq: Every day | ORAL | 11 refills | Status: DC

## 2022-10-19 ENCOUNTER — Encounter: Payer: Self-pay | Admitting: Internal Medicine

## 2022-10-21 ENCOUNTER — Other Ambulatory Visit: Payer: Self-pay

## 2022-10-21 ENCOUNTER — Telehealth: Payer: Self-pay

## 2022-10-21 DIAGNOSIS — B20 Human immunodeficiency virus [HIV] disease: Secondary | ICD-10-CM

## 2022-10-21 MED ORDER — BICTEGRAVIR-EMTRICITAB-TENOFOV 50-200-25 MG PO TABS: 1.0000 | ORAL_TABLET | Freq: Every day | ORAL | 3 refills | Status: DC

## 2022-10-21 NOTE — Progress Notes (Signed)
Canceled Biktarvy at [employer] and sent to [facility] per patient request.   Beryle Flock, RN

## 2022-10-21 NOTE — Telephone Encounter (Signed)
Patient reached out office to see how she can have monthly forms sent to our office for review. Advised she either drop forms off or email them for provider to review/ complete.  Will forward message to provider to advise if appointment is needed. Leatrice Jewels, RMA

## 2022-10-22 ENCOUNTER — Other Ambulatory Visit: Payer: Self-pay | Admitting: Pharmacist

## 2022-10-22 ENCOUNTER — Encounter: Payer: Self-pay | Admitting: Internal Medicine

## 2022-10-22 DIAGNOSIS — B20 Human immunodeficiency virus [HIV] disease: Secondary | ICD-10-CM

## 2022-10-22 DIAGNOSIS — G629 Polyneuropathy, unspecified: Secondary | ICD-10-CM | POA: Insufficient documentation

## 2022-10-22 MED ORDER — BICTEGRAVIR-EMTRICITAB-TENOFOV 50-200-25 MG PO TABS: 1.0000 | ORAL_TABLET | Freq: Every day | ORAL | 0 refills | Status: DC

## 2022-10-22 NOTE — Progress Notes (Signed)
Medication Samples have been provided to the patient.  Drug name: Biktarvy        Strength: 50/200/25 mg       Qty: 7 tablets (1 bottles) LOT: CPBDLA   Exp.Date: 3/26  Dosing instructions: Take one tablet by mouth once daily  The patient has been instructed regarding the correct time, dose, and frequency of taking this medication, including desired effects and most common side effects.   Alfonse Spruce, PharmD, CPP, BCIDP, [place] Clinical Pharmacist Practitioner [facility] [place] [facility]

## 2022-10-23 ENCOUNTER — Other Ambulatory Visit: Payer: Self-pay

## 2022-10-23 ENCOUNTER — Ambulatory Visit (INDEPENDENT_AMBULATORY_CARE_PROVIDER_SITE_OTHER): Payer: BC Managed Care – PPO | Admitting: Internal Medicine

## 2022-10-23 DIAGNOSIS — B20 Human immunodeficiency virus [HIV] disease: Secondary | ICD-10-CM

## 2022-10-23 NOTE — Progress Notes (Signed)
Virtual Visit via Video Note  I connected with Stephanie Espinoza on 10/23/22 at  9:15 AM EST by a video enabled telemedicine application and verified that I am speaking with the correct person using two identifiers.  Location: Patient: Home Provider: [facility]   I discussed the limitations of evaluation and management by telemedicine and the availability of in person appointments. The patient expressed understanding and agreed to proceed.  History of Present Illness: I called and spoke with Stephanie Espinoza today.  She says she is taking her Biktarvy with food and so far she is tolerating it better than when she previously took it.  She has not missed any doses.  I reviewed her disability papers with her.  She has become increasingly immobile due to her painful peripheral neuropathy.  She has been intolerant of several medications for her neuropathy and has had only transient improvement with epidural steroid injections.  She has been referred for pain management.  She has been intolerant of numerous HIV antiretroviral regimens over the past 3 decades.  She has never been on therapy consistently and has had gradual decline of her CD4 count which is now below 100 confirming a diagnosis of AIDS.  This is certainly a threat to her health and I believe that is contributing to her problems with memory and concentration.  She suffers from chronic depression and has become increasingly withdrawn and having difficulty working.   Observations/Objective:   Assessment and Plan: I believe that she is currently unable to work given her chronic progressive medical conditions.  Follow Up Instructions: Continue Biktarvy Follow-up as previously arranged   I discussed the assessment and treatment plan with the patient. The patient was provided an opportunity to ask questions and all were answered. The patient agreed with the plan and demonstrated an understanding of the instructions.   The patient was advised to call back or  seek an in-person evaluation if the symptoms worsen or if the condition fails to improve as anticipated.  I provided 16 minutes of non-face-to-face time during this encounter.   Michel Bickers, MD

## 2022-10-24 ENCOUNTER — Other Ambulatory Visit (HOSPITAL_COMMUNITY): Payer: Self-pay

## 2022-10-30 ENCOUNTER — Encounter: Payer: Self-pay | Admitting: Internal Medicine

## 2022-11-05 ENCOUNTER — Other Ambulatory Visit: Payer: Self-pay | Admitting: Internal Medicine

## 2022-11-07 ENCOUNTER — Other Ambulatory Visit (HOSPITAL_COMMUNITY): Payer: Self-pay

## 2022-11-21 ENCOUNTER — Encounter: Payer: Self-pay | Admitting: Internal Medicine

## 2022-11-21 ENCOUNTER — Other Ambulatory Visit: Payer: Self-pay

## 2022-11-21 ENCOUNTER — Ambulatory Visit: Payer: BC Managed Care – PPO | Admitting: Internal Medicine

## 2022-11-21 VITALS — BP 117/79 | HR 84 | Temp 97.7°F | Ht 62.0 in | Wt 141.0 lb

## 2022-11-21 DIAGNOSIS — B20 Human immunodeficiency virus [HIV] disease: Secondary | ICD-10-CM

## 2022-11-21 NOTE — Progress Notes (Signed)
Patient Active Problem List   Diagnosis Date Noted   Human immunodeficiency virus (HIV) disease ([place]) 09/06/2006    Priority: High   GENITAL HERPES 09/06/2006    Priority: Medium    Peripheral neuropathy 10/22/2022   Right foot pain 05/22/2022   Right lumbar radiculopathy 05/08/2022   Abnormal CXR 02/15/2022   DDD (degenerative disc disease), lumbar 02/12/2022   Anterolisthesis of lumbosacral spine 02/12/2022   Allergic reaction to bee sting 07/27/2021   Numbness and tingling in left arm 05/31/2021   Weakness of both legs 03/07/2021   B12 deficiency 03/07/2021   COVID 11/09/2020   Urinary incontinence 05/16/2020   Other fatigue 05/16/2020   History of syphilis 05/14/2020   Memory loss 02/14/2020   Insomnia 02/14/2020   Herpes zoster 12/31/2019   Normocytic anemia 03/01/2019   Hyperglycemia 03/01/2019   Obesity (BMI 30.0-34.9) 03/01/2019   Headache 06/30/2018   Urinary frequency 05/01/2018   Sciatica 05/26/2017   Numbness and tingling of both legs 05/10/2017   Cough 11/05/2016   Cervical 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 (HPV) DNA test positive 10/18/2016   Chronic bilateral low back pain with bilateral sciatica 06/12/2016   Essential hypertension 09/16/2014   Vasomotor rhinitis 05/05/2014   Benign paroxysmal positional vertigo 09/21/2012   Sinusitis 03/22/2009   Depression 09/06/2006   PAP SMEAR, ABNORMAL 09/06/2006    Patient's Medications  New Prescriptions   No medications on file  Previous Medications   ACYCLOVIR OINTMENT (ZOVIRAX) 5 %    Apply topically every 3 (three) hours. Use as needed for symptoms relief for no more than 7 days.   ALBUTEROL (VENTOLIN HFA) 108 (90 BASE) MCG/ACT INHALER    Inhale 1-2 puffs into the lungs every 4 (four) hours as needed for wheezing or shortness of breath.   AMLODIPINE (NORVASC) 5 MG TABLET    TAKE 1 TABLET BY MOUTH AT NIGHT   BICTEGRAVIR-EMTRICITABINE-TENOFOVIR AF (BIKTARVY) 50-200-25 MG TABS TABLET    Take 1 tablet  by mouth daily.   CYCLOBENZAPRINE (FLEXERIL) 5 MG TABLET    TAKE 1 TABLET BY MOUTH THREE TIMES A DAY AS NEEDED FOR MUSCLE SPASMS   EPINEPHRINE (EPIPEN 2-PAK) 0.3 MG/0.3 ML IJ SOAJ INJECTION    Inject 0.3 mg into the muscle as needed for anaphylaxis.   FLUTICASONE (FLONASE) 50 MCG/ACT NASAL SPRAY    Place 2 sprays into both nostrils daily.   IRON-FA-B CMP-C-BIOT-PROBIOTIC (FUSION PLUS) CAPS    Take 1 capsule by mouth daily.   LIDOCAINE (LIDODERM) 5 %    Place 1 patch onto the skin daily. Remove & Discard patch within 12 hours or as directed by MD   SULFAMETHOXAZOLE-TRIMETHOPRIM (BACTRIM DS) 800-160 MG TABLET    Take 1 tablet by mouth daily.   TRAMADOL (ULTRAM) 50 MG TABLET    Take 50 mg by mouth every 4 (four) hours as needed.   VALACYCLOVIR (VALTREX) 500 MG TABLET    TAKE 1 TABLET (500 MG TOTAL) BY MOUTH DAILY. INCREASE TO TWICE DAILY X 3 DAYS WITH ANY SYMPTOMS  Modified Medications   No medications on file  Discontinued Medications   No medications on file    Subjective: Stephanie Espinoza is in for her routine HIV follow-up visit.  Stephanie Espinoza started back on Biktarvy last month.  Stephanie Espinoza has been taking it around 9:30 PM just before her bedtime.  Stephanie Espinoza denies missing any doses.  Stephanie Espinoza says that Stephanie Espinoza is feeling more tired and tends to sleep for extended periods  each night and during the day.  Stephanie Espinoza continues to be bothered by painful peripheral neuropathy affecting her feet and lower legs.  Review of Systems: Review of Systems  Constitutional:  Positive for malaise/fatigue. Negative for fever and weight loss.  Musculoskeletal:  Positive for joint pain.  Psychiatric/Behavioral:  Positive for depression. The patient is not nervous/anxious.     Past Medical History:  Diagnosis Date   Abnormal Pap smear of cervix before 1995   always a repeat back to normal except for 1 time and had colpo bioosy -resutls were normal   Allergic rhinitis    Anxiety    Bell's palsy after 1995 ?   no residual SE.   Depression    Genital  herpes    History of drug abuse ([place])    drug usser [place].  cocaine   HIV (human immunodeficiency virus infection) ([place]) 1990   from sexual contact who is unknown   Syphilis    treated    Social History   Tobacco Use   Smoking status: Former    Types: Cigarettes    Quit date: 03/01/1990    Years since quitting: 32.7   Smokeless tobacco: Never  Vaping Use   Vaping Use: Never used  Substance Use Topics   Alcohol use: No   Drug use: No    Family History  Problem Relation Age of Onset   Lung cancer Mother    Diabetes Father    Breast cancer Other     Allergies  Allergen Reactions   Gabapentin     Swelling,  (Pt states was pre-gabapentin)   Haldol [Haloperidol] Other (See Comments)    Wipes out her memory   Nevirapine     REACTION: Rash 10/08   Thorazine [Chlorpromazine] Other (See Comments)    "makes me comatose"    Health Maintenance  Topic Date Due   COVID-19 Vaccine (5 - 2023-24 season) 10/13/2022   MAMMOGRAM  08/03/2023   PAP SMEAR-Modifier  08/02/2024   Fecal DNA (Cologuard)  08/14/2024   DTaP/Tdap/Td (2 - Td or Tdap) 10/10/2026   INFLUENZA VACCINE  Completed   Hepatitis C Screening  Completed   HIV Screening  Completed   Zoster Vaccines- Shingrix  Completed   HPV VACCINES  Aged Out    Objective:  Vitals:   11/21/22 0847  BP: 117/79  Pulse: 84  Temp: 97.7 F (36.5 C)  TempSrc: Oral  SpO2: 100%  Weight: 141 lb (64 kg)  Height: '5\' 2"'$  (1.575 m)   Body mass index is 25.79 kg/m.  Physical Exam Constitutional:      Comments: Stephanie Espinoza has a flat affect, more so than usual.  Cardiovascular:     Rate and Rhythm: Normal rate.  Pulmonary:     Effort: Pulmonary effort is normal.     Lab Results Lab Results  Component Value Date   WBC 3.1 (L) 10/04/2022   HGB 11.1 (L) 10/04/2022   HCT 35.3 10/04/2022   MCV 86.5 10/04/2022   PLT 187 10/04/2022    Lab Results  Component Value Date   CREATININE 1.19 (H) 10/04/2022   BUN 14 10/04/2022    NA 139 10/04/2022   K 4.3 10/04/2022   CL 106 10/04/2022   CO2 27 10/04/2022    Lab Results  Component Value Date   ALT 11 10/04/2022   AST 19 10/04/2022   ALKPHOS 95 05/11/2022   BILITOT 0.7 10/04/2022    Lab Results  Component Value Date  CHOL 183 10/04/2022   HDL 41 (L) 10/04/2022   LDLCALC 117 (H) 10/04/2022   LDLDIRECT 102.1 03/02/2009   TRIG 136 10/04/2022   CHOLHDL 4.5 10/04/2022   Lab Results  Component Value Date   LABRPR NON-REACTIVE 10/04/2022   HIV 1 RNA Quant (Copies/mL)  Date Value  10/04/2022 58,100 (H)  05/01/2022 18,600 (H)  02/04/2022 8,350 (H)   CD4 T Cell Abs (/uL)  Date Value  05/01/2022 168 (L)  10/10/2021 137 (L)  05/08/2021 273 (L)     Problem List Items Addressed This Visit       High   Human immunodeficiency virus (HIV) disease ([place]) - Primary    Seems as though her adherence has been good since restarting Biktarvy recently.  Stephanie Espinoza will get repeat blood work today, continue Airline pilot and follow-up in 2 months.  I do not think that her increased fatigue and sleepiness is related to [place].  I suspect that is due to her chronic ongoing depression and possibly side effects of medication Stephanie Espinoza takes for neuropathy.      Relevant Orders   T-helper cells (CD4) count (not at [facility])   HIV-1 RNA quant-no reflex-bld      Michel Bickers, MD [facility] for [place] [phone] pager   [phone] cell 11/21/2022, 9:06 AM

## 2022-11-21 NOTE — Assessment & Plan Note (Signed)
Seems as though her adherence has been good since restarting Biktarvy recently.  She will get repeat blood work today, continue Airline pilot and follow-up in 2 months.  I do not think that her increased fatigue and sleepiness is related to [place].  I suspect that is due to her chronic ongoing depression and possibly side effects of medication she takes for neuropathy.

## 2022-11-22 LAB — T-HELPER CELLS (CD4) COUNT (NOT AT ARMC)
CD4 % Helper T Cell: 9 % — ABNORMAL LOW (ref 33–65)
CD4 T Cell Abs: 126 /uL — ABNORMAL LOW (ref 400–1790)

## 2022-11-25 ENCOUNTER — Ambulatory Visit (INDEPENDENT_AMBULATORY_CARE_PROVIDER_SITE_OTHER): Payer: BC Managed Care – PPO | Admitting: Internal Medicine

## 2022-11-25 ENCOUNTER — Encounter: Payer: Self-pay | Admitting: Internal Medicine

## 2022-11-25 VITALS — BP 120/80 | HR 92 | Temp 98.1°F | Ht 62.0 in | Wt 142.0 lb

## 2022-11-25 DIAGNOSIS — G63 Polyneuropathy in diseases classified elsewhere: Secondary | ICD-10-CM | POA: Diagnosis not present

## 2022-11-25 DIAGNOSIS — Z0001 Encounter for general adult medical examination with abnormal findings: Secondary | ICD-10-CM

## 2022-11-25 DIAGNOSIS — I10 Essential (primary) hypertension: Secondary | ICD-10-CM

## 2022-11-25 DIAGNOSIS — B20 Human immunodeficiency virus [HIV] disease: Secondary | ICD-10-CM

## 2022-11-25 MED ORDER — DOXYCYCLINE HYCLATE 100 MG PO TABS: 100.0000 mg | ORAL_TABLET | Freq: Two times a day (BID) | ORAL | 0 refills | Status: DC

## 2022-11-25 NOTE — Assessment & Plan Note (Signed)
Flu shot up to date. Covid-19 up to date. Pneumonia up to date. Shingrix complete. Tetanus up to date. Colonoscopy up to date. Mammogram up to date, pap smear up to date. Counseled about sun safety and mole surveillance. Counseled about the dangers of distracted driving. Given 10 year screening recommendations.

## 2022-11-25 NOTE — Progress Notes (Signed)
   Subjective:   Patient ID: Stephanie Espinoza, female    DOB: [birth year], 67 y.o.   MRN: [id number]  HPI The patient is here for physical.  PMH, [facility], social history reviewed and updated  Review of Systems  Constitutional:  Positive for activity change, appetite change and fatigue.  HENT: Negative.    Eyes: Negative.   Respiratory:  Negative for cough, chest tightness and shortness of breath.   Cardiovascular:  Negative for chest pain, palpitations and leg swelling.  Gastrointestinal:  Negative for abdominal distention, abdominal pain, constipation, diarrhea and vomiting.  Musculoskeletal: Negative.   Skin: Negative.   Neurological:  Positive for numbness.  Psychiatric/Behavioral:  Positive for decreased concentration, dysphoric mood and sleep disturbance.     Objective:  Physical Exam Constitutional:      Appearance: She is well-developed.  HENT:     Head: Normocephalic and atraumatic.  Cardiovascular:     Rate and Rhythm: Normal rate and regular rhythm.  Pulmonary:     Effort: Pulmonary effort is normal. No respiratory distress.     Breath sounds: Normal breath sounds. No wheezing or rales.  Abdominal:     General: Bowel sounds are normal. There is no distension.     Palpations: Abdomen is soft.     Tenderness: There is no abdominal tenderness. There is no rebound.  Musculoskeletal:     Cervical back: Normal range of motion.  Skin:    General: Skin is warm and dry.  Neurological:     Mental Status: She is alert and oriented to person, place, and time.     Sensory: Sensory deficit present.     Coordination: Coordination normal.     Vitals:   11/25/22 0847  BP: 120/80  Pulse: 92  Temp: 98.1 F (36.7 C)  TempSrc: Oral  SpO2: 95%  Weight: 142 lb (64.4 kg)  Height: '5\' 2"'$  (1.575 m)    Assessment & Plan:

## 2022-11-25 NOTE — Assessment & Plan Note (Signed)
BP at goal on amlodipine 5 mg daily. Continue.

## 2022-11-25 NOTE — Patient Instructions (Signed)
We have sent in the doxycycline to take if needed.

## 2022-11-25 NOTE — Assessment & Plan Note (Addendum)
CD4 count most recent 120s and she is taking HAART for about 1 month and having brain fog, nausea, fatigue which she has gotten in past. She is encouraged to stay for 3-6 months and see if this passes. 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

## 2022-11-25 NOTE — Assessment & Plan Note (Signed)
As we discussed likely due to HIV neuropathy. We have previously screened her for other etiology. She was unable to tolerate gabapentin or lyrica. Using tramadol in the evening and lidocaine patches during the day with some success. This is limiting her QOL currently.

## 2022-11-26 ENCOUNTER — Encounter: Payer: Self-pay | Admitting: Internal Medicine

## 2022-11-26 LAB — HIV-1 RNA QUANT-NO REFLEX-BLD
HIV 1 RNA Quant: <20 Copies/mL — ABNORMAL HIGH
HIV-1 RNA Quant, Log: <1.3 Log cps/mL — ABNORMAL HIGH

## 2022-11-27 ENCOUNTER — Ambulatory Visit (HOSPITAL_BASED_OUTPATIENT_CLINIC_OR_DEPARTMENT_OTHER): Payer: BC Managed Care – PPO | Admitting: Obstetrics & Gynecology

## 2022-11-28 ENCOUNTER — Encounter: Payer: Self-pay | Admitting: Internal Medicine

## 2022-11-28 ENCOUNTER — Encounter: Payer: Self-pay | Admitting: Podiatry

## 2022-11-29 NOTE — Telephone Encounter (Signed)
Please schedule an appointment with this patient for routine footcare with Dr. Prudence Davidson or Adah Perl.  Thanks, Dr. Amalia Hailey

## 2022-12-24 ENCOUNTER — Other Ambulatory Visit: Payer: Self-pay | Admitting: Internal Medicine

## 2022-12-24 DIAGNOSIS — B20 Human immunodeficiency virus [HIV] disease: Secondary | ICD-10-CM

## 2022-12-24 NOTE — Telephone Encounter (Signed)
CD4 126

## 2023-01-01 ENCOUNTER — Encounter: Payer: Self-pay | Admitting: Internal Medicine

## 2023-01-01 NOTE — Telephone Encounter (Signed)
Form left at front desk for patient to pick up.  Beryle Flock, RN

## 2023-01-07 ENCOUNTER — Telehealth: Payer: Self-pay | Admitting: Internal Medicine

## 2023-01-07 NOTE — Telephone Encounter (Signed)
I see that her previous doctor has listed that her cause for disability is depression but she has not really been treated for depression recently and last time that provider documented about her depression was 2022. I would feel uncomfortable listing this as a primary cause of disability for her. What condition does she feel makes her disabled and unable to work? I am also unsure if we change the listed cause of disability this may affect her overall disability claim. She may need to check with her work about this.

## 2023-01-07 NOTE — Telephone Encounter (Signed)
Patient came by and dropped off Form 703, papers will be in the providers box up front.

## 2023-01-08 ENCOUNTER — Encounter (HOSPITAL_BASED_OUTPATIENT_CLINIC_OR_DEPARTMENT_OTHER): Payer: Self-pay | Admitting: Obstetrics & Gynecology

## 2023-01-08 ENCOUNTER — Other Ambulatory Visit (HOSPITAL_COMMUNITY)
Admission: RE | Admit: 2023-01-08 | Discharge: 2023-01-08 | Disposition: A | Discharge status: Home / Self Care | Payer: BC Managed Care – PPO | Source: Ambulatory Visit | Attending: Obstetrics & Gynecology | Admitting: Obstetrics & Gynecology

## 2023-01-08 ENCOUNTER — Ambulatory Visit (INDEPENDENT_AMBULATORY_CARE_PROVIDER_SITE_OTHER): Payer: BC Managed Care – PPO | Admitting: Obstetrics & Gynecology

## 2023-01-08 VITALS — BP 130/80 | HR 79 | Ht 62.0 in | Wt 145.0 lb

## 2023-01-08 DIAGNOSIS — B20 Human immunodeficiency virus [HIV] disease: Secondary | ICD-10-CM

## 2023-01-08 DIAGNOSIS — Z01419 Encounter for gynecological examination (general) (routine) without abnormal findings: Secondary | ICD-10-CM

## 2023-01-08 DIAGNOSIS — A609 Anogenital herpesviral infection, unspecified: Secondary | ICD-10-CM | POA: Diagnosis not present

## 2023-01-08 DIAGNOSIS — Z1231 Encounter for screening mammogram for malignant neoplasm of breast: Secondary | ICD-10-CM

## 2023-01-08 DIAGNOSIS — B977 Papillomavirus as the cause of diseases classified elsewhere: Secondary | ICD-10-CM | POA: Insufficient documentation

## 2023-01-08 DIAGNOSIS — N72 Inflammatory disease of cervix uteri: Secondary | ICD-10-CM | POA: Diagnosis present

## 2023-01-08 DIAGNOSIS — Z8619 Personal history of other infectious and parasitic diseases: Secondary | ICD-10-CM

## 2023-01-08 MED ORDER — VALACYCLOVIR HCL 500 MG PO TABS: 500.0000 mg | ORAL_TABLET | Freq: Two times a day (BID) | ORAL | 2 refills | Status: AC

## 2023-01-08 MED ORDER — ACYCLOVIR 5 % EX OINT: TOPICAL_OINTMENT | CUTANEOUS | 3 refills | Status: DC

## 2023-01-08 NOTE — Progress Notes (Signed)
67 y.o. G21P4004 Divorced female here for AEX.  Has developed neuropathy since I last saw her and is using a cane for walking.  Stopped working end of December.  Feels like she still adjusting.  Denies vaginal bleeding.    Patient's last menstrual period was 11/04/1997.          Sexually active: No.  The current method of family planning is post menopausal status.    Exercising: as much as she can Smoker:  no  Health Maintenance: Pap:  08/02/2021 Negative History of abnormal Pap:  h/o HR HPV MMG:  08/02/2021 Negative Colonoscopy:  cologuard 08/14/2021 BMD:   08/18/2019 Screening Labs: does with Dr. Sharlet Salina and Dr. Megan Salon   reports that she quit smoking about 32 years ago. Her smoking use included cigarettes. She has never used smokeless tobacco. She reports that she does not drink alcohol and does not use drugs.  Past Medical History:  Diagnosis Date   Abnormal Pap smear of cervix before 1995   always a repeat back to normal except for 1 time and had colpo bioosy -resutls were normal   Allergic rhinitis    Anxiety    Bell's palsy after 1995 ?   no residual SE.   Depression    Genital herpes    History of drug abuse ([place])    drug usser [place].  cocaine   HIV (human immunodeficiency virus infection) ([place]) 1990   from sexual contact who is unknown   Syphilis    treated    Past Surgical History:  Procedure Laterality Date   CESAREAN SECTION  1997   ESOPHAGOGASTRODUODENOSCOPY (EGD) WITH PROPOFOL N/A 03/02/2019   Procedure: ESOPHAGOGASTRODUODENOSCOPY (EGD) WITH PROPOFOL;  Surgeon: Arta Silence, MD;  Location: [facility];  Service: Endoscopy;  Laterality: N/A;   EUS N/A 03/02/2019   Procedure: FULL UPPER ENDOSCOPIC ULTRASOUND (EUS) RADIAL;  Surgeon: Arta Silence, MD;  Location: [facility];  Service: Endoscopy;  Laterality: N/A;   LAPAROSCOPIC CHOLECYSTECTOMY SINGLE PORT N/A 03/01/2019   Procedure: LAPAROSCOPIC CHOLECYSTECTOMY SINGLE SITE/ INTRAOPERATIVE  CHOLANGIOGRAM;  Surgeon: Michael Boston, MD;  Location: [facility] ORS;  Service: General;  Laterality: N/A;    Current Outpatient Medications  Medication Sig Dispense Refill   acyclovir ointment (ZOVIRAX) 5 % Apply topically every 3 (three) hours. Use as needed for symptoms relief for no more than 7 days. 15 g 3   albuterol (VENTOLIN HFA) 108 (90 Base) MCG/ACT inhaler Inhale 1-2 puffs into the lungs every 4 (four) hours as needed for wheezing or shortness of breath. 6.7 each 2   amLODipine (NORVASC) 5 MG tablet TAKE 1 TABLET BY MOUTH AT NIGHT 90 tablet 2   bictegravir-emtricitabine-tenofovir AF (BIKTARVY) 50-200-25 MG TABS tablet Take 1 tablet by mouth daily. 30 tablet 3   cyclobenzaprine (FLEXERIL) 5 MG tablet TAKE 1 TABLET BY MOUTH THREE TIMES A DAY AS NEEDED FOR MUSCLE SPASMS 30 tablet 1   doxycycline (VIBRA-TABS) 100 MG tablet Take 1 tablet (100 mg total) by mouth 2 (two) times daily. 20 tablet 0   EPINEPHrine (EPIPEN 2-PAK) 0.3 mg/0.3 mL IJ SOAJ injection Inject 0.3 mg into the muscle as needed for anaphylaxis. 1 each 2   fluticasone (FLONASE) 50 MCG/ACT nasal spray Place 2 sprays into both nostrils daily. (Patient taking differently: Place 2 sprays into both nostrils daily as needed.) 48 mL 2   lidocaine (LIDODERM) 5 % Place 1 patch onto the skin daily. Remove & Discard patch within 12 hours or as directed by MD 30  patch 0   sulfamethoxazole-trimethoprim (BACTRIM DS) 800-160 MG tablet TAKE 1 TABLET BY MOUTH EVERY DAY 30 tablet 0   traMADol (ULTRAM) 50 MG tablet Take 50 mg by mouth every 4 (four) hours as needed.     valACYclovir (VALTREX) 500 MG tablet TAKE 1 TABLET (500 MG TOTAL) BY MOUTH DAILY. INCREASE TO TWICE DAILY X 3 DAYS WITH ANY SYMPTOMS (Patient taking differently: as needed.) 270 tablet 1   No current facility-administered medications for this visit.    Family History  Problem Relation Age of Onset   Lung cancer Mother    Diabetes Father    Breast cancer Other      ROS: Constitutional: negative Genitourinary:negative  Exam:   BP 130/80 (BP Location: Left Arm, Patient Position: Sitting, Cuff Size: Large)   Pulse 79   Ht '5\' 2"'$  (1.575 m) Comment: Reported  Wt 145 lb (65.8 kg)   LMP 11/04/1997   BMI 26.52 kg/m   Height: '5\' 2"'$  (157.5 cm) (Reported)  General appearance: alert, cooperative and appears stated age Head: Normocephalic, without obvious abnormality, atraumatic Neck: no adenopathy, supple, symmetrical, trachea midline and thyroid normal to inspection and palpation Lungs: clear to auscultation bilaterally Breasts: normal appearance, no masses or tenderness Heart: regular rate and rhythm Abdomen: soft, non-tender; bowel sounds normal; no masses,  no organomegaly Extremities: extremities normal, atraumatic, no cyanosis or edema Skin: Skin color, texture, turgor normal. No rashes or lesions Lymph nodes: Cervical, supraclavicular, and axillary nodes normal. No abnormal inguinal nodes palpated Neurologic: Grossly normal   Pelvic: External genitalia:  no lesions              Urethra:  normal appearing urethra with no masses, tenderness or lesions              Bartholins and Skenes: normal                 Vagina: normal appearing vagina with normal color and no discharge, no lesions              Cervix: no lesions              Pap taken: Yes.   Bimanual Exam:  Uterus:  normal size, contour, position, consistency, mobility, non-tender              Adnexa: normal adnexa and no mass, fullness, tenderness               Rectovaginal: Confirms               Anus:  normal sphincter tone, no lesions  Chaperone, Octaviano Batty, CMA, was present for exam.  Assessment/Plan: 1. Well woman exam with routine gynecological exam - Pap smear with HR HPV Obtained today - Mammogram order placed.  Could not be worked into schedule today at [facility] [facility] [facility].  Number givne ot pt. - Colonoscopy has been declined but cologuard negative 2022.   - Bone mineral  density done 08/2019 - lab work done with PCP, Dr. Sharlet Salina - vaccines reviewed/updated  2. Encounter for screening mammogram for malignant neoplasm of breast - MM 3D SCREENING MAMMOGRAM BILATERAL BREAST; Future  3. HSV (herpes simplex virus) anogenital infection - valACYclovir (VALTREX) 500 MG tablet; Take 1 tablet (500 mg total) by mouth 2 (two) times daily for 3 days. 1 tab BID x 3 days with new onset of symptoms.  Dispense: 30 tablet; Refill: 2 - acyclovir ointment (ZOVIRAX) 5 %; Apply topically every 3 (three) hours. Use as needed for symptoms  relief for no more than 7 days.  Dispense: 15 g; Refill: 3  4. High risk human papilloma virus (HPV) infection of cervix  5. Human immunodeficiency virus (HIV) disease ([place])  6. History of syphilis - treated previously

## 2023-01-08 NOTE — Telephone Encounter (Signed)
Spoke with patient and patient is very frustrated and confused as in to why she was told in the first place to bring disability documentation to her Primary doctor when her HIV doctor should be handling this. She stated that her HIV doctor will be retiring at the end of the this month and she has to fine another HIV doctor but she was again told that she should have you fill out this documentation, which again she is un sure why and she does not know why her HIV doctor put depression for her disability and stated that we should call and ask them why they did because she has Peripheral Neuropathy and that was documented as well. This is the reason why she is unable to work.

## 2023-01-08 NOTE — Patient Instructions (Signed)
Call [phone] to schedule an appointment at [facility].     Mammograms can also be self-scheduled online through [facility].

## 2023-01-09 NOTE — Telephone Encounter (Signed)
It is appropriate for primary care to fill out these forms often. Specialists can fill out the form if the disability is solely due to a diagnosis the specialist is treating. I am just unable to list depression as primary disability reason. I am also unsure if changing the primary and secondary diagnosis would affect her current claim.

## 2023-01-14 LAB — CYTOLOGY - PAP
Comment: NEGATIVE
Diagnosis: NEGATIVE
High risk HPV: NEGATIVE

## 2023-01-16 ENCOUNTER — Ambulatory Visit: Payer: BC Managed Care – PPO | Admitting: Internal Medicine

## 2023-01-16 ENCOUNTER — Other Ambulatory Visit: Payer: Self-pay

## 2023-01-16 ENCOUNTER — Encounter: Payer: Self-pay | Admitting: Internal Medicine

## 2023-01-16 ENCOUNTER — Ambulatory Visit (INDEPENDENT_AMBULATORY_CARE_PROVIDER_SITE_OTHER): Payer: BC Managed Care – PPO | Admitting: Internal Medicine

## 2023-01-16 VITALS — BP 133/83 | HR 75 | Temp 98.5°F | Wt 141.0 lb

## 2023-01-16 DIAGNOSIS — B20 Human immunodeficiency virus [HIV] disease: Secondary | ICD-10-CM | POA: Diagnosis not present

## 2023-01-16 NOTE — Progress Notes (Signed)
Patient Active Problem List   Diagnosis Date Noted   Human immunodeficiency virus (HIV) disease ([place]) 09/06/2006    Priority: High   GENITAL HERPES 09/06/2006    Priority: Medium    Encounter for general adult medical examination with abnormal findings 11/25/2022   Peripheral neuropathy 10/22/2022   Right foot pain 05/22/2022   Right lumbar radiculopathy 05/08/2022   Abnormal CXR 02/15/2022   DDD (degenerative disc disease), lumbar 02/12/2022   Anterolisthesis of lumbosacral spine 02/12/2022   Allergic reaction to bee sting 07/27/2021   Weakness of both legs 03/07/2021   B12 deficiency 03/07/2021   Urinary incontinence 05/16/2020   Other fatigue 05/16/2020   History of syphilis 05/14/2020   Memory loss 02/14/2020   Insomnia 02/14/2020   Herpes zoster 12/31/2019   Normocytic anemia 03/01/2019   Hyperglycemia 03/01/2019   Headache 06/30/2018   Urinary frequency 05/01/2018   Cervical 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 (HPV) DNA test positive 10/18/2016   Essential hypertension 09/16/2014   Vasomotor rhinitis 05/05/2014   Benign paroxysmal positional vertigo 09/21/2012   Sinusitis 03/22/2009   Depression 09/06/2006    Patient's Medications  New Prescriptions   No medications on file  Previous Medications   ACYCLOVIR OINTMENT (ZOVIRAX) 5 %    Apply topically every 3 (three) hours. Use as needed for symptoms relief for no more than 7 days.   ALBUTEROL (VENTOLIN HFA) 108 (90 BASE) MCG/ACT INHALER    Inhale 1-2 puffs into the lungs every 4 (four) hours as needed for wheezing or shortness of breath.   AMLODIPINE (NORVASC) 5 MG TABLET    TAKE 1 TABLET BY MOUTH AT NIGHT   BICTEGRAVIR-EMTRICITABINE-TENOFOVIR AF (BIKTARVY) 50-200-25 MG TABS TABLET    Take 1 tablet by mouth daily.   CYCLOBENZAPRINE (FLEXERIL) 5 MG TABLET    TAKE 1 TABLET BY MOUTH THREE TIMES A DAY AS NEEDED FOR MUSCLE SPASMS   EPINEPHRINE (EPIPEN 2-PAK) 0.3 MG/0.3 ML IJ SOAJ INJECTION    Inject 0.3 mg into  the muscle as needed for anaphylaxis.   FLUTICASONE (FLONASE) 50 MCG/ACT NASAL SPRAY    Place 2 sprays into both nostrils daily.   LIDOCAINE (LIDODERM) 5 %    Place 1 patch onto the skin daily. Remove & Discard patch within 12 hours or as directed by MD   SULFAMETHOXAZOLE-TRIMETHOPRIM (BACTRIM DS) 800-160 MG TABLET    TAKE 1 TABLET BY MOUTH EVERY DAY   TRAMADOL (ULTRAM) 50 MG TABLET    Take 50 mg by mouth every 4 (four) hours as needed.  Modified Medications   No medications on file  Discontinued Medications   No medications on file    Subjective: Stephanie Espinoza is in for her routine HIV follow-up visit.  She has been back on Biktarvy for about 3 months.  She takes it each evening before going to bed.  She denies missing any doses.  Over the last several decades she has had a great deal of difficulty tolerating every antiretroviral regimen she has taken.  She has rarely stayed on a regimen for more than a few months because of perceived side effects.  She says that she believes that [place] causes her to have headaches, upset stomach and fevers.  She says that her chronic depression is unchanged.  She is no longer seeing a therapist.  She is under some financial pressure as she has transitioned into retirement and she does not seem to think that therapy is very helpful for her.  Her chronic leg pain from neuropathy is only slightly better since starting tramadol.  She walks with a cane.  She says that she does not do much walking because when she does she has swelling of her right lower leg.  She does not use compressive stockings because she says that they make the swelling worse.  Review of Systems: Review of Systems  Constitutional:  Positive for fever and malaise/fatigue. Negative for chills, diaphoresis and weight loss.  Gastrointestinal:  Positive for nausea. Negative for abdominal pain and vomiting.  Neurological:  Positive for sensory change and headaches.  Psychiatric/Behavioral:  Positive for  depression.     Past Medical History:  Diagnosis Date   Abnormal Pap smear of cervix before 1995   always a repeat back to normal except for 1 time and had colpo bioosy -resutls were normal   Allergic rhinitis    Anxiety    Bell's palsy after 1995 ?   no residual SE.   Depression    Genital herpes    History of drug abuse ([place])    drug usser [place].  cocaine   HIV (human immunodeficiency virus infection) ([place]) 1990   from sexual contact who is unknown   Syphilis    treated    Social History   Tobacco Use   Smoking status: Former    Types: Cigarettes    Quit date: 03/01/1990    Years since quitting: 32.9   Smokeless tobacco: Never  Vaping Use   Vaping Use: Never used  Substance Use Topics   Alcohol use: No   Drug use: No    Family History  Problem Relation Age of Onset   Lung cancer Mother    Diabetes Father    Breast cancer Other     Allergies  Allergen Reactions   Gabapentin     Swelling,  (Pt states was pre-gabapentin)   Haldol [Haloperidol] Other (See Comments)    Wipes out her memory   Nevirapine     REACTION: Rash 10/08   Thorazine [Chlorpromazine] Other (See Comments)    "makes me comatose"    Health Maintenance  Topic Date Due   COVID-19 Vaccine (5 - 2023-24 season) 10/13/2022   MAMMOGRAM  08/03/2023   Fecal DNA (Cologuard)  08/14/2024   PAP SMEAR-Modifier  01/07/2026   DTaP/Tdap/Td (2 - Td or Tdap) 10/10/2026   INFLUENZA VACCINE  Completed   Hepatitis C Screening  Completed   HIV Screening  Completed   Zoster Vaccines- Shingrix  Completed   HPV VACCINES  Aged Out    Objective:  Vitals:   01/16/23 0951  BP: 133/83  Pulse: 75  Temp: 98.5 F (36.9 C)  TempSrc: Oral  SpO2: 97%  Weight: 141 lb (64 kg)   Body mass index is 25.79 kg/m.  Physical Exam Constitutional:      Comments: She is quiet and pleasant but with a flat affect.  She became somewhat tearful when talking about my upcoming retirement.  She would like to  transition to a female provider.  Cardiovascular:     Rate and Rhythm: Normal rate.  Pulmonary:     Effort: Pulmonary effort is normal.     Lab Results Lab Results  Component Value Date   WBC 3.1 (L) 10/04/2022   HGB 11.1 (L) 10/04/2022   HCT 35.3 10/04/2022   MCV 86.5 10/04/2022   PLT 187 10/04/2022    Lab Results  Component Value Date   CREATININE 1.19 (H) 10/04/2022  BUN 14 10/04/2022   NA 139 10/04/2022   K 4.3 10/04/2022   CL 106 10/04/2022   CO2 27 10/04/2022    Lab Results  Component Value Date   ALT 11 10/04/2022   AST 19 10/04/2022   ALKPHOS 95 05/11/2022   BILITOT 0.7 10/04/2022    Lab Results  Component Value Date   CHOL 183 10/04/2022   HDL 41 (L) 10/04/2022   LDLCALC 117 (H) 10/04/2022   LDLDIRECT 102.1 03/02/2009   TRIG 136 10/04/2022   CHOLHDL 4.5 10/04/2022   Lab Results  Component Value Date   LABRPR NON-REACTIVE 10/04/2022   HIV 1 RNA Quant (Copies/mL)  Date Value  11/21/2022 <20 (H)  10/04/2022 58,100 (H)  05/01/2022 18,600 (H)   CD4 T Cell Abs (/uL)  Date Value  11/21/2022 126 (L)  05/01/2022 168 (L)  10/10/2021 137 (L)     Problem List Items Addressed This Visit       High   Human immunodeficiency virus (HIV) disease ([place]) - Primary    Her CD4 count has continued to trend downward since starting Biktarvy but the virus suppressed to undetectable levels very rapidly.  In the past she has had the luxury of staying off of antiretrovirals because her CD4 count was relatively high.  However now that it is below 200 that is much more risky for her to consider.  I told her that even though she is having some side effects it is best that she continue to take it every evening.  Currently, she is in agreement with that plan.  I will update her lab work today and see her back next month.      Relevant Orders   T-helper cells (CD4) count (not at [facility])   HIV-1 RNA quant-no reflex-bld      Michel Bickers, MD [name] [phone] pager   [phone] cell 01/16/2023, 10:12 AM

## 2023-01-16 NOTE — Assessment & Plan Note (Signed)
Her CD4 count has continued to trend downward since starting Biktarvy but the virus suppressed to undetectable levels very rapidly.  In the past she has had the luxury of staying off of antiretrovirals because her CD4 count was relatively high.  However now that it is below 200 that is much more risky for her to consider.  I told her that even though she is having some side effects it is best that she continue to take it every evening.  Currently, she is in agreement with that plan.  I will update her lab work today and see her back next month.

## 2023-01-17 ENCOUNTER — Encounter: Payer: Self-pay | Admitting: Internal Medicine

## 2023-01-17 LAB — T-HELPER CELLS (CD4) COUNT (NOT AT ARMC)
CD4 % Helper T Cell: 7 % — ABNORMAL LOW (ref 33–65)
CD4 T Cell Abs: 119 /uL — ABNORMAL LOW (ref 400–1790)

## 2023-01-18 LAB — HIV-1 RNA QUANT-NO REFLEX-BLD
HIV 1 RNA Quant: 125 Copies/mL — ABNORMAL HIGH
HIV-1 RNA Quant, Log: 2.1 Log cps/mL — ABNORMAL HIGH

## 2023-01-22 ENCOUNTER — Ambulatory Visit (HOSPITAL_BASED_OUTPATIENT_CLINIC_OR_DEPARTMENT_OTHER): Payer: BC Managed Care – PPO | Admitting: Obstetrics & Gynecology

## 2023-01-23 ENCOUNTER — Encounter: Payer: Self-pay | Admitting: Internal Medicine

## 2023-01-23 ENCOUNTER — Other Ambulatory Visit: Payer: Self-pay

## 2023-01-23 ENCOUNTER — Ambulatory Visit: Payer: BC Managed Care – PPO | Admitting: Internal Medicine

## 2023-01-23 VITALS — BP 119/75 | HR 86 | Temp 97.6°F | Wt 141.0 lb

## 2023-01-23 DIAGNOSIS — B20 Human immunodeficiency virus [HIV] disease: Secondary | ICD-10-CM

## 2023-01-23 NOTE — Assessment & Plan Note (Addendum)
It continues to be a challenge to keep Stephanie Espinoza on an effective antiretroviral regimen.  I do not believe that all of the symptoms she ascribes to [facility] are not true adverse side effects.  I am concerned that her depression is a major contributor to her inability to tolerate her medication.  I encouraged her to talk to her PCP about other options to try to manage her depression more effectively.  She agrees to stay on [place] for now.

## 2023-01-23 NOTE — Progress Notes (Signed)
Patient Active Problem List   Diagnosis Date Noted   Human immunodeficiency virus (HIV) disease ([place]) 09/06/2006    Priority: High   GENITAL HERPES 09/06/2006    Priority: Medium    Encounter for general adult medical examination with abnormal findings 11/25/2022   Peripheral neuropathy 10/22/2022   Right foot pain 05/22/2022   Right lumbar radiculopathy 05/08/2022   Abnormal CXR 02/15/2022   DDD (degenerative disc disease), lumbar 02/12/2022   Anterolisthesis of lumbosacral spine 02/12/2022   Allergic reaction to bee sting 07/27/2021   Weakness of both legs 03/07/2021   B12 deficiency 03/07/2021   Urinary incontinence 05/16/2020   Other fatigue 05/16/2020   History of syphilis 05/14/2020   Memory loss 02/14/2020   Insomnia 02/14/2020   Herpes zoster 12/31/2019   Normocytic anemia 03/01/2019   Hyperglycemia 03/01/2019   Headache 06/30/2018   Urinary frequency 05/01/2018   Cervical 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 (HPV) DNA test positive 10/18/2016   Essential hypertension 09/16/2014   Vasomotor rhinitis 05/05/2014   Benign paroxysmal positional vertigo 09/21/2012   Sinusitis 03/22/2009   Depression 09/06/2006    Patient's Medications  New Prescriptions   No medications on file  Previous Medications   ACYCLOVIR OINTMENT (ZOVIRAX) 5 %    Apply topically every 3 (three) hours. Use as needed for symptoms relief for no more than 7 days.   ALBUTEROL (VENTOLIN HFA) 108 (90 BASE) MCG/ACT INHALER    Inhale 1-2 puffs into the lungs every 4 (four) hours as needed for wheezing or shortness of breath.   AMLODIPINE (NORVASC) 5 MG TABLET    TAKE 1 TABLET BY MOUTH AT NIGHT   BICTEGRAVIR-EMTRICITABINE-TENOFOVIR AF (BIKTARVY) 50-200-25 MG TABS TABLET    Take 1 tablet by mouth daily.   CYCLOBENZAPRINE (FLEXERIL) 5 MG TABLET    TAKE 1 TABLET BY MOUTH THREE TIMES A DAY AS NEEDED FOR MUSCLE SPASMS   EPINEPHRINE (EPIPEN 2-PAK) 0.3 MG/0.3 ML IJ SOAJ INJECTION    Inject 0.3 mg into  the muscle as needed for anaphylaxis.   FLUTICASONE (FLONASE) 50 MCG/ACT NASAL SPRAY    Place 2 sprays into both nostrils daily.   LIDOCAINE (LIDODERM) 5 %    Place 1 patch onto the skin daily. Remove & Discard patch within 12 hours or as directed by MD   SULFAMETHOXAZOLE-TRIMETHOPRIM (BACTRIM DS) 800-160 MG TABLET    TAKE 1 TABLET BY MOUTH EVERY DAY   TRAMADOL (ULTRAM) 50 MG TABLET    Take 50 mg by mouth every 4 (four) hours as needed.  Modified Medications   No medications on file  Discontinued Medications   No medications on file    Subjective: Stephanie Espinoza is seen on a work in basis.  Shortly after her visit last week she messaged me complaining of multiple symptoms that she felt were related to [place].  She mentions headache, fatigue, AM nausea, and problems with memory and concentration.  She has been on multiple different antiretroviral regimens over the last 27 years and she has always had difficulty tolerating each regimen and is rarely stayed on a regimen more than a few months.  Her past regimens include:  2008: Nevirapine and Truvada 2009-2010: Kaletra and Truvada 2011-2013: Truvada + [place] + Norvir 2014-2016: Complera 2017: Odefsey 2018Jorje Guild 2018-2022: Biktarvy 2022: [place]  She now tells me that she makes a cup of tea and has a piece of toast as soon as she wakes up in the morning.  This settles  her stomach and helps prevent severe nausea.  It also helps with her headache.  She now says that she believes she can continue to take her Biktarvy.  Review of Systems: Review of Systems  Constitutional:  Positive for malaise/fatigue. Negative for fever and weight loss.  Gastrointestinal:  Positive for nausea. Negative for abdominal pain, constipation, diarrhea and vomiting.  Neurological:  Positive for headaches.  Psychiatric/Behavioral:  Positive for depression.     Past Medical History:  Diagnosis Date   Abnormal Pap smear of cervix before 1995   always a repeat back to  normal except for 1 time and had colpo bioosy -resutls were normal   Allergic rhinitis    Anxiety    Bell's palsy after 1995 ?   no residual SE.   Depression    Genital herpes    History of drug abuse ([place])    drug usser [place].  cocaine   HIV (human immunodeficiency virus infection) ([place]) 1990   from sexual contact who is unknown   Syphilis    treated    Social History   Tobacco Use   Smoking status: Former    Types: Cigarettes    Quit date: 03/01/1990    Years since quitting: 32.9   Smokeless tobacco: Never  Vaping Use   Vaping Use: Never used  Substance Use Topics   Alcohol use: No   Drug use: No    Family History  Problem Relation Age of Onset   Lung cancer Mother    Diabetes Father    Breast cancer Other     Allergies  Allergen Reactions   Gabapentin     Swelling,  (Pt states was pre-gabapentin)   Haldol [Haloperidol] Other (See Comments)    Wipes out her memory   Nevirapine     REACTION: Rash 10/08   Thorazine [Chlorpromazine] Other (See Comments)    "makes me comatose"    Health Maintenance  Topic Date Due   COVID-19 Vaccine (5 - 2023-24 season) 10/13/2022   MAMMOGRAM  08/03/2023   Fecal DNA (Cologuard)  08/14/2024   PAP SMEAR-Modifier  01/07/2026   DTaP/Tdap/Td (2 - Td or Tdap) 10/10/2026   INFLUENZA VACCINE  Completed   Hepatitis C Screening  Completed   HIV Screening  Completed   Zoster Vaccines- Shingrix  Completed   HPV VACCINES  Aged Out    Objective:  Vitals:   01/23/23 0933  BP: 119/75  Pulse: 86  Temp: 97.6 F (36.4 C)  TempSrc: Oral  SpO2: 97%  Weight: 141 lb (64 kg)   Body mass index is 25.79 kg/m.  Physical Exam Constitutional:      Comments: She is calm and pleasant.  Cardiovascular:     Rate and Rhythm: Normal rate.  Pulmonary:     Effort: Pulmonary effort is normal.  Psychiatric:        Mood and Affect: Mood normal.     Lab Results Lab Results  Component Value Date   WBC 3.1 (L) 10/04/2022   HGB  11.1 (L) 10/04/2022   HCT 35.3 10/04/2022   MCV 86.5 10/04/2022   PLT 187 10/04/2022    Lab Results  Component Value Date   CREATININE 1.19 (H) 10/04/2022   BUN 14 10/04/2022   NA 139 10/04/2022   K 4.3 10/04/2022   CL 106 10/04/2022   CO2 27 10/04/2022    Lab Results  Component Value Date   ALT 11 10/04/2022   AST 19 10/04/2022  ALKPHOS 95 05/11/2022   BILITOT 0.7 10/04/2022    Lab Results  Component Value Date   CHOL 183 10/04/2022   HDL 41 (L) 10/04/2022   LDLCALC 117 (H) 10/04/2022   LDLDIRECT 102.1 03/02/2009   TRIG 136 10/04/2022   CHOLHDL 4.5 10/04/2022   Lab Results  Component Value Date   LABRPR NON-REACTIVE 10/04/2022   HIV 1 RNA Quant (Copies/mL)  Date Value  01/16/2023 125 (H)  11/21/2022 <20 (H)  10/04/2022 58,100 (H)   CD4 T Cell Abs (/uL)  Date Value  01/16/2023 119 (L)  11/21/2022 126 (L)  05/01/2022 168 (L)     Problem List Items Addressed This Visit       High   Human immunodeficiency virus (HIV) disease ([place]) - Primary    It continues to be a challenge to keep Stephanie Espinoza on an effective antiretroviral regimen.  I do not believe that all of the symptoms she ascribes to [facility] are not true adverse side effects.  I am concerned that her depression is a major contributor to her inability to tolerate her medication.  I encouraged her to talk to her PCP about other options to try to manage her depression more effectively.  She agrees to stay on [place] for now.         Michel Bickers, MD [facility] [place] [facility] [phone] pager   [phone] cell 01/23/2023, 10:25 AM

## 2023-01-24 NOTE — Telephone Encounter (Signed)
Patient dropped off document FMLA, to be filled out by provider. Patient requested to send it via Fax within 5-days. Document is located in providers tray at front office.Please advise at [place]

## 2023-01-30 ENCOUNTER — Ambulatory Visit: Payer: BC Managed Care – PPO | Admitting: Internal Medicine

## 2023-01-30 ENCOUNTER — Encounter: Payer: Self-pay | Admitting: Internal Medicine

## 2023-01-30 VITALS — BP 118/74 | HR 78 | Temp 98.4°F | Ht 62.0 in | Wt 141.2 lb

## 2023-01-30 DIAGNOSIS — G63 Polyneuropathy in diseases classified elsewhere: Secondary | ICD-10-CM

## 2023-01-30 DIAGNOSIS — F0390 Unspecified dementia without behavioral disturbance: Secondary | ICD-10-CM

## 2023-01-30 DIAGNOSIS — B20 Human immunodeficiency virus [HIV] disease: Secondary | ICD-10-CM

## 2023-01-30 NOTE — Assessment & Plan Note (Signed)
We have discussed HAND and likely she does have concentration deficit. We had first noticed this most 2022 and MRI was normal which is not unusual with this. She has struggled with concentration deficits and multi-tasking which made her job progressively more difficult and she is unable to do that employment currently.

## 2023-01-30 NOTE — Assessment & Plan Note (Signed)
Advised to stay on current HAART regimen. She does have low CD4 count and this has not yet rebounded significantly with viral suppression. She is aware that she is likely to have more complications and problems due to low nadir on CD4.

## 2023-01-30 NOTE — Progress Notes (Signed)
   Subjective:   Patient ID: Stephanie Espinoza, female    DOB: January 22, 1956, 67 y.o.   MRN: [id number]  HPI The patient is a 67 YO female coming in for follow up. Is still working on getting long term disability done and needs some forms today.   Review of Systems  Constitutional:  Positive for activity change.  HENT: Negative.    Eyes: Negative.   Respiratory:  Negative for cough, chest tightness and shortness of breath.   Cardiovascular:  Negative for chest pain, palpitations and leg swelling.  Gastrointestinal:  Negative for abdominal distention, abdominal pain, constipation, diarrhea, nausea and vomiting.  Musculoskeletal:  Positive for arthralgias.  Skin: Negative.   Neurological:  Positive for numbness.  Psychiatric/Behavioral:  Positive for decreased concentration and dysphoric mood.     Objective:  Physical Exam Constitutional:      Appearance: She is well-developed.  HENT:     Head: Normocephalic and atraumatic.  Cardiovascular:     Rate and Rhythm: Normal rate and regular rhythm.  Pulmonary:     Effort: Pulmonary effort is normal. No respiratory distress.     Breath sounds: Normal breath sounds. No wheezing or rales.  Abdominal:     General: Bowel sounds are normal. There is no distension.     Palpations: Abdomen is soft.     Tenderness: There is no abdominal tenderness. There is no rebound.  Musculoskeletal:     Cervical back: Normal range of motion.  Skin:    General: Skin is warm and dry.  Neurological:     Mental Status: She is alert and oriented to person, place, and time.     Sensory: Sensory deficit present.     Coordination: Coordination abnormal.     Vitals:   01/30/23 1022  BP: 118/74  Pulse: 78  Temp: 98.4 F (36.9 C)  TempSrc: Oral  SpO2: 97%  Weight: 141 lb 4 oz (64.1 kg)  Height: 5\' 2"  (1.575 m)    Assessment & Plan:  Visit time 20 minutes in face to face communication with patient and coordination of care, additional 10 minutes spent in  record review, coordination or care, ordering tests, communicating/referring to other healthcare professionals, documenting in medical records all on the same day of the visit for total time 30 minutes spent on the visit.

## 2023-01-30 NOTE — Assessment & Plan Note (Signed)
She is having a good stretch with this but the symptoms are unpredictable. Using tramadol more at night time to help her sleep. Daytime she is unable to use as this comes and goes randomly. She is unable to work due to this condition. It does limit her walking.

## 2023-01-30 NOTE — Patient Instructions (Signed)
We have sent in the forms and do not need labs today.

## 2023-02-08 ENCOUNTER — Encounter: Payer: Self-pay | Admitting: Podiatry

## 2023-02-16 ENCOUNTER — Encounter: Payer: Self-pay | Admitting: Internal Medicine

## 2023-02-24 ENCOUNTER — Other Ambulatory Visit: Payer: Self-pay | Admitting: Internal Medicine

## 2023-02-24 DIAGNOSIS — B20 Human immunodeficiency virus [HIV] disease: Secondary | ICD-10-CM

## 2023-02-27 ENCOUNTER — Telehealth: Payer: Self-pay | Admitting: Pharmacist

## 2023-02-27 ENCOUNTER — Ambulatory Visit: Payer: BC Managed Care – PPO | Admitting: Internal Medicine

## 2023-02-27 ENCOUNTER — Other Ambulatory Visit: Payer: Self-pay | Admitting: Pharmacist

## 2023-02-27 NOTE — Telephone Encounter (Signed)
Stephanie Espinoza from [facility] reached out to me regarding this patient. She is having trouble falling asleep and wanted to speak to someone about her medications. She has a long history of having trouble tolerating medications, especially medications used to threat her HIV. Called her this morning to discuss. She states that she doe not fall asleep until ~4am and this has been occurring for the last 2-3 weeks. She recently started taking gabapentin and tramadol over the last few months from her pain doctor. She has been taking Biktarvy for quite some time. She does not wish to switch to any other ART as she states that she actually prefers the one she is taking.   I advised that I didn't think the gabapentin or the Biktarvy were the culprits of her insomnia but that tramadol possibly could be. Tramadol can make you drowsy but can also cause you to sleep less and not as deep. She usually takes all of her medications at night around 730-8pm before bedtime because if she takes them during the day, she has side effects such as itchiness, swelling, and headaches. I asked her if she would be willing to take her tramadol during the day instead of right before bed. She likes this plan and agrees to it. I asked her to give it through the weekend and some next week before reaching back out. She would benefit from reaching out to her pain doctor as well, because I do not think this issue is caused by [employer]. All questions answered.  Maury Groninger L. Rakeen Gaillard, PharmD, BCIDP, AAHIVP, CPP Clinical Pharmacist Practitioner Infectious Diseases Clinical Pharmacist [facility] 02/27/2023, 9:36 AM

## 2023-03-03 ENCOUNTER — Ambulatory Visit: Payer: BC Managed Care – PPO | Admitting: Pharmacist

## 2023-03-04 ENCOUNTER — Other Ambulatory Visit: Payer: Self-pay | Admitting: Internal Medicine

## 2023-03-04 DIAGNOSIS — B20 Human immunodeficiency virus [HIV] disease: Secondary | ICD-10-CM

## 2023-03-14 ENCOUNTER — Encounter: Payer: Self-pay | Admitting: Internal Medicine

## 2023-03-17 ENCOUNTER — Other Ambulatory Visit: Payer: Self-pay

## 2023-03-17 ENCOUNTER — Other Ambulatory Visit: Payer: BC Managed Care – PPO

## 2023-03-17 DIAGNOSIS — B20 Human immunodeficiency virus [HIV] disease: Secondary | ICD-10-CM

## 2023-03-18 LAB — T-HELPER CELL (CD4) - (RCID CLINIC ONLY)
CD4 % Helper T Cell: 7 % — ABNORMAL LOW (ref 33–65)
CD4 T Cell Abs: 119 /uL — ABNORMAL LOW (ref 400–1790)

## 2023-03-21 LAB — CBC WITH DIFFERENTIAL/PLATELET
Absolute Monocytes: 490 cells/uL (ref 200–950)
Basophils Absolute: 60 cells/uL (ref 0–200)
Basophils Relative: 0.7 %
Eosinophils Absolute: 189 cells/uL (ref 15–500)
Eosinophils Relative: 2.2 %
HCT: 36.5 % (ref 35.0–45.0)
Hemoglobin: 11.7 g/dL (ref 11.7–15.5)
Lymphs Abs: 1969 cells/uL (ref 850–3900)
MCH: 29.1 pg (ref 27.0–33.0)
MCHC: 32.1 g/dL (ref 32.0–36.0)
MCV: 90.8 fL (ref 80.0–100.0)
MPV: 10.1 fL (ref 7.5–12.5)
Monocytes Relative: 5.7 %
Neutro Abs: 5891 cells/uL (ref 1500–7800)
Neutrophils Relative %: 68.5 %
Platelets: 255 10*3/uL (ref 140–400)
RBC: 4.02 10*6/uL (ref 3.80–5.10)
RDW: 12.6 % (ref 11.0–15.0)
Total Lymphocyte: 22.9 %
WBC: 8.6 10*3/uL (ref 3.8–10.8)

## 2023-03-21 LAB — COMPLETE METABOLIC PANEL WITH GFR
AG Ratio: 1 (calc) (ref 1.0–2.5)
ALT: 16 U/L (ref 6–29)
AST: 21 U/L (ref 10–35)
Albumin: 3.8 g/dL (ref 3.6–5.1)
Alkaline phosphatase (APISO): 104 U/L (ref 37–153)
BUN/Creatinine Ratio: 12 (calc) (ref 6–22)
BUN: 15 mg/dL (ref 7–25)
CO2: 28 mmol/L (ref 20–32)
Calcium: 9.8 mg/dL (ref 8.6–10.4)
Chloride: 104 mmol/L (ref 98–110)
Creat: 1.25 mg/dL — ABNORMAL HIGH (ref 0.50–1.05)
Globulin: 3.9 g/dL (calc) — ABNORMAL HIGH (ref 1.9–3.7)
Glucose, Bld: 101 mg/dL — ABNORMAL HIGH (ref 65–99)
Potassium: 3.9 mmol/L (ref 3.5–5.3)
Sodium: 141 mmol/L (ref 135–146)
Total Bilirubin: 0.7 mg/dL (ref 0.2–1.2)
Total Protein: 7.7 g/dL (ref 6.1–8.1)
eGFR: 48 mL/min/{1.73_m2} — ABNORMAL LOW (ref >=60)

## 2023-03-21 LAB — HIV-1 RNA QUANT-NO REFLEX-BLD
HIV 1 RNA Quant: NOT DETECTED Copies/mL
HIV-1 RNA Quant, Log: NOT DETECTED Log cps/mL

## 2023-03-24 ENCOUNTER — Other Ambulatory Visit: Payer: Self-pay

## 2023-03-24 ENCOUNTER — Encounter: Payer: Self-pay | Admitting: Internal Medicine

## 2023-03-24 ENCOUNTER — Ambulatory Visit (INDEPENDENT_AMBULATORY_CARE_PROVIDER_SITE_OTHER): Payer: BC Managed Care – PPO | Admitting: Internal Medicine

## 2023-03-24 VITALS — BP 118/77 | HR 78 | Temp 98.1°F | Wt 145.2 lb

## 2023-03-24 DIAGNOSIS — Z789 Other specified health status: Secondary | ICD-10-CM

## 2023-03-24 DIAGNOSIS — Z79899 Other long term (current) drug therapy: Secondary | ICD-10-CM | POA: Diagnosis not present

## 2023-03-24 DIAGNOSIS — B20 Human immunodeficiency virus [HIV] disease: Secondary | ICD-10-CM

## 2023-03-24 MED ORDER — ROSUVASTATIN CALCIUM 5 MG PO TABS
5.0000 mg | ORAL_TABLET | Freq: Every day | ORAL | 11 refills | Status: DC
Start: 2023-03-24 — End: 2023-07-08

## 2023-03-24 NOTE — Progress Notes (Unsigned)
Patient ID: Stephanie Espinoza, female   DOB: 09-28-1956, 67 y.o.   MRN: [id number]  HPI 67yo F with longstanding hiv disease, CD 4 count of 119/VL<20 in may 2024. Also on bactrim daily for oi proph. She takes daily without issue. No new complaints but does have ongoing  Peripheral neuropathy - she is on tramadol and now amyltriptiline.. didn't adjust to taking neurontin.  Her past regimens include:   2008: Nevirapine and Truvada 2009-2010: Kaletra and Truvada 2011-2013: Truvada + Reyataz + Norvir 2014-2016: Complera 2017: Odefsey 2018Darrin Luis 2018-2022: Biktarvy 2022: Dovato 01/2023: switched back to biktary  Outpatient Encounter Medications as of 03/24/2023  Medication Sig   acyclovir ointment (ZOVIRAX) 5 % Apply topically every 3 (three) hours. Use as needed for symptoms relief for no more than 7 days.   albuterol (VENTOLIN HFA) 108 (90 Base) MCG/ACT inhaler Inhale 1-2 puffs into the lungs every 4 (four) hours as needed for wheezing or shortness of breath.   amitriptyline (ELAVIL) 10 MG tablet Take 10 mg by mouth at bedtime.   amLODipine (NORVASC) 5 MG tablet TAKE 1 TABLET BY MOUTH AT NIGHT   bictegravir-emtricitabine-tenofovir AF (BIKTARVY) 50-200-25 MG TABS tablet TAKE 1 TABLET BY MOUTH 1 TIME A DAY   cyclobenzaprine (FLEXERIL) 5 MG tablet TAKE 1 TABLET BY MOUTH THREE TIMES A DAY AS NEEDED FOR MUSCLE SPASMS   EPINEPHrine (EPIPEN 2-PAK) 0.3 mg/0.3 mL IJ SOAJ injection Inject 0.3 mg into the muscle as needed for anaphylaxis.   fluticasone (FLONASE) 50 MCG/ACT nasal spray Place 2 sprays into both nostrils daily. (Patient taking differently: Place 2 sprays into both nostrils daily as needed.)   lidocaine (LIDODERM) 5 % Place 1 patch onto the skin daily. Remove & Discard patch within 12 hours or as directed by MD   sulfamethoxazole-trimethoprim (BACTRIM DS) 800-160 MG tablet TAKE 1 TABLET BY MOUTH EVERY DAY   traMADol (ULTRAM) 50 MG tablet Take 50 mg by mouth every 4 (four) hours as  needed.   gabapentin (NEURONTIN) 100 MG capsule Take 100 mg by mouth at bedtime.   No facility-administered encounter medications on file as of 03/24/2023.     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   Right foot pain 05/22/2022   Right lumbar radiculopathy 05/08/2022   Abnormal CXR 02/15/2022   DDD (degenerative disc disease), lumbar 02/12/2022   Anterolisthesis of lumbosacral spine 02/12/2022   Allergic reaction to bee sting 07/27/2021   Weakness of both legs 03/07/2021   B12 deficiency 03/07/2021   Urinary incontinence 05/16/2020   Other fatigue 05/16/2020   History of syphilis 05/14/2020   Memory loss 02/14/2020   Insomnia 02/14/2020   Herpes zoster 12/31/2019   Normocytic anemia 03/01/2019   Hyperglycemia 03/01/2019   Headache 06/30/2018   Urinary frequency 05/01/2018   Cervical 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 (HPV) DNA test positive 10/18/2016   Essential hypertension 09/16/2014   Vasomotor rhinitis 05/05/2014   Benign paroxysmal positional vertigo 09/21/2012   Sinusitis 03/22/2009   Human immunodeficiency virus (HIV) disease (HCC) 09/06/2006   GENITAL HERPES 09/06/2006   Depression 09/06/2006     Health Maintenance Due  Topic Date Due   COVID-19 Vaccine (5 - 2023-24 season) 10/13/2022     Review of Systems 12 point ros is negative except for oa of knee joints and peripheral  neuropathy per hpi Physical Exam   BP 118/77   Pulse 78   Temp 98.1 F (36.7 C) (Oral)   Wt 145 lb 3.2 oz (65.9 kg)   LMP 11/04/1997   SpO2 100%   BMI 26.56 kg/m   Physical Exam  Constitutional:  oriented to person, place, and time. appears well-developed and well-nourished. No distress.  HENT: [place]/AT, PERRLA, no scleral icterus Mouth/Throat: Oropharynx is clear and moist. No oropharyngeal  exudate.  Cardiovascular: Normal rate, regular rhythm and normal heart sounds. Exam reveals no gallop and no friction rub.  No murmur heard.  Pulmonary/Chest: Effort normal and breath sounds normal. No respiratory distress.  has no wheezes.  Neck = supple, no nuchal rigidity Lymphadenopathy: no cervical adenopathy. No axillary adenopathy Neurological: alert and oriented to person, place, and time.  Skin: Skin is warm and dry. No rash noted. No erythema.  Psychiatric: a normal mood and affect.  behavior is normal.   Lab Results  Component Value Date   CD4TCELL 7 (L) 03/17/2023   Lab Results  Component Value Date   CD4TABS 119 (L) 03/17/2023   CD4TABS 119 (L) 01/16/2023   CD4TABS 126 (L) 11/21/2022   Lab Results  Component Value Date   HIV1RNAQUANT Not Detected 03/17/2023   Lab Results  Component Value Date   HEPBSAB No 12/29/2006   Lab Results  Component Value Date   LABRPR NON-REACTIVE 10/04/2022    CBC Lab Results  Component Value Date   WBC 8.6 03/17/2023   RBC 4.02 03/17/2023   HGB 11.7 03/17/2023   HCT 36.5 03/17/2023   PLT 255 03/17/2023   MCV 90.8 03/17/2023   MCH 29.1 03/17/2023   MCHC 32.1 03/17/2023   RDW 12.6 03/17/2023   LYMPHSABS 1,969 03/17/2023   MONOABS 0.2 05/11/2022   EOSABS 189 03/17/2023    BMET Lab Results  Component Value Date   NA 141 03/17/2023   K 3.9 03/17/2023   CL 104 03/17/2023   CO2 28 03/17/2023   GLUCOSE 101 (H) 03/17/2023   BUN 15 03/17/2023   CREATININE 1.25 (H) 03/17/2023   CALCIUM 9.8 03/17/2023   GFRNONAA >60 05/11/2022   GFRAA >60 03/03/2019      Assessment and Plan  HIV disease, advanced but well controlled = Continue on biktarvy and bactrim  Long term medication management = cr stable. Continue on biktarvy  Health maintenance =Start low dose rosuvastatin for primary prevention; will check lipids at next visit  See back in 6 mo. Labs 2 wks prior to visit

## 2023-04-08 ENCOUNTER — Encounter: Payer: Self-pay | Admitting: Internal Medicine

## 2023-04-13 ENCOUNTER — Other Ambulatory Visit: Payer: Self-pay | Admitting: Internal Medicine

## 2023-04-14 ENCOUNTER — Other Ambulatory Visit: Payer: Self-pay

## 2023-04-14 DIAGNOSIS — R42 Dizziness and giddiness: Secondary | ICD-10-CM

## 2023-04-14 MED ORDER — FLUTICASONE PROPIONATE 50 MCG/ACT NA SUSP
2.0000 | Freq: Every day | NASAL | 2 refills | Status: AC
Start: 2023-04-14 — End: ?

## 2023-04-15 ENCOUNTER — Ambulatory Visit: Payer: BC Managed Care – PPO | Admitting: Internal Medicine

## 2023-04-16 DIAGNOSIS — Z79891 Long term (current) use of opiate analgesic: Secondary | ICD-10-CM | POA: Diagnosis not present

## 2023-04-16 DIAGNOSIS — G8929 Other chronic pain: Secondary | ICD-10-CM | POA: Diagnosis not present

## 2023-04-16 DIAGNOSIS — B20 Human immunodeficiency virus [HIV] disease: Secondary | ICD-10-CM | POA: Diagnosis not present

## 2023-05-05 ENCOUNTER — Encounter (HOSPITAL_BASED_OUTPATIENT_CLINIC_OR_DEPARTMENT_OTHER): Payer: Self-pay | Admitting: Physical Therapy

## 2023-05-05 ENCOUNTER — Ambulatory Visit: Payer: BC Managed Care – PPO | Admitting: Internal Medicine

## 2023-05-14 DIAGNOSIS — G8929 Other chronic pain: Secondary | ICD-10-CM | POA: Diagnosis not present

## 2023-05-14 DIAGNOSIS — B20 Human immunodeficiency virus [HIV] disease: Secondary | ICD-10-CM | POA: Diagnosis not present

## 2023-05-14 DIAGNOSIS — M79672 Pain in left foot: Secondary | ICD-10-CM | POA: Diagnosis not present

## 2023-05-14 DIAGNOSIS — M79671 Pain in right foot: Secondary | ICD-10-CM | POA: Diagnosis not present

## 2023-05-14 DIAGNOSIS — Z79891 Long term (current) use of opiate analgesic: Secondary | ICD-10-CM | POA: Diagnosis not present

## 2023-05-24 ENCOUNTER — Other Ambulatory Visit: Payer: Self-pay | Admitting: Internal Medicine

## 2023-06-03 NOTE — Therapy (Signed)
OUTPATIENT PHYSICAL THERAPY LOWER EXTREMITY EVALUATION   Patient Name: Stephanie Espinoza MRN: [id number] DOB:October 20, 1956, 67 y.o., female Today's Date: 06/04/2023  END OF SESSION:  PT End of Session - 06/04/23 0943     Visit Number 1    Number of Visits 6    Date for PT Re-Evaluation 07/16/23    Authorization Type [employer] and [employer]    PT Start Time [phone]    PT Stop Time 1027    PT Time Calculation (min) 51 min    Activity Tolerance Patient tolerated treatment well    Behavior During Therapy WFL for tasks assessed/performed             Past Medical History:  Diagnosis Date   Abnormal Pap smear of cervix before 1995   always a repeat back to normal except for 1 time and had colpo bioosy -resutls were normal   Allergic rhinitis    Anxiety    Bell's palsy after 1995 ?   no residual SE.   Depression    Genital herpes    History of drug abuse (HCC)    drug usser 1984 - 1990.  cocaine   HIV (human immunodeficiency virus infection) (HCC) 1990   from sexual contact who is unknown   Syphilis    treated   Past Surgical History:  Procedure Laterality Date   CESAREAN SECTION  1997   ESOPHAGOGASTRODUODENOSCOPY (EGD) WITH PROPOFOL N/A 03/02/2019   Procedure: ESOPHAGOGASTRODUODENOSCOPY (EGD) WITH PROPOFOL;  Surgeon: Willis Modena, MD;  Location: [facility];  Service: Endoscopy;  Laterality: N/A;   EUS N/A 03/02/2019   Procedure: FULL UPPER ENDOSCOPIC ULTRASOUND (EUS) RADIAL;  Surgeon: Willis Modena, MD;  Location: [facility];  Service: Endoscopy;  Laterality: N/A;   LAPAROSCOPIC CHOLECYSTECTOMY SINGLE PORT N/A 03/01/2019   Procedure: LAPAROSCOPIC CHOLECYSTECTOMY SINGLE SITE/ INTRAOPERATIVE CHOLANGIOGRAM;  Surgeon: Karie Soda, MD;  Location: [facility];  Service: General;  Laterality: N/A;   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   Encounter for general adult medical examination with abnormal findings  11/25/2022   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/22/2022   Right foot pain 05/22/2022   Right lumbar radiculopathy 05/08/2022   Abnormal CXR 02/15/2022   DDD (degenerative disc disease), lumbar 02/12/2022   Anterolisthesis of lumbosacral spine 02/12/2022   Allergic reaction to bee sting 07/27/2021   Weakness of both legs 03/07/2021   B12 deficiency 03/07/2021   Urinary incontinence 05/16/2020   Other fatigue 05/16/2020   History of syphilis 05/14/2020   Memory loss 02/14/2020   Insomnia 02/14/2020   Herpes zoster 12/31/2019   Normocytic anemia 03/01/2019   Hyperglycemia 03/01/2019   Headache 06/30/2018   Urinary frequency 05/01/2018   Cervical 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 (HPV) DNA test positive 10/18/2016   Essential hypertension 09/16/2014   Vasomotor rhinitis 05/05/2014   Benign paroxysmal positional vertigo 09/21/2012   Sinusitis 03/22/2009   Human immunodeficiency virus (HIV) disease (HCC) 09/06/2006   GENITAL HERPES 09/06/2006   Depression 09/06/2006     REFERRING PROVIDER: Janeece Riggers, FNP  REFERRING DIAG: G63  Polyneuropathy in diseases classified elsewhere  THERAPY DIAG:  Other disturbances of skin sensation  Muscle weakness (generalized)  Stiffness of right ankle, not elsewhere classified  Stiffness of left ankle, not elsewhere classified  Pain in right lower leg  Pain in left lower leg  Difficulty in walking, not elsewhere classified  Rationale for Evaluation and Treatment: Rehabilitation  ONSET  DATE: PT order 04/16/23 / Exacerbation July 2023  SUBJECTIVE:   SUBJECTIVE STATEMENT:  PT order indicated bilat ankle strengthening, gait evaluation, HEP, and aquatic therapy program.  PT order indicated 1-2x/wk x 4-6 weeks, do not schedule more than 6 visits.  If the pt is unable to tolerate, please send back to [facility].  Please fax PT note after each visit.    Pt reports having N/T in bilat feet beginning 5 years ago.  Pt was informed at that time she  had a pinched nerve.  Pt had PT 2-3 years ago for her lumbar which did not change her sx's.  Pt has tried different meds.  She received chiropractor care which didn't help.  Pt received 2 ESI's also with the last one being April 2023  Pt woke up last July and she couldn't feel her feet and she couldn't walk.  Pt went to a neurosurgeon and had an EMG and Lumbar MRI performed.  She was informed it wasn't a pinched nerve, but was neuropathy.  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    "I have no sensation in my feet."  Pt reports having constant numbness in bilat feet.  Pt reports having decreased sensation from knees down bilat.  Pt uses a cane, FWW, and rollator if she is going out and has to stand for any length of time.  She did not bring those in today.  Pt is limited with ambulation distance and is unable to ambulate up an incline.  Pt reports she is very limited with standing duration and reports 5 mins max for standing.  Pt has to take stairs slow.  Pt has difficulty with cooking and breaks up her cleaning.  Pt limits her lifting.  Pt states her ankles feel weak.  PERTINENT HISTORY: HIV Pt states she has urinary/urge incontinence at times though can control it by what she drinks.  L5-S1 grade 1 anterolisthesis and lumbar DDD Pt reports having concentration and memory issues.  Hx of syphilis and B12 deficiency H/A, HTN, and depression BPPV--Pt states it's more seasonal with allergies.   PAIN:  Pt reports having a constant numbness in her feet.  She reports having decreased sensation in distal LE's.  She can have a burning pain, though states she mainly feels numbness, tingling, and stabbing.  PRECAUTIONS: Other: HIV, L5-S1 grade 1 anterolisthesis   WEIGHT BEARING RESTRICTIONS: No  FALLS:  Has patient fallen in last 6 months? No  LIVING ENVIRONMENT: Lives with: lives alone Lives in: 1 story  home Stairs: 5 steps to enter home with bilat rails Has following equipment at home: Dan Humphreys - 2 wheeled and rollator, quad cane  OCCUPATION: Pt is currently on disability.  PLOF: Independent  PATIENT GOALS: improve strength in ankles, improve sensation, decrease pain   OBJECTIVE:   DIAGNOSTIC FINDINGS:  EMG in 2023 showed peripheral neuropathy, no evidence for lumbosacaral radic or myelopathy in the R LE per MD notes  Lumbar MRI 2023:  facet hypertrophy at L5-S1 with resulting grade 1 anterolisthesis, annular disc bulging and at least moderate L > R NF narrowing, there is possible chronic encroachment on either L5 nn roots.  No other significant stenosis or nn root encroachment.  Low lying conus again noted without evidence of cord tethering.   PATIENT SURVEYS:  LEFS 22  COGNITION: Overall cognitive status: Within functional limits for tasks assessed     SENSATION: LT:  1+ in toes on bilat feet.  1+ t/o R foot and 2+ t/o L foot.  L5 on R 1+.  2+ in L3-S2 dermatomes on L.  2+ in L3, L4, S1, and S2 dermatomes on R.    LOWER EXTREMITY ROM:   ROM Right eval Left eval  Hip flexion    Hip extension    Hip abduction    Hip adduction    Hip internal rotation    Hip external rotation    Knee flexion Limited Limited  Knee extension    Ankle dorsiflexion Unable to actively perform.  Pt resting in 36 deg of PF.  PROM:  14 deg Unable to actively perform.  Pt resting in 40 deg of PF PROM:  12 deg  Ankle plantarflexion Unable to actively perform.  Pt resting in 36 deg of PF Unable to actively perform.  Pt resting in 40 deg of PF  Ankle inversion Unable to actively perform.  PROM:  10 Unable to actively perform.  PROM:  22  Ankle eversion Unable to actively perform.  PROM:  15 Unable to actively perform.  PROM:  5   (Blank rows = not tested)  LOWER EXTREMITY MMT:  Pt has significant weakness in bilat feet being unable to actively perform DF, PF, Eve, and Inv.  Pt has slight active  movement in 1st and 2nd toes bilat, but not in toes 3-5.  Hip flexion:  5/5 bilat Knee extension:  R:  3+/5  L:  4-/5 Knee flexion:  weak and limited with active movement    GAIT: Assistive device utilized: None Comments: flat foot initial contact.  Absent heel strike bilat, decreased toe off bilat.  Slow gait.  Pt used the wall for some support toward the end of gait assessment.   TODAY'S TREATMENT:                                                                                                                                Pt performed LAQ x5 reps bilat and seated heel slides with pillowcase on floor approx 5 reps bilat.  Pt attempted to perform toe flex/ext though has very slight movement in 1st and 2nd toes.  Pt received a HEP handout and was educated in correct form and appropriate frequency.     PATIENT EDUCATION:  Education details: aquatic therapy benefits and rationale, objective findings, dx, HEP, POC and relevant anatomy.  Instructed pt she would have to wear an adult swim diaper if she was having incontinence issues.   Person educated: Patient Education method: Explanation, Demonstration, Tactile cues, Verbal cues, and Handouts Education comprehension: verbalized understanding, returned demonstration, verbal cues required, tactile cues required, and needs further education  HOME EXERCISE PROGRAM: Access Code: [email or web address] URL: [email or web address] Date: 06/04/2023 Prepared by: Aaron Edelman  Exercises - Seated Long Arc Quad  - 2 x daily - 7 x weekly - 1-2 sets - 5 reps - Seated Heel Slide  -  2 x daily - 7 x weekly - 1 sets - 3-5 reps  ASSESSMENT:  CLINICAL IMPRESSION: Patient is a 67 y.o. female with a dx of polyneuropathy presenting to the clinic with pain and N/T in bilat distal LE's, muscle weakness in bilat LE's, limited ROM in bilat ankles, and difficulty in walking.  Pt reports having constant numbness in bilat feet.  Pt has gait deficits and is  limited with ambulation distance.  She presents to clinic without AD though uses a cane, FWW, or rollator if she is going out and has to stand for any length of time.  Pt reports she is very limited with standing duration and reports 5 mins max for standing.  Pt is unable to walk up an incline and has to take stairs slow.  Pt has difficulty with household chores and cleaning.  Pt had no active movement in bilat ankles and slight movement in her toes.  Pt should benefit from skilled PT services including aquatic therapy.  OBJECTIVE IMPAIRMENTS: Abnormal gait, decreased activity tolerance, decreased endurance, decreased mobility, difficulty walking, decreased ROM, decreased strength, hypomobility, impaired flexibility, and pain.   ACTIVITY LIMITATIONS: lifting, standing, squatting, stairs, and locomotion level  PARTICIPATION LIMITATIONS: meal prep, cleaning, shopping, and community activity  PERSONAL FACTORS: Time since onset of injury/illness/exacerbation and 3+ comorbidities: HIV, L5-S1 grade 1 anterolisthesis, and depression  are also affecting patient's functional outcome.   REHAB POTENTIAL: Good  CLINICAL DECISION MAKING: Evolving/moderate complexity  EVALUATION COMPLEXITY: Moderate   GOALS:  SHORT TERM GOALS: Target date: 06/25/2023  Pt will report at least a 25% improvement in sx's and mobility.  Baseline: Goal status: INITIAL  2.  Pt will demo active movement in bilat ankle DF, PF, Eve, and Inv for improved strength, mobility, and circulation. Baseline:  Goal status: INITIAL  3.  Pt report she is able to stand at least 8 mins for improved performance of household chores.  Baseline:  Goal status: INITIAL  4.  Pt will tolerate aquatic therapy without adverse effects for improved tolerance to activity, mobility, sx's, and function.  Baseline:  Goal status: INITIAL    LONG TERM GOALS: Target date: 07/16/2023  Pt will demo bilat ankle AROM to be at least 10 deg in DF, 15 deg  in inv, and 8 deg in eversion for improved ROM, stiffness, and gait. Baseline:  Goal status: INITIAL  2.  Pt will demo improved quality of gait including increased heel strike and toe off.  Baseline:  Goal status: INITIAL  3.  Pt will report at least a 50% improvement in ambulation distance and standing tolerance for improved mobility and performance of household chores. Baseline:  Goal status: INITIAL  4.  Pt will report improved ease, decreased pain, and increased standing duration with cooking. Baseline:  Goal status: INITIAL  5.  Pt will be able to tolerate minimal manual resistance t/o bilat ankles for improved stability with gait and performance of functional mobility.  Baseline:  Goal status: INITIAL  6.  Pt will be independent with land and aquatic HEP for improved pain, sx's, function, and tolerance to activity. Baseline:  Goal status: INITIAL   PLAN:  PT FREQUENCY: 1-2x/week  PT DURATION: 6 weeks  PLANNED INTERVENTIONS: Therapeutic exercises, Therapeutic activity, Neuromuscular re-education, Balance training, Gait training, Patient/Family education, Self Care, Joint mobilization, Stair training, DME instructions, Aquatic Therapy, Electrical stimulation, Taping, Manual therapy, and Re-evaluation  PLAN FOR NEXT SESSION: Cont with aquatic therapy  Check all possible CPT codes: 16109 - PT  Re-evaluation, [id number]- Therapeutic Exercise, [phone]- Neuro Re-education, [phone] - Gait Training, [id number] - Manual Therapy, [phone] - Therapeutic Activities, [phone] - Self Care, [phone] - Electrical stimulation (unattended), [phone] - Physical performance training, and [phone] - Aquatic therapy    Check all conditions that are expected to impact treatment: {Conditions expected to impact treatment:Active major medical illness   If treatment provided at initial evaluation, no treatment charged due to lack of authorization.        Audie Clear III PT, DPT 06/04/23 5:25 PM

## 2023-06-04 ENCOUNTER — Ambulatory Visit (HOSPITAL_BASED_OUTPATIENT_CLINIC_OR_DEPARTMENT_OTHER): Payer: BC Managed Care – PPO | Attending: Nurse Practitioner | Admitting: Physical Therapy

## 2023-06-04 ENCOUNTER — Other Ambulatory Visit: Payer: Self-pay

## 2023-06-04 ENCOUNTER — Encounter (HOSPITAL_BASED_OUTPATIENT_CLINIC_OR_DEPARTMENT_OTHER): Payer: Self-pay | Admitting: Physical Therapy

## 2023-06-04 DIAGNOSIS — M6281 Muscle weakness (generalized): Secondary | ICD-10-CM | POA: Diagnosis present

## 2023-06-04 DIAGNOSIS — M25671 Stiffness of right ankle, not elsewhere classified: Secondary | ICD-10-CM | POA: Diagnosis present

## 2023-06-04 DIAGNOSIS — M79661 Pain in right lower leg: Secondary | ICD-10-CM | POA: Diagnosis present

## 2023-06-04 DIAGNOSIS — M25672 Stiffness of left ankle, not elsewhere classified: Secondary | ICD-10-CM | POA: Diagnosis present

## 2023-06-04 DIAGNOSIS — R262 Difficulty in walking, not elsewhere classified: Secondary | ICD-10-CM

## 2023-06-04 DIAGNOSIS — M25571 Pain in right ankle and joints of right foot: Secondary | ICD-10-CM | POA: Insufficient documentation

## 2023-06-04 DIAGNOSIS — M25572 Pain in left ankle and joints of left foot: Secondary | ICD-10-CM | POA: Diagnosis not present

## 2023-06-04 DIAGNOSIS — M79662 Pain in left lower leg: Secondary | ICD-10-CM

## 2023-06-04 DIAGNOSIS — R208 Other disturbances of skin sensation: Secondary | ICD-10-CM | POA: Diagnosis present

## 2023-06-15 NOTE — Therapy (Signed)
OUTPATIENT PHYSICAL THERAPY TREATMENT NOTE   Patient Name: Stephanie Espinoza MRN: [id number] DOB:05/02/1956, 67 y.o., female Today's Date: 06/16/2023  END OF SESSION:  PT End of Session - 06/16/23 1018     Visit Number 2    Number of Visits 6    Date for PT Re-Evaluation 07/16/23    PT Start Time 0935    PT Stop Time 1009    PT Time Calculation (min) 34 min    Activity Tolerance Patient tolerated treatment well;No increased pain    Behavior During Therapy WFL for tasks assessed/performed              Past Medical History:  Diagnosis Date   Abnormal Pap smear of cervix before 1995   always a repeat back to normal except for 1 time and had colpo bioosy -resutls were normal   Allergic rhinitis    Anxiety    Bell's palsy after 1995 ?   no residual SE.   Depression    Genital herpes    History of drug abuse (HCC)    drug usser 1984 - 1990.  cocaine   HIV (human immunodeficiency virus infection) (HCC) 1990   from sexual contact who is unknown   Syphilis    treated   Past Surgical History:  Procedure Laterality Date   CESAREAN SECTION  1997   ESOPHAGOGASTRODUODENOSCOPY (EGD) WITH PROPOFOL N/A 03/02/2019   Procedure: ESOPHAGOGASTRODUODENOSCOPY (EGD) WITH PROPOFOL;  Surgeon: Willis Modena, MD;  Location: [facility];  Service: Endoscopy;  Laterality: N/A;   EUS N/A 03/02/2019   Procedure: FULL UPPER ENDOSCOPIC ULTRASOUND (EUS) RADIAL;  Surgeon: Willis Modena, MD;  Location: [facility];  Service: Endoscopy;  Laterality: N/A;   LAPAROSCOPIC CHOLECYSTECTOMY SINGLE PORT N/A 03/01/2019   Procedure: LAPAROSCOPIC CHOLECYSTECTOMY SINGLE SITE/ INTRAOPERATIVE CHOLANGIOGRAM;  Surgeon: Karie Soda, MD;  Location: [facility];  Service: General;  Laterality: N/A;   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)  10/22/2022   Right foot pain 05/22/2022   Right lumbar radiculopathy 05/08/2022   Abnormal CXR 02/15/2022   DDD (degenerative disc disease), lumbar 02/12/2022   Anterolisthesis of lumbosacral spine 02/12/2022   Allergic reaction to bee sting 07/27/2021   Weakness of both legs 03/07/2021   B12 deficiency 03/07/2021   Urinary incontinence 05/16/2020   Other fatigue 05/16/2020   History of syphilis 05/14/2020   Memory loss 02/14/2020   Insomnia 02/14/2020   Herpes zoster 12/31/2019   Normocytic anemia 03/01/2019   Hyperglycemia 03/01/2019   Headache 06/30/2018   Urinary frequency 05/01/2018   Cervical 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 (HPV) DNA test positive 10/18/2016   Essential hypertension 09/16/2014   Vasomotor rhinitis 05/05/2014   Benign paroxysmal positional vertigo 09/21/2012   Sinusitis 03/22/2009   Human immunodeficiency virus (HIV) disease (HCC) 09/06/2006   GENITAL HERPES 09/06/2006   Depression 09/06/2006     REFERRING PROVIDER: Janeece Riggers, FNP  REFERRING DIAG: G63  Polyneuropathy in diseases classified elsewhere  THERAPY DIAG:  Other disturbances of skin sensation  Muscle weakness (generalized)  Stiffness of right ankle, not elsewhere classified  Stiffness of left ankle, not elsewhere classified  Pain in right lower leg  Pain in left lower leg  Difficulty in walking, not elsewhere classified  Rationale for Evaluation and Treatment: Rehabilitation  ONSET DATE: PT order 04/16/23 / Exacerbation July 2023  SUBJECTIVE:   SUBJECTIVE STATEMENT:  Pt denies any adverse effects after prior rx.  Pt states she has been performing her HEP and feels better after performing HEP.  Pt reports improved mobility with LE's.    Pt is limited with ambulation distance and is unable to ambulate up an incline.  Pt is very limited with standing duration.  Pt has to take stairs slow.  Pt has difficulty with cooking and breaks up her cleaning.  Pt limits her lifting.    PERTINENT HISTORY: HIV Pt states she has urinary/urge incontinence at times though can control it by what she drinks.  L5-S1 grade 1 anterolisthesis and lumbar DDD Pt reports having concentration and memory issues.  Hx of syphilis and B12 deficiency H/A, HTN, and depression BPPV--Pt states it's more seasonal with allergies.   PAIN:  Pt states the pain has been weird.  It can shoot up from her toes to her leg.  Her pain is worst with sitting still and feels better with movement.  Pain can be stabbing and dull.   5/10 pain in bilat dorsal surface of feet.  Pt reports having tingling in bilat feet.    PRECAUTIONS: Other: HIV, L5-S1 grade 1 anterolisthesis   WEIGHT BEARING RESTRICTIONS: No  FALLS:  Has patient fallen in last 6 months? No  LIVING ENVIRONMENT: Lives with: lives alone Lives in: 1 story home Stairs: 5 steps to enter home with bilat rails Has following equipment at home: Dan Humphreys - 2 wheeled and rollator, quad cane  OCCUPATION: Pt is currently on disability.  PLOF: Independent  PATIENT GOALS: improve strength in ankles, improve sensation, decrease pain   OBJECTIVE:   DIAGNOSTIC FINDINGS:  EMG in 2023 showed peripheral neuropathy, no evidence for lumbosacaral radic or myelopathy in the R LE per MD notes  Lumbar MRI 2023:  facet hypertrophy at L5-S1 with resulting grade 1 anterolisthesis, annular disc bulging and at least moderate L > R NF narrowing, there is possible chronic encroachment on either L5 nn roots.  No other significant stenosis or nn root encroachment.  Low lying conus again noted without evidence of cord tethering.     TODAY'S TREATMENT:                                                                                                                                Pt seen for aquatic therapy today.  Treatment took place in water 3.5 ft depth at the [facility] pool. Temp of water was 91.  Pt entered/exited the pool via stairs  independently with bilat rail.   Reviewed response to prior Rx, HEP compliance, pain level, and current function. Introduction to water.    Seated on aquatic bench:  LAQ x 10 bilat  Hip flexion x 10 bilat  AP's 2x10 bilat  Toe raises x 10 and x 5 bilat  Heel raises x 10 and x 5 bilat  Ankle circles x 10 bilat  Pt  ambulated 2 laps with yellow noodle              Standing exercises while holding on to the pool wall:     -marching 10 reps     -Hip abd x 10 reps bilat     -mini squats x 10 reps     -Sidestepping    Pt requires buoyancy for support and to offload joints with strengthening exercises. Viscosity of the water is needed for resistance of strengthening; water current perturbations provides challenge to standing balance unsupported, requiring increased core activation.    PATIENT EDUCATION:  Education details: aquatic properties, purpose, and benefits.  HEP, POC exercise form, exercise rationale, and relevant anatomy.   Person educated: Patient Education method: Explanation, Demonstration, Verbal cues Education comprehension: verbalized understanding, returned demonstration, verbal cues required, and needs further education  HOME EXERCISE PROGRAM: Access Code: [email or web address] URL: [email or web address] Date: 06/04/2023 Prepared by: Aaron Edelman  Exercises - Seated Long Arc Quad  - 2 x daily - 7 x weekly - 1-2 sets - 5 reps - Seated Heel Slide  - 2 x daily - 7 x weekly - 1 sets - 3-5 reps  ASSESSMENT:  CLINICAL IMPRESSION: Pt presents to PT for an aquatic appt.  She enters clinic with a cane today.  Pt is compliant with HEP.  Pt states she feels comfortable in the water.  Pt is able to perform active DF and PF in the pool in open chain and closed chain (seated) which she was unable to perform on land.  Pt states it's the first time she has been able to move her foot in a long time.  Pt performed exercises well with instruction from PT.  Pt ambulated with yellow  noodle slowly in the pool and was stable with yellow noodle.  She responded well to Rx reporting improved pain from 5/10 before Rx to 3/10 after Rx.  Pt is a good candidate for aquatic therapy and should benefit from continued PT.   OBJECTIVE IMPAIRMENTS: Abnormal gait, decreased activity tolerance, decreased endurance, decreased mobility, difficulty walking, decreased ROM, decreased strength, hypomobility, impaired flexibility, and pain.   ACTIVITY LIMITATIONS: lifting, standing, squatting, stairs, and locomotion level  PARTICIPATION LIMITATIONS: meal prep, cleaning, shopping, and community activity  PERSONAL FACTORS: Time since onset of injury/illness/exacerbation and 3+ comorbidities: HIV, L5-S1 grade 1 anterolisthesis, and depression  are also affecting patient's functional outcome.   REHAB POTENTIAL: Good  CLINICAL DECISION MAKING: Evolving/moderate complexity  EVALUATION COMPLEXITY: Moderate   GOALS:  SHORT TERM GOALS: Target date: 06/25/2023  Pt will report at least a 25% improvement in sx's and mobility.  Baseline: Goal status: INITIAL  2.  Pt will demo active movement in bilat ankle DF, PF, Eve, and Inv for improved strength, mobility, and circulation. Baseline:  Goal status: INITIAL  3.  Pt report she is able to stand at least 8 mins for improved performance of household chores.  Baseline:  Goal status: INITIAL  4.  Pt will tolerate aquatic therapy without adverse effects for improved tolerance to activity, mobility, sx's, and function.  Baseline:  Goal status: INITIAL    LONG TERM GOALS: Target date: 07/16/2023  Pt will demo bilat ankle AROM to be at least 10 deg in DF, 15 deg in inv, and 8 deg in eversion for improved ROM, stiffness, and gait. Baseline:  Goal status: INITIAL  2.  Pt will demo improved quality of gait including increased heel strike and toe off.  Baseline:  Goal status: INITIAL  3.  Pt will report at least a 50% improvement in ambulation  distance and standing tolerance for improved mobility and performance of household chores. Baseline:  Goal status: INITIAL  4.  Pt will report improved ease, decreased pain, and increased standing duration with cooking. Baseline:  Goal status: INITIAL  5.  Pt will be able to tolerate minimal manual resistance t/o bilat ankles for improved stability with gait and performance of functional mobility.  Baseline:  Goal status: INITIAL  6.  Pt will be independent with land and aquatic HEP for improved pain, sx's, function, and tolerance to activity. Baseline:  Goal status: INITIAL   PLAN:  PT FREQUENCY: 1-2x/week  PT DURATION: 6 weeks  PLANNED INTERVENTIONS: Therapeutic exercises, Therapeutic activity, Neuromuscular re-education, Balance training, Gait training, Patient/Family education, Self Care, Joint mobilization, Stair training, DME instructions, Aquatic Therapy, Electrical stimulation, Taping, Manual therapy, and Re-evaluation  PLAN FOR NEXT SESSION: Cont with aquatic therapy     Audie Clear III PT, DPT 06/16/23 3:36 PM

## 2023-06-16 ENCOUNTER — Encounter (HOSPITAL_BASED_OUTPATIENT_CLINIC_OR_DEPARTMENT_OTHER): Payer: Self-pay | Admitting: Physical Therapy

## 2023-06-16 ENCOUNTER — Ambulatory Visit (HOSPITAL_BASED_OUTPATIENT_CLINIC_OR_DEPARTMENT_OTHER): Payer: BC Managed Care – PPO | Attending: Nurse Practitioner | Admitting: Physical Therapy

## 2023-06-16 DIAGNOSIS — M6281 Muscle weakness (generalized): Secondary | ICD-10-CM | POA: Diagnosis present

## 2023-06-16 DIAGNOSIS — M25672 Stiffness of left ankle, not elsewhere classified: Secondary | ICD-10-CM | POA: Diagnosis present

## 2023-06-16 DIAGNOSIS — M79661 Pain in right lower leg: Secondary | ICD-10-CM | POA: Diagnosis present

## 2023-06-16 DIAGNOSIS — M79662 Pain in left lower leg: Secondary | ICD-10-CM | POA: Insufficient documentation

## 2023-06-16 DIAGNOSIS — M25671 Stiffness of right ankle, not elsewhere classified: Secondary | ICD-10-CM | POA: Insufficient documentation

## 2023-06-16 DIAGNOSIS — R262 Difficulty in walking, not elsewhere classified: Secondary | ICD-10-CM | POA: Insufficient documentation

## 2023-06-16 DIAGNOSIS — R208 Other disturbances of skin sensation: Secondary | ICD-10-CM | POA: Diagnosis present

## 2023-06-18 DIAGNOSIS — B20 Human immunodeficiency virus [HIV] disease: Secondary | ICD-10-CM | POA: Diagnosis not present

## 2023-06-18 DIAGNOSIS — M79671 Pain in right foot: Secondary | ICD-10-CM | POA: Diagnosis not present

## 2023-06-18 DIAGNOSIS — M79672 Pain in left foot: Secondary | ICD-10-CM | POA: Diagnosis not present

## 2023-06-18 DIAGNOSIS — Z79891 Long term (current) use of opiate analgesic: Secondary | ICD-10-CM | POA: Diagnosis not present

## 2023-06-18 DIAGNOSIS — G8929 Other chronic pain: Secondary | ICD-10-CM | POA: Diagnosis not present

## 2023-06-23 ENCOUNTER — Ambulatory Visit (HOSPITAL_BASED_OUTPATIENT_CLINIC_OR_DEPARTMENT_OTHER): Payer: BC Managed Care – PPO | Admitting: Physical Therapy

## 2023-06-23 DIAGNOSIS — M6281 Muscle weakness (generalized): Secondary | ICD-10-CM

## 2023-06-23 DIAGNOSIS — M79661 Pain in right lower leg: Secondary | ICD-10-CM

## 2023-06-23 DIAGNOSIS — M25671 Stiffness of right ankle, not elsewhere classified: Secondary | ICD-10-CM

## 2023-06-23 DIAGNOSIS — R208 Other disturbances of skin sensation: Secondary | ICD-10-CM | POA: Diagnosis not present

## 2023-06-23 DIAGNOSIS — M79662 Pain in left lower leg: Secondary | ICD-10-CM

## 2023-06-23 DIAGNOSIS — R262 Difficulty in walking, not elsewhere classified: Secondary | ICD-10-CM

## 2023-06-23 DIAGNOSIS — M25672 Stiffness of left ankle, not elsewhere classified: Secondary | ICD-10-CM

## 2023-06-23 NOTE — Therapy (Signed)
OUTPATIENT PHYSICAL THERAPY TREATMENT NOTE   Patient Name: Stephanie Espinoza MRN: [id number] DOB:01/28/1956, 67 y.o., female Today's Date: 06/24/2023  END OF SESSION:  PT End of Session - 06/23/23 1020     Visit Number 3    Number of Visits 6    Date for PT Re-Evaluation 07/16/23    Authorization Type [employer] and [employer]    PT Start Time [phone]    PT Stop Time 1018    PT Time Calculation (min) 39 min    Activity Tolerance Patient tolerated treatment well;No increased pain    Behavior During Therapy WFL for tasks assessed/performed               Past Medical History:  Diagnosis Date   Abnormal Pap smear of cervix before 1995   always a repeat back to normal except for 1 time and had colpo bioosy -resutls were normal   Allergic rhinitis    Anxiety    Bell's palsy after 1995 ?   no residual SE.   Depression    Genital herpes    History of drug abuse (HCC)    drug usser 1984 - 1990.  cocaine   HIV (human immunodeficiency virus infection) (HCC) 1990   from sexual contact who is unknown   Syphilis    treated   Past Surgical History:  Procedure Laterality Date   CESAREAN SECTION  1997   ESOPHAGOGASTRODUODENOSCOPY (EGD) WITH PROPOFOL N/A 03/02/2019   Procedure: ESOPHAGOGASTRODUODENOSCOPY (EGD) WITH PROPOFOL;  Surgeon: Willis Modena, MD;  Location: [facility];  Service: Endoscopy;  Laterality: N/A;   EUS N/A 03/02/2019   Procedure: FULL UPPER ENDOSCOPIC ULTRASOUND (EUS) RADIAL;  Surgeon: Willis Modena, MD;  Location: [facility];  Service: Endoscopy;  Laterality: N/A;   LAPAROSCOPIC CHOLECYSTECTOMY SINGLE PORT N/A 03/01/2019   Procedure: LAPAROSCOPIC CHOLECYSTECTOMY SINGLE SITE/ INTRAOPERATIVE CHOLANGIOGRAM;  Surgeon: Karie Soda, MD;  Location: [facility];  Service: General;  Laterality: N/A;   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   Encounter for general adult medical examination with abnormal  findings 11/25/2022   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/22/2022   Right foot pain 05/22/2022   Right lumbar radiculopathy 05/08/2022   Abnormal CXR 02/15/2022   DDD (degenerative disc disease), lumbar 02/12/2022   Anterolisthesis of lumbosacral spine 02/12/2022   Allergic reaction to bee sting 07/27/2021   Weakness of both legs 03/07/2021   B12 deficiency 03/07/2021   Urinary incontinence 05/16/2020   Other fatigue 05/16/2020   History of syphilis 05/14/2020   Memory loss 02/14/2020   Insomnia 02/14/2020   Herpes zoster 12/31/2019   Normocytic anemia 03/01/2019   Hyperglycemia 03/01/2019   Headache 06/30/2018   Urinary frequency 05/01/2018   Cervical 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 (HPV) DNA test positive 10/18/2016   Essential hypertension 09/16/2014   Vasomotor rhinitis 05/05/2014   Benign paroxysmal positional vertigo 09/21/2012   Sinusitis 03/22/2009   Human immunodeficiency virus (HIV) disease (HCC) 09/06/2006   GENITAL HERPES 09/06/2006   Depression 09/06/2006     REFERRING PROVIDER: Janeece Riggers, FNP  REFERRING DIAG: G63  Polyneuropathy in diseases classified elsewhere  THERAPY DIAG:  Other disturbances of skin sensation  Muscle weakness (generalized)  Stiffness of right ankle, not elsewhere classified  Stiffness of left ankle, not elsewhere classified  Pain in right lower leg  Pain in left lower leg  Difficulty in walking, not elsewhere classified  Rationale for Evaluation and Treatment:  Rehabilitation  ONSET DATE: PT order 04/16/23 / Exacerbation July 2023  SUBJECTIVE:   SUBJECTIVE STATEMENT:  Pt denies any pain after prior Rx, just some soreness in her calves.  Pt reports she took a nap after prior Rx.  Pt reports compliance with HEP.  Pt reports improved gait.  She states she feels more stable with gait and does not lean as much side to side.  She states she doesn't feel like a "penguin" when she walks.    Pt is limited with ambulation distance  and is unable to ambulate up an incline.  Pt is very limited with standing duration.  Pt has to take stairs slow.  Pt has difficulty with cooking and breaks up her cleaning.   PERTINENT HISTORY: HIV Pt states she has urinary/urge incontinence at times though can control it by what she drinks.  L5-S1 grade 1 anterolisthesis and lumbar DDD Pt reports having concentration and memory issues.  Hx of syphilis and B12 deficiency H/A, HTN, and depression BPPV--Pt states it's more seasonal with allergies.   PAIN:  Pt states her pain is in her toes and travels up her feet and sometimes stays in her toes.  Her pain is worst with sitting still and feels better with movement.  Pain can be stabbing and dull.   3/10 pain in bilat feet.  Pt reports having constant N/T in bilat feet.    PRECAUTIONS: Other: HIV, L5-S1 grade 1 anterolisthesis   WEIGHT BEARING RESTRICTIONS: No  FALLS:  Has patient fallen in last 6 months? No  LIVING ENVIRONMENT: Lives with: lives alone Lives in: 1 story home Stairs: 5 steps to enter home with bilat rails Has following equipment at home: Dan Humphreys - 2 wheeled and rollator, quad cane  OCCUPATION: Pt is currently on disability.  PLOF: Independent  PATIENT GOALS: improve strength in ankles, improve sensation, decrease pain   OBJECTIVE:   DIAGNOSTIC FINDINGS:  EMG in 2023 showed peripheral neuropathy, no evidence for lumbosacaral radic or myelopathy in the R LE per MD notes  Lumbar MRI 2023:  facet hypertrophy at L5-S1 with resulting grade 1 anterolisthesis, annular disc bulging and at least moderate L > R NF narrowing, there is possible chronic encroachment on either L5 nn roots.  No other significant stenosis or nn root encroachment.  Low lying conus again noted without evidence of cord tethering.     TODAY'S TREATMENT:                                                                                                                                Pt seen for  aquatic therapy today.  Treatment took place in water 3.5 - 4 ft depth at the [facility] pool. Temp of water was 91.  Pt entered/exited the pool via stairs independently with bilat rail.   Reviewed response to prior Rx, HEP compliance, pain level, and current function.  Seated on aquatic bench:  LAQ x 15  and x 10 reps bilat  AP's 2x15 bilat  Toe raises/toe raises 2 x 10 bilat  Ankle circles 2 x 10 bilat  Pt ambulated 3 laps with aqua jogger barbell. Pt sidestepped 2 laps with aqua jogger barbell             Standing exercises while holding on to the pool wall:     -marching 2x10 reps     -Hip abd x 10 reps bilat     -mini squats 2 x 10 reps     -heel raises x 10 reps  Pt ambulated 1 lap wth aqua jogger barbell at the end of treatment.        Pt requires buoyancy for support and to offload joints with strengthening exercises. Viscosity of the water is needed for resistance of strengthening; water current perturbations provides challenge to standing balance unsupported, requiring increased core activation.    PATIENT EDUCATION:  Education details: aquatic properties, purpose, and benefits.  HEP, POC exercise form, exercise rationale, and relevant anatomy.   Person educated: Patient Education method: Explanation, Demonstration, Verbal cues Education comprehension: verbalized understanding, returned demonstration, verbal cues required, and needs further education  HOME EXERCISE PROGRAM: Access Code: [email or web address] URL: [email or web address] Date: 06/04/2023 Prepared by: Aaron Edelman  Exercises - Seated Long Arc Quad  - 2 x daily - 7 x weekly - 1-2 sets - 5 reps - Seated Heel Slide  - 2 x daily - 7 x weekly - 1 sets - 3-5 reps  ASSESSMENT:  CLINICAL IMPRESSION: Pt responded well to prior aquatic Rx.  She reports improved gait.  PT increased intensity of aquatic exercises by increasing sets and reps of exercises today and pt tolerated exercises well.  Pt is  able to perform bilat ankle AROM in the pool and is improving with ankle ROM in the pool.  She was able to perform standing heel raises.  Pt had improved gait speed in the pool with the aqua jogger barbell.  Pt also sidestepped across the pool with the aqua jogger barbell.  She performed exercises well with instruction from PT.  She responded well to Rx reporting no increased pain after Rx, just fatigue.  Pt is a good candidate for aquatic therapy and should benefit from continued PT.    OBJECTIVE IMPAIRMENTS: Abnormal gait, decreased activity tolerance, decreased endurance, decreased mobility, difficulty walking, decreased ROM, decreased strength, hypomobility, impaired flexibility, and pain.   ACTIVITY LIMITATIONS: lifting, standing, squatting, stairs, and locomotion level  PARTICIPATION LIMITATIONS: meal prep, cleaning, shopping, and community activity  PERSONAL FACTORS: Time since onset of injury/illness/exacerbation and 3+ comorbidities: HIV, L5-S1 grade 1 anterolisthesis, and depression  are also affecting patient's functional outcome.   REHAB POTENTIAL: Good  CLINICAL DECISION MAKING: Evolving/moderate complexity  EVALUATION COMPLEXITY: Moderate   GOALS:  SHORT TERM GOALS: Target date: 06/25/2023  Pt will report at least a 25% improvement in sx's and mobility.  Baseline: Goal status: INITIAL  2.  Pt will demo active movement in bilat ankle DF, PF, Eve, and Inv for improved strength, mobility, and circulation. Baseline:  Goal status: INITIAL  3.  Pt report she is able to stand at least 8 mins for improved performance of household chores.  Baseline:  Goal status: INITIAL  4.  Pt will tolerate aquatic therapy without adverse effects for improved tolerance to activity, mobility, sx's, and function.  Baseline:  Goal status: INITIAL    LONG TERM GOALS: Target date: 07/16/2023  Pt will demo bilat ankle AROM to be  at least 10 deg in DF, 15 deg in inv, and 8 deg in eversion for  improved ROM, stiffness, and gait. Baseline:  Goal status: INITIAL  2.  Pt will demo improved quality of gait including increased heel strike and toe off.  Baseline:  Goal status: INITIAL  3.  Pt will report at least a 50% improvement in ambulation distance and standing tolerance for improved mobility and performance of household chores. Baseline:  Goal status: INITIAL  4.  Pt will report improved ease, decreased pain, and increased standing duration with cooking. Baseline:  Goal status: INITIAL  5.  Pt will be able to tolerate minimal manual resistance t/o bilat ankles for improved stability with gait and performance of functional mobility.  Baseline:  Goal status: INITIAL  6.  Pt will be independent with land and aquatic HEP for improved pain, sx's, function, and tolerance to activity. Baseline:  Goal status: INITIAL   PLAN:  PT FREQUENCY: 1-2x/week  PT DURATION: 6 weeks  PLANNED INTERVENTIONS: Therapeutic exercises, Therapeutic activity, Neuromuscular re-education, Balance training, Gait training, Patient/Family education, Self Care, Joint mobilization, Stair training, DME instructions, Aquatic Therapy, Electrical stimulation, Taping, Manual therapy, and Re-evaluation  PLAN FOR NEXT SESSION: Cont with aquatic therapy     Audie Clear III PT, DPT 06/24/23 2:00 PM

## 2023-06-24 ENCOUNTER — Encounter (HOSPITAL_BASED_OUTPATIENT_CLINIC_OR_DEPARTMENT_OTHER): Payer: Self-pay | Admitting: Physical Therapy

## 2023-07-02 ENCOUNTER — Encounter (HOSPITAL_BASED_OUTPATIENT_CLINIC_OR_DEPARTMENT_OTHER): Payer: Self-pay | Admitting: Physical Therapy

## 2023-07-02 ENCOUNTER — Ambulatory Visit (HOSPITAL_BASED_OUTPATIENT_CLINIC_OR_DEPARTMENT_OTHER): Payer: BC Managed Care – PPO | Admitting: Physical Therapy

## 2023-07-02 DIAGNOSIS — M79661 Pain in right lower leg: Secondary | ICD-10-CM

## 2023-07-02 DIAGNOSIS — M25672 Stiffness of left ankle, not elsewhere classified: Secondary | ICD-10-CM

## 2023-07-02 DIAGNOSIS — M25671 Stiffness of right ankle, not elsewhere classified: Secondary | ICD-10-CM

## 2023-07-02 DIAGNOSIS — M6281 Muscle weakness (generalized): Secondary | ICD-10-CM

## 2023-07-02 DIAGNOSIS — R208 Other disturbances of skin sensation: Secondary | ICD-10-CM | POA: Diagnosis not present

## 2023-07-02 DIAGNOSIS — M79662 Pain in left lower leg: Secondary | ICD-10-CM

## 2023-07-02 NOTE — Therapy (Signed)
OUTPATIENT PHYSICAL THERAPY TREATMENT NOTE   Patient Name: Stephanie Espinoza MRN: [id number] DOB:December 22, 1955, 67 y.o., female Today's Date: 07/02/2023  END OF SESSION:  PT End of Session - 07/02/23 0950     Visit Number 4    Number of Visits 6    Date for PT Re-Evaluation 07/16/23    Authorization Type [employer] and [employer]    PT Start Time [id number]    PT Stop Time 1025    PT Time Calculation (min) 38 min    Behavior During Therapy WFL for tasks assessed/performed               Past Medical History:  Diagnosis Date   Abnormal Pap smear of cervix before 1995   always a repeat back to normal except for 1 time and had colpo bioosy -resutls were normal   Allergic rhinitis    Anxiety    Bell's palsy after 1995 ?   no residual SE.   Depression    Genital herpes    History of drug abuse (HCC)    drug usser 1984 - 1990.  cocaine   HIV (human immunodeficiency virus infection) (HCC) 1990   from sexual contact who is unknown   Syphilis    treated   Past Surgical History:  Procedure Laterality Date   CESAREAN SECTION  1997   ESOPHAGOGASTRODUODENOSCOPY (EGD) WITH PROPOFOL N/A 03/02/2019   Procedure: ESOPHAGOGASTRODUODENOSCOPY (EGD) WITH PROPOFOL;  Surgeon: Willis Modena, MD;  Location: [facility];  Service: Endoscopy;  Laterality: N/A;   EUS N/A 03/02/2019   Procedure: FULL UPPER ENDOSCOPIC ULTRASOUND (EUS) RADIAL;  Surgeon: Willis Modena, MD;  Location: [facility];  Service: Endoscopy;  Laterality: N/A;   LAPAROSCOPIC CHOLECYSTECTOMY SINGLE PORT N/A 03/01/2019   Procedure: LAPAROSCOPIC CHOLECYSTECTOMY SINGLE SITE/ INTRAOPERATIVE CHOLANGIOGRAM;  Surgeon: Karie Soda, MD;  Location: [facility];  Service: General;  Laterality: N/A;   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   Right foot  pain 05/22/2022   Right lumbar radiculopathy 05/08/2022   Abnormal CXR 02/15/2022   DDD (degenerative disc disease), lumbar 02/12/2022   Anterolisthesis of lumbosacral spine 02/12/2022   Allergic reaction to bee sting 07/27/2021   Weakness of both legs 03/07/2021   B12 deficiency 03/07/2021   Urinary incontinence 05/16/2020   Other fatigue 05/16/2020   History of syphilis 05/14/2020   Memory loss 02/14/2020   Insomnia 02/14/2020   Herpes zoster 12/31/2019   Normocytic anemia 03/01/2019   Hyperglycemia 03/01/2019   Headache 06/30/2018   Urinary frequency 05/01/2018   Cervical 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 (HPV) DNA test positive 10/18/2016   Essential hypertension 09/16/2014   Vasomotor rhinitis 05/05/2014   Benign paroxysmal positional vertigo 09/21/2012   Sinusitis 03/22/2009   Human immunodeficiency virus (HIV) disease (HCC) 09/06/2006   GENITAL HERPES 09/06/2006   Depression 09/06/2006     REFERRING PROVIDER: Janeece Riggers, FNP  REFERRING DIAG: G63  Polyneuropathy in diseases classified elsewhere  THERAPY DIAG:  Other disturbances of skin sensation  Muscle weakness (generalized)  Stiffness of right ankle, not elsewhere classified  Stiffness of left ankle, not elsewhere classified  Pain in right lower leg  Pain in left lower leg  Rationale for Evaluation and Treatment: Rehabilitation  ONSET DATE: PT order 04/16/23 / Exacerbation July 2023  SUBJECTIVE:   SUBJECTIVE STATEMENT:  Pt reports that she tolerated last session well.  No soreness, "I just slept a lot" afterwards.  " I can stand in the kitchen and fix a meal now".    PERTINENT HISTORY: HIV Pt states she has urinary/urge incontinence at times though can control it by what she drinks.  L5-S1 grade 1 anterolisthesis and lumbar DDD Pt reports having concentration and memory issues.  Hx of syphilis and B12 deficiency H/A, HTN, and depression BPPV--Pt states it's more seasonal with  allergies.   PAIN:  Pain? No - just Numbness and tingling in bilat feet  Rated:  7/10    PRECAUTIONS: Other: HIV, L5-S1 grade 1 anterolisthesis   WEIGHT BEARING RESTRICTIONS: No  FALLS:  Has patient fallen in last 6 months? No  LIVING ENVIRONMENT: Lives with: lives alone Lives in: 1 story home Stairs: 5 steps to enter home with bilat rails Has following equipment at home: Dan Humphreys - 2 wheeled and rollator, quad cane  OCCUPATION: Pt is currently on disability.  PLOF: Independent  PATIENT GOALS: improve strength in ankles, improve sensation, decrease pain   OBJECTIVE:   DIAGNOSTIC FINDINGS:  EMG in 2023 showed peripheral neuropathy, no evidence for lumbosacaral radic or myelopathy in the R LE per MD notes  Lumbar MRI 2023:  facet hypertrophy at L5-S1 with resulting grade 1 anterolisthesis, annular disc bulging and at least moderate L > R NF narrowing, there is possible chronic encroachment on either L5 nn roots.  No other significant stenosis or nn root encroachment.  Low lying conus again noted without evidence of cord tethering.     TODAY'S TREATMENT:                                                                                                                               Pt seen for aquatic therapy today.  Treatment took place in water 3.5 - 4 ft depth at the [facility] pool. Temp of water was 91.  Pt entered/exited the pool via stairs independently with bilat rail.   Unsupported:  walking forward/ backward 1 lap UE on rainbow hand floats: walking forward/ backward 2 laps with cues for toe/heel and heel/ toe pattern ,cue for increased Lt step length backwards UE on rainbow hand floats:  side stepping R/L x 2 laps   Seated on aquatic bench:  LAQ with DF, alternating, x 10 reps bilat  Bicycle LEs  Ankle circles  x 10 bilat  STS with feet on blue step in water with forward arm reach x 5, 2 sets  Clams with feet together Return to walking forward with UE on  yellow hand floats x 2 lap Tandem gait forward/ backward with light UE on wall   Alternating toe taps to 1st step x 5 each LE without UE support     PATIENT EDUCATION:  Education details: aquatic exercise form, exercise rationale Person educated: Patient Education method: Programmer, multimedia, Demonstration, Verbal cues Education comprehension: verbalized understanding, returned demonstration,  verbal cues required, and needs further education  HOME EXERCISE PROGRAM: Access Code: [email or web address] URL: [email or web address] Date: 06/04/2023 Prepared by: Aaron Edelman  Exercises - Seated Long Arc Quad  - 2 x daily - 7 x weekly - 1-2 sets - 5 reps - Seated Heel Slide  - 2 x daily - 7 x weekly - 1 sets - 3-5 reps  ASSESSMENT:  CLINICAL IMPRESSION: Pt required minor cues for gait (step height, length, width), with good improvement. Pt reported some improvement in numbness/tingling in LEs, no increase in pain, and moderate fatigue. She required one short seated rest break during session.She demonstrates less balance in Rt SLS with toe taps.   Pt is a good candidate for aquatic therapy and should benefit from continued PT.  Pt has met STG 4 and is progressing gradually towards the remaining goals. Therapist to assess remaining STG next visit.    OBJECTIVE IMPAIRMENTS: Abnormal gait, decreased activity tolerance, decreased endurance, decreased mobility, difficulty walking, decreased ROM, decreased strength, hypomobility, impaired flexibility, and pain.   ACTIVITY LIMITATIONS: lifting, standing, squatting, stairs, and locomotion level  PARTICIPATION LIMITATIONS: meal prep, cleaning, shopping, and community activity  PERSONAL FACTORS: Time since onset of injury/illness/exacerbation and 3+ comorbidities: HIV, L5-S1 grade 1 anterolisthesis, and depression  are also affecting patient's functional outcome.   REHAB POTENTIAL: Good  CLINICAL DECISION MAKING: Evolving/moderate  complexity  EVALUATION COMPLEXITY: Moderate   GOALS:  SHORT TERM GOALS: Target date: 06/25/2023  Pt will report at least a 25% improvement in sx's and mobility.  Baseline: 20% improvement reported - 07/02/23 Goal status: IN PROGRESS  2.  Pt will demo active movement in bilat ankle DF, PF, Eve, and Inv for improved strength, mobility, and circulation. Baseline:  Goal status: INITIAL  3.  Pt report she is able to stand at least 8 mins for improved performance of household chores.  Baseline:  Goal status: INITIAL  4.  Pt will tolerate aquatic therapy without adverse effects for improved tolerance to activity, mobility, sx's, and function.  Baseline:  Goal status:MET - 07/02/23    LONG TERM GOALS: Target date: 07/16/2023  Pt will demo bilat ankle AROM to be at least 10 deg in DF, 15 deg in inv, and 8 deg in eversion for improved ROM, stiffness, and gait. Baseline:  Goal status: INITIAL  2.  Pt will demo improved quality of gait including increased heel strike and toe off.  Baseline:  Goal status: INITIAL  3.  Pt will report at least a 50% improvement in ambulation distance and standing tolerance for improved mobility and performance of household chores. Baseline:  Goal status: INITIAL  4.  Pt will report improved ease, decreased pain, and increased standing duration with cooking. Baseline:  Goal status: INITIAL  5.  Pt will be able to tolerate minimal manual resistance t/o bilat ankles for improved stability with gait and performance of functional mobility.  Baseline:  Goal status: INITIAL  6.  Pt will be independent with land and aquatic HEP for improved pain, sx's, function, and tolerance to activity. Baseline:  Goal status: INITIAL   PLAN:  PT FREQUENCY: 1-2x/week  PT DURATION: 6 weeks  PLANNED INTERVENTIONS: Therapeutic exercises, Therapeutic activity, Neuromuscular re-education, Balance training, Gait training, Patient/Family education, Self Care, Joint  mobilization, Stair training, DME instructions, Aquatic Therapy, Electrical stimulation, Taping, Manual therapy, and Re-evaluation  PLAN FOR NEXT SESSION: Cont with aquatic therapy  Mayer Camel, PTA 07/02/23 10:29 AM [facility] [address] [place], [place],  [address] Phone: [phone]   Fax:  [phone]

## 2023-07-08 ENCOUNTER — Encounter (HOSPITAL_BASED_OUTPATIENT_CLINIC_OR_DEPARTMENT_OTHER): Payer: Self-pay | Admitting: Physical Therapy

## 2023-07-08 ENCOUNTER — Encounter: Payer: Self-pay | Admitting: Internal Medicine

## 2023-07-08 ENCOUNTER — Ambulatory Visit (INDEPENDENT_AMBULATORY_CARE_PROVIDER_SITE_OTHER): Payer: BC Managed Care – PPO | Admitting: Internal Medicine

## 2023-07-08 ENCOUNTER — Ambulatory Visit (HOSPITAL_BASED_OUTPATIENT_CLINIC_OR_DEPARTMENT_OTHER): Payer: BC Managed Care – PPO | Attending: Nurse Practitioner | Admitting: Physical Therapy

## 2023-07-08 VITALS — BP 160/100 | HR 77 | Temp 98.2°F | Ht 62.0 in | Wt 156.0 lb

## 2023-07-08 DIAGNOSIS — M25672 Stiffness of left ankle, not elsewhere classified: Secondary | ICD-10-CM | POA: Insufficient documentation

## 2023-07-08 DIAGNOSIS — I10 Essential (primary) hypertension: Secondary | ICD-10-CM | POA: Diagnosis not present

## 2023-07-08 DIAGNOSIS — M6281 Muscle weakness (generalized): Secondary | ICD-10-CM | POA: Diagnosis present

## 2023-07-08 DIAGNOSIS — Z23 Encounter for immunization: Secondary | ICD-10-CM

## 2023-07-08 DIAGNOSIS — M25671 Stiffness of right ankle, not elsewhere classified: Secondary | ICD-10-CM | POA: Diagnosis present

## 2023-07-08 DIAGNOSIS — R208 Other disturbances of skin sensation: Secondary | ICD-10-CM | POA: Diagnosis present

## 2023-07-08 MED ORDER — VALSARTAN 40 MG PO TABS: 40.0000 mg | ORAL_TABLET | Freq: Every day | ORAL | 3 refills | Status: DC

## 2023-07-08 MED ORDER — LIDOCAINE 5 % EX PTCH: 1.0000 | MEDICATED_PATCH | CUTANEOUS | 3 refills | Status: AC

## 2023-07-08 NOTE — Progress Notes (Signed)
   Subjective:   Patient ID: Stephanie Espinoza, female    DOB: April 05, 1956, 67 y.o.   MRN: [id number]  Hypertension Pertinent negatives include no chest pain, palpitations or shortness of breath.   The patient is a 67 YO female coming in for high blood pressure. Stopped amlodipine 3 weeks ago due to new hot flashes. She has had improvement in hot flashes since stopping. No new headaches or chest pains.  Review of Systems  Constitutional: Negative.   HENT: Negative.    Eyes: Negative.   Respiratory:  Negative for cough, chest tightness and shortness of breath.   Cardiovascular:  Negative for chest pain, palpitations and leg swelling.  Gastrointestinal:  Negative for abdominal distention, abdominal pain, constipation, diarrhea, nausea and vomiting.  Musculoskeletal:  Positive for arthralgias.  Skin: Negative.   Neurological:  Positive for numbness.  Psychiatric/Behavioral: Negative.      Objective:  Physical Exam Constitutional:      Appearance: She is well-developed.  HENT:     Head: Normocephalic and atraumatic.  Cardiovascular:     Rate and Rhythm: Normal rate and regular rhythm.  Pulmonary:     Effort: Pulmonary effort is normal. No respiratory distress.     Breath sounds: Normal breath sounds. No wheezing or rales.  Abdominal:     General: Bowel sounds are normal. There is no distension.     Palpations: Abdomen is soft.     Tenderness: There is no abdominal tenderness. There is no rebound.  Musculoskeletal:     Cervical back: Normal range of motion.  Skin:    General: Skin is warm and dry.  Neurological:     Mental Status: She is alert and oriented to person, place, and time. Mental status is at baseline.     Sensory: Sensory deficit present.     Coordination: Coordination normal.     Vitals:   07/08/23 0821 07/08/23 0823  BP: (!) 160/100 (!) 160/100  Pulse: 77   Temp: 98.2 F (36.8 C)   TempSrc: Oral   SpO2: 98%   Weight: 156 lb (70.8 kg)   Height: 5\' 2"  (1.575  m)     Assessment & Plan:  Flu shot given at visit

## 2023-07-08 NOTE — Assessment & Plan Note (Signed)
BP is high off medication. Will rx valsartan 40 mg daily. She will need labs in 4-6 weeks which is timed well with upcoming [facility] labs. They have ordered BMP which will be adequate. If any change to Cr or K they can let us know and we will be happy to address.

## 2023-07-08 NOTE — Therapy (Signed)
OUTPATIENT PHYSICAL THERAPY TREATMENT NOTE   Patient Name: Stephanie Espinoza MRN: [id number] DOB:06-11-1956, 67 y.o., female Today's Date: 07/08/2023  END OF SESSION:  PT End of Session - 07/08/23 1139     Visit Number 5    Number of Visits 6    Date for PT Re-Evaluation 07/16/23    Authorization Type [employer] and [employer]    PT Start Time 1032    PT Stop Time 1115    PT Time Calculation (min) 43 min    Activity Tolerance Patient tolerated treatment well    Behavior During Therapy WFL for tasks assessed/performed                Past Medical History:  Diagnosis Date   Abnormal Pap smear of cervix before 1995   always a repeat back to normal except for 1 time and had colpo bioosy -resutls were normal   Allergic rhinitis    Anxiety    Bell's palsy after 1995 ?   no residual SE.   Depression    Genital herpes    History of drug abuse (HCC)    drug usser 1984 - 1990.  cocaine   HIV (human immunodeficiency virus infection) (HCC) 1990   from sexual contact who is unknown   Syphilis    treated   Past Surgical History:  Procedure Laterality Date   CESAREAN SECTION  1997   ESOPHAGOGASTRODUODENOSCOPY (EGD) WITH PROPOFOL N/A 03/02/2019   Procedure: ESOPHAGOGASTRODUODENOSCOPY (EGD) WITH PROPOFOL;  Surgeon: Willis Modena, MD;  Location: [facility];  Service: Endoscopy;  Laterality: N/A;   EUS N/A 03/02/2019   Procedure: FULL UPPER ENDOSCOPIC ULTRASOUND (EUS) RADIAL;  Surgeon: Willis Modena, MD;  Location: [facility];  Service: Endoscopy;  Laterality: N/A;   LAPAROSCOPIC CHOLECYSTECTOMY SINGLE PORT N/A 03/01/2019   Procedure: LAPAROSCOPIC CHOLECYSTECTOMY SINGLE SITE/ INTRAOPERATIVE CHOLANGIOGRAM;  Surgeon: Karie Soda, MD;  Location: [facility];  Service: General;  Laterality: N/A;   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   Encounter for general adult medical examination with abnormal findings 11/25/2022    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/22/2022   Right foot pain 05/22/2022   Right lumbar radiculopathy 05/08/2022   Abnormal CXR 02/15/2022   DDD (degenerative disc disease), lumbar 02/12/2022   Anterolisthesis of lumbosacral spine 02/12/2022   Allergic reaction to bee sting 07/27/2021   Weakness of both legs 03/07/2021   B12 deficiency 03/07/2021   Urinary incontinence 05/16/2020   Other fatigue 05/16/2020   History of syphilis 05/14/2020   Memory loss 02/14/2020   Insomnia 02/14/2020   Herpes zoster 12/31/2019   Normocytic anemia 03/01/2019   Hyperglycemia 03/01/2019   Headache 06/30/2018   Urinary frequency 05/01/2018   Cervical 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 (HPV) DNA test positive 10/18/2016   Essential hypertension 09/16/2014   Vasomotor rhinitis 05/05/2014   Benign paroxysmal positional vertigo 09/21/2012   Sinusitis 03/22/2009   Human immunodeficiency virus (HIV) disease (HCC) 09/06/2006   GENITAL HERPES 09/06/2006   Depression 09/06/2006     REFERRING PROVIDER: Janeece Riggers, FNP  REFERRING DIAG: G63  Polyneuropathy in diseases classified elsewhere  THERAPY DIAG:  Other disturbances of skin sensation  Muscle weakness (generalized)  Stiffness of right ankle, not elsewhere classified  Rationale for Evaluation and Treatment: Rehabilitation  ONSET DATE: PT order 04/16/23 / Exacerbation July 2023  SUBJECTIVE:   SUBJECTIVE STATEMENT:  Pt reports that she tolerated last session well.  No  soreness, "I just slept a lot" afterwards.  " I can stand in the kitchen and fix a meal now".    PERTINENT HISTORY: HIV Pt states she has urinary/urge incontinence at times though can control it by what she drinks.  L5-S1 grade 1 anterolisthesis and lumbar DDD Pt reports having concentration and memory issues.  Hx of syphilis and B12 deficiency H/A, HTN, and depression BPPV--Pt states it's more seasonal with allergies.   PAIN:  Pain? No - just Numbness and tingling  in bilat feet  Rated:  7/10    PRECAUTIONS: Other: HIV, L5-S1 grade 1 anterolisthesis   WEIGHT BEARING RESTRICTIONS: No  FALLS:  Has patient fallen in last 6 months? No  LIVING ENVIRONMENT: Lives with: lives alone Lives in: 1 story home Stairs: 5 steps to enter home with bilat rails Has following equipment at home: Dan Humphreys - 2 wheeled and rollator, quad cane  OCCUPATION: Pt is currently on disability.  PLOF: Independent  PATIENT GOALS: improve strength in ankles, improve sensation, decrease pain   OBJECTIVE:   DIAGNOSTIC FINDINGS:  EMG in 2023 showed peripheral neuropathy, no evidence for lumbosacaral radic or myelopathy in the R LE per MD notes  Lumbar MRI 2023:  facet hypertrophy at L5-S1 with resulting grade 1 anterolisthesis, annular disc bulging and at least moderate L > R NF narrowing, there is possible chronic encroachment on either L5 nn roots.  No other significant stenosis or nn root encroachment.  Low lying conus again noted without evidence of cord tethering.     TODAY'S TREATMENT:                                                                                                                               Pt seen for aquatic therapy today.  Treatment took place in water 3.5 - 4 ft depth at the [facility] pool. Temp of water was 91.  Pt entered/exited the pool via stairs independently with bilat rail.   Unsupported:  walking forward/ backward  UE on rainbow hand buoys HB carry: walking forward/ backward 2 laps with cues for toe/heel and heel/ toe pattern ,cue for increased Lt step length backwards UE on rainbow hand floats:  side stepping R/L x 2 laps *side lunges,  ue add/abd x 3 widths  *Seated on lift: cycling, hip add/abd; LAQ with DF alternation x 20-25 *STS from bench onto water step with decreasing feet width x10  *add sets using orange BB *STS with add set x 5 onto water step *Alternating toe taps to 1st step x 5 each LE without UE support   *Walking up to then over water step multiple reps (curb simulating) *Tandem gait forward/ backward  4.69ft then 3.6 ue support yellow HB *Trial of tandem stance then SLS to assess ability.  Ue support HB.  (Difficult)     PATIENT EDUCATION:  Education details: aquatic exercise form, exercise rationale Person educated: Patient Education method: Programmer, multimedia, Demonstration, Verbal cues  Education comprehension: verbalized understanding, returned demonstration, verbal cues required, and needs further education  HOME EXERCISE PROGRAM: Access Code: [email or web address] URL: [email or web address] Date: 06/04/2023 Prepared by: Aaron Edelman  Exercises - Seated Long Arc Quad  - 2 x daily - 7 x weekly - 1-2 sets - 5 reps - Seated Heel Slide  - 2 x daily - 7 x weekly - 1 sets - 3-5 reps   Aquatic This aquatic home exercise program from [employer] utilizes pictures from land based exercises, but has been adapted prior to lamination and issuance.   Access Code: [email or web address] URL: [email or web address] Date: 07/08/2023 Prepared by: Geni Bers  Exercises - Hand Buoy Carry  - 1 x daily - 7 x weekly - 3 sets - 10 reps - Standing Toe Taps  - 1 x daily - 7 x weekly - 3 sets - 10 reps - Forward Backward Tandem Walking  - 1 x daily - 7 x weekly - 3 sets - 10 reps - Flutter Kicking/Windshield Wipers  - 1 x daily - 7 x weekly - 3 sets - 10 reps - Sit to Stand  - 1 x daily - 7 x weekly - 3 sets - 10 reps - Side Stepping  - 1 x daily - 7 x weekly - 3 sets - 10 reps - Braided Sidestepping with Arms Out  - 1 x daily - 7 x weekly - 3 sets - 10 reps  ASSESSMENT:  CLINICAL IMPRESSION: Pt reports newly on gabapentin with complete subsiding of pain and tingling in shin to feet.  Numbness does persist.  She report some fatigue/tiredness after aquatic session but no pain. She feels aquatic intervention is improving her strength and mobility. She is now able to stand up to 10 minutes to  cook/do dishes.  She is instructed on increasing her amb distance by using grocery carts and walking through store as able up to 2 x week.  She is issued list of pools in area with encouragement to gain membership for aquatic HEP.  Her balance is challenged well in setting without fear of falling due to properties of water and she is able to tolerate strengthening.  Added simulated curb stepping  which is challenging for her.  She completes with verbal cueing. Moving towards meeting all goals. Re-cert vs DC.  Aquatic HEP to be issued. Began creating as noted above.     OBJECTIVE IMPAIRMENTS: Abnormal gait, decreased activity tolerance, decreased endurance, decreased mobility, difficulty walking, decreased ROM, decreased strength, hypomobility, impaired flexibility, and pain.   ACTIVITY LIMITATIONS: lifting, standing, squatting, stairs, and locomotion level  PARTICIPATION LIMITATIONS: meal prep, cleaning, shopping, and community activity  PERSONAL FACTORS: Time since onset of injury/illness/exacerbation and 3+ comorbidities: HIV, L5-S1 grade 1 anterolisthesis, and depression  are also affecting patient's functional outcome.   REHAB POTENTIAL: Good  CLINICAL DECISION MAKING: Evolving/moderate complexity  EVALUATION COMPLEXITY: Moderate   GOALS:  SHORT TERM GOALS: Target date: 06/25/2023  Pt will report at least a 25% improvement in sx's and mobility.  Baseline: 20% improvement reported - 07/02/23 Goal status: IN PROGRESS  2.  Pt will demo active movement in bilat ankle DF, PF, Eve, and Inv for improved strength, mobility, and circulation. Baseline:  Goal status: INITIAL  3.  Pt report she is able to stand at least 8 mins for improved performance of household chores.  Baseline:  Goal status: Met 07/08/23  4.  Pt will tolerate aquatic therapy without adverse effects for improved tolerance to activity, mobility, sx's, and function.  Baseline:  Goal status:MET - 07/02/23    LONG TERM  GOALS: Target date: 07/16/2023  Pt will demo bilat ankle AROM to be at least 10 deg in DF, 15 deg in inv, and 8 deg in eversion for improved ROM, stiffness, and gait. Baseline:  Goal status: INITIAL  2.  Pt will demo improved quality of gait including increased heel strike and toe off.  Baseline:  Goal status: INITIAL  3.  Pt will report at least a 50% improvement in ambulation distance and standing tolerance for improved mobility and performance of household chores. Baseline:  Goal status: INITIAL  4.  Pt will report improved ease, decreased pain, and increased standing duration with cooking. Baseline:  Goal status: INITIAL  5.  Pt will be able to tolerate minimal manual resistance t/o bilat ankles for improved stability with gait and performance of functional mobility.  Baseline:  Goal status: INITIAL  6.  Pt will be independent with land and aquatic HEP for improved pain, sx's, function, and tolerance to activity. Baseline:  Goal status: INITIAL   PLAN:  PT FREQUENCY: 1-2x/week  PT DURATION: 6 weeks  PLANNED INTERVENTIONS: Therapeutic exercises, Therapeutic activity, Neuromuscular re-education, Balance training, Gait training, Patient/Family education, Self Care, Joint mobilization, Stair training, DME instructions, Aquatic Therapy, Electrical stimulation, Taping, Manual therapy, and Re-evaluation  PLAN FOR NEXT SESSION: Cont with aquatic therapy  Rushie Chestnut) Ileane Sando MPT 07/08/23 11:39 AM [facility] [address] [place], [place], [address] Phone: [phone]   Fax:  [phone]

## 2023-07-08 NOTE — Patient Instructions (Signed)
We will start valsartan for blood pressure to take 1 pill daily. It can be morning or night per your preference.

## 2023-07-09 ENCOUNTER — Ambulatory Visit: Payer: BC Managed Care – PPO | Admitting: Internal Medicine

## 2023-07-14 ENCOUNTER — Telehealth: Payer: Self-pay

## 2023-07-14 ENCOUNTER — Encounter (HOSPITAL_BASED_OUTPATIENT_CLINIC_OR_DEPARTMENT_OTHER): Payer: Self-pay | Admitting: Physical Therapy

## 2023-07-14 ENCOUNTER — Ambulatory Visit (HOSPITAL_BASED_OUTPATIENT_CLINIC_OR_DEPARTMENT_OTHER): Payer: BC Managed Care – PPO | Admitting: Physical Therapy

## 2023-07-14 ENCOUNTER — Other Ambulatory Visit (HOSPITAL_COMMUNITY): Payer: Self-pay

## 2023-07-14 DIAGNOSIS — M25672 Stiffness of left ankle, not elsewhere classified: Secondary | ICD-10-CM

## 2023-07-14 DIAGNOSIS — M6281 Muscle weakness (generalized): Secondary | ICD-10-CM

## 2023-07-14 DIAGNOSIS — M25671 Stiffness of right ankle, not elsewhere classified: Secondary | ICD-10-CM

## 2023-07-14 DIAGNOSIS — R208 Other disturbances of skin sensation: Secondary | ICD-10-CM | POA: Diagnosis not present

## 2023-07-14 NOTE — Telephone Encounter (Signed)
Pharmacy Patient Advocate Encounter  Received notification from [facility] that Prior Authorization for Lidocaine 5% patches has been APPROVED from 07-14-2023 to 07-13-2026   PA #/Case ID/Reference #: [id number]

## 2023-07-14 NOTE — Telephone Encounter (Signed)
Pharmacy Patient Advocate Encounter   Received notification from CoverMyMeds that prior authorization for Liodcaine 5% patches is required/requested.   Insurance verification completed.   The patient is insured through [facility] .   Per test claim: PA required; PA submitted to [facility] via CoverMyMeds Key/confirmation #/EOC Key: [id number]       Status is pending

## 2023-07-14 NOTE — Therapy (Signed)
OUTPATIENT PHYSICAL THERAPY TREATMENT NOTE/DC PHYSICAL THERAPY DISCHARGE SUMMARY  Visits from Start of Care: 6  Current functional level related to goals / functional outcomes: Pt is as safe and indep in home environment and with ADL's as able using AD   Remaining deficits: Neuropathy   Education / Equipment: Management of condition/HEP's   Patient agrees to discharge. Patient goals were partially met. Patient is being discharged due to maximized rehab potential.     Patient Name: Stephanie Espinoza MRN: [id number] DOB:1956-10-06, 67 y.o., female Today's Date: 07/14/2023  END OF SESSION:  PT End of Session - 07/14/23 1103     Visit Number 6    Number of Visits 6    Date for PT Re-Evaluation 07/16/23    Authorization Type BCBS and [employer] MCD    PT Start Time [phone]    PT Stop Time 1030    PT Time Calculation (min) 44 min    Activity Tolerance Patient tolerated treatment well    Behavior During Therapy WFL for tasks assessed/performed                 Past Medical History:  Diagnosis Date   Abnormal Pap smear of cervix before 1995   always a repeat back to normal except for 1 time and had colpo bioosy -resutls were normal   Allergic rhinitis    Anxiety    Bell's palsy after 1995 ?   no residual SE.   Depression    Genital herpes    History of drug abuse (HCC)    drug usser 1984 - 1990.  cocaine   HIV (human immunodeficiency virus infection) (HCC) 1990   from sexual contact who is unknown   Syphilis    treated   Past Surgical History:  Procedure Laterality Date   CESAREAN SECTION  1997   ESOPHAGOGASTRODUODENOSCOPY (EGD) WITH PROPOFOL N/A 03/02/2019   Procedure: ESOPHAGOGASTRODUODENOSCOPY (EGD) WITH PROPOFOL;  Surgeon: Willis Modena, MD;  Location: [facility];  Service: Endoscopy;  Laterality: N/A;   EUS N/A 03/02/2019   Procedure: FULL UPPER ENDOSCOPIC ULTRASOUND (EUS) RADIAL;  Surgeon: Willis Modena, MD;  Location: [facility];  Service: Endoscopy;   Laterality: N/A;   LAPAROSCOPIC CHOLECYSTECTOMY SINGLE PORT N/A 03/01/2019   Procedure: LAPAROSCOPIC CHOLECYSTECTOMY SINGLE SITE/ INTRAOPERATIVE CHOLANGIOGRAM;  Surgeon: Karie Soda, MD;  Location: [facility];  Service: General;  Laterality: N/A;   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   Right foot pain 05/22/2022   Right lumbar radiculopathy 05/08/2022   Abnormal CXR 02/15/2022   DDD (degenerative disc disease), lumbar 02/12/2022   Anterolisthesis of lumbosacral spine 02/12/2022   Allergic reaction to bee sting 07/27/2021   Weakness of both legs 03/07/2021   B12 deficiency 03/07/2021   Urinary incontinence 05/16/2020   Other fatigue 05/16/2020   History of syphilis 05/14/2020   Memory loss 02/14/2020   Insomnia 02/14/2020   Herpes zoster 12/31/2019   Normocytic anemia 03/01/2019   Hyperglycemia 03/01/2019   Headache 06/30/2018   Urinary frequency 05/01/2018   Cervical 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 (HPV) DNA test positive 10/18/2016   Essential hypertension 09/16/2014   Vasomotor rhinitis 05/05/2014   Benign paroxysmal positional vertigo 09/21/2012   Sinusitis 03/22/2009   Human immunodeficiency virus (HIV) disease (HCC) 09/06/2006   GENITAL HERPES 09/06/2006   Depression 09/06/2006  REFERRING PROVIDER: Janeece Riggers, FNP  REFERRING DIAG: G63  Polyneuropathy in diseases classified elsewhere  THERAPY DIAG:  Other disturbances of skin sensation  Muscle weakness (generalized)  Stiffness of right ankle, not elsewhere classified  Stiffness of left ankle, not elsewhere classified  Rationale for Evaluation and Treatment: Rehabilitation  ONSET DATE: PT order 04/16/23 / Exacerbation July 2023  SUBJECTIVE:   SUBJECTIVE STATEMENT:  Pt reports doing well.  New meds  controlling pain.  Feet still numb   PERTINENT HISTORY: HIV Pt states she has urinary/urge incontinence at times though can control it by what she drinks.  L5-S1 grade 1 anterolisthesis and lumbar DDD Pt reports having concentration and memory issues.  Hx of syphilis and B12 deficiency H/A, HTN, and depression BPPV--Pt states it's more seasonal with allergies.   PAIN:  Pain? No - just Numbness and tingling in bilat feet  Rated:  7/10    PRECAUTIONS: Other: HIV, L5-S1 grade 1 anterolisthesis   WEIGHT BEARING RESTRICTIONS: No  FALLS:  Has patient fallen in last 6 months? No  LIVING ENVIRONMENT: Lives with: lives alone Lives in: 1 story home Stairs: 5 steps to enter home with bilat rails Has following equipment at home: Dan Humphreys - 2 wheeled and rollator, quad cane  OCCUPATION: Pt is currently on disability.  PLOF: Independent  PATIENT GOALS: improve strength in ankles, improve sensation, decrease pain   OBJECTIVE:   DIAGNOSTIC FINDINGS:  EMG in 2023 showed peripheral neuropathy, no evidence for lumbosacaral radic or myelopathy in the R LE per MD notes  Lumbar MRI 2023:  facet hypertrophy at L5-S1 with resulting grade 1 anterolisthesis, annular disc bulging and at least moderate L > R NF narrowing, there is possible chronic encroachment on either L5 nn roots.  No other significant stenosis or nn root encroachment.  Low lying conus again noted without evidence of cord tethering.   LOWER EXTREMITY ROM:    ROM Right eval Left eval Right / Left 07/14/23  Hip flexion       Hip extension       Hip abduction       Hip adduction       Hip internal rotation       Hip external rotation       Knee flexion Limited Limited   Knee extension       Ankle dorsiflexion Unable to actively perform.  Pt resting in 36 deg of PF.  PROM:  14 deg Unable to actively perform.  Pt resting in 40 deg of PF PROM:  12 deg Active but with slow muscle recruitment ~10 d  PROM full   Ankle  plantarflexion Unable to actively perform.  Pt resting in 36 deg of PF Unable to actively perform.  Pt resting in 40 deg of PF Active but with slow muscle recruitment 10d PROM full  Ankle inversion Unable to actively perform.  PROM:  10 Unable to actively perform.  PROM:  22 Active but with slow muscle recruitment ~5d  Ankle eversion Unable to actively perform.  PROM:  15 Unable to actively perform.  PROM:  5 Active but with slow muscle recruitment ~5d   (Blank rows = not tested)   LOWER EXTREMITY MMT:   Pt has significant weakness in bilat feet being unable to actively perform DF, PF, Eve, and Inv.  Pt has slight active movement in 1st and 2nd toes bilat, but not in toes 3-5.   Hip flexion:  5/5 bilat Knee extension:  R:  3+/5  L:  4-/5 Knee flexion:  weak and limited with active movement  Gait 07/14/23: with walking stick pt gait improves: step length and cadence.  She continues to be limited with heel strike and toe off due to neuropathy. Her gait toleration has improved walking through grocery stores.  TODAY'S TREATMENT:                                                                                                                               Pt seen for aquatic therapy today.  Treatment took place in water 3.5 - 4 ft depth at the [facility] pool. Temp of water was 91.  Pt entered/exited the pool via stairs independently with bilat rail.  Exercises - Hand Buoy Carry   - Side Stepping  - Tandem Stance  - Forward Backward Tandem Walking  - Standing Toe Taps  - Braided Sidestepping with Arms Out   - Flutter Kicking/Windshield Wipers   - Sit to Stand   Pt requires the buoyancy and hydrostatic pressure of water for support, and to offload joints by unweighting joint load by at least 50 % in navel deep water and by at least 75-80% in chest to neck deep water.  Viscosity of the water is needed for resistance of strengthening. Water current perturbations provides challenge to  standing balance requiring increased core activation.    PATIENT EDUCATION:  Education details: aquatic exercise form, exercise rationale Person educated: Patient Education method: Programmer, multimedia, Facilities manager, Verbal cues Education comprehension: verbalized understanding, returned demonstration, verbal cues required, and needs further education  HOME EXERCISE PROGRAM: Access Code: [email or web address] URL: [email or web address] Date: 06/04/2023 Prepared by: Aaron Edelman  Exercises - Seated Long Arc Quad  - 2 x daily - 7 x weekly - 1-2 sets - 5 reps - Seated Heel Slide  - 2 x daily - 7 x weekly - 1 sets - 3-5 reps   Aquatic This aquatic home exercise program from [employer] utilizes pictures from land based exercises, but has been adapted prior to lamination and issuance.   Access Code: [email or web address] URL: [email or web address] Date: 07/08/2023 Prepared by: Geni Bers  Updated 07/14/23 Exercises - Hand Buoy Carry  - 1-3 x weekly - Side Stepping  - Tandem Stance  - Forward Backward Tandem Walking  - Standing Toe Taps  - 1 x daily - 1-3 x weekly - 1-3 sets - 10 reps - Braided Sidestepping with Arms Out  - 1 x daily - 7 x weekly - 3 sets - 10 reps - Flutter Kicking/Windshield Wipers  - 1 x daily - 1-3 x weekly - 1-3 sets - 10 reps - Sit to Stand  - 1 x daily - 1-3 x weekly - 1-3 sets - 10 reps  ASSESSMENT:  CLINICAL IMPRESSION: Pt instructed through final aquatic HEP. She is issued a laminated copy.  She has already been to 2 separate pools to check out and will go to one more today to decide where her  membership will be to have access to pool.  She reports her pain continues to be controlled with new meds.  She is able to stand longer periods of time to cook and reports walking through grocery store pushing a cart for ~20 minutes over w/e.  Her ankle AROM has improved but LTG not completely met.       OBJECTIVE IMPAIRMENTS: Abnormal gait, decreased activity  tolerance, decreased endurance, decreased mobility, difficulty walking, decreased ROM, decreased strength, hypomobility, impaired flexibility, and pain.   ACTIVITY LIMITATIONS: lifting, standing, squatting, stairs, and locomotion level  PARTICIPATION LIMITATIONS: meal prep, cleaning, shopping, and community activity  PERSONAL FACTORS: Time since onset of injury/illness/exacerbation and 3+ comorbidities: HIV, L5-S1 grade 1 anterolisthesis, and depression  are also affecting patient's functional outcome.   REHAB POTENTIAL: Good  CLINICAL DECISION MAKING: Evolving/moderate complexity  EVALUATION COMPLEXITY: Moderate   GOALS:  SHORT TERM GOALS: Target date: 06/25/2023  Pt will report at least a 25% improvement in sx's and mobility.  Baseline: 20% improvement reported - 07/02/23 Goal status: Met 07/14/23  2.  Pt will demo active movement in bilat ankle DF, PF, Eve, and Inv for improved strength, mobility, and circulation. Baseline:  Goal status: Met 07/14/23  3.  Pt report she is able to stand at least 8 mins for improved performance of household chores.  Baseline:  Goal status: Met 07/08/23  4.  Pt will tolerate aquatic therapy without adverse effects for improved tolerance to activity, mobility, sx's, and function.  Baseline:  Goal status:MET - 07/02/23    LONG TERM GOALS: Target date: 07/16/2023  Pt will demo bilat ankle AROM to be at least 10 deg in DF, 15 deg in inv, and 8 deg in eversion for improved ROM, stiffness, and gait. Baseline:  Goal status: Progress made but goal Not met due to neuropathy  2.  Pt will demo improved quality of gait including increased heel strike and toe off.  Baseline:  Goal status: Partially Met 07/14/23  3.  Pt will report at least a 50% improvement in ambulation distance and standing tolerance for improved mobility and performance of household chores. Baseline:  Goal status: INITIAL  4.  Pt will report improved ease, decreased pain, and increased  standing duration with cooking. Baseline:  Goal status: INITIAL  5.  Pt will be able to tolerate minimal manual resistance t/o bilat ankles for improved stability with gait and performance of functional mobility.  Baseline:  Goal status: Met 07/14/23  6.  Pt will be independent with land and aquatic HEP for improved pain, sx's, function, and tolerance to activity. Baseline:  Goal status: Met 07/14/23   PLAN:  PT FREQUENCY: 1-2x/week  PT DURATION: 6 weeks  PLANNED INTERVENTIONS: Therapeutic exercises, Therapeutic activity, Neuromuscular re-education, Balance training, Gait training, Patient/Family education, Self Care, Joint mobilization, Stair training, DME instructions, Aquatic Therapy, Electrical stimulation, Taping, Manual therapy, and Re-evaluation  PLAN FOR NEXT SESSION: Cont with aquatic therapy  Rushie Chestnut) Kadeisha Betsch MPT 07/14/23 11:04 AM [facility] [address] [place], [place], [address] Phone: [phone]   Fax:  [phone]

## 2023-07-15 NOTE — Telephone Encounter (Signed)
Informed patient about her medication being approved

## 2023-07-17 DIAGNOSIS — Z79891 Long term (current) use of opiate analgesic: Secondary | ICD-10-CM | POA: Diagnosis not present

## 2023-07-17 DIAGNOSIS — M79672 Pain in left foot: Secondary | ICD-10-CM | POA: Diagnosis not present

## 2023-07-17 DIAGNOSIS — M79671 Pain in right foot: Secondary | ICD-10-CM | POA: Diagnosis not present

## 2023-07-17 DIAGNOSIS — B20 Human immunodeficiency virus [HIV] disease: Secondary | ICD-10-CM | POA: Diagnosis not present

## 2023-07-17 DIAGNOSIS — G8929 Other chronic pain: Secondary | ICD-10-CM | POA: Diagnosis not present

## 2023-07-25 ENCOUNTER — Encounter: Payer: Self-pay | Admitting: Internal Medicine

## 2023-07-28 ENCOUNTER — Encounter: Payer: Self-pay | Admitting: Internal Medicine

## 2023-07-28 DIAGNOSIS — B20 Human immunodeficiency virus [HIV] disease: Secondary | ICD-10-CM

## 2023-07-29 MED ORDER — SULFAMETHOXAZOLE-TRIMETHOPRIM 800-160 MG PO TABS: 1.0000 | ORAL_TABLET | Freq: Every day | ORAL | 1 refills | Status: DC

## 2023-08-04 ENCOUNTER — Other Ambulatory Visit: Payer: BC Managed Care – PPO

## 2023-08-04 ENCOUNTER — Other Ambulatory Visit: Payer: Self-pay

## 2023-08-04 DIAGNOSIS — B20 Human immunodeficiency virus [HIV] disease: Secondary | ICD-10-CM

## 2023-08-06 LAB — BASIC METABOLIC PANEL
BUN/Creatinine Ratio: 14 (calc) (ref 6–22)
BUN: 17 mg/dL (ref 7–25)
CO2: 25 mmol/L (ref 20–32)
Calcium: 9.4 mg/dL (ref 8.6–10.4)
Chloride: 106 mmol/L (ref 98–110)
Creat: 1.24 mg/dL — ABNORMAL HIGH (ref 0.50–1.05)
Glucose, Bld: 110 mg/dL — ABNORMAL HIGH (ref 65–99)
Potassium: 3.7 mmol/L (ref 3.5–5.3)
Sodium: 140 mmol/L (ref 135–146)

## 2023-08-06 LAB — CBC WITH DIFFERENTIAL/PLATELET
Absolute Monocytes: 368 {cells}/uL (ref 200–950)
Basophils Absolute: 51 {cells}/uL (ref 0–200)
Basophils Relative: 1.1 %
Eosinophils Absolute: 110 {cells}/uL (ref 15–500)
Eosinophils Relative: 2.4 %
HCT: 37 % (ref 35.0–45.0)
Hemoglobin: 11.8 g/dL (ref 11.7–15.5)
Lymphs Abs: 1389 {cells}/uL (ref 850–3900)
MCH: 28.8 pg (ref 27.0–33.0)
MCHC: 31.9 g/dL — ABNORMAL LOW (ref 32.0–36.0)
MCV: 90.2 fL (ref 80.0–100.0)
MPV: 10 fL (ref 7.5–12.5)
Monocytes Relative: 8 %
Neutro Abs: 2682 {cells}/uL (ref 1500–7800)
Neutrophils Relative %: 58.3 %
Platelets: 241 10*3/uL (ref 140–400)
RBC: 4.1 10*6/uL (ref 3.80–5.10)
RDW: 12.2 % (ref 11.0–15.0)
Total Lymphocyte: 30.2 %
WBC: 4.6 10*3/uL (ref 3.8–10.8)

## 2023-08-06 LAB — LIPID PANEL
Cholesterol: 175 mg/dL (ref <200)
HDL: 49 mg/dL — ABNORMAL LOW (ref >=50)
LDL Cholesterol (Calc): 102 mg/dL — ABNORMAL HIGH
Non-HDL Cholesterol (Calc): 126 mg/dL (ref <130)
Total CHOL/HDL Ratio: 3.6 (calc) (ref <5.0)
Triglycerides: 147 mg/dL (ref <150)

## 2023-08-06 LAB — HIV-1 RNA QUANT-NO REFLEX-BLD
HIV 1 RNA Quant: NOT DETECTED {copies}/mL
HIV-1 RNA Quant, Log: NOT DETECTED {Log}

## 2023-08-06 LAB — T-HELPER CELL (CD4) - (RCID CLINIC ONLY)
CD4 % Helper T Cell: 9 % — ABNORMAL LOW (ref 33–65)
CD4 T Cell Abs: 107 /uL — ABNORMAL LOW (ref 400–1790)

## 2023-08-15 DIAGNOSIS — H5213 Myopia, bilateral: Secondary | ICD-10-CM | POA: Diagnosis not present

## 2023-08-18 ENCOUNTER — Encounter: Payer: Self-pay | Admitting: Physical Therapy

## 2023-08-18 ENCOUNTER — Other Ambulatory Visit: Payer: Self-pay

## 2023-08-18 ENCOUNTER — Encounter: Payer: Self-pay | Admitting: Internal Medicine

## 2023-08-18 ENCOUNTER — Ambulatory Visit (INDEPENDENT_AMBULATORY_CARE_PROVIDER_SITE_OTHER): Payer: BC Managed Care – PPO | Admitting: Internal Medicine

## 2023-08-18 ENCOUNTER — Ambulatory Visit: Payer: BC Managed Care – PPO | Attending: Nurse Practitioner | Admitting: Physical Therapy

## 2023-08-18 VITALS — BP 145/93 | HR 72

## 2023-08-18 VITALS — BP 163/88 | HR 66 | Temp 98.5°F | Wt 156.0 lb

## 2023-08-18 DIAGNOSIS — R269 Unspecified abnormalities of gait and mobility: Secondary | ICD-10-CM | POA: Insufficient documentation

## 2023-08-18 DIAGNOSIS — Z79899 Other long term (current) drug therapy: Secondary | ICD-10-CM | POA: Diagnosis not present

## 2023-08-18 DIAGNOSIS — B20 Human immunodeficiency virus [HIV] disease: Secondary | ICD-10-CM

## 2023-08-18 DIAGNOSIS — I10 Essential (primary) hypertension: Secondary | ICD-10-CM

## 2023-08-18 DIAGNOSIS — Z789 Other specified health status: Secondary | ICD-10-CM

## 2023-08-18 MED ORDER — TIVICAY 50 MG PO TABS: 50.0000 mg | ORAL_TABLET | Freq: Every day | ORAL | 11 refills | Status: DC

## 2023-08-18 MED ORDER — DESCOVY 200-25 MG PO TABS: 1.0000 | ORAL_TABLET | Freq: Every day | ORAL | 11 refills | Status: DC

## 2023-08-18 NOTE — Therapy (Signed)
OUTPATIENT PHYSICAL THERAPY NEURO EVALUATION   Patient Name: Stephanie Espinoza MRN: [id number] DOB:1955/12/28, 67 y.o., female Today's Date: 08/18/2023   PCP: Hillard Danker, MD REFERRING PROVIDER: Janeece Riggers, FNP  END OF SESSION:  PT End of Session - 08/18/23 1234     Visit Number 1    Number of Visits 1    Date for PT Re-Evaluation 08/18/23    Authorization Type [employer] and [employer] MCD    PT Start Time 1234    PT Stop Time 1308    PT Time Calculation (min) 34 min    Equipment Utilized During Treatment Gait belt    Activity Tolerance Patient tolerated treatment well    Behavior During Therapy WFL for tasks assessed/performed             Past Medical History:  Diagnosis Date   Abnormal Pap smear of cervix before 1995   always a repeat back to normal except for 1 time and had colpo bioosy -resutls were normal   Allergic rhinitis    Anxiety    Bell's palsy after 1995 ?   no residual SE.   Depression    Genital herpes    History of drug abuse (HCC)    drug usser 1984 - 1990.  cocaine   HIV (human immunodeficiency virus infection) (HCC) 1990   from sexual contact who is unknown   Syphilis    treated   Past Surgical History:  Procedure Laterality Date   CESAREAN SECTION  1997   ESOPHAGOGASTRODUODENOSCOPY (EGD) WITH PROPOFOL N/A 03/02/2019   Procedure: ESOPHAGOGASTRODUODENOSCOPY (EGD) WITH PROPOFOL;  Surgeon: Willis Modena, MD;  Location: [facility];  Service: Endoscopy;  Laterality: N/A;   EUS N/A 03/02/2019   Procedure: FULL UPPER ENDOSCOPIC ULTRASOUND (EUS) RADIAL;  Surgeon: Willis Modena, MD;  Location: [facility];  Service: Endoscopy;  Laterality: N/A;   LAPAROSCOPIC CHOLECYSTECTOMY SINGLE PORT N/A 03/01/2019   Procedure: LAPAROSCOPIC CHOLECYSTECTOMY SINGLE SITE/ INTRAOPERATIVE CHOLANGIOGRAM;  Surgeon: Karie Soda, MD;  Location: [facility];  Service: General;  Laterality: N/A;   Patient Active Problem List   Diagnosis Date Noted   Presenile  dementia with HIV infection (HCC) 01/30/2023   Encounter for general adult medical examination with abnormal findings 11/25/2022   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/22/2022   Right foot pain 05/22/2022   Right lumbar radiculopathy 05/08/2022   Abnormal CXR 02/15/2022   DDD (degenerative disc disease), lumbar 02/12/2022   Anterolisthesis of lumbosacral spine 02/12/2022   Allergic reaction to bee sting 07/27/2021   Weakness of both legs 03/07/2021   B12 deficiency 03/07/2021   Urinary incontinence 05/16/2020   Other fatigue 05/16/2020   History of syphilis 05/14/2020   Memory loss 02/14/2020   Insomnia 02/14/2020   Herpes zoster 12/31/2019   Normocytic anemia 03/01/2019   Hyperglycemia 03/01/2019   Headache 06/30/2018   Urinary frequency 05/01/2018   Cervical 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 (HPV) DNA test positive 10/18/2016   Essential hypertension 09/16/2014   Vasomotor rhinitis 05/05/2014   Benign paroxysmal positional vertigo 09/21/2012   Sinusitis 03/22/2009   Human immunodeficiency virus (HIV) disease (HCC) 09/06/2006   Genital herpes 09/06/2006   Depression 09/06/2006    ONSET DATE: 07/30/2023 (referral date)  REFERRING DIAG: G63 (ICD-10-CM) - Polyneuropathy in diseases classified elsewhere  THERAPY DIAG:  Abnormality of gait and mobility - Plan: PT plan of care cert/re-cert  Rationale for Evaluation and Treatment: Rehabilitation  SUBJECTIVE:  SUBJECTIVE STATEMENT: Patient reports that she was scheduled this appointment by her doctor. Patient reports that she has no feeling in her feet. Patient arrives to session using a walking stick with rubber tip attachment for stability. Patient reports that she feels like she has found ways to compensate relatively well with her balance but would  like to see if she needs PT for any reason. Patient denies any falls/near falls within the last 6 months. Reports using ankle braces as needed for busy environment.   Pt accompanied by: self  PERTINENT HISTORY:  Polyneuropathy, HIV,  L5-S1 grade 1 anterolisthesis and lumbar DDD, Hx of syphilis and B12 deficiency, H/A, HTN, and depression, BPPV--Pt states it's more seasonal with allergies.  PAIN:  Are you having pain? No  PRECAUTIONS: Fall  RED FLAGS: None   WEIGHT BEARING RESTRICTIONS: No  FALLS: Has patient fallen in last 6 months? No  LIVING ENVIRONMENT: Lives with: lives alone Lives in: House/apartment Stairs: Yes: External: 5 steps; can reach both Has following equipment at home: Dan Humphreys - 2 wheeled, Environmental consultant - 4 wheeled, and walking cane with quad tip  PLOF: Reports being fully independent   PATIENT GOALS: "Figure out if I need physical therapy."   OBJECTIVE:  Note: Objective measures were completed at Evaluation unless otherwise noted.  DIAGNOSTIC FINDINGS: No relevant recent imaging  COGNITION: Overall cognitive status: Within functional limits for tasks assessed   SENSATION: Reports no sensation below ankles bilaterally due to neuropathy  COORDINATION: WFL  EDEMA:  Absent   MUSCLE TONE: WFL  LOWER EXTREMITY ROM:     Little to no ankle AROM per patient when tested objectively, PROM WFL  LOWER EXTREMITY MMT:    MMT Right Eval Left Eval  Hip flexion 4+/5 4+/5  Hip extension    Hip abduction    Hip adduction    Hip internal rotation    Hip external rotation    Knee flexion 3/5 3/5  Knee extension 3/5 3/5  Ankle dorsiflexion 1/5 1/5  Ankle plantarflexion 1/5 1/5  Ankle inversion 1/5 1/5  Ankle eversion 1/5 1/5  (Blank rows = not tested)  Ankle MMT not consistent with functional presentation; more function with gait and balance then testing scores indicate  BED MOBILITY:  Reports independence   TRANSFERS: Assistive device utilized:  walking  cane with quad tip attachment   Sit to stand: Modified independence Stand to sit: Modified independence Chair to chair: Modified independence  GAIT: Gait pattern: decreased hip/knee flexion- Right, decreased hip/knee flexion- Left, decreased ankle dorsiflexion- Right, decreased ankle dorsiflexion- Left, poor foot clearance- Right, and poor foot clearance- Left Distance walked: various clinic distances, ~1 x 80 feet max Assistive device utilized: walking cane with quad tip attachment  Level of assistance: Modified independence Comments: appropriate pacing, functional foot clearnace and strength appropriate through swing for clearance  FUNCTIONAL TESTS:    [facility] PT Assessment - 08/18/23 0001       Standardized Balance Assessment   Standardized Balance Assessment 10 meter walk test;Five Times Sit to Stand;Berg Balance Test    Five times sit to stand comments  26.95 seconds    10 Meter Walk 0.60   m/s with walking stick with quad tip (modI)     Berg Balance Test   Sit to Stand Able to stand  independently using hands    Standing Unsupported Able to stand 2 minutes with supervision    Sitting with Back Unsupported but Feet Supported on Floor or Stool Able to sit 2  minutes under supervision    Stand to Sit Controls descent by using hands             PATIENT SURVEYS:  Not performed - patient subjectively reports no fear of falling at this time and feeling appropriate and safe with balance  TODAY'S TREATMENT:                                                                                                                               Initial Eval only  PATIENT EDUCATION: Education details: desire for PT at this time + examination findings Person educated: Patient Education method: Explanation Education comprehension: verbalized understanding  HOME EXERCISE PROGRAM: Not indicated at this time  GOALS: Not indicated at this time  ASSESSMENT:  CLINICAL IMPRESSION: Patient is  a 67 y.o. female who was seen today for physical therapy evaluation and treatment for impairments related to peripheral neuropathy. Patient presents with decreased AROM and strength most noticeable and ankles and knees but results do not accurately replicate functional level. Patient states at end of session she is happy with current functional level and does not want PT at this time as she feels like she is able to do the things she wants to. Patient did want evaluation to ensure tha she didn't miss any areas that she may need more help working on. While patient is at a falls risk by functional test scores, patient safe with use of cane throughout session, which she is using at home. Per patient request, will not pursue PT further at this time and educated on when to come back or return if noticed decline in function.   OBJECTIVE IMPAIRMENTS: Abnormal gait, decreased balance, difficulty walking, decreased ROM, and decreased strength.   ACTIVITY LIMITATIONS: bending, squatting, and locomotion level  PARTICIPATION LIMITATIONS: community activity  PERSONAL FACTORS: 3+ comorbidities: see above  are also affecting patient's functional outcome.   CLINICAL DECISION MAKING: Stable/uncomplicated  EVALUATION COMPLEXITY: Low  PLAN:  Not picking up for PT at this time due to patient satisfaction with current functional level at end of eval, aware of falls risk and need to use AD at this time and when to come back   Carmelia Bake, PT, DPT 08/18/2023, 1:20 PM

## 2023-08-18 NOTE — Progress Notes (Signed)
RFV: follow up for hiv disease Patient ID: Stephanie Espinoza, female   DOB: 11-02-1956, 67 y.o.   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 On bactrim oi proph; on bicktarvy which she takes daily.  Outpatient Encounter Medications as of 08/18/2023  Medication Sig   acyclovir ointment (ZOVIRAX) 5 % Apply topically every 3 (three) hours. Use as needed for symptoms relief for no more than 7 days.   albuterol (VENTOLIN HFA) 108 (90 Base) MCG/ACT inhaler INHALE 1-2 PUFFS INTO THE LUNGS EVERY 4 HOURS AS NEEDED FOR WHEEZING OR SHORTNESS OF BREATH.   bictegravir-emtricitabine-tenofovir AF (BIKTARVY) 50-200-25 MG TABS tablet TAKE 1 TABLET BY MOUTH 1 TIME A DAY   cyclobenzaprine (FLEXERIL) 5 MG tablet TAKE 1 TABLET BY MOUTH THREE TIMES A DAY AS NEEDED FOR MUSCLE SPASMS   EPINEPHrine (EPIPEN 2-PAK) 0.3 mg/0.3 mL IJ SOAJ injection Inject 0.3 mg into the muscle as needed for anaphylaxis.   fluticasone (FLONASE) 50 MCG/ACT nasal spray Place 2 sprays into both nostrils daily.   gabapentin (NEURONTIN) 100 MG capsule Take 100 mg by mouth at bedtime.   lidocaine (LIDODERM) 5 % Place 1 patch onto the skin daily. Remove & Discard patch within 12 hours or as directed by MD   sulfamethoxazole-trimethoprim (BACTRIM DS) 800-160 MG tablet Take 1 tablet by mouth daily.   traMADol (ULTRAM) 50 MG tablet Take 50 mg by mouth every 4 (four) hours as needed.   valsartan (DIOVAN) 40 MG tablet Take 1 tablet (40 mg total) by mouth daily.   No facility-administered encounter medications on file as of 08/18/2023.     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   Right foot pain 05/22/2022   Right lumbar radiculopathy 05/08/2022   Abnormal CXR 02/15/2022   DDD (degenerative disc disease), lumbar  02/12/2022   Anterolisthesis of lumbosacral spine 02/12/2022   Allergic reaction to bee sting 07/27/2021   Weakness of both legs 03/07/2021   B12 deficiency 03/07/2021   Urinary incontinence 05/16/2020   Other fatigue 05/16/2020   History of syphilis 05/14/2020   Memory loss 02/14/2020   Insomnia 02/14/2020   Herpes zoster 12/31/2019   Normocytic anemia 03/01/2019   Hyperglycemia 03/01/2019   Headache 06/30/2018   Urinary frequency 05/01/2018   Cervical 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 (HPV) DNA test positive 10/18/2016   Essential hypertension 09/16/2014   Vasomotor rhinitis 05/05/2014   Benign paroxysmal positional vertigo 09/21/2012   Sinusitis 03/22/2009   Human immunodeficiency virus (HIV) disease (HCC) 09/06/2006   Genital herpes 09/06/2006   Depression 09/06/2006     Health Maintenance Due  Topic Date Due   COVID-19 Vaccine (5 - 2023-24 season) 07/06/2023   MAMMOGRAM  08/03/2023     Review of Systems Alittle sluggish. Has cold -- running Recovering from mosquito bite- hive Physical Exam   BP (!) 172/103   Pulse 66   Temp 98.5 F (36.9 C) (Oral)   Wt 156 lb (70.8 kg)   LMP 11/04/1997   SpO2 96%   BMI 28.53 kg/m   Physical Exam  Constitutional:  oriented to person, place, and time. appears well-developed and well-nourished. No distress.  HENT: [place]/AT, PERRLA, no scleral icterus Mouth/Throat: Oropharynx is clear and moist. No oropharyngeal exudate.  Cardiovascular: Normal rate, regular rhythm and normal heart  sounds. Exam reveals no gallop and no friction rub.  No murmur heard.  Pulmonary/Chest: Effort normal and breath sounds normal. No respiratory distress.  has no wheezes.  Neck = supple, no nuchal rigidity Abdominal: Soft. Bowel sounds are normal.  exhibits no distension. There is no tenderness.  Lymphadenopathy: no cervical adenopathy. No axillary adenopathy Neurological: alert and oriented to person, place, and time.  Skin: Skin is warm and dry. No rash  noted. No erythema.  Psychiatric: a normal mood and affect.  behavior is normal.   Lab Results  Component Value Date   CD4TCELL 9 (L) 08/04/2023   Lab Results  Component Value Date   CD4TABS 107 (L) 08/04/2023   CD4TABS 119 (L) 03/17/2023   CD4TABS 119 (L) 01/16/2023   Lab Results  Component Value Date   HIV1RNAQUANT Not Detected 08/04/2023   Lab Results  Component Value Date   HEPBSAB No 12/29/2006   Lab Results  Component Value Date   LABRPR NON-REACTIVE 10/04/2022    CBC Lab Results  Component Value Date   WBC 4.6 08/04/2023   RBC 4.10 08/04/2023   HGB 11.8 08/04/2023   HCT 37.0 08/04/2023   PLT 241 08/04/2023   MCV 90.2 08/04/2023   MCH 28.8 08/04/2023   MCHC 31.9 (L) 08/04/2023   RDW 12.2 08/04/2023   LYMPHSABS 1,389 08/04/2023   MONOABS 0.2 05/11/2022   EOSABS 110 08/04/2023    BMET Lab Results  Component Value Date   NA 140 08/04/2023   K 3.7 08/04/2023   CL 106 08/04/2023   CO2 25 08/04/2023   GLUCOSE 110 (H) 08/04/2023   BUN 17 08/04/2023   CREATININE 1.24 (H) 08/04/2023   CALCIUM 9.4 08/04/2023   GFRNONAA >60 05/11/2022   GFRAA >60 03/03/2019      Assessment and Plan  Hiv disease = discontinue on biktarvy--- change to tivicay/descovy to see if any improvement with cd 4 count  Long term medicaiton management = cr is stable  Hypertension = recommend to take meds before next visit to get accurate representation  Health maintenance = received flu already; declined covid; make sure to get annual mammo

## 2023-08-21 DIAGNOSIS — G8929 Other chronic pain: Secondary | ICD-10-CM | POA: Diagnosis not present

## 2023-08-21 DIAGNOSIS — G63 Polyneuropathy in diseases classified elsewhere: Secondary | ICD-10-CM | POA: Diagnosis not present

## 2023-08-21 DIAGNOSIS — M79672 Pain in left foot: Secondary | ICD-10-CM | POA: Diagnosis not present

## 2023-08-21 DIAGNOSIS — Z79891 Long term (current) use of opiate analgesic: Secondary | ICD-10-CM | POA: Diagnosis not present

## 2023-08-21 DIAGNOSIS — B20 Human immunodeficiency virus [HIV] disease: Secondary | ICD-10-CM | POA: Diagnosis not present

## 2023-08-21 DIAGNOSIS — M79671 Pain in right foot: Secondary | ICD-10-CM | POA: Diagnosis not present

## 2023-08-30 ENCOUNTER — Other Ambulatory Visit (HOSPITAL_BASED_OUTPATIENT_CLINIC_OR_DEPARTMENT_OTHER): Payer: Self-pay | Admitting: Obstetrics & Gynecology

## 2023-08-30 DIAGNOSIS — A609 Anogenital herpesviral infection, unspecified: Secondary | ICD-10-CM

## 2023-09-01 ENCOUNTER — Encounter: Payer: Self-pay | Admitting: Internal Medicine

## 2023-09-02 NOTE — Telephone Encounter (Signed)
Tivicay and Descovy should not cause these issues and this is one of Dr. Blair Dolphin patients who has notoriously not been able to tolerate any medication. She has been on multiple different medication regimens over the years (see below) and we finally got her stable on [place], so my vote would be to go back to [place].   2008: Nevirapine and Truvada 2009-2010: Kaletra and Truvada 2011-2013: Truvada + Reyataz + Norvir 2014-2016: Complera 2017: Odefsey 2018[name]: Biktarvy 2022: Dovato 2023: Biktarvy again

## 2023-09-04 ENCOUNTER — Other Ambulatory Visit: Payer: Self-pay

## 2023-09-04 ENCOUNTER — Other Ambulatory Visit: Payer: Self-pay | Admitting: Internal Medicine

## 2023-09-04 DIAGNOSIS — B20 Human immunodeficiency virus [HIV] disease: Secondary | ICD-10-CM

## 2023-09-04 MED ORDER — BIKTARVY 50-200-25 MG PO TABS: 1.0000 | ORAL_TABLET | Freq: Every day | ORAL | 5 refills | Status: DC

## 2023-09-23 DIAGNOSIS — G8929 Other chronic pain: Secondary | ICD-10-CM | POA: Diagnosis not present

## 2023-09-23 DIAGNOSIS — M79672 Pain in left foot: Secondary | ICD-10-CM | POA: Diagnosis not present

## 2023-09-23 DIAGNOSIS — M79671 Pain in right foot: Secondary | ICD-10-CM | POA: Diagnosis not present

## 2023-09-23 DIAGNOSIS — Z79891 Long term (current) use of opiate analgesic: Secondary | ICD-10-CM | POA: Diagnosis not present

## 2023-09-23 DIAGNOSIS — B20 Human immunodeficiency virus [HIV] disease: Secondary | ICD-10-CM | POA: Diagnosis not present

## 2023-09-23 DIAGNOSIS — G63 Polyneuropathy in diseases classified elsewhere: Secondary | ICD-10-CM | POA: Diagnosis not present

## 2023-09-24 ENCOUNTER — Other Ambulatory Visit: Payer: Self-pay | Admitting: Internal Medicine

## 2023-09-24 DIAGNOSIS — B20 Human immunodeficiency virus [HIV] disease: Secondary | ICD-10-CM

## 2023-10-06 ENCOUNTER — Encounter: Payer: Self-pay | Admitting: Internal Medicine

## 2023-10-06 ENCOUNTER — Other Ambulatory Visit: Payer: Self-pay

## 2023-10-06 ENCOUNTER — Ambulatory Visit
Admission: RE | Admit: 2023-10-06 | Discharge: 2023-10-06 | Disposition: A | Discharge status: Home / Self Care | Payer: Medicare Other | Source: Ambulatory Visit | Attending: Internal Medicine | Admitting: Internal Medicine

## 2023-10-06 ENCOUNTER — Ambulatory Visit (INDEPENDENT_AMBULATORY_CARE_PROVIDER_SITE_OTHER): Payer: Medicare Other | Admitting: Internal Medicine

## 2023-10-06 VITALS — BP 153/87 | HR 73 | Temp 98.1°F | Ht 62.0 in | Wt 164.0 lb

## 2023-10-06 DIAGNOSIS — J45909 Unspecified asthma, uncomplicated: Secondary | ICD-10-CM | POA: Diagnosis not present

## 2023-10-06 DIAGNOSIS — B20 Human immunodeficiency virus [HIV] disease: Secondary | ICD-10-CM | POA: Diagnosis not present

## 2023-10-06 DIAGNOSIS — Z79899 Other long term (current) drug therapy: Secondary | ICD-10-CM

## 2023-10-06 DIAGNOSIS — R053 Chronic cough: Secondary | ICD-10-CM

## 2023-10-06 DIAGNOSIS — J4520 Mild intermittent asthma, uncomplicated: Secondary | ICD-10-CM

## 2023-10-06 NOTE — Progress Notes (Unsigned)
RFV: follow up for hiv disease Patient ID: Stephanie Espinoza, female   DOB: [birth year], 67 y.o.   MRN: [id number]  HPI The patient, with a history of HIV on Biktarvy, presents with a persistent cough productive of green phlegm. She reports no associated dyspnea, fever, or chills, but does note occasional night sweats. The cough is described as constant and worse in the morning, leading to concerns about her lung health. The patient also reports a history of asthma, but does not frequently use her inhaler due to discomfort with the side effects.  At last appt, we switched her from biktarvy to tivicay/descovy however she did not tolerate change in regimen due to side effects including vision changes, dizziness, and general malaise. However, she requested to be put back on Biktarvy as these side effects were intolerable. She currently takes Biktarvy in the evening due to initial gastrointestinal side effects, but has noticed recent insomnia.  The patient also reports neuropathy, for which she is taking tramadol. She has been experiencing difficulty with ambulation due to this neuropathy. She is also on Bactrim, presumably for prophylaxis given her HIV status.  Soc hx: The patient lives with her daughter and is otherwise independent, engaging in activities such as cooking and lawn care. She has other children who live out of state.  Outpatient Encounter Medications as of 10/06/2023  Medication Sig   acyclovir ointment (ZOVIRAX) 5 % APPLY TOPICALLY EVERY 3 (THREE) HOURS. USE AS NEEDED FOR SYMPTOMS RELIEF FOR NO MORE THAN 7 DAYS.   albuterol (VENTOLIN HFA) 108 (90 Base) MCG/ACT inhaler INHALE 1-2 PUFFS INTO THE LUNGS EVERY 4 HOURS AS NEEDED FOR WHEEZING OR SHORTNESS OF BREATH.   BIKTARVY 50-200-25 MG TABS tablet TAKE 1 TABLET BY MOUTH DAILY.   cyclobenzaprine (FLEXERIL) 5 MG tablet TAKE 1 TABLET BY MOUTH THREE TIMES A DAY AS NEEDED FOR MUSCLE SPASMS   EPINEPHrine (EPIPEN 2-PAK) 0.3 mg/0.3 mL IJ SOAJ  injection Inject 0.3 mg into the muscle as needed for anaphylaxis.   fluticasone (FLONASE) 50 MCG/ACT nasal spray Place 2 sprays into both nostrils daily.   gabapentin (NEURONTIN) 100 MG capsule Take 100 mg by mouth at bedtime.   lidocaine (LIDODERM) 5 % Place 1 patch onto the skin daily. Remove & Discard patch within 12 hours or as directed by MD   sulfamethoxazole-trimethoprim (BACTRIM DS) 800-160 MG tablet TAKE 1 TABLET BY MOUTH EVERY DAY   traMADol (ULTRAM) 50 MG tablet Take 50 mg by mouth every 4 (four) hours as needed.   valsartan (DIOVAN) 40 MG tablet Take 1 tablet (40 mg total) by mouth daily.   No facility-administered encounter medications on file as of 10/06/2023.     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   Right foot pain 05/22/2022   Right lumbar radiculopathy 05/08/2022   Abnormal CXR 02/15/2022   DDD (degenerative disc disease), lumbar 02/12/2022   Anterolisthesis of lumbosacral spine 02/12/2022   Allergic reaction to bee sting 07/27/2021   Weakness of both legs 03/07/2021   B12 deficiency 03/07/2021   Urinary incontinence 05/16/2020   Other fatigue 05/16/2020   History of syphilis 05/14/2020   Memory loss 02/14/2020   Insomnia 02/14/2020   Herpes zoster 12/31/2019   Normocytic anemia 03/01/2019   Hyperglycemia 03/01/2019   Headache 06/30/2018   Urinary frequency 05/01/2018  Cervical 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 (HPV) DNA test positive 10/18/2016   Essential hypertension 09/16/2014   Vasomotor rhinitis 05/05/2014   Benign paroxysmal positional vertigo 09/21/2012   Sinusitis 03/22/2009   Human immunodeficiency virus (HIV) disease (HCC) 09/06/2006   Genital herpes 09/06/2006   Depression 09/06/2006     Health Maintenance Due  Topic Date Due   Medicare Annual  Wellness (AWV)  Never done   COVID-19 Vaccine (5 - 2023-24 season) 07/06/2023   MAMMOGRAM  08/03/2023     Review of Systems 12 point ros except what is mentioned above Physical Exam   BP (!) 153/87   Pulse 73   Temp 98.1 F (36.7 C) (Temporal)   Ht 5\' 2"  (1.575 m)   Wt 164 lb (74.4 kg)   LMP 11/04/1997   SpO2 96%   BMI 30.00 kg/m   Physical Exam  Constitutional:  oriented to person, place, and time. appears well-developed and well-nourished. No distress.  HENT: [place]/AT, PERRLA, no scleral icterus Mouth/Throat: Oropharynx is clear and moist. No oropharyngeal exudate.  Cardiovascular: Normal rate, regular rhythm and normal heart sounds. Exam reveals no gallop and no friction rub.  No murmur heard.  Pulmonary/Chest: Effort normal and breath sounds normal. No respiratory distress.  has no wheezes.  Neck = supple, no nuchal rigidity Abdominal: Soft. Bowel sounds are normal.  exhibits no distension. There is no tenderness.  Lymphadenopathy: no cervical adenopathy. No axillary adenopathy Neurological: alert and oriented to person, place, and time.  Skin: Skin is warm and dry. No rash noted. No erythema.  Psychiatric: a normal mood and affect.  behavior is normal.   Lab Results  Component Value Date   CD4TCELL 9 (L) 08/04/2023   Lab Results  Component Value Date   CD4TABS 107 (L) 08/04/2023   CD4TABS 119 (L) 03/17/2023   CD4TABS 119 (L) 01/16/2023   Lab Results  Component Value Date   HIV1RNAQUANT Not Detected 08/04/2023   Lab Results  Component Value Date   HEPBSAB No 12/29/2006   Lab Results  Component Value Date   LABRPR NON-REACTIVE 10/04/2022    CBC Lab Results  Component Value Date   WBC 4.6 08/04/2023   RBC 4.10 08/04/2023   HGB 11.8 08/04/2023   HCT 37.0 08/04/2023   PLT 241 08/04/2023   MCV 90.2 08/04/2023   MCH 28.8 08/04/2023   MCHC 31.9 (L) 08/04/2023   RDW 12.2 08/04/2023   LYMPHSABS 1,389 08/04/2023   MONOABS 0.2 05/11/2022   EOSABS 110  08/04/2023    BMET Lab Results  Component Value Date   NA 140 08/04/2023   K 3.7 08/04/2023   CL 106 08/04/2023   CO2 25 08/04/2023   GLUCOSE 110 (H) 08/04/2023   BUN 17 08/04/2023   CREATININE 1.24 (H) 08/04/2023   CALCIUM 9.4 08/04/2023   GFRNONAA >60 05/11/2022   GFRAA >60 03/03/2019      Assessment and Plan  HIV Management   Currently on Biktarvy for HIV management. Experienced significant side effects (vision issues, dizziness, gastrointestinal symptoms) when previously switched off Biktarvy. Taking Biktarvy in the evening to mitigate gastrointestinal side effects but reported insomnia. Prefers evening dosing due to previous gastrointestinal issues.   - Continue Biktarvy at bedtime   - Order labs to monitor HIV management    Chronic Cough with Sputum Production   Persistent cough with green sputum, sometimes heavy. Asthma present, no new coughs or shortness of breath beyond usual asthma symptoms. Experiences night sweats and occasional feverish feelings. Discussed  potential for atypical infection.   - Order chest x-ray   - Provide sputum cup for sample collection and analysis    Asthma   Long-standing asthma contributing to shortness of breath. She has inhalers but infrequently uses them - Encourage use of inhaler as needed    General Health Maintenance   Up to date on vaccinations.   - No additional vaccines needed at this time    Follow-up   - Schedule future labs before visits.

## 2023-10-07 ENCOUNTER — Other Ambulatory Visit: Payer: Self-pay | Admitting: Internal Medicine

## 2023-10-08 LAB — COMPLETE METABOLIC PANEL WITH GFR
AG Ratio: 1 (calc) (ref 1.0–2.5)
ALT: 14 U/L (ref 6–29)
AST: 19 U/L (ref 10–35)
Albumin: 3.9 g/dL (ref 3.6–5.1)
Alkaline phosphatase (APISO): 106 U/L (ref 37–153)
BUN/Creatinine Ratio: 12 (calc) (ref 6–22)
BUN: 16 mg/dL (ref 7–25)
CO2: 23 mmol/L (ref 20–32)
Calcium: 9.6 mg/dL (ref 8.6–10.4)
Chloride: 106 mmol/L (ref 98–110)
Creat: 1.39 mg/dL — ABNORMAL HIGH (ref 0.50–1.05)
Globulin: 4 g/dL — ABNORMAL HIGH (ref 1.9–3.7)
Glucose, Bld: 81 mg/dL (ref 65–99)
Potassium: 3.9 mmol/L (ref 3.5–5.3)
Sodium: 139 mmol/L (ref 135–146)
Total Bilirubin: 0.7 mg/dL (ref 0.2–1.2)
Total Protein: 7.9 g/dL (ref 6.1–8.1)
eGFR: 42 mL/min/{1.73_m2} — ABNORMAL LOW (ref >=60)

## 2023-10-08 LAB — CBC WITH DIFFERENTIAL/PLATELET
Absolute Lymphocytes: 1509 {cells}/uL (ref 850–3900)
Absolute Monocytes: 460 {cells}/uL (ref 200–950)
Basophils Absolute: 60 {cells}/uL (ref 0–200)
Basophils Relative: 1.3 %
Eosinophils Absolute: 110 {cells}/uL (ref 15–500)
Eosinophils Relative: 2.4 %
HCT: 38.3 % (ref 35.0–45.0)
Hemoglobin: 11.8 g/dL (ref 11.7–15.5)
MCH: 27.9 pg (ref 27.0–33.0)
MCHC: 30.8 g/dL — ABNORMAL LOW (ref 32.0–36.0)
MCV: 90.5 fL (ref 80.0–100.0)
MPV: 10.2 fL (ref 7.5–12.5)
Monocytes Relative: 10 %
Neutro Abs: 2461 {cells}/uL (ref 1500–7800)
Neutrophils Relative %: 53.5 %
Platelets: 245 10*3/uL (ref 140–400)
RBC: 4.23 10*6/uL (ref 3.80–5.10)
RDW: 12.5 % (ref 11.0–15.0)
Total Lymphocyte: 32.8 %
WBC: 4.6 10*3/uL (ref 3.8–10.8)

## 2023-10-08 LAB — T-HELPER CELL (CD4) - (RCID CLINIC ONLY)
CD4 % Helper T Cell: 8 % — ABNORMAL LOW (ref 33–65)
CD4 T Cell Abs: 111 /uL — ABNORMAL LOW (ref 400–1790)

## 2023-10-08 LAB — HIV-1 RNA QUANT-NO REFLEX-BLD
HIV 1 RNA Quant: NOT DETECTED {copies}/mL
HIV-1 RNA Quant, Log: NOT DETECTED {Log_copies}/mL

## 2023-10-10 ENCOUNTER — Other Ambulatory Visit: Payer: Medicare Other

## 2023-10-10 ENCOUNTER — Other Ambulatory Visit: Payer: Self-pay

## 2023-10-10 DIAGNOSIS — B20 Human immunodeficiency virus [HIV] disease: Secondary | ICD-10-CM

## 2023-10-13 LAB — RESPIRATORY CULTURE OR RESPIRATORY AND SPUTUM CULTURE
MICRO NUMBER:: 15820756
SPECIMEN QUALITY:: ADEQUATE

## 2023-10-15 ENCOUNTER — Encounter: Payer: Self-pay | Admitting: Internal Medicine

## 2023-10-15 DIAGNOSIS — R059 Cough, unspecified: Secondary | ICD-10-CM

## 2023-10-15 MED ORDER — LEVOFLOXACIN 750 MG PO TABS: 750.0000 mg | ORAL_TABLET | Freq: Every day | ORAL | 0 refills | Status: DC

## 2023-10-16 MED ORDER — AMOXICILLIN-POT CLAVULANATE 875-125 MG PO TABS: 1.0000 | ORAL_TABLET | Freq: Two times a day (BID) | ORAL | 0 refills | Status: DC

## 2023-10-16 NOTE — Telephone Encounter (Signed)
Per Dr. Drue Second, discontinue Levaquin and send in amox/clav 875mg  bid x 5 days.   Sandie Ano, RN

## 2023-10-16 NOTE — Addendum Note (Signed)
Addended by: Linna Hoff D on: 10/16/2023 12:56 PM   Modules accepted: Orders

## 2023-10-20 ENCOUNTER — Other Ambulatory Visit: Payer: Self-pay | Admitting: Internal Medicine

## 2023-10-20 ENCOUNTER — Other Ambulatory Visit (HOSPITAL_BASED_OUTPATIENT_CLINIC_OR_DEPARTMENT_OTHER): Payer: Self-pay | Admitting: Certified Nurse Midwife

## 2023-10-20 DIAGNOSIS — A609 Anogenital herpesviral infection, unspecified: Secondary | ICD-10-CM

## 2023-10-20 DIAGNOSIS — B20 Human immunodeficiency virus [HIV] disease: Secondary | ICD-10-CM

## 2023-10-22 ENCOUNTER — Ambulatory Visit (INDEPENDENT_AMBULATORY_CARE_PROVIDER_SITE_OTHER): Payer: BC Managed Care – PPO | Admitting: Internal Medicine

## 2023-10-22 ENCOUNTER — Encounter: Payer: Self-pay | Admitting: Internal Medicine

## 2023-10-22 VITALS — BP 146/82 | HR 82 | Temp 98.8°F | Ht 62.0 in | Wt 169.0 lb

## 2023-10-22 DIAGNOSIS — Z0000 Encounter for general adult medical examination without abnormal findings: Secondary | ICD-10-CM | POA: Diagnosis not present

## 2023-10-22 DIAGNOSIS — Z0001 Encounter for general adult medical examination with abnormal findings: Secondary | ICD-10-CM

## 2023-10-22 NOTE — Patient Instructions (Signed)
  Ms. Pribble , Thank you for taking time to come for your Medicare Wellness Visit. I appreciate your ongoing commitment to your health goals. Please review the following plan we discussed and let me know if I can assist you in the future.   These are the goals we discussed:  Goals   None     This is a list of the screening recommended for you and due dates:  Health Maintenance  Topic Date Due   COVID-19 Vaccine (5 - 2024-25 season) 07/06/2023   Mammogram  08/03/2023   Cologuard (Stool DNA test)  08/14/2024   Medicare Annual Wellness Visit  10/21/2024   DTaP/Tdap/Td vaccine (2 - Td or Tdap) 10/10/2026   Pap with HPV screening  01/08/2028   Pneumonia Vaccine  Completed   Flu Shot  Completed   DEXA scan (bone density measurement)  Completed   Hepatitis C Screening  Completed   HIV Screening  Completed   Zoster (Shingles) Vaccine  Completed   HPV Vaccine  Aged Out

## 2023-10-22 NOTE — Progress Notes (Unsigned)
Subjective:    Stephanie Espinoza is a 67 y.o. female who presents for a Welcome to Medicare exam.        Objective:    Today's Vitals   10/22/23 0912 10/22/23 0918  BP: (!) 146/82   Pulse: 82   Temp: 98.8 F (37.1 C)   TempSrc: Oral   SpO2: 98%   Weight: 169 lb (76.7 kg)   Height: 5\' 2"  (1.575 m)   PainSc:  0-No pain  Body mass index is 30.91 kg/m.  Medications Outpatient Encounter Medications as of 10/22/2023  Medication Sig   acyclovir ointment (ZOVIRAX) 5 % APPLY TOPICALLY EVERY 3 (THREE) HOURS. USE AS NEEDED FOR SYMPTOMS RELIEF FOR NO MORE THAN 7 DAYS.   albuterol (VENTOLIN HFA) 108 (90 Base) MCG/ACT inhaler INHALE 1-2 PUFFS INTO THE LUNGS EVERY 4 HOURS AS NEEDED FOR WHEEZING OR SHORTNESS OF BREATH.   amoxicillin-clavulanate (AUGMENTIN) 875-125 MG tablet Take 1 tablet by mouth 2 (two) times daily.   BIKTARVY 50-200-25 MG TABS tablet TAKE 1 TABLET BY MOUTH DAILY.   cyclobenzaprine (FLEXERIL) 5 MG tablet TAKE 1 TABLET BY MOUTH THREE TIMES A DAY AS NEEDED FOR MUSCLE SPASMS   EPINEPHrine (EPIPEN 2-PAK) 0.3 mg/0.3 mL IJ SOAJ injection Inject 0.3 mg into the muscle as needed for anaphylaxis.   fluticasone (FLONASE) 50 MCG/ACT nasal spray Place 2 sprays into both nostrils daily.   gabapentin (NEURONTIN) 100 MG capsule Take 100 mg by mouth at bedtime.   lidocaine (LIDODERM) 5 % Place 1 patch onto the skin daily. Remove & Discard patch within 12 hours or as directed by MD   sulfamethoxazole-trimethoprim (BACTRIM DS) 800-160 MG tablet TAKE 1 TABLET BY MOUTH EVERY DAY   traMADol (ULTRAM) 50 MG tablet Take 50 mg by mouth every 4 (four) hours as needed.   valsartan (DIOVAN) 40 MG tablet Take 1 tablet (40 mg total) by mouth daily.   No facility-administered encounter medications on file as of 10/22/2023.     History: Past Medical History:  Diagnosis Date   Abnormal Pap smear of cervix before 1995   always a repeat back to normal except for 1 time and had colpo bioosy -resutls were  normal   Allergic rhinitis    Anxiety    Bell's palsy after 1995 ?   no residual SE.   Depression    Genital herpes    History of drug abuse (HCC)    drug usser 1984 - 1990.  cocaine   HIV (human immunodeficiency virus infection) (HCC) 1990   from sexual contact who is unknown   Syphilis    treated   Past Surgical History:  Procedure Laterality Date   CESAREAN SECTION  1997   ESOPHAGOGASTRODUODENOSCOPY (EGD) WITH PROPOFOL N/A 03/02/2019   Procedure: ESOPHAGOGASTRODUODENOSCOPY (EGD) WITH PROPOFOL;  Surgeon: Willis Modena, MD;  Location: [facility];  Service: Endoscopy;  Laterality: N/A;   EUS N/A 03/02/2019   Procedure: FULL UPPER ENDOSCOPIC ULTRASOUND (EUS) RADIAL;  Surgeon: Willis Modena, MD;  Location: [facility];  Service: Endoscopy;  Laterality: N/A;   LAPAROSCOPIC CHOLECYSTECTOMY SINGLE PORT N/A 03/01/2019   Procedure: LAPAROSCOPIC CHOLECYSTECTOMY SINGLE SITE/ INTRAOPERATIVE CHOLANGIOGRAM;  Surgeon: Karie Soda, MD;  Location: [facility];  Service: General;  Laterality: N/A;    Family History  Problem Relation Age of Onset   Lung cancer Mother    Diabetes Father    Breast cancer Other    Social History   Occupational History   Not on file  Tobacco Use  Smoking status: Former    Current packs/day: 0.00    Types: Cigarettes    Quit date: 03/01/1990    Years since quitting: 33.6   Smokeless tobacco: Never  Vaping Use   Vaping status: Never Used  Substance and Sexual Activity   Alcohol use: No   Drug use: No   Sexual activity: Not Currently    Partners: Male    Birth control/protection: Post-menopausal    Comment: declined condoms    Tobacco Counseling Counseling given: Yes   Immunizations and Health Maintenance Immunization History  Administered Date(s) Administered   Hepatitis B 07/27/2002, 08/17/2002, 12/28/2002   Influenza Inj Mdck Quad Pf 08/16/2018, 08/04/2021   Influenza Split 11/23/2012   Influenza Whole 12/16/2006, 07/22/2007, 07/27/2010    Influenza, Seasonal, Injecte, Preservative Fre 07/08/2023   Influenza,inj,Quad PF,6+ Mos 08/04/2017, 09/26/2020, 08/18/2022   Influenza-Unspecified 08/18/2013, 08/12/2019, 08/04/2021   Janssen (J&J) SARS-COV-2 Vaccination 02/03/2020, 09/22/2020   PNEUMOCOCCAL CONJUGATE-20 05/29/2021   Pfizer Covid-19 Vaccine Bivalent Booster 74yrs & up 08/04/2021   Pfizer(Comirnaty)Fall Seasonal Vaccine 12 years and older 08/18/2022   Pneumococcal Conjugate-13 09/28/2018   Pneumococcal Polysaccharide-23 07/27/2002, 09/25/2010   Tdap 10/10/2016   Zoster Recombinant(Shingrix) 03/30/2021, 06/01/2021   Health Maintenance Due  Topic Date Due   COVID-19 Vaccine (5 - 2024-25 season) 07/06/2023   MAMMOGRAM  08/03/2023    Activities of Daily Living    10/22/2023    9:20 AM  In your present state of health, do you have any difficulty performing the following activities:  Hearing? 0  Vision? 1  Difficulty concentrating or making decisions? 1  Walking or climbing stairs? 1  Dressing or bathing? 0  Doing errands, shopping? 1  Preparing Food and eating ? N  Using the Toilet? N  In the past six months, have you accidently leaked urine? N  Do you have problems with loss of bowel control? N  Managing your Medications? N  Managing your Finances? N  Housekeeping or managing your Housekeeping? Y    Physical Exam   Physical Exam Constitutional:      Appearance: She is well-developed.  HENT:     Head: Normocephalic and atraumatic.  Cardiovascular:     Rate and Rhythm: Normal rate and regular rhythm.  Pulmonary:     Effort: Pulmonary effort is normal. No respiratory distress.     Breath sounds: Normal breath sounds. No wheezing or rales.  Abdominal:     General: Bowel sounds are normal. There is no distension.     Palpations: Abdomen is soft.     Tenderness: There is no abdominal tenderness. There is no rebound.  Musculoskeletal:        General: Tenderness present.     Cervical back: Normal range of  motion.  Skin:    General: Skin is warm and dry.  Neurological:     Mental Status: She is alert and oriented to person, place, and time.     Sensory: Sensory deficit present.     Coordination: Coordination abnormal.    (optional), or other factors deemed appropriate based on the beneficiary's medical and social history and current clinical standards.   Advanced Directives: Does Patient Have a Medical Advance Directive?: No  EKG:  normal EKG, normal sinus rhythm, unchanged from previous tracings, left axis deviation    Assessment:    This is a routine wellness examination for this patient .   Vision/Hearing screen Vision Screening   Right eye Left eye Both eyes  Without correction  With correction 20/20 20/20 20/15   Hearing intact to whispered voice bilaterally   Goals   None    Depression Screen    10/22/2023    9:26 AM 08/18/2023   10:02 AM 03/24/2023   10:05 AM 01/30/2023   10:24 AM  PHQ 2/9 Scores  PHQ - 2 Score 0 0 0 0     Fall Risk    10/22/2023    9:25 AM  Fall Risk   Falls in the past year? 0  Number falls in past yr: 0  Injury with Fall? 0  Follow up Falls evaluation completed   Cognitive Function:        10/22/2023    9:32 AM  6CIT Screen  What Year? 0 points  What month? 0 points  What time? 0 points  Count back from 20 0 points  Months in reverse 0 points  Repeat phrase 0 points  Total Score 0 points    Patient Care Team: Myrlene Broker, MD as PCP - General (Internal Medicine) Cliffton Asters, MD (Inactive) as PCP - Infectious Diseases (Infectious Diseases) Karie Soda, MD as Consulting Physician (General Surgery) Birdie Riddle, LCSW (Inactive) as Counselor     Plan:     I have personally reviewed and noted the following in the patient's chart:   Medical and social history Use of alcohol, tobacco or illicit drugs  Current medications and supplements Functional ability and status Nutritional status Physical  activity Advanced directives List of other physicians Hospitalizations, surgeries, and ER visits in previous 12 months Vitals Screenings to include cognitive, depression, and falls Referrals and appointments  In addition, I have reviewed and discussed with patient certain preventive protocols, quality metrics, and best practice recommendations. A written personalized care plan for preventive services as well as general preventive health recommendations were provided to patient.    Myrlene Broker, MD 10/23/2023

## 2023-10-23 ENCOUNTER — Encounter: Payer: Self-pay | Admitting: Internal Medicine

## 2023-10-23 DIAGNOSIS — B20 Human immunodeficiency virus [HIV] disease: Secondary | ICD-10-CM | POA: Diagnosis not present

## 2023-10-23 DIAGNOSIS — M79672 Pain in left foot: Secondary | ICD-10-CM | POA: Diagnosis not present

## 2023-10-23 DIAGNOSIS — G63 Polyneuropathy in diseases classified elsewhere: Secondary | ICD-10-CM | POA: Diagnosis not present

## 2023-10-23 DIAGNOSIS — G8929 Other chronic pain: Secondary | ICD-10-CM | POA: Diagnosis not present

## 2023-10-23 DIAGNOSIS — Z79891 Long term (current) use of opiate analgesic: Secondary | ICD-10-CM | POA: Diagnosis not present

## 2023-10-23 DIAGNOSIS — M79671 Pain in right foot: Secondary | ICD-10-CM | POA: Diagnosis not present

## 2023-10-23 NOTE — Assessment & Plan Note (Signed)
Flu shot complete for season. Pneumonia due 2027. Shingrix complete. Tetanus due 2027. Cologuard due 2025. Mammogram due declines, pap smear up to date and dexa counseled. Counseled about sun safety and mole surveillance. Counseled about the dangers of distracted driving. Given 10 year screening recommendations.

## 2023-10-31 ENCOUNTER — Other Ambulatory Visit: Payer: Self-pay | Admitting: Internal Medicine

## 2023-10-31 ENCOUNTER — Encounter: Payer: Self-pay | Admitting: Internal Medicine

## 2023-10-31 DIAGNOSIS — R059 Cough, unspecified: Secondary | ICD-10-CM

## 2023-11-11 ENCOUNTER — Telehealth: Payer: Medicare Other | Admitting: Nurse Practitioner

## 2023-11-11 DIAGNOSIS — J0140 Acute pansinusitis, unspecified: Secondary | ICD-10-CM | POA: Diagnosis not present

## 2023-11-11 DIAGNOSIS — R051 Acute cough: Secondary | ICD-10-CM | POA: Diagnosis not present

## 2023-11-11 MED ORDER — AZITHROMYCIN 250 MG PO TABS: ORAL_TABLET | ORAL | 0 refills | Status: AC

## 2023-11-11 NOTE — Progress Notes (Signed)
 Virtual Visit Consent   Stephanie Espinoza, you are scheduled for a virtual visit with a [facility] provider today. Just as with appointments in the office, your consent must be obtained to participate. Your consent will be active for this visit and any virtual visit you may have with one of our providers in the next 365 days. If you have a MyChart account, a copy of this consent can be sent to you electronically.  As this is a virtual visit, video technology does not allow for your provider to perform a traditional examination. This may limit your provider's ability to fully assess your condition. If your provider identifies any concerns that need to be evaluated in person or the need to arrange testing (such as labs, EKG, etc.), we will make arrangements to do so. Although advances in technology are sophisticated, we cannot ensure that it will always work on either your end or our end. If the connection with a video visit is poor, the visit may have to be switched to a telephone visit. With either a video or telephone visit, we are not always able to ensure that we have a secure connection.  By engaging in this virtual visit, you consent to the provision of healthcare and authorize for your insurance to be billed (if applicable) for the services provided during this visit. Depending on your insurance coverage, you may receive a charge related to this service.  I need to obtain your verbal consent now. Are you willing to proceed with your visit today? Stephanie Espinoza has provided verbal consent on 11/11/2023 for a virtual visit (video or telephone). Lauraine Kitty, FNP  Date: 11/11/2023 1:19 PM  Virtual Visit via Video Note   I, Lauraine Kitty, connected with  Stephanie Espinoza  ([id number], [birth year]) on 11/11/23 at  1:30 PM EST by a video-enabled telemedicine application and verified that I am speaking with the correct person using two identifiers.  Location: Patient: Virtual Visit Location Patient: Home Provider:  Virtual Visit Location Provider: Home Office   I discussed the limitations of evaluation and management by telemedicine and the availability of in person appointments. The patient expressed understanding and agreed to proceed.    History of Present Illness: Stephanie Espinoza is a 68 y.o. who identifies as a female who was assigned female at birth, and is being seen today for cough and congestion and headache.  Symptom onset was last week after returning from travel  Symptoms were initially fatigue and developed into cough and congestion with decreased fatigue  She has a loss of taste Persistent sinus congestion with purulent drainage  She has not taken a COVID test  Has had a low grade fever since onset   She has been using tylenol  and continues her bactrim  daily   She feels a mixture of symptoms in sinuses and chest   Denies tightness in chest or wheezing  She has Albuterol  but has not needed to use it yet  Also has Flonase  but is not currently using  She was treated with Augmentin  10/16/23 for a respiratory infection.   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   Right foot pain 05/22/2022   Right lumbar radiculopathy 05/08/2022   DDD (degenerative disc disease), lumbar 02/12/2022   Anterolisthesis  of lumbosacral spine 02/12/2022   Weakness of both legs 03/07/2021   B12 deficiency 03/07/2021   Urinary incontinence 05/16/2020   Other fatigue 05/16/2020   History of syphilis 05/14/2020   Memory loss 02/14/2020   Insomnia 02/14/2020   Herpes zoster 12/31/2019   Normocytic anemia 03/01/2019   Hyperglycemia 03/01/2019   Headache 06/30/2018   Urinary frequency 05/01/2018   Cervical 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 (HPV) DNA test positive 10/18/2016    Essential hypertension 09/16/2014   Vasomotor rhinitis 05/05/2014   Benign paroxysmal positional vertigo 09/21/2012   Sinusitis 03/22/2009   Human immunodeficiency virus (HIV) disease (HCC) 09/06/2006   Genital herpes 09/06/2006   Depression 09/06/2006    Allergies:  Allergies  Allergen Reactions   Gabapentin      Swelling,  (Pt states was pre-gabapentin )   Haldol [Haloperidol] Other (See Comments)    Wipes out her memory   Nevirapine     REACTION: Rash 10/08   Thorazine [Chlorpromazine] Other (See Comments)    makes me comatose   Medications:  Current Outpatient Medications:    acyclovir  ointment (ZOVIRAX ) 5 %, APPLY TOPICALLY EVERY 3 (THREE) HOURS. USE AS NEEDED FOR SYMPTOMS RELIEF FOR NO MORE THAN 7 DAYS., Disp: 15 g, Rfl: 3   albuterol  (VENTOLIN  HFA) 108 (90 Base) MCG/ACT inhaler, INHALE 1-2 PUFFS INTO THE LUNGS EVERY 4 HOURS AS NEEDED FOR WHEEZING OR SHORTNESS OF BREATH., Disp: 18 each, Rfl: 2   amoxicillin -clavulanate (AUGMENTIN ) 875-125 MG tablet, Take 1 tablet by mouth 2 (two) times daily., Disp: 10 tablet, Rfl: 0   BIKTARVY  50-200-25 MG TABS tablet, TAKE 1 TABLET BY MOUTH DAILY., Disp: 30 tablet, Rfl: 5   cyclobenzaprine  (FLEXERIL ) 5 MG tablet, TAKE 1 TABLET BY MOUTH THREE TIMES A DAY AS NEEDED FOR MUSCLE SPASM, Disp: 30 tablet, Rfl: 1   EPINEPHrine  (EPIPEN  2-PAK) 0.3 mg/0.3 mL IJ SOAJ injection, Inject 0.3 mg into the muscle as needed for anaphylaxis., Disp: 1 each, Rfl: 2   fluticasone  (FLONASE ) 50 MCG/ACT nasal spray, Place 2 sprays into both nostrils daily., Disp: 48 mL, Rfl: 2   gabapentin  (NEURONTIN ) 100 MG capsule, Take 100 mg by mouth at bedtime., Disp: , Rfl:    lidocaine  (LIDODERM ) 5 %, Place 1 patch onto the skin daily. Remove & Discard patch within 12 hours or as directed by MD, Disp: 90 patch, Rfl: 3   sulfamethoxazole -trimethoprim  (BACTRIM  DS) 800-160 MG tablet, TAKE 1 TABLET BY MOUTH EVERY DAY, Disp: 30 tablet, Rfl: 3   traMADol (ULTRAM) 50 MG tablet, Take 50  mg by mouth every 4 (four) hours as needed., Disp: , Rfl:    valsartan  (DIOVAN ) 40 MG tablet, Take 1 tablet (40 mg total) by mouth daily., Disp: 90 tablet, Rfl: 3  Observations/Objective: Patient is well-developed, well-nourished in no acute distress.  Resting comfortably  at home.  Head is normocephalic, atraumatic.  No labored breathing.  Speech is clear and coherent with logical content.  Patient is alert and oriented at baseline.    Assessment and Plan:  1. Acute non-recurrent pansinusitis (Primary)  Restart Flonase  may use OTC Mucinex as needed as well   - azithromycin  (ZITHROMAX ) 250 MG tablet; Take 2 tablets on day 1, then 1 tablet daily on days 2 through 5  Dispense: 6 tablet; Refill: 0  2. Acute cough  Start Albuterol  as needed  - azithromycin  (ZITHROMAX ) 250 MG tablet; Take 2 tablets on day 1, then 1 tablet daily on days 2 through 5  Dispense: 6 tablet; Refill: 0  Follow Up Instructions: I discussed the assessment and treatment plan with the patient. The patient was provided an opportunity to ask questions and all were answered. The patient agreed with the plan and demonstrated an understanding of the instructions.  A copy of instructions were sent to the patient via MyChart unless otherwise noted below.    The patient was advised to call back or seek an in-person evaluation if the symptoms worsen or if the condition fails to improve as anticipated.    Lauraine Kitty, FNP

## 2023-11-22 ENCOUNTER — Encounter: Payer: Self-pay | Admitting: Internal Medicine

## 2023-11-22 DIAGNOSIS — B20 Human immunodeficiency virus [HIV] disease: Secondary | ICD-10-CM

## 2023-11-24 ENCOUNTER — Encounter: Payer: Self-pay | Admitting: Internal Medicine

## 2023-11-24 MED ORDER — BIKTARVY 50-200-25 MG PO TABS: 1.0000 | ORAL_TABLET | Freq: Every day | ORAL | 1 refills | Status: DC

## 2023-12-03 ENCOUNTER — Encounter: Payer: Self-pay | Admitting: Internal Medicine

## 2023-12-04 ENCOUNTER — Other Ambulatory Visit: Payer: Self-pay

## 2023-12-04 NOTE — Patient Outreach (Signed)
Aging Gracefully Program  12/04/2023  Stephanie Espinoza [birth year] [id number]   [facility] Evaluation Interviewer made contact with patient. Aging Gracefully survey completed.   Interviewer will send referral to RN and OT for follow up.  [facility]  Population Health Care Management Assistant  Direct Dial: [phone]  Fax: [phone] Website: [email or web address]

## 2023-12-04 NOTE — Patient Outreach (Signed)
Aging Gracefully Program  12/04/2023  Stephanie Espinoza 1955/11/30 [id number]   [facility] Evaluation Interviewer attempted to call patient on today regarding Aging Gracefully referral. No answer from patient after multiple rings. CMA left confidential voicemail for patient to return call.  Will attempt to call back within 1 week.    [facility]  Population Health Care Management Assistant  Direct Dial: [phone]  Fax: [phone] Website: [email or web address]

## 2023-12-19 ENCOUNTER — Other Ambulatory Visit: Payer: Self-pay | Admitting: Rehabilitation

## 2023-12-23 NOTE — Patient Outreach (Signed)
Aging Gracefully Program  OT Initial Visit  12/23/2023  Stephanie Espinoza 09/29/1956 [id number]  Visit:  1- Initial Visit  Start Time:  1400 End Time:  1530 Total Minutes:  90  CCAP: Typical Daily Routine: What Types Of Care Problems Are You Having Throughout The Day?: accessing the tub which is preferred for LE discomfort/pain. LE cramps in the morning. What Kind Of Help Do You Receive?: family support, one daughter lives nearby and visits each weekend Do You Think You Need Other Types Of Help?: Not sure What Do You Think Would Make Everyday Life Easier For You?: Access to outdoors, improving safety of outdoor access What Is A Good Day Like?: able to get up by 10:30 make own breakfast and take meds to be ready for the day. Making dinner and takin ga shower around 8 pm. Enjoying her pets and letting the dog out several times a day What Is A Bad Day Like?: leg cramps in morning and difficulty getting out of bed. Will stay in bed most of the day Do You Have Time For Yourself?: yes Patient Reported Equipment: Patient Reported Equipment Currently Used: Rollator, Buyer, retail, Paediatric nurse (4 point walker with front wheels) Functional Mobility-Walking Indoors/Getting Around the [employer]:   Functional Mobility-Walk A Block: Walk A Block: A Lot Of Difficulty Do You:: Use A Device Importance Of Learning New Strategies:: Not At All Functional Mobility-Maintain Balance While Showering: Maintaining Balance While Showering: Moderate Difficulty Do You:: Use A Device Importance Of Learning New Strategies:: Very Much Other Comments:: has suction cup grab bar that does not stay in place. Using a shower chair Observation: Maintain Balance While Showering: N/O Safety: A Little Risk Efficiency: Somewhat Intervention: Yes Functional Mobility-Stooping, Crouching, Kneeling To Retreive Item: Stooping, Crouching, or Kneeling To Retrieve Item: Moderate Difficulty Do You:: Use A Device Importance Of Learning  New Strategies:: A Little Functional Mobility-Bending From Standing Position To Pick Up Clothing Off The Floor: Bending Over From Standing Position To Pick Up Clothing Off The Floor: A Little Difficulty Do You:: No Device/No Assistance Importance Of Learning New Strategies:: Moderate Observation: Bending Over From Standing Postion To Pick Up Clothing Off Of Floor: Independent With Pain, Difficulty, Or Use Of Device Safety: A Little Risk Efficiency: Not At All Intervention: Yes Functional Mobility-Reaching For Items Above Shoulder Level: Reaching For Items Above Shoulder Level: No Difficulty Do You:: No Device/No Assistance Importance Of Learning New Strategies:: Not At All Functional Mobility-Climb 1 Flight Of Stairs: Climb 1 Flight Of Stairs: A Little Difficulty Do You:: Use A Device Importance Of Learning New Strategies:: A Little Functional Mobility-Move In And Out Of Chair: Move In and Out Of A Chair: No Difficulty Do You::  (sits on higher seat) Importance Of Learning New Strategies:: Not At All Functional Mobility-Move In And Out Of Bed: Move In and Out Of Bed: No Difficulty Do You:: Use A Device Importance Of Learning New Strategies:: Not At All Functional Mobility-Move In And Out Of Bath/Shower: Move In And Out Of A Bath/Shower: A Lot Of Difficulty Do You:: Use Personal Assistance Importance Of Learning New Strategies:: Very Much Observation: Move In And Out Of Bath/Shower: N/O Safety: Moderate/Extreme Risk Efficiency: Somewhat (efficient with assist from a person) Intervention: Yes Functional Mobility-Get On And Off Toilet: Getting Up From The Floor: A Lot Of Difficulty Do You:: Use A Device Importance Of Learning New Strategies:: A Little Functional Mobility-Into And Out Of Car, Not Including Driving: Into  And Out Of Car, Not Including Driving: No  Difficulty Do You:: Use A Device Importance Of Learning New Strategies:: Not At All Functional Mobility-Other Mobility  Difficulty:      Activities of Daily Living-Bathing/Showering: ADL-Bathing/Showering: No Difficulty Do You:: No Device/No Assistance Importance Of Learning New Strategies: Not At All Activities of Daily Living-Personal Hygiene and Grooming: Personal Hygiene and Grooming: No Difficulty Do You:: No Device/No Assistance Importance Of Learning New Strategies: Not At All Activities of Daily Living-Toilet Hygiene: Toilet Hygiene: No Difficulty Do You:: No Device/No Assistance Importance Of Learning New Strategies: Not At All Activities of Daily Living-Put On And Take Off Undergarments (Incl. Fasteners): Put On And Take Off Undergarments (Incl. Fasteners): A Little Difficulty Do You:: No Device/No Assistance Importance Of Learning New Strategies: Not At All Activities of Daily Living-Put On And Take Off Shirt/Dress/Coat (Incl. Fasteners): Put On And Take Off Shirt/Dress/Coat (Incl. Fasteners): No Difficulty Do You:: No Device/No Assistance Importance Of Learning New Strategies: Not At All Activities of Daily Living-Put On And Take Off Socks And Shoes: Put On And Take Off Socks And  Shoes: No Difficulty Do You:: No Device/No Assistance Importance Of Learning New Strategies: Not At All Other Comments:: takes extra time but can flex knee and can independently don socks and shoes ADL Observation: Put On And Take Off Socks And Shoes: N/O Activities of Daily Living-Feed Self: Feed Self: No Difficulty Do You:: No Device/No Assistance Importance Of Learning New Strategies: Not At All Activities of Daily Living-Rest And Sleep: Rest and Sleep: No Difficulty Do You:: No Device/No Assistance Importance Of Learning New Strategies: Not At All Activities of Daily Living-Sexual Activity: Sexual  Activity: N/A Activities of Daily Living-Other Activity Identified:    Instrumental Activities of Daily Living-Light Homemaking (Laundry, Straightening Up, Vacuuming):  Do Light Homemaking (Laundry,  Straightening Up, Vacuuming): A Little Difficulty Do You:: Use Both A Device And Personal Assistance Importance Of Learning New Strategies: A Little Other Comments:: uses breaks and focus on one room at a time over a day or two. Daughter also helps Instrumental Activities of Daily Living-Making A Bed: Making a Bed: A Little Difficulty Do You:: No Device/No Assistance Importance Of Learning New Strategies: Not At All Other Comments:: takes excessive time but can manage Instrumental Activities of Daily Living-Washing Dishes By Hand While Standing At The Sink: Washing Dishes By Hand While Standing At The Sink: No Difficulty Do You:: Use A Device Importance Of Learning New Strategies: Not At All Other Comments:: uses the dishwasher, or washes a few dishes at a time Instrumental Activities of Daily Living-Grocery Shopping: Do Grocery Shopping: A Little Difficulty Do You:: Use Both A Device And Personal Assistance Importance Of Learning New Strategies: Not At All Other Comments:: manages with her daughter Instrumental Activities of Daily Living-Use Telephone: Use Telephone: No Difficulty Do You:: No Device/No Assistance Importance Of Learning New Strategies: Not At All Instrumental Activities of Daily Living-Financial Management: Financial Management: No Difficulty Do You:: No Device/No Assistance Importance Of Learning New Strategies: Not At All Instrumental Activities of Daily Living-Medications: Take Medications: No Difficulty Do You:: No Device/No Assistance Importance Of Learning New Strategies: Not At All Instrumental Activities of Daily Living-Health Management And Maintenance: Health Management & Maintenance: No Difficulty Do You:: No Device/No Assistance Importance Of Learning New Strategies: Not At All Instrumental Activities of Daily Living-Meal Preparation and Clean-Up: Meal Preparation and Clean-Up: No Difficulty Do You:: Use Both A Device And Personal  Assistance Importance Of Learning New Strategies: Not At All Other Comments:: can cook independent and daughter often cooks  as well Instrumental Activities of Daily Living-Provide Care For Others/Pets: Care For Others/Pets: A Little Difficulty Do You:: Use A Device Importance Of Learning New Strategies: A Little Other Comments:: would like improved access to outside to assist with taking do out Safety: Moderate/Extreme Risk Efficiency: Somewhat Intervention: Yes Other Comments:: back deck boards are loose, limited time outside in summer due to allergy to bug bites Instrumental Activities of Daily Living-Take Part In Organized Social Activities: Take Part In Organized Social Activities: No Difficulty Do You:: Use A Device Importance Of Learning New Strategies: Not At All Instrumental Activities of Daily Living-Leisure Participation: Leisure Participation: No Difficulty Do You:: Use A Device Importance Of Learning New Strategies: Not At All Instrumental Activities of Daily Living-Employment/Volunteer Activities: Employment/Volunteer Activities: N/A Instrumental Activities of Daily Living-Other Identifies:    Readiness To Change Score:  Readiness to Change Score: 9  Home Environment Assessment: Outside Home Entry:: 5 steps into the house, has hand rail front door. Side door into garage has unstable cement block step. Also steps into house x 4 without handrail. Steps and rise are comfortable for client to step on. Back door difficult to open, back deck has uneven planks, steps off deck not used and are unsafe/rotting wood, steep Bathroom:: hall bath needs grab bars. Client prefers to take a bath and needs daughter's assist to step in and out. Main BR bathroom has stall showe without grab bars. Toilet is ADA 17 in height but without hand rail she cannot transfer sit to stand. uses sink counter to assist sit to stand. Master Bedroom:: excessive reach for overhead light/fan, short  chain Smoke/CO2 Detector:: yes Other Home Environment Concerns:: client is allergic to bug bites and cannot be outside for long, interested in screened in porch to allow more outside access.  Durable Medical Equipment: Durable Medical Equipment: Peggye Pitt, Paediatric nurse With Back  Patient Education: Education Provided: Yes Education Details: AG consent form, check for Engineer, site, copy of goals Person(s) Educated: Patient Comprehension: Verbalized Understanding  Goals:  Goals Addressed             This Visit's Progress    Patient Stated       Improve safety and mobility in bathrooms     Patient Stated       Improve safety and access to outside (deck and side of house)     Patient Stated       Improve reaching safety        Post Clinical Reasoning: Client Action (Goal) One Interventions: Improve safety and mobility in bathrooms Client Action (Goal) Two Interventions: Improve safety and access to outside Client Action (Goal) Three Interventions: Improve reaching safety and picking up from the floor  Nickolas Madrid, OTR/L 12/23/23 9:23 AM

## 2023-12-29 ENCOUNTER — Other Ambulatory Visit: Payer: Self-pay | Admitting: Internal Medicine

## 2023-12-29 ENCOUNTER — Other Ambulatory Visit: Payer: Self-pay | Admitting: *Deleted

## 2023-12-29 NOTE — Patient Outreach (Signed)
 Aging Gracefully Program  12/29/2023  Stephanie Espinoza 10/27/1956 [id number]  Telephone call made to Stephanie Espinoza to schedule first home visit with this Aging Clinical cytogeneticist. Stephanie Espinoza is agreeable to home visit appointment for Tuesday, March 4th at 11 am.   Raiford Noble, MSN, RN, BSN [place]  [facility] RN Case Manager for Aging Gracefully Direct Dial: [phone]

## 2024-01-02 ENCOUNTER — Other Ambulatory Visit: Payer: Self-pay

## 2024-01-02 DIAGNOSIS — Z113 Encounter for screening for infections with a predominantly sexual mode of transmission: Secondary | ICD-10-CM

## 2024-01-02 DIAGNOSIS — B20 Human immunodeficiency virus [HIV] disease: Secondary | ICD-10-CM

## 2024-01-05 ENCOUNTER — Other Ambulatory Visit: Payer: Self-pay

## 2024-01-05 ENCOUNTER — Other Ambulatory Visit: Payer: Medicare PPO

## 2024-01-05 DIAGNOSIS — B20 Human immunodeficiency virus [HIV] disease: Secondary | ICD-10-CM | POA: Diagnosis not present

## 2024-01-05 DIAGNOSIS — Z113 Encounter for screening for infections with a predominantly sexual mode of transmission: Secondary | ICD-10-CM | POA: Diagnosis not present

## 2024-01-06 ENCOUNTER — Encounter: Payer: Self-pay | Admitting: *Deleted

## 2024-01-06 ENCOUNTER — Other Ambulatory Visit: Payer: Self-pay | Admitting: *Deleted

## 2024-01-06 LAB — T-HELPER CELL (CD4) - (RCID CLINIC ONLY)
CD4 % Helper T Cell: 12 % — ABNORMAL LOW (ref 33–65)
CD4 T Cell Abs: 129 /uL — ABNORMAL LOW (ref 400–1790)

## 2024-01-06 NOTE — Patient Outreach (Signed)
 Aging Gracefully Program  RN Visit  01/06/2024  Stephanie Espinoza [birth year] [id number]  Visit:  RN Visit Number: 1- Initial Visit  RN TIME CALCULATION: Start TIme:  RN Start Time Calculation: 1100 End Time:  RN Stop Time Calculation: 1255 Total Minutes:  RN Time Calculation: 115  Readiness To Change Score:  Readiness to Change Score: 10  Universal RN Interventions: Calendar Distribution: Yes Exercise Review: No Medications: Yes Medication Changes: Yes Mood: Yes Pain: Yes PCP Advocacy/Support: No Fall Prevention: Yes Incontinence: Yes Clinician View Of Client Situation: Arrived to client's home. Stephanie Espinoza was sitting at kitchen table. Two cats and 1 dog present. Stephanie Espinoza ambulates independently with cane. Home neat. Area rugs in some areas of the home. Client View Of His/Her Situation: Stephanie Espinoza reports she is doing well overall. Denies pain at the time. Denies any recent falls. States she will like to go outside more when the weather permits. [employer] has not done assestment yet.  Healthcare Provider Communication: Did Surveyor, mining With [employer] Provider?: No  Clinician View of Client Situation: Clinician View Of Client Situation: Arrived to client's home. Stephanie Espinoza was sitting at kitchen table. Two cats and 1 dog present. Stephanie Espinoza ambulates independently with cane. Home neat. Area rugs in some areas of the home. Client's View of His/Her Situation: Client View Of His/Her Situation: Stephanie Espinoza reports she is doing well overall. Denies pain at the time. Denies any recent falls. States she will like to go outside more when the weather permits. [employer] has not done assestment yet.  Medication Assessment: Do You Have Any Problems Paying For Medications?: Yes (recent new insurance- uses [employer] for medication assistance) Where Does Client Store Medications?: Other: (in bedroom on dresser) Can Client Read Pill Bottles?:  Yes Does Client Use A Pillbox?: No Does Anyone Assist Client In Taking Medications?: No Do You Take Vitamin D?: Yes Total Number Of Medications That The Client Takes: 14 Does Client Have Any Questions Or Concerns About Medictions?: No Is Client Complaining Of Any Symptoms That Could Be Side Effects To Medications?: No Any Possible Changes In Medication Regimen?: No  OT Update: Pending [employer]' contracts and assessments  Session Summary: Stephanie Espinoza is delightful and engaging. She looks for things to do to keep her busy and preoccupied. She finds joy in her cat and dog. Stephanie Espinoza denies any falls. Stephanie Espinoza follows up closely with her physicians and is very knowledgeable.  Goals Addressed               This Visit's Progress     AG RN (pt-stated)

## 2024-01-06 NOTE — Patient Instructions (Signed)
 Visit Information  Thank you for taking time to visit with me today. Please don't hesitate to contact me if I can be of assistance to you before our next scheduled home appointment.  Following are the goals we discussed today:  (Copy and paste patient goals from clinical care plan here)  Our next appointment is on April 8 at 12 noon.  If you are experiencing a Mental Health or Behavioral Health Crisis or need someone to talk to, please call the Suicide and Crisis Lifeline: 988 call the [place] National Suicide Prevention Lifeline: 857-664-8410 or TTY: 780-001-2496 TTY 904 455 7541) to talk to a trained counselor call 1-800-273-TALK (toll free, 24 hour hotline) go to [facility] [address], [place] [phone]) call 911   The patient verbalized understanding of instructions, educational materials, and care plan provided today and agreed to receive a mailed copy of patient instructions, educational materials, and care plan.   Goals Addressed               This Visit's Progress     AG RN (pt-stated)        01/06/24  Assessment: Stephanie Espinoza reports she will like to get outside more. Reports she will like to walk around the neighborhood when the weather gets warmer. Reports having a cell phone pouch to place around her neck and a smart watch for emergencies when walking outside. 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 Stephanie Espinoza has rollator, walker, and cane for ambulation. Uses her cane most days.  Interventions: Encouraged Stephanie Espinoza to ask neighbors and/or daughter to walk outside with her. Encouraged Stephanie Espinoza not to walk outside alone. Encouraged Stephanie Espinoza to call [facility] (closest to her house) to see if they offer senior exercise programs similar to the other centers. Provided calendar book, writer's contact information, and [facility] pamphlet.  Plan: Scheduled next home visit for 02/10/24 at 12  noon.   Raiford Noble, MSN, RN, BSN [place]  [facility], Healthy Communities RN Case Manager for Aging Gracefully Direct Dial: [phone]        CLIENT/RN ACTION PLAN - GENERIC - (smartphrase AGRNGENERIC)  Registered Nurse:  Raiford Noble, RN Date: 01/06/2024  Client Name: Stephanie Espinoza Client ID:    Target Area:  Luvenia Heller   Why Problem May Occur:  When not feeling well enough or independent enough to go outside    Target Goal:  To go outside and walk (short distances) with someone else, for 1-2 times a week over the next 160 days.    STRATEGIES Coping Strategies: Ideas  Exercise/Activity Reviewed 01/06/24 Aging Gracefully exercises, Silver Sneaker program, senior group exercises at the recreation center, walking outside (short distances)  Involving others with exercises Reviewed 01/06/24 Asking family/neighbors to participate with walks     Safety Reviewed 01/06/24 Use cane when walking outside, wear phone in pouch around neck, do not walk outside alone. Walk in short increments.        Prevention Ideas                  PRACTICE It is important to practice the strategies so we can determine if they will be effective in helping to reach the goal.    Follow these specific recommendations:        If strategy does not work the first time, try it again.     We may make some changes over the next few sessions.  Raiford Noble, MSN, RN, BSN [place]  [facility], Healthy Communities RN Case Manager for Aging Gracefully Direct Dial: [phone]                                      Raiford Noble, MSN, RN, BSN [place]  [facility], Healthy Communities RN Case Manager for Aging Gracefully Direct Dial: [phone]

## 2024-01-07 LAB — CBC WITH DIFFERENTIAL/PLATELET
Absolute Lymphocytes: 1128 {cells}/uL (ref 850–3900)
Absolute Monocytes: 295 {cells}/uL (ref 200–950)
Basophils Absolute: 50 {cells}/uL (ref 0–200)
Basophils Relative: 1.6 %
Eosinophils Absolute: 93 {cells}/uL (ref 15–500)
Eosinophils Relative: 3 %
HCT: 38.5 % (ref 35.0–45.0)
Hemoglobin: 12.3 g/dL (ref 11.7–15.5)
MCH: 29.2 pg (ref 27.0–33.0)
MCHC: 31.9 g/dL — ABNORMAL LOW (ref 32.0–36.0)
MCV: 91.4 fL (ref 80.0–100.0)
MPV: 10.4 fL (ref 7.5–12.5)
Monocytes Relative: 9.5 %
Neutro Abs: 1535 {cells}/uL (ref 1500–7800)
Neutrophils Relative %: 49.5 %
Platelets: 237 10*3/uL (ref 140–400)
RBC: 4.21 10*6/uL (ref 3.80–5.10)
RDW: 12.7 % (ref 11.0–15.0)
Total Lymphocyte: 36.4 %
WBC: 3.1 10*3/uL — ABNORMAL LOW (ref 3.8–10.8)

## 2024-01-07 LAB — COMPLETE METABOLIC PANEL WITH GFR
AG Ratio: 1.1 (calc) (ref 1.0–2.5)
ALT: 11 U/L (ref 6–29)
AST: 15 U/L (ref 10–35)
Albumin: 3.9 g/dL (ref 3.6–5.1)
Alkaline phosphatase (APISO): 94 U/L (ref 37–153)
BUN/Creatinine Ratio: 13 (calc) (ref 6–22)
BUN: 16 mg/dL (ref 7–25)
CO2: 26 mmol/L (ref 20–32)
Calcium: 9.4 mg/dL (ref 8.6–10.4)
Chloride: 105 mmol/L (ref 98–110)
Creat: 1.22 mg/dL — ABNORMAL HIGH (ref 0.50–1.05)
Globulin: 3.6 g/dL (ref 1.9–3.7)
Glucose, Bld: 104 mg/dL — ABNORMAL HIGH (ref 65–99)
Potassium: 3.7 mmol/L (ref 3.5–5.3)
Sodium: 141 mmol/L (ref 135–146)
Total Bilirubin: 0.6 mg/dL (ref 0.2–1.2)
Total Protein: 7.5 g/dL (ref 6.1–8.1)
eGFR: 49 mL/min/{1.73_m2} — ABNORMAL LOW (ref >=60)

## 2024-01-07 LAB — RPR: RPR Ser Ql: NONREACTIVE

## 2024-01-07 LAB — HIV-1 RNA QUANT-NO REFLEX-BLD
HIV 1 RNA Quant: NOT DETECTED {copies}/mL
HIV-1 RNA Quant, Log: NOT DETECTED {Log_copies}/mL

## 2024-01-12 ENCOUNTER — Other Ambulatory Visit: Payer: Self-pay

## 2024-01-12 ENCOUNTER — Ambulatory Visit (INDEPENDENT_AMBULATORY_CARE_PROVIDER_SITE_OTHER): Payer: BC Managed Care – PPO | Admitting: Internal Medicine

## 2024-01-12 ENCOUNTER — Encounter: Payer: Self-pay | Admitting: Internal Medicine

## 2024-01-12 VITALS — BP 175/102 | HR 79 | Temp 98.0°F | Ht 62.0 in | Wt 177.0 lb

## 2024-01-12 DIAGNOSIS — R140 Abdominal distension (gaseous): Secondary | ICD-10-CM | POA: Diagnosis not present

## 2024-01-12 DIAGNOSIS — Z79899 Other long term (current) drug therapy: Secondary | ICD-10-CM | POA: Diagnosis not present

## 2024-01-12 DIAGNOSIS — B20 Human immunodeficiency virus [HIV] disease: Secondary | ICD-10-CM

## 2024-01-12 MED ORDER — JULUCA 50-25 MG PO TABS: 1.0000 | ORAL_TABLET | Freq: Every day | ORAL | 11 refills | Status: DC

## 2024-01-12 NOTE — Progress Notes (Signed)
 Patient ID: Stephanie Espinoza, female   DOB: Sep 17, 1956, 68 y.o.   MRN: [id number]  HPI Stephanie Espinoza is a 27yo F with advanced HIV disease, currently On biktarvy and bactrim Not sleeping well- started 6 months ago ( Bloating-upset - nauseated in the morning Constipation-diarrhea -- irritable bowel syndrome Dry mouth Hair loss Decreased taste Outpatient Encounter Medications as of 01/12/2024  Medication Sig   acyclovir ointment (ZOVIRAX) 5 % APPLY TOPICALLY EVERY 3 (THREE) HOURS. USE AS NEEDED FOR SYMPTOMS RELIEF FOR NO MORE THAN 7 DAYS.   albuterol (VENTOLIN HFA) 108 (90 Base) MCG/ACT inhaler INHALE 1-2 PUFFS INTO THE LUNGS EVERY 4 HOURS AS NEEDED FOR WHEEZING OR SHORTNESS OF BREATH.   amitriptyline (ELAVIL) 10 MG tablet Take 10 mg by mouth at bedtime.   bictegravir-emtricitabine-tenofovir AF (BIKTARVY) 50-200-25 MG TABS tablet Take 1 tablet by mouth daily.   cholecalciferol (VITAMIN D3) 25 MCG (1000 UNIT) tablet Take 1,000 Units by mouth daily. gummie   cyclobenzaprine (FLEXERIL) 5 MG tablet TAKE 1 TABLET BY MOUTH THREE TIMES A DAY AS NEEDED FOR MUSCLE SPASM   EPINEPHrine (EPIPEN 2-PAK) 0.3 mg/0.3 mL IJ SOAJ injection Inject 0.3 mg into the muscle as needed for anaphylaxis.   fluticasone (FLONASE) 50 MCG/ACT nasal spray Place 2 sprays into both nostrils daily.   gabapentin (NEURONTIN) 100 MG capsule Take 100 mg by mouth at bedtime.   lidocaine (LIDODERM) 5 % Place 1 patch onto the skin daily. Remove & Discard patch within 12 hours or as directed by MD   Multiple Vitamin (MULTIVITAMIN) tablet Take 2 tablets by mouth daily. gummies   sulfamethoxazole-trimethoprim (BACTRIM DS) 800-160 MG tablet TAKE 1 TABLET BY MOUTH EVERY DAY   traMADol (ULTRAM) 50 MG tablet Take 50 mg by mouth every 4 (four) hours as needed.   valACYclovir (VALTREX) 500 MG tablet Take 500 mg by mouth 2 (two) times daily. TAKES AS PRN   valsartan (DIOVAN) 40 MG tablet Take 1 tablet (40 mg total) by mouth daily.   No  facility-administered encounter medications on file as of 01/12/2024.     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   Right foot pain 05/22/2022   Right lumbar radiculopathy 05/08/2022   DDD (degenerative disc disease), lumbar 02/12/2022   Anterolisthesis of lumbosacral spine 02/12/2022   Weakness of both legs 03/07/2021   B12 deficiency 03/07/2021   Urinary incontinence 05/16/2020   Other fatigue 05/16/2020   History of syphilis 05/14/2020   Memory loss 02/14/2020   Insomnia 02/14/2020   Herpes zoster 12/31/2019   Normocytic anemia 03/01/2019   Hyperglycemia 03/01/2019   Headache 06/30/2018   Urinary frequency 05/01/2018   Cervical 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 (HPV) DNA test positive 10/18/2016   Essential hypertension 09/16/2014   Vasomotor rhinitis 05/05/2014   Benign paroxysmal positional vertigo 09/21/2012   Sinusitis 03/22/2009   Human immunodeficiency virus (HIV) disease (HCC) 09/06/2006   Genital herpes 09/06/2006   Depression 09/06/2006     Health Maintenance Due  Topic Date Due   COVID-19 Vaccine (5 - 2024-25 season) 07/06/2023   MAMMOGRAM  08/03/2023     Review of Systems See above Physical Exam   BP (!) 175/102   Pulse 79   Temp 98 F (36.7 C) (Oral)   Ht 5\' 2"  (1.575 m)   Wt 177 lb (  80.3 kg)   LMP 11/04/1997   SpO2 98%   BMI 32.37 kg/m   Physical Exam  Constitutional:  oriented to person, place, and time. appears well-developed and well-nourished. No distress.  HENT: [place]/AT, PERRLA, no scleral icterus Mouth/Throat: Oropharynx is clear and moist. No oropharyngeal exudate.  Cardiovascular: Normal rate, regular rhythm and normal heart sounds. Exam reveals no gallop and no friction rub.  No murmur heard.  Pulmonary/Chest: Effort normal and breath sounds  normal. No respiratory distress.  has no wheezes.  Neck = supple, no nuchal rigidity Abdominal: Soft. Bowel sounds are normal.  exhibits no distension. There is no tenderness.  Lymphadenopathy: no cervical adenopathy. No axillary adenopathy Neurological: alert and oriented to person, place, and time.  Skin: Skin is warm and dry. No rash noted. No erythema.  Psychiatric: a normal mood and affect.  behavior is normal.   Lab Results  Component Value Date   CD4TCELL 12 (L) 01/05/2024   Lab Results  Component Value Date   CD4TABS 129 (L) 01/05/2024   CD4TABS 111 (L) 10/06/2023   CD4TABS 107 (L) 08/04/2023   Lab Results  Component Value Date   HIV1RNAQUANT Not Detected 01/05/2024   Lab Results  Component Value Date   HEPBSAB No 12/29/2006   Lab Results  Component Value Date   LABRPR NON-REACTIVE 01/05/2024    CBC Lab Results  Component Value Date   WBC 3.1 (L) 01/05/2024   RBC 4.21 01/05/2024   HGB 12.3 01/05/2024   HCT 38.5 01/05/2024   PLT 237 01/05/2024   MCV 91.4 01/05/2024   MCH 29.2 01/05/2024   MCHC 31.9 (L) 01/05/2024   RDW 12.7 01/05/2024   LYMPHSABS 1,389 08/04/2023   MONOABS 0.2 05/11/2022   EOSABS 93 01/05/2024    BMET Lab Results  Component Value Date   NA 141 01/05/2024   K 3.7 01/05/2024   CL 105 01/05/2024   CO2 26 01/05/2024   GLUCOSE 104 (H) 01/05/2024   BUN 16 01/05/2024   CREATININE 1.22 (H) 01/05/2024   CALCIUM 9.4 01/05/2024   GFRNONAA >60 05/11/2022   GFRAA >60 03/03/2019      Assessment and Plan HIV disease= plan to see if can change her ART regimen to see if that could help some of her symptoms, Let's try juluca and then 4-6 wk. Will repeat VL at that time  Long term medicaiton management = cr is stable  Bloating = recommend simethicone

## 2024-01-13 ENCOUNTER — Encounter: Payer: Self-pay | Admitting: Internal Medicine

## 2024-01-18 ENCOUNTER — Encounter: Payer: Self-pay | Admitting: Internal Medicine

## 2024-01-20 ENCOUNTER — Other Ambulatory Visit (HOSPITAL_COMMUNITY): Payer: Self-pay

## 2024-01-30 ENCOUNTER — Other Ambulatory Visit: Payer: Self-pay | Admitting: Rehabilitation

## 2024-01-31 NOTE — Patient Outreach (Signed)
 Aging Gracefully Program  OT Follow-Up Visit  01/31/2024  Stephanie Espinoza 1956-04-27 [id number]  Visit:  2- Second Visit  Start Time:  1400 End Time:  1435 Total Minutes:  35  Readiness to Change Score :  Readiness to Change Score: 9.67  Durable Medical Equipment: Adaptive Equipment: Reacher Adaptive Equipment Distribution Date: 01/30/24 (Issued a Sports administrator)  Patient Education: Education Provided: Yes Education Details: How to get up from a fall, uses of the reacher, reviewed grab bar placement for bathtub Person(s) Educated: Patient Comprehension: Verbalized Understanding, Returned Demonstration  Goals:   Goals Addressed             This Visit's Progress    Patient Stated   On track    Improve reaching safety 01/31/24  OT Action Plan  Target Problem Area:  Reducing need to bend over to pick up from the floor   Why Problem May Occur:     Neuropathy of feet  Loss of balance  Loss/impaired body awareness         Target Goal(s):   Practice using reacher to reduce bending over to pick up items from the floor   STRATEGIES   Saving your Energy DO:  Plan ahead  Take breaks before you get tired   Monitor your response to exercise/movement and LE cramps              Change your home to make it safe for you    DO:  Already wearing comfortable shoes  Try using the reacher to see how it can assist you   Awaiting modifications to add grab bars for improved safety           PRACTICE  Based on what we have talked about, you are willing to try:  __Using the reacher to pick up items from the floor  ________________________________________________________________  If an idea does not work the first time, try it again.  We may make some changes over the next few sessions, based on how they work.            Post Clinical Reasoning: Client Action (Goal) One Interventions: Awaiting construction. Adding grab bars to assist safety with  transitions and standing balance Client Action (Goal) Two Interventions: Not yet addressed. Will be addressed through construction/CHS and addition of grab bars. Client Action (Goal) Three Interventions: Issued a reacher. Able to manage to improve safety and reduce bending down to pick up items. Did Client Try?: Yes Targeted Problem Area Status: A Little Better Clinician View Of Client Situation:: Client is receptive to ideas and also identifies ideas. Uses technology well and has 2 options to use if a fall occurs at home (smart watch and Echo). Would like more support and information regarding Neuropahty of feet as this is the biggest concern for her safety and falls. Client View Of His/Her Situation:: Verbalizes excellent awareness of her LE Neuropathy and limitations. When reviewing how to get up from a fall she identifies the strategy that works for her to get up from the floor. Is starting to take note of how exercise impacts her LE cramps and function the next day. Next Visit Plan:: OT to return once construction is alomost complete to practice use of grab bars and safety.

## 2024-02-03 ENCOUNTER — Encounter: Payer: Self-pay | Admitting: Internal Medicine

## 2024-02-03 ENCOUNTER — Ambulatory Visit (INDEPENDENT_AMBULATORY_CARE_PROVIDER_SITE_OTHER): Admitting: Internal Medicine

## 2024-02-03 VITALS — BP 180/100 | HR 92 | Temp 98.1°F | Ht 62.0 in | Wt 179.0 lb

## 2024-02-03 DIAGNOSIS — I10 Essential (primary) hypertension: Secondary | ICD-10-CM

## 2024-02-03 NOTE — Assessment & Plan Note (Signed)
 Moderately elevated BP today. Increasing valsartan to 80 mg daily. Follow up in 2-3 weeks with BP readings. Recent labs stable. EKG done and without changes today.

## 2024-02-03 NOTE — Patient Instructions (Signed)
 We will have you take 2 pills of the valsartan and see if this brings down the BP.  We will get the ultrasound of legs and kidneys to check the blood flow.

## 2024-02-03 NOTE — Progress Notes (Signed)
   Subjective:   Patient ID: Stephanie Espinoza, female    DOB: Mar 13, 1956, 68 y.o.   MRN: [id number]  HPI The patient is a 68 YO female coming in for high BP recently. Started a few weeks ago at ID visit. Checked at home and consistently high. Denies medication, diet, lifestyle, illness or other change recently. Has not missed valsartan.  Review of Systems  Constitutional: Negative.   HENT: Negative.    Eyes: Negative.   Respiratory:  Negative for cough, chest tightness and shortness of breath.   Cardiovascular:  Negative for chest pain, palpitations and leg swelling.  Gastrointestinal:  Negative for abdominal distention, abdominal pain, constipation, diarrhea, nausea and vomiting.  Musculoskeletal: Negative.   Skin: Negative.   Neurological:  Positive for headaches.  Psychiatric/Behavioral: Negative.      Objective:  Physical Exam Constitutional:      Appearance: She is well-developed.  HENT:     Head: Normocephalic and atraumatic.  Cardiovascular:     Rate and Rhythm: Normal rate and regular rhythm.  Pulmonary:     Effort: Pulmonary effort is normal. No respiratory distress.     Breath sounds: Normal breath sounds. No wheezing or rales.  Abdominal:     General: Bowel sounds are normal. There is no distension.     Palpations: Abdomen is soft.     Tenderness: There is no abdominal tenderness. There is no rebound.  Musculoskeletal:     Cervical back: Normal range of motion.  Skin:    General: Skin is warm and dry.  Neurological:     Mental Status: She is alert and oriented to person, place, and time.     Coordination: Coordination normal.     Vitals:   02/03/24 1038 02/03/24 1043  BP: (!) 180/100 (!) 180/100  Pulse: 92   Temp: 98.1 F (36.7 C)   TempSrc: Oral   SpO2: 97%   Weight: 179 lb (81.2 kg)   Height: 5\' 2"  (1.575 m)    EKG: Rate 63, axis left, interval normal, sinus, minimal criteria for LVH, no st or t wave changes, no significant change compared to prior  10/2023   Assessment & Plan:

## 2024-02-09 NOTE — Telephone Encounter (Signed)
 Please advise pt was having side affects to medication

## 2024-02-10 ENCOUNTER — Encounter: Payer: Self-pay | Admitting: *Deleted

## 2024-02-10 ENCOUNTER — Other Ambulatory Visit: Payer: Self-pay | Admitting: *Deleted

## 2024-02-10 DIAGNOSIS — I10 Essential (primary) hypertension: Secondary | ICD-10-CM

## 2024-02-10 NOTE — Addendum Note (Signed)
 Addended by: Ladean Raya on: 02/10/2024 04:41 PM   Modules accepted: Orders

## 2024-02-10 NOTE — Patient Outreach (Signed)
 Aging Gracefully Program  RN Visit  02/10/2024  TIDA SANER 1956/07/03 [id number]  Visit:  RN Visit Number: 2- Second Visit  RN TIME CALCULATION: Start TIme:  RN Start Time Calculation: 1200 End Time:  RN Stop Time Calculation: 1340 Total Minutes:  RN Time Calculation: 100  Readiness To Change Score:  Readiness to Change Score: 9  Universal RN Interventions: Calendar Distribution: No Exercise Review: Yes Medications: Yes Medication Changes: Yes Mood: Yes Pain: Yes PCP Advocacy/Support: Yes Fall Prevention: Yes Incontinence: Yes Clinician View Of Client Situation: Arrived for home visit. Ms. Donofrio answered door. Ambulating independently without assistive device. Small dog at door. Small rug noted at front door as well. 1 dog and 2 cats present. Home neat without clutter. Client View Of His/Her Situation: Ms. Lamoreaux denies any recent falls. States her blood pressure has been running high. Reports being in contact with her PCP regarding her blood pressure. States Amlodipine has been added to her medication regimen.  Healthcare Provider Communication: Did Surveyor, mining With [employer] Provider?: Yes Method Of Communication: Other: (secure chat) Healthcare Provider Response According to RN: "thank you for the information." According to Client, Did PCP Report Communication With An Aging Gracefully RN?: No Healthcare Provider Response According To Client: n/a  Clinician View of Client Situation: Clinician View Of Client Situation: Arrived for home visit. Ms. Suppa answered door. Ambulating independently without assistive device. Small dog at door. Small rug noted at front door as well. 1 dog and 2 cats present. Home neat without clutter. Client's View of His/Her Situation: Client View Of His/Her Situation: Ms. Benbrook denies any recent falls. States her blood pressure has been running high. Reports being in contact with her PCP regarding her blood pressure. States Amlodipine has  been added to her medication regimen.  Medication Assessment: Reviewed. Requests Pharmacy referral for medication review for drug interactions    OT Update: Pending [employer] assessments and contracts.  Session Summary: Ms. Biasi primary concern today was her elevated blood pressure. Writer has advised Ms. Patchen to go to ED. Ms. Sugarman denied SOB, HA, dizziness, or chest pain during home visit. Stated numbness in her LE is her baseline due to neuropathy.    Goals Addressed               This Visit's Progress     AG RN (pt-stated)        01/06/24  Assessment: Ms. Faciane reports she will like to get outside more. Reports she will like to walk around the neighborhood when the weather gets warmer. Reports having a cell phone pouch to place around her neck and a smart watch for emergencies when walking outside. 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 Ms. Beecham has rollator, walker, and cane for ambulation. Uses her cane most days.  Interventions: Encouraged Ms. Wengert to ask neighbors and/or daughter to walk outside with her. Encouraged Ms. Aguinaldo not to walk outside alone. Encouraged Ms. Ringer to call [facility] (closest to her house) to see if they offer senior exercise programs similar to the other centers. Provided calendar book, writer's contact information, and [place] [facility] pamphlet.  Plan: Scheduled next home visit for 02/10/24 at 12 noon.   Raiford Noble, MSN, RN, BSN [place]  [facility], Healthy Communities RN Case Manager for Aging Gracefully Direct Dial: [phone]     CLIENT/RN ACTION PLAN - GENERIC - (smartphrase AGRNGENERIC)  Registered Nurse:  Raiford Noble, RN Date: 01/06/2024  Client Name: Sharaine Delange Client ID:    Target Area:  GENERIC   Why Problem May Occur:  When not feeling well enough or independent enough to go outside    Target Goal:  To go outside and walk (short distances) with someone else,  for 1-2 times a week over the next 160 days.    STRATEGIES Coping Strategies: Ideas  Exercise/Activity Reviewed 01/06/24 Reviewed 02/10/24 (exercise booklet) Aging Gracefully exercises, Silver Sneaker program, senior group exercises at the recreation center, walking outside (short distances)  Involving others with exercises Reviewed 01/06/24 Reviewed 02/10/24 Asking family/neighbors to participate with walks     Safety Reviewed 01/06/24 Reviewed 02/10/24 Use cane when walking outside, wear phone in pouch around neck, do not walk outside alone. Walk in short increments.        Prevention Ideas                  PRACTICE It is important to practice the strategies so we can determine if they will be effective in helping to reach the goal.    Follow these specific recommendations:        If strategy does not work the first time, try it again.     We may make some changes over the next few sessions.      Raiford Noble, MSN, RN, BSN [facility], Healthy Communities RN Case Manager for Aging Gracefully Direct Dial: [phone]    02/10/24  Assessment:  Ms .Min reports walking outside once a week now. States she uses her cane and has smart watch on her wrist that she can use to call for emergencies. Denies any recent falls. Reports pain is 7 out of 10. Does not like taking pain medication. States she usually takes pain medication at night before bed. Reports having history of side effects and reactions from various medications. States she looks up drug interactions and side effects on line to stay informed. Reports having high blood pressure as of late. Ms. Shadowens used her own blood pressure machine to take her own blood pressure while writer was in the home. Her BP was 214/116;  She took her Amlodipine before writer left the home. About 1.5 later, self reported BP was 199/123. Ms. Maggio denied SOB, HA, CP, or dizziness.   Interventions: Writer performed home  exercises as Ms. Iona Hansen watched. Advised Ms. Mcjunkin not to demonstrate exercises at the time due to elevated blood pressure. Provided home exercise booklet. Discussed [facility] complex care management referral for blood pressure management and referral to Pharmacy for medication review interactions. Ms. Racette agreeable to the referrals. Discussed Clinical research associate sending EMMI videos and literature about HTN and circulatory issues per her request.  Advised Ms. Reasner to go to the ED for ongoing high blood pressure readings. Ms. Emmer declined going to ED. She stated she will consider going but wants to monitor herself for now. Writer sent secure chat to Dr. Okey Dupre to make aware of Ms. Keithley's blood pressure readings of 214/116 and 199/123 and that she took her Amlodipine.   Plan: Will make referral to [facility] CCM and will request CCM team to make referral for Pharmacy for medication interactions and review. Scheduled next home visit for May 1st at 12 noon.   Raiford Noble, MSN, RN, BSN [place]  [facility], Healthy Communities RN Case Manager for Aging Gracefully Direct Dial: [phone]  Raiford Noble, MSN, RN, BSN   [facility], Healthy Communities RN Case Manager for Aging Gracefully Direct Dial: [phone]

## 2024-02-10 NOTE — Patient Instructions (Signed)
 Visit Information  Thank you for taking time to visit with me today. Please don't hesitate to contact me if I can be of assistance to you before our next scheduled home appointment.  Following are the goals we discussed today:  (Copy and paste patient goals from clinical care plan here)  Our next appointment is on May 1st at 12 noon.  If you are experiencing a Mental Health or Behavioral Health Crisis or need someone to talk to, please call the Suicide and Crisis Lifeline: 988 call the [place] National Suicide Prevention Lifeline: 3060016873 or TTY: (970)767-7311 TTY (908)028-0039) to talk to a trained counselor call 1-800-273-TALK (toll free, 24 hour hotline) go to [facility] [address], [place] [phone]) call 911   The patient verbalized understanding of instructions, educational materials, and care plan provided today and agreed to receive a mailed copy of patient instructions, educational materials, and care plan.    Goals Addressed               This Visit's Progress     AG RN (pt-stated)        01/06/24  Assessment: Stephanie Espinoza reports she will like to get outside more. Reports she will like to walk around the neighborhood when the weather gets warmer. Reports having a cell phone pouch to place around her neck and a smart watch for emergencies when walking outside. 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 Stephanie Espinoza has rollator, walker, and cane for ambulation. Uses her cane most days.  Interventions: Encouraged Stephanie Espinoza to ask neighbors and/or daughter to walk outside with her. Encouraged Stephanie Espinoza not to walk outside alone. Encouraged Stephanie Espinoza to call [employer] (closest to her house) to see if they offer senior exercise programs similar to the other centers. Provided calendar book, writer's contact information, and [facility] pamphlet.  Plan: Scheduled next home visit for 02/10/24 at 12  noon.   Stephanie Noble, MSN, RN, BSN [place]  [facility], [facility] Commun[facility] RN Case Manager for Aging [facility] Direct Dial: [phone]     CLIENT/RN ACTION PLAN - GENERIC - (smartphrase AGRNGENERIC)  Registered Nurse:  Stephanie Noble, RN Date: 01/06/2024  Client Name: Stephanie Espinoza Client ID:    Target Area:  [place]venia Heller   Why Problem May Occur:  When not feeling well enough or independent enough to go outside    Target Goal:  To go outside and walk (short distances) with someone else, for 1-2 times a week over the next 160 days.    STRATEGIES Coping Strategies: Ideas  Exercise/Activity Reviewed 01/06/24 Reviewed 02/10/24 (exercise booklet) Aging Gracefully exercises, [employer] program, senior group exercises at the recreation center, walking outside (short distances)  Involving others with exercises Reviewed 01/06/24 Reviewed 02/10/24 Asking family/neighbors to participate with walks     Safety Reviewed 01/06/24 Reviewed 02/10/24 Use cane when walking outside, wear phone in pouch around neck, do not walk outside alone. Walk in short increments.        Prevention Ideas                  PRACTICE It is important to practice the strategies so we can determine if they will be effective in helping to reach the goal.    Follow these specific recommendations:        If strategy does not work the first time, try it again.     We may make some  changes over the next few sessions.      Stephanie Noble, MSN, RN, BSN [facility], [facility] Commun[facility] RN Case Manager for Aging [facility] Direct Dial: [phone]    02/10/24  Assessment:  [name] reports walking outside once a week now. States she uses her cane and has smart watch on her wrist that she can use to call for emergencies. Denies any recent falls. Reports pain is 7 out of 10. Does not like taking pain medication. States she usually takes pain medication at night before bed.  Reports having history of side effects and reactions from various medications. States she looks up drug interactions and side effects on line to stay informed. Reports having high blood pressure as of late. Stephanie Espinoza used her own blood pressure machine to take her own blood pressure while writer was in the home. Her BP was 214/116;  She took her Amlodipine before writer left the home. About 1.5 later, self reported BP was 199/123. Stephanie Espinoza denied SOB, HA, CP, or dizziness.   Interventions: Writer performed home exercises as Stephanie Espinoza watched. Advised Stephanie Espinoza not to demonstrate exercises at the time due to elevated blood pressure. Provided home exercise booklet. Discussed VBCI complex care management referral for blood pressure management and referral to Pharmacy for medication review interactions. Stephanie Espinoza agreeable to the referrals. Discussed Clinical research associate sending EMMI videos and literature about HTN and circulatory issues per her request.  Advised Stephanie Espinoza to go to the ED for ongoing high blood pressure readings. Stephanie Espinoza declined going to ED. She stated she will consider going but wants to monitor herself for now. Writer sent secure chat to Dr. Okey Dupre to make aware of Stephanie Espinoza blood pressure readings of 214/116 and 199/123 and that she took her Amlodipine.   Plan: Will make referral to [facility] CCM and will request CCM team to make referral for Pharmacy for medication interactions and review. Scheduled next home visit for May 1st at 12 noon.   Stephanie Noble, MSN, RN, BSN [place]  [facility], Healthy Communities RN Case Manager for Aging [facility] Direct Dial: [phone]

## 2024-02-12 ENCOUNTER — Telehealth: Payer: Self-pay

## 2024-02-12 ENCOUNTER — Telehealth: Payer: Self-pay | Admitting: *Deleted

## 2024-02-12 ENCOUNTER — Other Ambulatory Visit (HOSPITAL_BASED_OUTPATIENT_CLINIC_OR_DEPARTMENT_OTHER): Payer: Self-pay | Admitting: Obstetrics & Gynecology

## 2024-02-12 ENCOUNTER — Other Ambulatory Visit: Payer: Self-pay

## 2024-02-12 DIAGNOSIS — B20 Human immunodeficiency virus [HIV] disease: Secondary | ICD-10-CM

## 2024-02-12 NOTE — Progress Notes (Signed)
 Complex Care Management Note Care Guide Note  02/12/2024 Name: Stephanie Espinoza MRN: [id number] DOB: 25-Nov-1955   Complex Care Management Outreach Attempts: An unsuccessful telephone outreach was attempted today to offer the patient information about available complex care management services.  Follow Up Plan:  Additional outreach attempts will be made to offer the patient complex care management information and services.   Encounter Outcome:  No Answer  Burman Nieves, CMA [place]  [facility], [facility] Guide Direct Dial: [phone]  Fax: [phone] Website: [email or web address]

## 2024-02-12 NOTE — Telephone Encounter (Signed)
 Patient has rescheduled office visit to June, would like a to know if she needs to schedule a lab appointment 2 weeks before her office visit to check on new medication or will it be ok to do labs same day as her office visit. Best contact number is [phone]

## 2024-02-12 NOTE — Progress Notes (Signed)
 Complex Care Management Note  Care Guide Note 02/12/2024 Name: Stephanie Espinoza MRN: [id number] DOB: 1956/10/02  Stephanie Espinoza is a 68 y.o. year old female who sees Myrlene Broker, MD for primary care. I reached out to Hilary Hertz by phone today to offer complex care management services.  [name] was given information about Complex Care Management services today including:   The Complex Care Management services include support from the care team which includes your Nurse Care Manager, Clinical Social Worker, or Pharmacist.  The Complex Care Management team is here to help remove barriers to the health concerns and goals most important to you. Complex Care Management services are voluntary, and the patient may decline or stop services at any time by request to their care team member.   Complex Care Management Consent Status: Patient agreed to services and verbal consent obtained.   Follow up plan:  Telephone appointment with complex care management team member scheduled for:  02/13/2024 and 03/05/2024  Encounter Outcome:  Patient Scheduled  Burman Nieves, CMA [place]  [facility], [facility] Guide Direct Dial: [phone]  Fax: [phone] Website: [email or web address]

## 2024-02-12 NOTE — Telephone Encounter (Signed)
 Patient scheduled for labs on 5/27 and will follow up with Dr.Snider on 6/4.

## 2024-02-13 ENCOUNTER — Other Ambulatory Visit: Payer: Self-pay

## 2024-02-13 DIAGNOSIS — I10 Essential (primary) hypertension: Secondary | ICD-10-CM | POA: Diagnosis not present

## 2024-02-13 DIAGNOSIS — B20 Human immunodeficiency virus [HIV] disease: Secondary | ICD-10-CM | POA: Diagnosis not present

## 2024-02-13 DIAGNOSIS — H269 Unspecified cataract: Secondary | ICD-10-CM | POA: Diagnosis not present

## 2024-02-13 DIAGNOSIS — E669 Obesity, unspecified: Secondary | ICD-10-CM | POA: Diagnosis not present

## 2024-02-13 DIAGNOSIS — Z8249 Family history of ischemic heart disease and other diseases of the circulatory system: Secondary | ICD-10-CM | POA: Diagnosis not present

## 2024-02-13 DIAGNOSIS — G629 Polyneuropathy, unspecified: Secondary | ICD-10-CM | POA: Diagnosis not present

## 2024-02-13 DIAGNOSIS — Z87891 Personal history of nicotine dependence: Secondary | ICD-10-CM | POA: Diagnosis not present

## 2024-02-13 DIAGNOSIS — M62838 Other muscle spasm: Secondary | ICD-10-CM | POA: Diagnosis not present

## 2024-02-13 DIAGNOSIS — Z833 Family history of diabetes mellitus: Secondary | ICD-10-CM | POA: Diagnosis not present

## 2024-02-13 NOTE — Patient Outreach (Signed)
 Complex Care Management   Visit Note  02/13/2024  Name:  Stephanie Espinoza MRN: [id number] DOB: 11-13-1955  Situation: Referral received for Complex Care Management related to  HTN  I obtained verbal consent from Patient.  Visit completed with patient  on the phone  Background:   Past Medical History:  Diagnosis Date   Abnormal Pap smear of cervix before 1995   always a repeat back to normal except for 1 time and had colpo bioosy -resutls were normal   Allergic rhinitis    Allergy    Anxiety    Bell's palsy after 1995 ?   no residual SE.   Depression    Genital herpes    History of drug abuse (HCC)    drug usser 1984 - 1990.  cocaine   HIV (human immunodeficiency virus infection) (HCC) 1990   from sexual contact who is unknown   Hypertension    Syphilis    treated    Assessment: Patient reports blood pressure this morning was 153/113. Patient rechecked blood pressure while on call with RNCM and reports blood pressure 148/96. She states it is the best reading she has had so far. Patient continues to monitor blood pressure and states will send updated readings to PCP today.  Patient Reported Symptoms:  Cognitive Cognitive Status: Alert and oriented to person, place, and time, Normal speech and language skills   Health Maintenance Behaviors: Annual physical exam, Healthy diet Health Facilitated by: Rest  Neurological   Neurological Conditions: Headache (patient reports intermittent and reports a side effect of some of the medications she takes is headache) Neurological Management Strategies: Medication therapy, Routine screening Neurological Self-Management Outcome: 3 (uncertain)  HEENT HEENT Symptoms Reported: No symptoms reported      Cardiovascular Cardiovascular Symptoms Reported: Fatigue Does patient have uncontrolled Hypertension?: Yes Is patient checking Blood Pressure at home?: Yes Patient's Recent BP reading at home: 148/96 at `1:26pm today Cardiovascular Conditions:  Hypertension Cardiovascular Management Strategies: Medication therapy, Diet modification  Respiratory Respiratory Symptoms Reported: No symptoms reported    Endocrine Is patient diabetic?: No    Gastrointestinal Gastrointestinal Symptoms Reported: Flatulence Additional Gastrointestinal Details: medication victarvy can cause diarrhe, flatuence. however no diarrheat at this time      Genitourinary      Integumentary Integumentary Symptoms Reported: No symptoms reported    Musculoskeletal Musculoskelatal Symptoms Reviewed: Difficulty walking Additional Musculoskeletal Details: due to "I cant feel my feet" Musculoskeletal Self-Management Outcome: 4 (good) Falls in the past year?: No    Psychosocial Psychosocial Symptoms Reported: Depression - if selected complete PHQ 2-9, Anxiety - if selected complete GAD Additional Psychological Details: Offered LCSW services for assistance with counseling/coping strategies. Patient declines at this time.     Do you feel physically threatened by others?: No      02/13/2024    1:39 PM  Depression screen PHQ 2/9  Decreased Interest 0  Down, Depressed, Hopeless 0  PHQ - 2 Score 0      02/13/2024    1:39 PM  GAD 7 : Generalized Anxiety Score  Nervous, Anxious, on Edge 1  Control/stop worrying 0  Worry too much - different things 1  Trouble relaxing 1  Restless 0  Easily annoyed or irritable 0  Afraid - awful might happen 0  Total GAD 7 Score 3  Anxiety Difficulty Not difficult at all   Vitals:   02/13/24 1314 02/13/24 1759  BP: (!) 153/113 (!) 148/96    Medications Reviewed Today  Reviewed by Colletta Maryland, RN (Registered Nurse) on 02/13/24 at 1319  Med List Status: <None>   Medication Order Taking? Sig Documenting Provider Last Dose Status Informant  acyclovir ointment (ZOVIRAX) 5 % [id number] Yes APPLY TOPICALLY EVERY 3 (THREE) HOURS. USE AS NEEDED FOR SYMPTOMS RELIEF FOR NO MORE THAN 7 DAYS. Letta Kocher, CNM Taking Active    albuterol (VENTOLIN HFA) 108 (90 Base) MCG/ACT inhaler [id number] Yes INHALE 1-2 PUFFS INTO THE LUNGS EVERY 4 HOURS AS NEEDED FOR WHEEZING OR SHORTNESS OF BREATH. Myrlene Broker, MD Taking Active   amitriptyline (ELAVIL) 10 MG tablet [id number] Yes Take 10 mg by mouth at bedtime. [provider] Taking Active   amLODipine (NORVASC) 5 MG tablet [id number] Yes Take 5 mg by mouth at bedtime. [provider] Taking Active   bictegravir-emtricitabine-tenofovir AF (BIKTARVY) 50-200-25 MG TABS tablet [id number] No Take 1 tablet by mouth daily.  Patient not taking: Reported on 02/13/2024   Judyann Munson, MD Not Taking Active   cholecalciferol (VITAMIN D3) 25 MCG (1000 UNIT) tablet [id number] Yes Take 1,000 Units by mouth daily. gummie [provider] Taking Active   cyclobenzaprine (FLEXERIL) 5 MG tablet [id number] Yes TAKE 1 TABLET BY MOUTH THREE TIMES A DAY AS NEEDED FOR MUSCLE SPASM Myrlene Broker, MD Taking Active   dolutegravir-rilpivirine (JULUCA) 50-25 MG tablet [id number] Yes Take 1 tablet by mouth daily before lunch. Judyann Munson, MD Taking Active   EPINEPHrine (EPIPEN 2-PAK) 0.3 mg/0.3 mL IJ SOAJ injection [id number] Yes Inject 0.3 mg into the muscle as needed for anaphylaxis. Etta Grandchild, MD Taking Active   fluticasone [facility]) 50 MCG/ACT nasal spray [id number] Yes Place 2 sprays into both nostrils daily. Myrlene Broker, MD Taking Active   gabapentin (NEURONTIN) 100 MG capsule [id number] Yes Take 100 mg by mouth at bedtime. [provider] Taking Active            Med Note Richardson Dopp, [facility] S   Tue Jul 08, 2023  8:21 AM) As needed  lidocaine (LIDODERM) 5 % [id number] Yes Place 1 patch onto the skin daily. Remove & Discard patch within 12 hours or as directed by MD Myrlene Broker, MD Taking Active   Multiple Vitamin (MULTIVITAMIN) tablet [id number] Yes Take 2 tablets by mouth daily. gummies [provider] Taking Active    sulfamethoxazole-trimethoprim (BACTRIM DS) 800-160 MG tablet [id number] Yes TAKE 1 TABLET BY MOUTH EVERY DAY Judyann Munson, MD Taking Active   traMADol (ULTRAM) 50 MG tablet [id number] Yes Take 50 mg by mouth every 4 (four) hours as needed. [provider] Taking Active   valACYclovir (VALTREX) 500 MG tablet [id number] Yes Take 500 mg by mouth 2 (two) times daily. TAKES AS PRN [provider] Taking Active   valsartan (DIOVAN) 40 MG tablet [id number] Yes Take 1 tablet (40 mg total) by mouth daily. Myrlene Broker, MD Taking Active           Recommendation:   PCP Follow-up Patient to provide update of BP readings to provider  Follow Up Plan:   Telephone follow-up within 1-2 weeks  Kathyrn Sheriff, RN, MSN, BSN, CCM [place]  [facility], Population Health Case Manager Phone: [phone]

## 2024-02-13 NOTE — Patient Instructions (Signed)
 Visit Information  Thank you for taking time to visit with me today. Please don't hesitate to contact me if I can be of assistance to you before our next scheduled appointment.  Our next appointment is by telephone on 02/27/24 at 10 am  Please call the care guide team at [phone] if you need to cancel or reschedule your appointment.   Following is a copy of your care plan:   Goals Addressed             This Visit's Progress    VBCI RN Care Plan       Problems:  Chronic Disease Management support and education needs related to HTN No Advanced Directives in place  Goal: Over the next 90 days the Patient will collaborate with the care management team towards completion of advanced directives and report receipt of packet as evidenced by patient report  take all medications exactly as prescribed and will call provider for medication related questions as evidenced by patient report or review of chart    verbalize understanding of plan for management of HTN as evidenced by patient report and review of chart  Interventions:   Hypertension Interventions: Last practice recorded BP readings:  BP Readings from Last 3 Encounters:  02/13/24 (!) 148/96  02/10/24 (!) 199/123  02/03/24 (!) 180/100   Most recent eGFR/CrCl:  Lab Results  Component Value Date   EGFR 49 (L) 01/05/2024    No components found for: "CRCL"  Evaluation of current treatment plan related to hypertension self management and patient's adherence to plan as established by provider Reviewed medications with patient and discussed importance of compliance Discussed plans with patient for ongoing care management follow up and provided patient with direct contact information for care management team Reviewed scheduled/upcoming provider appointments including:  Screening for signs and symptoms of depression related to chronic disease state  Advised patient to provide update of BP readings Discussed food high in  salt Reviewed how to check blood pressure Provided Emmi education regarding relieving stress; how to measure blood pressure and lifestyle changes to lower blood pressure.  Patient Self-Care Activities:  take blood pressure log to all doctor appointments keep all doctor appointments report new symptoms to your doctor Continue to follow a low salt diet  Plan:  The care management team will reach out to the patient again over the next 30 days.             Please call the Suicide and Crisis Lifeline: 988 call the [place] National Suicide Prevention Lifeline: (414)648-0852 or TTY: 207-337-6471 TTY (978)278-7926) to talk to a trained counselor if you are experiencing a Mental Health or Behavioral Health Crisis or need someone to talk to.  The patient verbalized understanding of instructions, educational materials, and care plan provided today and DECLINED offer to receive copy of patient instructions, educational materials, and care plan.   Kathyrn Sheriff, RN, MSN, BSN, CCM [place]  [facility], Population Health Case Manager Phone: [phone]

## 2024-02-17 MED ORDER — AMLODIPINE BESYLATE 10 MG PO TABS
10.0000 mg | ORAL_TABLET | Freq: Every day | ORAL | 0 refills | Status: DC
Start: 2024-02-17 — End: 2024-03-26

## 2024-02-17 NOTE — Addendum Note (Signed)
 Addended by: Bambi Lever A on: 02/17/2024 02:01 PM   Modules accepted: Orders

## 2024-02-21 ENCOUNTER — Encounter: Payer: Self-pay | Admitting: Internal Medicine

## 2024-02-23 NOTE — Telephone Encounter (Signed)
 Also - if an appt is made, please make it with a provider. I have spoken with her before and not sure there is much I can offer. Thank you!

## 2024-02-23 NOTE — Telephone Encounter (Signed)
 Per Dr. Levern Reader stop ART and start benadryl. Will call patient directly.  Spoke with pt who verbalized understanding. States she is still having swollen tongue. Is having difficulty swallowing large things. Denies SOB, rash.  Understands if symptoms worsen to go ED.  Julien Odor, RMA

## 2024-02-23 NOTE — Telephone Encounter (Signed)
 I know this pt from when she was seeing Dr. Daina Drum. She has always had side effects to every medication we have tried her on for years. Leaving this up to Dr. Levern Reader as I am not a fan of continually changing ART.

## 2024-02-23 NOTE — Telephone Encounter (Signed)
 Sorry meant to add you to the message above!

## 2024-02-23 NOTE — Telephone Encounter (Signed)
 Decision will be up to Dr. Levern Reader - thank you!

## 2024-02-25 ENCOUNTER — Ambulatory Visit (HOSPITAL_COMMUNITY)
Admission: RE | Admit: 2024-02-25 | Discharge: 2024-02-25 | Disposition: A | Discharge status: Home / Self Care | Source: Ambulatory Visit | Attending: Internal Medicine | Admitting: Internal Medicine

## 2024-02-25 ENCOUNTER — Ambulatory Visit (HOSPITAL_BASED_OUTPATIENT_CLINIC_OR_DEPARTMENT_OTHER)
Admission: RE | Admit: 2024-02-25 | Discharge: 2024-02-25 | Disposition: A | Discharge status: Home / Self Care | Source: Ambulatory Visit | Attending: Cardiovascular Disease | Admitting: Cardiovascular Disease

## 2024-02-25 DIAGNOSIS — I10 Essential (primary) hypertension: Secondary | ICD-10-CM

## 2024-02-25 DIAGNOSIS — M79605 Pain in left leg: Secondary | ICD-10-CM

## 2024-02-25 DIAGNOSIS — M79604 Pain in right leg: Secondary | ICD-10-CM | POA: Diagnosis not present

## 2024-02-25 LAB — VAS US ABI WITH/WO TBI
Left ABI: 1.05
Right ABI: 0.99

## 2024-02-27 ENCOUNTER — Other Ambulatory Visit: Payer: Self-pay

## 2024-02-27 NOTE — Patient Outreach (Signed)
 Complex Care Management   Visit Note  02/27/2024  Name:  Stephanie Espinoza MRN: [id number] DOB: 1956/04/10  Situation: Referral received for Complex Care Management related to  HTN  I obtained verbal consent from Patient.  Visit completed with Patient  on the phone  Background:   Past Medical History:  Diagnosis Date   Abnormal Pap smear of cervix before 1995   always a repeat back to normal except for 1 time and had colpo bioosy -resutls were normal   Allergic rhinitis    Allergy    Anxiety    Bell's palsy after 1995 ?   no residual SE.   Depression    Genital herpes    History of drug abuse (HCC)    drug usser 1984 - 1990.  cocaine   HIV (human immunodeficiency virus infection) (HCC) 1990   from sexual contact who is unknown   Hypertension    Syphilis    treated    Assessment: Per Patient, Valsartan  and Juluca  stopped per providers instructions on 02/20/24 due to episode of tongue swelling and difficulty swallowing. She reports treated with Benadryl also. She denies any difficulty at this time. Per patient, Amlodipine  added back by primary care on 02/24/24. She continues to monitor BP.  Patient Reported Symptoms: Cognitive Cognitive Status: Alert and oriented to person, place, and time, Insightful and able to interpret abstract concepts Cognitive/Intellectual Conditions Management [RPT]: None reported or documented in medical history or problem list      Neurological Neurological Review of Symptoms: No symptoms reported    HEENT HEENT Symptoms Reported: No symptoms reported      Cardiovascular Cardiovascular Symptoms Reported: No symptoms reported Does patient have uncontrolled Hypertension?: Yes Is patient checking Blood Pressure at home?: Yes Patient's Recent BP reading at home: BP 162/106 last checked today Cardiovascular Conditions: Hypertension Cardiovascular Management Strategies: Medication therapy, Diet modification, Routine screening, Coping strategies, Adequate rest  (stress management) Cardiovascular Self-Management Outcome: 3 (uncertain)  Respiratory Respiratory Symptoms Reported: No symptoms reported    Endocrine Patient reports the following symptoms related to hypoglycemia or hyperglycemia : No symptoms reported Is patient diabetic?: No    Gastrointestinal Gastrointestinal Symptoms Reported: No symptoms reported      Genitourinary Genitourinary Symptoms Reported: No symptoms reported    Integumentary Integumentary Symptoms Reported: No symptoms reported    Musculoskeletal Musculoskelatal Symptoms Reviewed: Difficulty walking, Other Other Musculoskeletal Symptoms: neuropathy. patient is active with the Aging Gracefully program Additional Musculoskeletal Details: per patient no changes-neuropathy affects walking        Psychosocial Psychosocial Symptoms Reported: No symptoms reported            02/13/2024    1:39 PM  Depression screen PHQ 2/9  Decreased Interest 0  Down, Depressed, Hopeless 0  PHQ - 2 Score 0    Vitals:   02/27/24 0920  BP: (!) 162/106  Pulse: 71    Medications Reviewed Today     Reviewed by [name], RN (Registered Nurse) on 02/27/24 at 1011  Med List Status: <None>   Medication Order Taking? Sig Documenting Provider Last Dose Status Informant  acyclovir  ointment (ZOVIRAX ) 5 % [id number] Yes APPLY TOPICALLY EVERY 3 (THREE) HOURS. USE AS NEEDED FOR SYMPTOMS RELIEF FOR NO MORE THAN 7 DAYS. [name], CNM Taking Active   albuterol  (VENTOLIN  HFA) 108 (90 Base) MCG/ACT inhaler [id number] Yes INHALE 1-2 PUFFS INTO THE LUNGS EVERY 4 HOURS AS NEEDED FOR WHEEZING OR SHORTNESS OF BREATH. [name], MD Taking  Active   amitriptyline  (ELAVIL ) 10 MG tablet [id number] Yes Take 10 mg by mouth at bedtime. [provider] Taking Active            Med Note [name], [name]   Fri Feb 27, 2024 10:07 AM) as needed  amLODipine  (NORVASC ) 10 MG tablet [id number] Yes Take 1 tablet (10 mg total) by mouth  daily. [name], MD Taking Active   bictegravir-emtricitabine -tenofovir  AF (BIKTARVY ) 50-200-25 MG TABS tablet [id number] No Take 1 tablet by mouth daily.  Patient not taking: Reported on 02/10/2024   [name], MD Not Taking Active   cholecalciferol (VITAMIN D3) 25 MCG (1000 UNIT) tablet [id number] Yes Take 1,000 Units by mouth daily. gummie [provider] Taking Active   cyclobenzaprine  (FLEXERIL ) 5 MG tablet [id number] Yes TAKE 1 TABLET BY MOUTH THREE TIMES A DAY AS NEEDED FOR MUSCLE SPASM [name], MD Taking Active   dolutegravir -rilpivirine (JULUCA ) 50-25 MG tablet [id number] No Take 1 tablet by mouth daily before lunch.  Patient not taking: Reported on 02/27/2024   [name], MD Not Taking Active   EPINEPHrine  (EPIPEN  2-PAK) 0.3 mg/0.3 mL IJ SOAJ injection [id number] Yes Inject 0.3 mg into the muscle as needed for anaphylaxis. [name], MD Taking Active   fluticasone  (FLONASE ) 50 MCG/ACT nasal spray [id number] Yes Place 2 sprays into both nostrils daily. [name], MD Taking Active            Med Note [name], [name]   Fri Feb 27, 2024 10:09 AM) As needed  gabapentin  (NEURONTIN ) 100 MG capsule [id number] Yes Take 100 mg by mouth at bedtime. [provider] Taking Active            Med Note [name], [name]   Tue Jul 08, 2023  8:21 AM) As needed  lidocaine  (LIDODERM ) 5 % [id number] Yes Place 1 patch onto the skin daily. Remove & Discard patch within 12 hours or as directed by MD [name], MD Taking Active            Med Note [name], [name]   Fri Feb 27, 2024 10:10 AM) As needed  Multiple Vitamin (MULTIVITAMIN) tablet [id number] Yes Take 2 tablets by mouth daily. gummies [provider] Taking Active   sulfamethoxazole -trimethoprim  (BACTRIM  DS) 800-160 MG tablet [id number] Yes TAKE 1 TABLET BY MOUTH EVERY DAY [name], MD Taking Active   traMADol (ULTRAM) 50 MG tablet [id number] Yes Take 50 mg by  mouth every 4 (four) hours as needed. [provider] Taking Active   valACYclovir  (VALTREX ) 500 MG tablet [id number] Yes Take 500 mg by mouth 2 (two) times daily. TAKES AS PRN [provider] Taking Active   valsartan  (DIOVAN ) 40 MG tablet [id number] No Take 1 tablet (40 mg total) by mouth daily.  Patient not taking: Reported on 02/27/2024   [name], MD Not Taking Active           Recommendation:   Patient to follow up with Primary Care Provider as recommended Patient to continue to check and record blood pressure readings. Patient to let primary care provider know updated readings. Patient reports BP last checked today was 162/106. Patient to contact provider and/or seek emergency attention for BP 180/120 or higher or experiencing symptoms that may include: Chest pain; SOB; Back pain; Numbness; Weakness; Change in vision; Difficulty speaking  Follow Up Plan:   Telephone follow up appointment date/time:  03/30/24 at 1:00 pm  Lindi Revering, RN,  MSN, BSN, CCM [place]  [facility], Population Health Case Manager Phone: [phone]

## 2024-02-27 NOTE — Patient Instructions (Signed)
 Visit Information  Thank you for taking time to visit with me today. Please don't hesitate to contact me if I can be of assistance to you before our next scheduled appointment.  Our next appointment is by telephone on 03/30/24 at 1:00 pm Please call the care guide team at [phone] if you need to cancel or reschedule your appointment.   Following is a copy of your care plan:   Goals Addressed             This Visit's Progress    VBCI RN Care Plan       Problems:  Chronic Disease Management support and education needs related to HTN No Advanced Directives in place  Goal: Over the next 90 days the Patient will collaborate with the care management team towards completion of advanced directives and report receipt of packet as evidenced by patient report  take all medications exactly as prescribed and will call provider for medication related questions as evidenced by patient report or review of chart    verbalize understanding of plan for management of HTN as evidenced by patient report and review of chart  Interventions:   Hypertension Interventions: Last practice recorded BP readings:  BP Readings from Last 3 Encounters:  02/27/24 (!) 162/106  02/13/24 (!) 148/96  02/10/24 (!) 199/123   Most recent eGFR/CrCl:  Lab Results  Component Value Date   EGFR 49 (L) 01/05/2024    No components found for: "CRCL"  Evaluation of current treatment plan related to hypertension self management and patient's adherence to plan as established by provider Reviewed medications with patient and discussed importance of compliance Reviewed scheduled/upcoming provider appointments including:  Screening for signs and symptoms of depression related to chronic disease state  Advised patient to provide update of BP readings Patient reports restarted amlodipine  per primary care provider instructions on 02/24/24. RNCM advised patient to continue to check BP and send to Primary care provider of readings  next week or per primary care recommendation  Discussed hypertensive crisis and when to seek medical attention(per american heart association): Do not wait to see whether your blood pressure comes down on its own. Call 911. Call 911 if your blood pressure is 180/120 or higher and you are experiencing symptoms that may include: Chest pain Shortness of breath Back pain Numbness Weakness Change in vision Difficulty speaking Discussed food high in salt Reviewed best way to obtain an accurate BP reading. Confirmed patient received educational material sent previously  Patient Self-Care Activities:  take blood pressure log to all doctor appointments call doctor for signs and symptoms of high blood pressure keep all doctor appointments take medications for blood pressure exactly as prescribed report new symptoms to your doctor Continue to follow a low salt diet Call 911 if your blood pressure is 180/120 or higher and you are experiencing symptoms that may include: chest pain; shortness of breath, back pain, numbness, weakness, change in vision or difficulty speaking.  Plan:  The care management team will reach out to the patient again over the next 30 days.           Please call the Suicide and Crisis Lifeline: 988 if you are experiencing a Mental Health or Behavioral Health Crisis or need someone to talk to.  Patient verbalizes understanding of instructions and care plan provided today and agrees to view in MyChart. Active MyChart status and patient understanding of how to access instructions and care plan via MyChart confirmed with patient.     Josiah Wojtaszek  [name], RN, MSN, BSN, CCM [employer], Population Health Case Manager Phone: [phone]

## 2024-03-04 ENCOUNTER — Other Ambulatory Visit: Payer: Self-pay | Admitting: *Deleted

## 2024-03-04 ENCOUNTER — Encounter: Payer: Self-pay | Admitting: *Deleted

## 2024-03-04 NOTE — Patient Instructions (Signed)
 Visit Information  Thank you for taking time to visit with me today. Please don't hesitate to contact me if I can be of assistance to you before our next scheduled home appointment.  Following are the goals we discussed today:   Goals Addressed               This Visit's Progress     AG RN (pt-stated)        01/06/24  Assessment: Stephanie Espinoza reports she will like to get outside more. Reports she will like to walk around the neighborhood when the weather gets warmer. Reports having a cell phone pouch to place around her neck and a smart watch for emergencies when walking outside. 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 Stephanie Espinoza has rollator, walker, and cane for ambulation. Uses her cane most days.  Interventions: Encouraged Stephanie Espinoza to ask neighbors and/or daughter to walk outside with her. Encouraged Stephanie Espinoza not to walk outside alone. Encouraged Stephanie Espinoza to call [employer] (closest to her house) to see if they offer senior exercise programs similar to the other centers. Provided calendar book, writer's contact information, and [place] [facility] pamphlet.  Plan: Scheduled next home visit for 02/10/24 at 12 noon.   Nolberto Batty, MSN, RN, BSN   [facility], Healthy Communities RN Case Manager for Aging Gracefully Direct Dial: [phone]     CLIENT/RN ACTION PLAN - GENERIC - (smartphrase AGRNGENERIC)  Registered Nurse:  Nolberto Batty, RN Date: 01/06/2024  Client Name: Stephanie Espinoza Client ID:    Target Area:  [place]   Why Problem May Occur:  When not feeling well enough or independent enough to go outside    Target Goal:  To go outside and walk (short distances) with someone else, for 1-2 times a week over the next 160 days.    STRATEGIES Coping Strategies: Ideas  Exercise/Activity Reviewed 01/06/24 Reviewed 02/10/24 (exercise booklet) Reviewed 03/04/24 Aging Gracefully exercises, [employer] program, senior group exercises  at the recreation center, walking outside (short distances)  Involving others with exercises Reviewed 01/06/24 Reviewed 02/10/24 Reviewed 03/04/24 Asking family/neighbors to participate with walks     Safety Reviewed 01/06/24 Reviewed 02/10/24 Reviewed 03/04/24 Use cane when walking outside, wear phone in pouch around neck, do not walk outside alone. Walk in short increments.        Prevention Ideas                  PRACTICE It is important to practice the strategies so we can determine if they will be effective in helping to reach the goal.    Follow these specific recommendations:        If strategy does not work the first time, try it again.     We may make some changes over the next few sessions.      Nolberto Batty, MSN, RN, BSN [facility], Healthy Communities RN Case Manager for Aging Gracefully Direct Dial: [phone]    02/10/24  Assessment:  [name] reports walking outside once a week now. States she uses her cane and has smart watch on her wrist that she can use to call for emergencies. Denies any recent falls. Reports pain is 7 out of 10. Does not like taking pain medication. States she usually takes pain medication at night before bed. Reports having history of side effects and reactions from various medications. States she looks up drug interactions and  side effects on line to stay informed. Reports having high blood pressure as of late. Stephanie Espinoza used her own blood pressure machine to take her own blood pressure while writer was in the home. Her BP was 214/116;  She took her Amlodipine  before writer left the home. About 1.5 later, self reported BP was 199/123. Stephanie Espinoza denied SOB, HA, CP, or dizziness.   Interventions: Writer performed home exercises as Stephanie Espinoza watched. Advised Stephanie Espinoza not to demonstrate exercises at the time due to elevated blood pressure. Provided home exercise booklet. Discussed VBCI complex care management referral for  blood pressure management and referral to Pharmacy for medication review interactions. Stephanie Espinoza agreeable to the referrals. Discussed Clinical research associate sending EMMI videos and literature about HTN and circulatory issues per her request.  Advised Stephanie Espinoza to go to the ED for ongoing high blood pressure readings. Stephanie Espinoza declined going to ED. She stated she will consider going but wants to monitor herself for now. Writer sent secure chat to Dr. Nicolette Barrio to make aware of Stephanie Espinoza's blood pressure readings of 214/116 and 199/123 and that she took her Amlodipine .   Plan: Will make referral to [facility] CCM and will request CCM team to make referral for Pharmacy for medication interactions and review. Scheduled next home visit for May 1st at 12 noon.   Nolberto Batty, MSN, RN, BSN [facility], Healthy Communities RN Case Manager for Aging Gracefully Direct Dial: [phone]     03/04/24  Assessment: Stephanie Espinoza reports latest blood pressure is 144/106. Denies SOB, chest pain, dizziness, HA, or numbness. Reports taking blood pressure when she is settled. Reports monitoring and being mindful of her salt intake in foods. Reports taking medications as prescribed. States she usually makes pretty healthy food choices. States her daughter makes sure of it. Reports getting outside doing short walks in the neighborhood. When fatigued, reports pacing herself and taking frequent rest breaks during the day. Stephanie Espinoza reports she wears her smart watch when walking to call for emergencies and etc.   Interventions: Discussed when to call 911 for high blood pressure and symptoms. Encouraged Stephanie Espinoza to continue monitoring and documenting blood pressure readings. Encouraged Stephanie Espinoza to use walking device as needed. Discussed fall precautions.   Plan: Scheduled next home visit on June 10th at 12 noon.   Nolberto Batty, MSN, RN, BSN [place]  [facility], Healthy Communities RN Case Manager  for Aging Gracefully Direct Dial: [phone]                                                                                            Our next appointment is on June 10 at 12 noon  If you are experiencing a Mental Health or Behavioral Health Crisis or need someone to talk to, please call the Suicide and Crisis Lifeline: 988 call the [place]  National Suicide Prevention Lifeline: 534-077-9784 or TTY: 815-469-4940 TTY 223 876 3843) to talk to a trained counselor call 1-800-273-TALK (toll free, 24 hour hotline) go to [facility] [address], [place] [id number]) call 911   The patient  verbalized understanding of instructions, educational materials, and care plan provided today and agreed to receive a mailed copy of patient instructions, educational materials, and care plan.    Nolberto Batty, MSN, RN, BSN [place]  [facility], Healthy Communities RN Case Manager for Aging Gracefully Direct Dial: [phone]

## 2024-03-04 NOTE — Patient Outreach (Signed)
 Aging Gracefully Program  RN Visit  03/04/2024  Stephanie Espinoza 1955/11/22 [id number]  Visit:  RN Visit Number: 4- Fourth Visit  RN TIME CALCULATION: Start TIme:  RN Start Time Calculation: 1200 End Time:  RN Stop Time Calculation: 1330 Total Minutes:  RN Time Calculation: 90  Readiness To Change Score:  Readiness to Change Score: 10  Universal RN Interventions: Calendar Distribution: No Exercise Review: Yes Medications: Yes Medication Changes: Yes Mood: Yes Pain: Yes PCP Advocacy/Support: No Fall Prevention: Yes Incontinence: Yes Clinician View Of Client Situation: Arrived to client's home. Ms. Rosenstiel answered door. Ambulates without assistive device. Steady gait. Home neat without clutter. Cats and dog are present. Client View Of His/Her Situation: Ms. Arts doing well overall. Reports latest blood pressure is 144/106. Denies SOB, chest pain, dizziness, HA, or numbness. Denies any falls or pain.  Healthcare Provider Communication: Did Surveyor, mining With [employer] Provider?: No Healthcare Provider Response According to RN: N/A According to Client, Did PCP Report Communication With An Aging Gracefully RN?: No Healthcare Provider Response According To Client: N/A  Clinician View of Client Situation: Clinician View Of Client Situation: Arrived to client's home. Ms. Campton answered door. Ambulates without assistive device. Steady gait. Home neat without clutter. Cats and dog are present. Client's View of His/Her Situation: Client View Of His/Her Situation: Ms. Uhle doing well overall. Reports latest blood pressure is 144/106. Denies SOB, chest pain, dizziness, HA, or numbness. Denies any falls or pain.  Medication Assessment: Reviewed    OT Update: Pending [facility] assessment and contracts.  Session Summary: Ms. Shrock is doing well overall. In good spirits and very engaging. Denies any concerns or complaints.    Goals Addressed               This Visit's Progress      AG RN (pt-stated)        01/06/24  Assessment: Ms. Kracht reports she will like to get outside more. Reports she will like to walk around the neighborhood when the weather gets warmer. Reports having a cell phone pouch to place around her neck and a smart watch for emergencies when walking outside. 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 Ms. Burianek has rollator, walker, and cane for ambulation. Uses her cane most days.  Interventions: Encouraged Ms. Loberg to ask neighbors and/or daughter to walk outside with her. Encouraged Ms. Kronenwetter not to walk outside alone. Encouraged Ms. Baye to call [employer] (closest to her house) to see if they offer senior exercise programs similar to the other centers. Provided calendar book, writer's contact information, and [place] [facility] Now [facility] pamphlet.  Plan: Scheduled next home visit for 02/10/24 at 12 noon.   Nolberto Batty, MSN, RN, BSN [place]  [facility], Healthy Communities RN Case Manager for Aging Gracefully Direct Dial: [phone]     CLIENT/RN ACTION PLAN - GENERIC - (smartphrase AGRNGENERIC)  Registered Nurse:  Nolberto Batty, RN Date: 01/06/2024  Client Name: Stephanie Espinoza Client ID:    Target Area:  [place]   Why Problem May Occur:  When not feeling well enough or independent enough to go outside    Target Goal:  To go outside and walk (short distances) with someone else, for 1-2 times a week over the next 160 days.    STRATEGIES Coping Strategies: Ideas  Exercise/Activity Reviewed 01/06/24 Reviewed 02/10/24 (exercise booklet) Reviewed 03/04/24 Aging Gracefully exercises, [employer] conservator/restorer program, senior group exercises at the recreation center, walking outside (  short distances)  Involving others with exercises Reviewed 01/06/24 Reviewed 02/10/24 Reviewed 03/04/24 Asking family/neighbors to participate with walks     Safety Reviewed 01/06/24 Reviewed 02/10/24 Reviewed 03/04/24 Use cane when walking outside, wear  phone in pouch around neck, do not walk outside alone. Walk in short increments.        Prevention Ideas                  PRACTICE It is important to practice the strategies so we can determine if they will be effective in helping to reach the goal.    Follow these specific recommendations:        If strategy does not work the first time, try it again.     We may make some changes over the next few sessions.      Nolberto Batty, MSN, RN, BSN [facility], Healthy Communities RN Case Manager for Aging Gracefully Direct Dial: [phone]    02/10/24  Assessment:  Ms .Gratz reports walking outside once a week now. States she uses her cane and has smart watch on her wrist that she can use to call for emergencies. Denies any recent falls. Reports pain is 7 out of 10. Does not like taking pain medication. States she usually takes pain medication at night before bed. Reports having history of side effects and reactions from various medications. States she looks up drug interactions and side effects on line to stay informed. Reports having high blood pressure as of late. Ms. Sloma used her own blood pressure machine to take her own blood pressure while writer was in the home. Her BP was 214/116;  She took her Amlodipine  before writer left the home. About 1.5 later, self reported BP was 199/123. Ms. Dobner denied SOB, HA, CP, or dizziness.   Interventions: Writer performed home exercises as [name] watched. Advised Ms. Starling not to demonstrate exercises at the time due to elevated blood pressure. Provided home exercise booklet. Discussed [facility] complex care management referral for blood pressure management and referral to Pharmacy for medication review interactions. Ms. Coxen agreeable to the referrals. Discussed Clinical research associate sending EMMI videos and literature about HTN and circulatory issues per her request.  Advised Ms. Garnet to go to the ED for ongoing high blood pressure  readings. Ms. Stoltzfoos declined going to ED. She stated she will consider going but wants to monitor herself for now. Writer sent secure chat to Dr. Nicolette Barrio to make aware of Ms. Cordle's blood pressure readings of 214/116 and 199/123 and that she took her Amlodipine .   Plan: Will make referral to [facility] CCM and will request CCM team to make referral for Pharmacy for medication interactions and review. Scheduled next home visit for May 1st at 12 noon.   Nolberto Batty, MSN, RN, BSN [facility], Healthy Communities RN Case Manager for Aging Gracefully Direct Dial: [phone]     03/04/24  Assessment: Ms. Strittmatter reports latest blood pressure is 144/106. Denies SOB, chest pain, dizziness, HA, or numbness. Reports taking blood pressure when she is settled. Reports monitoring and being mindful of her salt intake in foods. Reports taking medications as prescribed. States she usually makes pretty healthy food choices. States her daughter makes sure of it. Reports getting outside doing short walks in the neighborhood. When fatigued, reports pacing herself and taking frequent rest breaks during the day. Ms. Audas reports she wears her smart watch when walking to call for emergencies and etc.  Interventions: Discussed when to call 911 for high blood pressure and symptoms. Encouraged Ms. Barthelemy to continue monitoring and documenting blood pressure readings. Encouraged Ms. Zora to use walking device as needed. Discussed fall precautions.   Plan: Scheduled next home visit on June 10th at 12 noon.   Nolberto Batty, MSN, RN, BSN [place]  [facility], Healthy Communities RN Case Manager for Aging Gracefully Direct Dial: [phone]                                                                                         Nolberto Batty, MSN, RN, BSN [place]  [facility], Healthy  Communities RN Case Manager for Aging Gracefully Direct Dial: [phone]

## 2024-03-05 ENCOUNTER — Other Ambulatory Visit (INDEPENDENT_AMBULATORY_CARE_PROVIDER_SITE_OTHER): Admitting: Pharmacist

## 2024-03-05 DIAGNOSIS — I10 Essential (primary) hypertension: Secondary | ICD-10-CM

## 2024-03-05 NOTE — Patient Instructions (Signed)
 Recommend adding hydrochlorothiazide  25 mg 1 tablet daily to further reduce blood pressure.  The goal is to get your blood pressure to be less than 130/80 on average to reduce the risk of the long term negative effects of uncontrolled high blood pressure including increased risk of stroke, congestive heart failure, vision changes, and kidney function decline/failure.  Schedule an appointment with Dr. Nicolette Barrio for follow up if blood pressures are remaining consistently above 140/90.  Feel free to call with any questions or concerns!  Rainelle Bur, PharmD, BCPS, CPP Clinical Pharmacist Practitioner [facility] at [facility] [phone]

## 2024-03-05 NOTE — Progress Notes (Signed)
 03/05/2024 Name: Stephanie Espinoza MRN: [id number] DOB: 03/25/1956  Chief Complaint  Patient presents with   Hypertension   Medication Management      Stephanie Espinoza is a 68 y.o. year old female who presented for a telephone visit.   They were referred to the pharmacist by their Case Management Team  for assistance in managing hypertension.    Subjective:  Care Team: Primary Care Provider: [name], MD ; Next Scheduled Visit: none scheduled   Medication Access  Current Pharmacy:  [id number] - [place], [place] - [address] [place] [address] Phone: [phone] Fax: [phone]   Hypertension:  Current medications: amlodipine  10 mg daily Medications previously tried: valsartan  40 mg - stopped due to tongue swelling. No swelling occurred on valsartan  20 mg. Hydrochlorothiazide  was prescribed in the past 2017-2018 per chart however pt notes she did not take BP medication prior to valsartan  and does not recall that medication  Patient has a validated, automated, upper arm home BP cuff Current blood pressure readings readings:  167/102 144/106 149/99   Patient denies hypertensive symptoms including headache, chest pain, shortness of breath    Objective:  Lab Results  Component Value Date   HGBA1C 5.9 02/04/2022    Lab Results  Component Value Date   CREATININE 1.22 (H) 01/05/2024   BUN 16 01/05/2024   NA 141 01/05/2024   K 3.7 01/05/2024   CL 105 01/05/2024   CO2 26 01/05/2024    Lab Results  Component Value Date   CHOL 175 08/04/2023   HDL 49 (L) 08/04/2023   LDLCALC 102 (H) 08/04/2023   LDLDIRECT 102.1 03/02/2009   TRIG 147 08/04/2023   CHOLHDL 3.6 08/04/2023    Medications Reviewed Today     Reviewed by Dion Frankel, RPH (Pharmacist) on 03/05/24 at 1648  Med List Status: <None>   Medication Order Taking? Sig Documenting Provider Last Dose Status Informant  acyclovir  ointment (ZOVIRAX ) 5 %  [id number]  APPLY TOPICALLY EVERY 3 (THREE) HOURS. USE AS NEEDED FOR SYMPTOMS RELIEF FOR NO MORE THAN 7 DAYS. [name], CNM  Active   albuterol  (VENTOLIN  HFA) 108 (90 Base) MCG/ACT inhaler [id number]  INHALE 1-2 PUFFS INTO THE LUNGS EVERY 4 HOURS AS NEEDED FOR WHEEZING OR SHORTNESS OF BREATH. [name], MD  Active   amitriptyline  (ELAVIL ) 10 MG tablet [id number]  Take 10 mg by mouth at bedtime. [provider]  Active            Med Note [name], JUANA M   Fri Feb 27, 2024 10:07 AM) as needed  amLODipine  (NORVASC ) 10 MG tablet [id number] Yes Take 1 tablet (10 mg total) by mouth daily. [name], MD Taking Active   bictegravir-emtricitabine -tenofovir  AF (BIKTARVY ) 50-200-25 MG TABS tablet [id number]  Take 1 tablet by mouth daily.  Patient not taking: Reported on 02/10/2024   [name], MD  Active   cholecalciferol (VITAMIN D3) 25 MCG (1000 UNIT) tablet [id number]  Take 1,000 Units by mouth daily. gummie [provider]  Active   cyclobenzaprine  (FLEXERIL ) 5 MG tablet [id number]  TAKE 1 TABLET BY MOUTH THREE TIMES A DAY AS NEEDED FOR MUSCLE SPASM [name], MD  Active   dolutegravir -rilpivirine (JULUCA ) 50-25 MG tablet [id number]  Take 1 tablet by mouth daily before lunch.  Patient not taking: Reported on 02/27/2024   [name], MD  Active   EPINEPHrine  (EPIPEN  2-PAK) 0.3 mg/0.3 mL IJ SOAJ injection [id number]  Inject 0.3 mg into the muscle as needed for anaphylaxis. [name], MD  Active   fluticasone  (FLONASE ) 50 MCG/ACT nasal spray [id number]  Place 2 sprays into both nostrils daily. [name], MD  Active            Med Note [name], [name]   Fri Feb 27, 2024 10:09 AM) As needed  gabapentin  (NEURONTIN ) 100 MG capsule [id number]  Take 100 mg by mouth at bedtime. [provider]  Active            Med Note [name], [facility] S   Tue Jul 08, 2023  8:21 AM) As needed  lidocaine  (LIDODERM ) 5 % [id number]  Place 1 patch  onto the skin daily. Remove & Discard patch within 12 hours or as directed by MD [name], MD  Active            Med Note [name], JUANA M   Fri Feb 27, 2024 10:10 AM) As needed  Multiple Vitamin (MULTIVITAMIN) tablet [id number]  Take 2 tablets by mouth daily. gummies [provider]  Active   sulfamethoxazole -trimethoprim  (BACTRIM  DS) 800-160 MG tablet [id number]  TAKE 1 TABLET BY MOUTH EVERY DAY [name], MD  Active   traMADol (ULTRAM) 50 MG tablet [id number]  Take 50 mg by mouth every 4 (four) hours as needed. [provider]  Active   valACYclovir  (VALTREX ) 500 MG tablet [id number]  Take 500 mg by mouth 2 (two) times daily. TAKES AS PRN [provider]  Active               Assessment/Plan:   Hypertension: - Currently uncontrolled. BP goal <130/80 - Reviewed long term cardiovascular and renal outcomes of uncontrolled blood pressure - Recommended to check home blood pressure and heart rate  - Recommend to continue amlodipine . Recommended adding hydrochlorothiazide  25 mg daily to her current regimen due to uncontrolled BP however she is not agreeable to adding a second BP medication despite verbalizing understanding of the risks discussed of uncontrolled HTN. - hydrochlorothiazide  mechanism of action and side effect reviewed - She will look into the medication and determine if she is comfortable adding hydrochlorothiazide . Also mentioned she should schedule an appt with PCP if her BP is remaining elevated.   Follow Up Plan: PRN  Rainelle Bur, PharmD, BCPS, CPP Clinical Pharmacist Practitioner [facility] at [facility] [phone]

## 2024-03-08 ENCOUNTER — Ambulatory Visit: Admitting: Internal Medicine

## 2024-03-11 ENCOUNTER — Telehealth (INDEPENDENT_AMBULATORY_CARE_PROVIDER_SITE_OTHER): Admitting: Internal Medicine

## 2024-03-11 ENCOUNTER — Encounter: Payer: Self-pay | Admitting: Internal Medicine

## 2024-03-11 ENCOUNTER — Other Ambulatory Visit: Payer: Self-pay

## 2024-03-11 DIAGNOSIS — B20 Human immunodeficiency virus [HIV] disease: Secondary | ICD-10-CM | POA: Diagnosis not present

## 2024-03-11 NOTE — Progress Notes (Signed)
 I connected with  Stephanie Espinoza on 04/04/24 by a video enabled telemedicine application and verified that I am speaking with the correct person using two identifiers.   I discussed the limitations of evaluation and management by telemedicine. The patient expressed understanding and agreed to proceed.   Patient is at home/provider in clinic  Patient ID: Stephanie Espinoza, female   DOB: 06-01-1956, 68 y.o.   MRN: [id number]  HPI Stephanie Espinoza is a88yo F with HIv idsease, who recently was on juluca  but started to notice pruritus and possible lip swelling but also was on BP med, losartan. She has stopped taking both medication. She is following up to see how to change her regimen.  Outpatient Encounter Medications as of 03/11/2024  Medication Sig   acyclovir  ointment (ZOVIRAX ) 5 % APPLY TOPICALLY EVERY 3 (THREE) HOURS. USE AS NEEDED FOR SYMPTOMS RELIEF FOR NO MORE THAN 7 DAYS.   albuterol  (VENTOLIN  HFA) 108 (90 Base) MCG/ACT inhaler INHALE 1-2 PUFFS INTO THE LUNGS EVERY 4 HOURS AS NEEDED FOR WHEEZING OR SHORTNESS OF BREATH.   amitriptyline  (ELAVIL ) 10 MG tablet Take 10 mg by mouth at bedtime.   amLODipine  (NORVASC ) 10 MG tablet Take 1 tablet (10 mg total) by mouth daily.   cholecalciferol (VITAMIN D3) 25 MCG (1000 UNIT) tablet Take 1,000 Units by mouth daily. gummie   cyclobenzaprine  (FLEXERIL ) 5 MG tablet TAKE 1 TABLET BY MOUTH THREE TIMES A DAY AS NEEDED FOR MUSCLE SPASM   EPINEPHrine  (EPIPEN  2-PAK) 0.3 mg/0.3 mL IJ SOAJ injection Inject 0.3 mg into the muscle as needed for anaphylaxis.   fluticasone  (FLONASE ) 50 MCG/ACT nasal spray Place 2 sprays into both nostrils daily.   gabapentin  (NEURONTIN ) 100 MG capsule Take 100 mg by mouth at bedtime.   lidocaine  (LIDODERM ) 5 % Place 1 patch onto the skin daily. Remove & Discard patch within 12 hours or as directed by MD   sulfamethoxazole -trimethoprim  (BACTRIM  DS) 800-160 MG tablet TAKE 1 TABLET BY MOUTH EVERY DAY   traMADol (ULTRAM) 50 MG tablet Take 50 mg by  mouth every 4 (four) hours as needed.   valACYclovir  (VALTREX ) 500 MG tablet Take 500 mg by mouth 2 (two) times daily. TAKES AS PRN   bictegravir-emtricitabine -tenofovir  AF (BIKTARVY ) 50-200-25 MG TABS tablet Take 1 tablet by mouth daily. (Patient not taking: Reported on 03/11/2024)   dolutegravir -rilpivirine (JULUCA ) 50-25 MG tablet Take 1 tablet by mouth daily before lunch. (Patient not taking: Reported on 03/11/2024)   Multiple Vitamin (MULTIVITAMIN) tablet Take 2 tablets by mouth daily. gummies   No facility-administered encounter medications on file as of 03/11/2024.     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   Right foot pain 05/22/2022   Right lumbar radiculopathy 05/08/2022   DDD (degenerative disc disease), lumbar 02/12/2022   Anterolisthesis of lumbosacral spine 02/12/2022   Weakness of both legs 03/07/2021   B12 deficiency 03/07/2021   Urinary incontinence 05/16/2020   Other fatigue 05/16/2020   History of syphilis 05/14/2020   Memory loss 02/14/2020   Insomnia 02/14/2020   Herpes zoster 12/31/2019   Normocytic anemia 03/01/2019   Hyperglycemia 03/01/2019   Headache 06/30/2018   Urinary frequency 05/01/2018   Cervical 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 (HPV) DNA test positive 10/18/2016   Essential hypertension 09/16/2014   Vasomotor rhinitis 05/05/2014   Benign paroxysmal positional  vertigo 09/21/2012   Sinusitis 03/22/2009   Human immunodeficiency virus (HIV) disease (HCC) 09/06/2006   Genital herpes 09/06/2006   Depression 09/06/2006     Health Maintenance Due  Topic Date Due   COVID-19 Vaccine (5 - 2024-25 season) 07/06/2023   MAMMOGRAM  08/03/2023     Review of Systems Lip swelling now resolved. No rash. 12 point ros is negative Physical Exam  Gen = a x o by 3 in  nad Heent =PERRLA EOMI, no scleral icterus.   Lab Results  Component Value Date   CD4TCELL 12 (L) 01/05/2024   Lab Results  Component Value Date   CD4TABS 129 (L) 01/05/2024   CD4TABS 111 (L) 10/06/2023   CD4TABS 107 (L) 08/04/2023   Lab Results  Component Value Date   HIV1RNAQUANT Not Detected 01/05/2024   Lab Results  Component Value Date   HEPBSAB No 12/29/2006   Lab Results  Component Value Date   LABRPR NON-REACTIVE 01/05/2024    CBC Lab Results  Component Value Date   WBC 3.1 (L) 01/05/2024   RBC 4.21 01/05/2024   HGB 12.3 01/05/2024   HCT 38.5 01/05/2024   PLT 237 01/05/2024   MCV 91.4 01/05/2024   MCH 29.2 01/05/2024   MCHC 31.9 (L) 01/05/2024   RDW 12.7 01/05/2024   LYMPHSABS 1,389 08/04/2023   MONOABS 0.2 05/11/2022   EOSABS 93 01/05/2024    BMET Lab Results  Component Value Date   NA 141 01/05/2024   K 3.7 01/05/2024   CL 105 01/05/2024   CO2 26 01/05/2024   GLUCOSE 104 (H) 01/05/2024   BUN 16 01/05/2024   CREATININE 1.22 (H) 01/05/2024   CALCIUM  9.4 01/05/2024   GFRNONAA >60 05/11/2022   GFRAA >60 03/03/2019      Assessment and Plan HIV disease = plan to have her come in next week for HIV VL with genotype while off of juluca .  Devise new regimen== few lesser side effect  Juluca  -- possible drug allergy, lip swelling ( but also was on losartan)

## 2024-03-11 NOTE — Telephone Encounter (Signed)
 This question can wait for PCP to return to office

## 2024-03-11 NOTE — Telephone Encounter (Signed)
Please advise as MD is out of office

## 2024-03-12 ENCOUNTER — Other Ambulatory Visit: Payer: Self-pay

## 2024-03-12 ENCOUNTER — Other Ambulatory Visit

## 2024-03-12 DIAGNOSIS — B20 Human immunodeficiency virus [HIV] disease: Secondary | ICD-10-CM

## 2024-03-12 MED ORDER — SPIRONOLACTONE 25 MG PO TABS: 25.0000 mg | ORAL_TABLET | Freq: Every day | ORAL | 0 refills | Status: DC

## 2024-03-12 MED ORDER — HYDROCHLOROTHIAZIDE 25 MG PO TABS: 25.0000 mg | ORAL_TABLET | Freq: Every day | ORAL | 3 refills | Status: DC

## 2024-03-12 NOTE — Addendum Note (Signed)
 Addended by: Bambi Lever A on: 03/12/2024 11:24 AM   Modules accepted: Orders

## 2024-03-12 NOTE — Addendum Note (Signed)
 Addended by: Dion Frankel on: 03/12/2024 08:35 AM   Modules accepted: Orders

## 2024-03-13 ENCOUNTER — Other Ambulatory Visit: Payer: Self-pay | Admitting: Internal Medicine

## 2024-03-13 DIAGNOSIS — B20 Human immunodeficiency virus [HIV] disease: Secondary | ICD-10-CM

## 2024-03-17 ENCOUNTER — Encounter: Payer: Self-pay | Admitting: Internal Medicine

## 2024-03-17 DIAGNOSIS — B20 Human immunodeficiency virus [HIV] disease: Secondary | ICD-10-CM

## 2024-03-17 NOTE — Telephone Encounter (Signed)
 FYI

## 2024-03-22 ENCOUNTER — Telehealth: Admitting: Physician Assistant

## 2024-03-22 DIAGNOSIS — B9689 Other specified bacterial agents as the cause of diseases classified elsewhere: Secondary | ICD-10-CM | POA: Diagnosis not present

## 2024-03-22 DIAGNOSIS — J019 Acute sinusitis, unspecified: Secondary | ICD-10-CM | POA: Diagnosis not present

## 2024-03-22 MED ORDER — AZITHROMYCIN 250 MG PO TABS: ORAL_TABLET | ORAL | 0 refills | Status: AC

## 2024-03-22 NOTE — Progress Notes (Signed)
 Virtual Visit Consent   Stephanie Espinoza, you are scheduled for a virtual visit with a [facility] provider today. Just as with appointments in the office, your consent must be obtained to participate. Your consent will be active for this visit and any virtual visit you may have with one of our providers in the next 365 days. If you have a MyChart account, a copy of this consent can be sent to you electronically.  As this is a virtual visit, video technology does not allow for your provider to perform a traditional examination. This may limit your provider's ability to fully assess your condition. If your provider identifies any concerns that need to be evaluated in person or the need to arrange testing (such as labs, EKG, etc.), we will make arrangements to do so. Although advances in technology are sophisticated, we cannot ensure that it will always work on either your end or our end. If the connection with a video visit is poor, the visit may have to be switched to a telephone visit. With either a video or telephone visit, we are not always able to ensure that we have a secure connection.  By engaging in this virtual visit, you consent to the provision of healthcare and authorize for your insurance to be billed (if applicable) for the services provided during this visit. Depending on your insurance coverage, you may receive a charge related to this service.  I need to obtain your verbal consent now. Are you willing to proceed with your visit today? Stephanie Espinoza has provided verbal consent on 03/22/2024 for a virtual visit (video or telephone). Angelia Kelp, PA-C  Date: 03/22/2024 10:10 AM   Virtual Visit via Video Note   I, Angelia Kelp, connected with  Stephanie Espinoza  ([id number], 04-19-1956) on 03/22/24 at 10:00 AM EDT by a video-enabled telemedicine application and verified that I am speaking with the correct person using two identifiers.  Location: Patient: Virtual Visit Location  Patient: Home Provider: Virtual Visit Location Provider: Home Office   I discussed the limitations of evaluation and management by telemedicine and the availability of in person appointments. The patient expressed understanding and agreed to proceed.    History of Present Illness: Stephanie Espinoza is a 68 y.o. who identifies as a female who was assigned female at birth, and is being seen today for sinus congestion.  HPI: Sinusitis This is a new problem. The current episode started in the past 7 days. The problem has been gradually worsening since onset. There has been no fever. The pain is moderate. Associated symptoms include congestion, diaphoresis (at night when sleeping), headaches and sinus pressure. Pertinent negatives include no chills, coughing, ear pain, hoarse voice, shortness of breath, sore throat or swollen glands. (Rhinorrhea) Treatments tried: zyrtec, honey lozenges. The treatment provided no relief.     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   Right foot pain 05/22/2022   Right lumbar radiculopathy 05/08/2022   DDD (degenerative disc disease), lumbar 02/12/2022   Anterolisthesis of lumbosacral spine 02/12/2022   Weakness of both legs 03/07/2021   B12 deficiency 03/07/2021   Urinary incontinence 05/16/2020   Other fatigue 05/16/2020   History of syphilis 05/14/2020   Memory loss 02/14/2020   Insomnia 02/14/2020   Herpes zoster 12/31/2019  Normocytic anemia 03/01/2019   Hyperglycemia 03/01/2019   Headache 06/30/2018   Urinary frequency 05/01/2018   Cervical 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 (HPV) DNA test positive 10/18/2016   Essential hypertension 09/16/2014   Vasomotor rhinitis 05/05/2014   Benign paroxysmal positional vertigo 09/21/2012   Sinusitis 03/22/2009    Human immunodeficiency virus (HIV) disease (HCC) 09/06/2006   Genital herpes 09/06/2006   Depression 09/06/2006    Allergies:  Allergies  Allergen Reactions   Gabapentin      Swelling,  (Pt states was pre-gabapentin )   Haldol [Haloperidol] Other (See Comments)    Wipes out her memory   Nevirapine     REACTION: Rash 10/08   Thorazine [Chlorpromazine] Other (See Comments)    "makes me comatose"   Amlodipine  Swelling    Swelling in legs   Augmentin  [Amoxicillin -Pot Clavulanate] Nausea And Vomiting and Other (See Comments)    Severe abdominal cramping and GI upset   Medications:  Current Outpatient Medications:    azithromycin  (ZITHROMAX ) 250 MG tablet, Take 2 tablets on day 1, then 1 tablet daily on days 2 through 5, Disp: 6 tablet, Rfl: 0   acyclovir  ointment (ZOVIRAX ) 5 %, APPLY TOPICALLY EVERY 3 (THREE) HOURS. USE AS NEEDED FOR SYMPTOMS RELIEF FOR NO MORE THAN 7 DAYS., Disp: 15 g, Rfl: 3   albuterol  (VENTOLIN  HFA) 108 (90 Base) MCG/ACT inhaler, INHALE 1-2 PUFFS INTO THE LUNGS EVERY 4 HOURS AS NEEDED FOR WHEEZING OR SHORTNESS OF BREATH., Disp: 18 each, Rfl: 2   amitriptyline  (ELAVIL ) 10 MG tablet, Take 10 mg by mouth at bedtime., Disp: , Rfl:    amLODipine  (NORVASC ) 10 MG tablet, Take 1 tablet (10 mg total) by mouth daily., Disp: 90 tablet, Rfl: 0   bictegravir-emtricitabine -tenofovir  AF (BIKTARVY ) 50-200-25 MG TABS tablet, Take 1 tablet by mouth daily. (Patient not taking: Reported on 03/11/2024), Disp: 90 tablet, Rfl: 1   cholecalciferol (VITAMIN D3) 25 MCG (1000 UNIT) tablet, Take 1,000 Units by mouth daily. gummie, Disp: , Rfl:    cyclobenzaprine  (FLEXERIL ) 5 MG tablet, TAKE 1 TABLET BY MOUTH THREE TIMES A DAY AS NEEDED FOR MUSCLE SPASM, Disp: 30 tablet, Rfl: 1   dolutegravir -rilpivirine (JULUCA ) 50-25 MG tablet, Take 1 tablet by mouth daily before lunch. (Patient not taking: Reported on 03/11/2024), Disp: 30 tablet, Rfl: 11   EPINEPHrine  (EPIPEN  2-PAK) 0.3 mg/0.3 mL IJ SOAJ injection,  Inject 0.3 mg into the muscle as needed for anaphylaxis., Disp: 1 each, Rfl: 2   fluticasone  (FLONASE ) 50 MCG/ACT nasal spray, Place 2 sprays into both nostrils daily., Disp: 48 mL, Rfl: 2   gabapentin  (NEURONTIN ) 100 MG capsule, Take 100 mg by mouth at bedtime., Disp: , Rfl:    hydrochlorothiazide  (HYDRODIURIL ) 25 MG tablet, Take 1 tablet (25 mg total) by mouth daily., Disp: 90 tablet, Rfl: 3   lidocaine  (LIDODERM ) 5 %, Place 1 patch onto the skin daily. Remove & Discard patch within 12 hours or as directed by MD, Disp: 90 patch, Rfl: 3   Multiple Vitamin (MULTIVITAMIN) tablet, Take 2 tablets by mouth daily. gummies, Disp: , Rfl:    spironolactone  (ALDACTONE ) 25 MG tablet, Take 1 tablet (25 mg total) by mouth daily., Disp: 90 tablet, Rfl: 0   sulfamethoxazole -trimethoprim  (BACTRIM  DS) 800-160 MG tablet, TAKE 1 TABLET BY MOUTH EVERY DAY, Disp: 30 tablet, Rfl: 3   traMADol (ULTRAM) 50 MG tablet, Take 50 mg by mouth every 4 (four) hours as needed., Disp: , Rfl:    valACYclovir  (VALTREX ) 500 MG tablet, Take 500 mg by  mouth 2 (two) times daily. TAKES AS PRN, Disp: , Rfl:   Observations/Objective: Patient is well-developed, well-nourished in no acute distress.  Resting comfortably at home.  Head is normocephalic, atraumatic.  No labored breathing.  Speech is clear and coherent with logical content.  Patient is alert and oriented at baseline.    Assessment and Plan: 1. Acute bacterial sinusitis (Primary) - azithromycin  (ZITHROMAX ) 250 MG tablet; Take 2 tablets on day 1, then 1 tablet daily on days 2 through 5  Dispense: 6 tablet; Refill: 0  - Worsening symptoms that have not responded to OTC medications.  - Will give Azithromycin , patient prefers; Augmentin  causes GI upset - Continue allergy medications.  - Steam and humidifier can help - Stay well hydrated and get plenty of rest.  - Seek in person evaluation if no symptom improvement or if symptoms worsen   Follow Up Instructions: I  discussed the assessment and treatment plan with the patient. The patient was provided an opportunity to ask questions and all were answered. The patient agreed with the plan and demonstrated an understanding of the instructions.  A copy of instructions were sent to the patient via MyChart unless otherwise noted below.    The patient was advised to call back or seek an in-person evaluation if the symptoms worsen or if the condition fails to improve as anticipated.    Angelia Kelp, PA-C

## 2024-03-22 NOTE — Patient Instructions (Signed)
 Laquita Plant, thank you for joining Angelia Kelp, PA-C for today's virtual visit.  While this provider is not your primary care provider (PCP), if your PCP is located in our provider database this encounter information will be shared with them immediately following your visit.   A [facility] MyChart account gives you access to today's visit and all your visits, tests, and labs performed at [facility] " click here if you don't have a  MyChart account or go to [email or web address]  Consent: (Patient) SHENISE WOLGAMOTT provided verbal consent for this virtual visit at the beginning of the encounter.  Current Medications:  Current Outpatient Medications:    azithromycin  (ZITHROMAX ) 250 MG tablet, Take 2 tablets on day 1, then 1 tablet daily on days 2 through 5, Disp: 6 tablet, Rfl: 0   acyclovir  ointment (ZOVIRAX ) 5 %, APPLY TOPICALLY EVERY 3 (THREE) HOURS. USE AS NEEDED FOR SYMPTOMS RELIEF FOR NO MORE THAN 7 DAYS., Disp: 15 g, Rfl: 3   albuterol  (VENTOLIN  HFA) 108 (90 Base) MCG/ACT inhaler, INHALE 1-2 PUFFS INTO THE LUNGS EVERY 4 HOURS AS NEEDED FOR WHEEZING OR SHORTNESS OF BREATH., Disp: 18 each, Rfl: 2   amitriptyline  (ELAVIL ) 10 MG tablet, Take 10 mg by mouth at bedtime., Disp: , Rfl:    amLODipine  (NORVASC ) 10 MG tablet, Take 1 tablet (10 mg total) by mouth daily., Disp: 90 tablet, Rfl: 0   bictegravir-emtricitabine -tenofovir  AF (BIKTARVY ) 50-200-25 MG TABS tablet, Take 1 tablet by mouth daily. (Patient not taking: Reported on 03/11/2024), Disp: 90 tablet, Rfl: 1   cholecalciferol (VITAMIN D3) 25 MCG (1000 UNIT) tablet, Take 1,000 Units by mouth daily. gummie, Disp: , Rfl:    cyclobenzaprine  (FLEXERIL ) 5 MG tablet, TAKE 1 TABLET BY MOUTH THREE TIMES A DAY AS NEEDED FOR MUSCLE SPASM, Disp: 30 tablet, Rfl: 1   dolutegravir -rilpivirine (JULUCA ) 50-25 MG tablet, Take 1 tablet by mouth daily before lunch. (Patient not taking: Reported on 03/11/2024), Disp: 30 tablet, Rfl:  11   EPINEPHrine  (EPIPEN  2-PAK) 0.3 mg/0.3 mL IJ SOAJ injection, Inject 0.3 mg into the muscle as needed for anaphylaxis., Disp: 1 each, Rfl: 2   fluticasone  (FLONASE ) 50 MCG/ACT nasal spray, Place 2 sprays into both nostrils daily., Disp: 48 mL, Rfl: 2   gabapentin  (NEURONTIN ) 100 MG capsule, Take 100 mg by mouth at bedtime., Disp: , Rfl:    hydrochlorothiazide  (HYDRODIURIL ) 25 MG tablet, Take 1 tablet (25 mg total) by mouth daily., Disp: 90 tablet, Rfl: 3   lidocaine  (LIDODERM ) 5 %, Place 1 patch onto the skin daily. Remove & Discard patch within 12 hours or as directed by MD, Disp: 90 patch, Rfl: 3   Multiple Vitamin (MULTIVITAMIN) tablet, Take 2 tablets by mouth daily. gummies, Disp: , Rfl:    spironolactone  (ALDACTONE ) 25 MG tablet, Take 1 tablet (25 mg total) by mouth daily., Disp: 90 tablet, Rfl: 0   sulfamethoxazole -trimethoprim  (BACTRIM  DS) 800-160 MG tablet, TAKE 1 TABLET BY MOUTH EVERY DAY, Disp: 30 tablet, Rfl: 3   traMADol (ULTRAM) 50 MG tablet, Take 50 mg by mouth every 4 (four) hours as needed., Disp: , Rfl:    valACYclovir  (VALTREX ) 500 MG tablet, Take 500 mg by mouth 2 (two) times daily. TAKES AS PRN, Disp: , Rfl:    Medications ordered in this encounter:  Meds ordered this encounter  Medications   azithromycin  (ZITHROMAX ) 250 MG tablet    Sig: Take 2 tablets on day 1, then 1 tablet daily on days 2 through 5  Dispense:  6 tablet    Refill:  0    Supervising Provider:   LAMPTEY, PHILIP O [[id number]]     *If you need refills on other medications prior to your next appointment, please contact your pharmacy*  Follow-Up: Call back or seek an in-person evaluation if the symptoms worsen or if the condition fails to improve as anticipated.  [facility] [phone]  Other Instructions Sinus Infection, Adult A sinus infection, also called sinusitis, is inflammation of your sinuses. Sinuses are hollow spaces in the bones around your face. Your sinuses are  located: Around your eyes. In the middle of your forehead. Behind your nose. In your cheekbones. Mucus normally drains out of your sinuses. When your nasal tissues become inflamed or swollen, mucus can become trapped or blocked. This allows bacteria, viruses, and fungi to grow, which leads to infection. Most infections of the sinuses are caused by a virus. A sinus infection can develop quickly. It can last for up to 4 weeks (acute) or for more than 12 weeks (chronic). A sinus infection often develops after a cold. What are the causes? This condition is caused by anything that creates swelling in the sinuses or stops mucus from draining. This includes: Allergies. Asthma. Infection from bacteria or viruses. Deformities or blockages in your nose or sinuses. Abnormal growths in the nose (nasal polyps). Pollutants, such as chemicals or irritants in the air. Infection from fungi. This is rare. What increases the risk? You are more likely to develop this condition if you: Have a weak body defense system (immune system). Do a lot of swimming or diving. Overuse nasal sprays. Smoke. What are the signs or symptoms? The main symptoms of this condition are pain and a feeling of pressure around the affected sinuses. Other symptoms include: Stuffy nose or congestion that makes it difficult to breathe through your nose. Thick yellow or greenish drainage from your nose. Tenderness, swelling, and warmth over the affected sinuses. A cough that may get worse at night. Decreased sense of smell and taste. Extra mucus that collects in the throat or the back of the nose (postnasal drip) causing a sore throat or bad breath. Tiredness (fatigue). Fever. How is this diagnosed? This condition is diagnosed based on: Your symptoms. Your medical history. A physical exam. Tests to find out if your condition is acute or chronic. This may include: Checking your nose for nasal polyps. Viewing your sinuses using a  device that has a light (endoscope). Testing for allergies or bacteria. Imaging tests, such as an MRI or CT scan. In rare cases, a bone biopsy may be done to rule out more serious types of fungal sinus disease. How is this treated? Treatment for a sinus infection depends on the cause and whether your condition is chronic or acute. If caused by a virus, your symptoms should go away on their own within 10 days. You may be given medicines to relieve symptoms. They include: Medicines that shrink swollen nasal passages (decongestants). A spray that eases inflammation of the nostrils (topical intranasal corticosteroids). Rinses that help get rid of thick mucus in your nose (nasal saline washes). Medicines that treat allergies (antihistamines). Over-the-counter pain relievers. If caused by bacteria, your health care provider may recommend waiting to see if your symptoms improve. Most bacterial infections will get better without antibiotic medicine. You may be given antibiotics if you have: A severe infection. A weak immune system. If caused by narrow nasal passages or nasal polyps, surgery may be  needed. Follow these instructions at home: Medicines Take, use, or apply over-the-counter and prescription medicines only as told by your health care provider. These may include nasal sprays. If you were prescribed an antibiotic medicine, take it as told by your health care provider. Do not stop taking the antibiotic even if you start to feel better. Hydrate and humidify  Drink enough fluid to keep your urine pale yellow. Staying hydrated will help to thin your mucus. Use a cool mist humidifier to keep the humidity level in your home above 50%. Inhale steam for 10-15 minutes, 3-4 times a day, or as told by your health care provider. You can do this in the bathroom while a hot shower is running. Limit your exposure to cool or dry air. Rest Rest as much as possible. Sleep with your head raised  (elevated). Make sure you get enough sleep each night. General instructions  Apply a warm, moist washcloth to your face 3-4 times a day or as told by your health care provider. This will help with discomfort. Use nasal saline washes as often as told by your health care provider. Wash your hands often with soap and water  to reduce your exposure to germs. If soap and water  are not available, use hand sanitizer. Do not smoke. Avoid being around people who are smoking (secondhand smoke). Keep all follow-up visits. This is important. Contact a health care provider if: You have a fever. Your symptoms get worse. Your symptoms do not improve within 10 days. Get help right away if: You have a severe headache. You have persistent vomiting. You have severe pain or swelling around your face or eyes. You have vision problems. You develop confusion. Your neck is stiff. You have trouble breathing. These symptoms may be an emergency. Get help right away. Call 911. Do not wait to see if the symptoms will go away. Do not drive yourself to the hospital. Summary A sinus infection is soreness and inflammation of your sinuses. Sinuses are hollow spaces in the bones around your face. This condition is caused by nasal tissues that become inflamed or swollen. The swelling traps or blocks the flow of mucus. This allows bacteria, viruses, and fungi to grow, which leads to infection. If you were prescribed an antibiotic medicine, take it as told by your health care provider. Do not stop taking the antibiotic even if you start to feel better. Keep all follow-up visits. This is important. This information is not intended to replace advice given to you by your health care provider. Make sure you discuss any questions you have with your health care provider. Document Revised: 09/25/2021 Document Reviewed: 09/25/2021 Elsevier Patient Education  2024 [employer]   If you have been instructed to have an in-person  evaluation today at a local Urgent Care facility, please use the link below. It will take you to a list of all of our available [facility], including address, phone number and hours of operation. Please do not delay care.  [facility]  If you or a family member do not have a primary care provider, use the link below to schedule a visit and establish care. When you choose a [facility] primary care physician or advanced practice provider, you gain a long-term partner in health. Find a Primary Care Provider  Learn more about [facility]'s in-office and virtual care options: [facility] - Get Care Now

## 2024-03-26 ENCOUNTER — Other Ambulatory Visit: Admitting: Pharmacist

## 2024-03-26 ENCOUNTER — Encounter: Payer: Self-pay | Admitting: Internal Medicine

## 2024-03-26 DIAGNOSIS — I10 Essential (primary) hypertension: Secondary | ICD-10-CM

## 2024-03-26 LAB — HIV-1 GENOTYPING (RTI,PI,IN INHBTR)

## 2024-03-26 NOTE — Progress Notes (Signed)
 03/26/2024 Name: Stephanie Espinoza MRN: [id number] DOB: 1956-02-05  Chief Complaint  Patient presents with   Hypertension   Medication Management    Stephanie Espinoza is a 68 y.o. year old female who presented for a telephone visit.   They were referred to the pharmacist by their Case Management Team  for assistance in managing hypertension.    Subjective:  Care Team: Primary Care Provider: [name], MD ; Next Scheduled Visit: none scheduled   Medication Access  Current Pharmacy:  [id number] - [place], [place] - [address] [place] [address] Phone: [phone] Fax: [phone]   Hypertension:  Current medications: spironolactone  25 mg daily (new start 03/11/24) Medications previously tried: valsartan  40 mg - stopped due to tongue swelling. No swelling occurred on valsartan  20 mg. Stopped amlodipine  10 mg due to ankle edema   Hydrochlorothiazide  was prescribed in the past 2017-2018 per chart however pt notes she did not take BP medication prior to valsartan  and does not recall that medication  Patient has a validated, automated, upper arm home BP cuff Current blood pressure readings:  151/98 yesterday 11 AM after medication 5/14 100/59 at [facility] 5/13 160/99  Pt reports no difference noted in days reported above to cause a much lower reading on 5/14  Patient reports fatigue and SOB with exertion. No worsened SOB when lying down. Pt states swelling in ankles has resolved since d/c amlodipine  and started spironolactone  Patient reports weight gain of 40 lbs, current 184 lbs. She was 168 lbs 10/2023, 145 lbs 03/24/2023.   Objective:  Lab Results  Component Value Date   HGBA1C 5.9 02/04/2022    Lab Results  Component Value Date   CREATININE 1.22 (H) 01/05/2024   BUN 16 01/05/2024   NA 141 01/05/2024   K 3.7 01/05/2024   CL 105 01/05/2024   CO2 26 01/05/2024    Lab Results  Component Value Date   CHOL 175 08/04/2023   HDL  49 (L) 08/04/2023   LDLCALC 102 (H) 08/04/2023   LDLDIRECT 102.1 03/02/2009   TRIG 147 08/04/2023   CHOLHDL 3.6 08/04/2023    Medications Reviewed Today     Reviewed by Dion Frankel, RPH (Pharmacist) on 03/26/24 at 1119  Med List Status: <None>   Medication Order Taking? Sig Documenting Provider Last Dose Status Informant  acyclovir  ointment (ZOVIRAX ) 5 % [id number]  APPLY TOPICALLY EVERY 3 (THREE) HOURS. USE AS NEEDED FOR SYMPTOMS RELIEF FOR NO MORE THAN 7 DAYS. [name], CNM  Active   albuterol  (VENTOLIN  HFA) 108 (90 Base) MCG/ACT inhaler [id number]  INHALE 1-2 PUFFS INTO THE LUNGS EVERY 4 HOURS AS NEEDED FOR WHEEZING OR SHORTNESS OF BREATH. [name], MD  Active   amitriptyline  (ELAVIL ) 10 MG tablet [id number]  Take 10 mg by mouth at bedtime. [provider]  Active            Med Note [name], JUANA M   Fri Feb 27, 2024 10:07 AM) as needed  azithromycin  (ZITHROMAX ) 250 MG tablet [id number]  Take 2 tablets on day 1, then 1 tablet daily on days 2 through 5 Burnette, Jennifer M, PA-C  Active   bictegravir-emtricitabine -tenofovir  AF (BIKTARVY ) 50-200-25 MG TABS tablet [id number]  Take 1 tablet by mouth daily.  Patient not taking: Reported on 03/11/2024   [name], MD  Active   cholecalciferol (VITAMIN D3) 25 MCG (1000 UNIT) tablet [id number]  Take 1,000 Units by mouth daily. gummie [provider]  Active   cyclobenzaprine  (FLEXERIL ) 5 MG tablet [id number]  TAKE 1 TABLET BY MOUTH THREE TIMES A DAY AS NEEDED FOR MUSCLE SPASM [name], MD  Active   dolutegravir -rilpivirine (JULUCA ) 50-25 MG tablet [id number]  Take 1 tablet by mouth daily before lunch.  Patient not taking: Reported on 03/11/2024   [name], MD  Active   EPINEPHrine  (EPIPEN  2-PAK) 0.3 mg/0.3 mL IJ SOAJ injection [id number]  Inject 0.3 mg into the muscle as needed for anaphylaxis. [name], MD  Active   fluticasone  (FLONASE ) 50 MCG/ACT nasal spray [id number]  Place 2  sprays into both nostrils daily. [name], MD  Active            Med Note [name], [name]   Fri Feb 27, 2024 10:09 AM) As needed  gabapentin  (NEURONTIN ) 100 MG capsule [id number]  Take 100 mg by mouth at bedtime. [provider]  Active            Med Note [name], [facility] S   Tue Jul 08, 2023  8:21 AM) As needed  lidocaine  (LIDODERM ) 5 % [id number]  Place 1 patch onto the skin daily. Remove & Discard patch within 12 hours or as directed by MD [name], MD  Active            Med Note [name], JUANA M   Fri Feb 27, 2024 10:10 AM) As needed  Multiple Vitamin (MULTIVITAMIN) tablet [id number]  Take 2 tablets by mouth daily. gummies [provider]  Active   spironolactone  (ALDACTONE ) 25 MG tablet [id number] Yes Take 1 tablet (25 mg total) by mouth daily. [name], MD Taking Active   sulfamethoxazole -trimethoprim  (BACTRIM  DS) 800-160 MG tablet [id number]  TAKE 1 TABLET BY MOUTH EVERY DAY [name], MD  Active   traMADol (ULTRAM) 50 MG tablet [id number]  Take 50 mg by mouth every 4 (four) hours as needed. [provider]  Active   valACYclovir  (VALTREX ) 500 MG tablet [id number]  Take 500 mg by mouth 2 (two) times daily. TAKES AS PRN [provider]  Active               Assessment/Plan:   Hypertension: - Currently uncontrolled. BP goal <130/80.  - Recommended to continue checking home blood pressure and heart rate  - Recommend to continue spironolactone  25 mg daily. Needs BMP check. Can consider increasing to 50 mg if K and SCr is appropriate. - Given her reported sx of SOB, fatigue with exertion, weight gain, I have advised her to follow up with PCP to address those concerns and get updated BMP prior to recommending change in medication   Rainelle Bur, PharmD, BCPS, CPP Clinical Pharmacist Practitioner [facility] [phone]

## 2024-03-30 ENCOUNTER — Other Ambulatory Visit: Payer: Self-pay

## 2024-03-30 ENCOUNTER — Other Ambulatory Visit

## 2024-03-30 NOTE — Patient Outreach (Signed)
 Complex Care Management   Visit Note  03/30/2024  Name:  Stephanie Espinoza MRN: [id number] DOB: [birth year]  Situation: Referral received for Complex Care Management related to HTN I obtained verbal consent from Patient.  Visit completed with patient  on the phone  Background:   Past Medical History:  Diagnosis Date   Abnormal Pap smear of cervix before 1995   always a repeat back to normal except for 1 time and had colpo bioosy -resutls were normal   Allergic rhinitis    Allergy    Anxiety    Bell's palsy after 1995 ?   no residual SE.   Depression    Genital herpes    History of drug abuse (HCC)    drug usser 1984 - 1990.  cocaine   HIV (human immunodeficiency virus infection) (HCC) 1990   from sexual contact who is unknown   Hypertension    Syphilis    treated    Assessment: Patient Reported Symptoms:  Cognitive Cognitive Status: Alert and oriented to person, place, and time, Normal speech and language skills, Insightful and able to interpret abstract concepts      Neurological Neurological Review of Symptoms: No symptoms reported    HEENT HEENT Symptoms Reported: No symptoms reported      Cardiovascular Cardiovascular Symptoms Reported: No symptoms reported Does patient have uncontrolled Hypertension?: Yes Is patient checking Blood Pressure at home?: Yes Patient's Recent BP reading at home: 151/105 pulse 99 today; and 153/103 pulse 98 yesterday pulse    Respiratory Respiratory Symptoms Reported: No symptoms reported    Endocrine Patient reports the following symptoms related to hypoglycemia or hyperglycemia : No symptoms reported    Gastrointestinal Gastrointestinal Symptoms Reported: No symptoms reported      Genitourinary Genitourinary Symptoms Reported: No symptoms reported    Integumentary Integumentary Symptoms Reported: No symptoms reported    Musculoskeletal Musculoskelatal Symptoms Reviewed: Difficulty walking Musculoskeletal Conditions: Other Other  Musculoskeletal Conditions: peripheral neuropathy Musculoskeletal Management Strategies: Medication therapy, Exercise, Routine screening Musculoskeletal Self-Management Outcome: 4 (good) Falls in the past year?: No    Psychosocial Psychosocial Symptoms Reported: No symptoms reported            03/11/2024    2:22 PM  Depression screen PHQ 2/9  Decreased Interest 0  Down, Depressed, Hopeless 0  PHQ - 2 Score 0  Altered sleeping 0  Tired, decreased energy 0  Change in appetite 0  Feeling bad or failure about yourself  0  Trouble concentrating 0  Moving slowly or fidgety/restless 0  Suicidal thoughts 0  PHQ-9 Score 0  Difficult doing work/chores Not difficult at all    Vitals:   03/30/24 1311  BP: (!) 151/105  Pulse: 99    Medications Reviewed Today     Reviewed by Trenesha Alcaide M, RN (Registered Nurse) on 03/30/24 at 1323  Med List Status: <None>   Medication Order Taking? Sig Documenting Provider Last Dose Status Informant  acyclovir  ointment (ZOVIRAX ) 5 % [id number] Yes APPLY TOPICALLY EVERY 3 (THREE) HOURS. USE AS NEEDED FOR SYMPTOMS RELIEF FOR NO MORE THAN 7 DAYS. [name], CNM Taking Active   albuterol  (VENTOLIN  HFA) 108 (90 Base) MCG/ACT inhaler [id number] Yes INHALE 1-2 PUFFS INTO THE LUNGS EVERY 4 HOURS AS NEEDED FOR WHEEZING OR SHORTNESS OF BREATH. [name], MD Taking Active   amitriptyline  (ELAVIL ) 10 MG tablet [id number] Yes Take 10 mg by mouth at bedtime. [provider] Taking Active  Med Note [name], Makaveli Hoard M   Fri Feb 27, 2024 10:07 AM) as needed  bictegravir-emtricitabine -tenofovir  AF (BIKTARVY ) 50-200-25 MG TABS tablet [id number]  Take 1 tablet by mouth daily.  Patient not taking: Reported on 03/11/2024   [name], MD  Active   cholecalciferol (VITAMIN D3) 25 MCG (1000 UNIT) tablet [id number] Yes Take 1,000 Units by mouth daily. gummie [provider] Taking Active   cyclobenzaprine  (FLEXERIL ) 5 MG tablet  [id number] Yes TAKE 1 TABLET BY MOUTH THREE TIMES A DAY AS NEEDED FOR MUSCLE SPASM [name], MD Taking Active   dolutegravir -rilpivirine (JULUCA ) 50-25 MG tablet [id number]  Take 1 tablet by mouth daily before lunch.  Patient not taking: Reported on 03/11/2024   [name], MD  Active   EPINEPHrine  (EPIPEN  2-PAK) 0.3 mg/0.3 mL IJ SOAJ injection [id number] Yes Inject 0.3 mg into the muscle as needed for anaphylaxis. [name], MD Taking Active   fluticasone  (FLONASE ) 50 MCG/ACT nasal spray [id number] Yes Place 2 sprays into both nostrils daily. [name], MD Taking Active            Med Note [name], Euel Castile M   Fri Feb 27, 2024 10:09 AM) As needed  gabapentin  (NEURONTIN ) 100 MG capsule [id number] Yes Take 100 mg by mouth at bedtime. [provider] Taking Active            Med Note [name], [facility] S   Tue Jul 08, 2023  8:21 AM) As needed  lidocaine  (LIDODERM ) 5 % [id number] Yes Place 1 patch onto the skin daily. Remove & Discard patch within 12 hours or as directed by MD [name], MD Taking Active            Med Note [name], Kamaree Wheatley M   Fri Feb 27, 2024 10:10 AM) As needed  Multiple Vitamin (MULTIVITAMIN) tablet [id number] Yes Take 2 tablets by mouth daily. gummies [provider] Taking Active   spironolactone  (ALDACTONE ) 25 MG tablet [id number] Yes Take 1 tablet (25 mg total) by mouth daily. [name], MD Taking Active   sulfamethoxazole -trimethoprim  (BACTRIM  DS) 800-160 MG tablet [id number] Yes TAKE 1 TABLET BY MOUTH EVERY DAY [name], MD Taking Active   traMADol (ULTRAM) 50 MG tablet [id number] Yes Take 50 mg by mouth every 4 (four) hours as needed. [provider] Taking Active   valACYclovir  (VALTREX ) 500 MG tablet [id number] Yes Take 500 mg by mouth 2 (two) times daily. TAKES AS PRN [provider] Taking Active           Recommendation:   PCP Follow-up-Patient reports she will call to schedule  follow up re: HTN Patient to continue to check BP readings and update primary care provider Patient to discussed Target BP range with PCP at next visit.  Follow Up Plan:   Telephone follow up appointment date/time:  04/29/24 at 1:00 pm  Lindi Revering, RN, MSN, BSN, CCM [place]  [facility], Population Health Case Manager Phone: [phone]

## 2024-03-30 NOTE — Telephone Encounter (Signed)
 [facility] techs checking why lab was cancelled. Will update provider.

## 2024-03-30 NOTE — Patient Instructions (Signed)
 Visit Information  Thank you for taking time to visit with me today. Please don't hesitate to contact me if I can be of assistance to you before our next scheduled appointment.  Our next appointment is by telephone on 04/29/24 at 1:00 pm Please call the care guide team at [phone] if you need to cancel or reschedule your appointment.   Following is a copy of your care plan:   Goals Addressed             This Visit's Progress    VBCI RN Care Plan       Problems:  Chronic Disease Management support and education needs related to HTN No Advanced Directives in place-patient reports she has received AD packet.  Goal: Over the next 90 days the Patient will collaborate with the care management team towards completion of advanced directives and report receipt of packet as evidenced by patient report  take all medications exactly as prescribed and will call provider for medication related questions as evidenced by patient report or review of chart    verbalize understanding of plan for management of HTN as evidenced by patient report and review of chart  Interventions:   Hypertension Interventions: Last practice recorded BP readings:  BP Readings from Last 3 Encounters:  03/30/24 (!) 151/105  02/27/24 (!) 162/106  02/13/24 (!) 148/96   Most recent eGFR/CrCl:  Lab Results  Component Value Date   EGFR 49 (L) 01/05/2024    No components found for: "CRCL"  Evaluation of current treatment plan related to hypertension self management and patient's adherence to plan as established by provider Reviewed medications with patient and discussed importance of compliance Reviewed scheduled/upcoming provider appointments including:  Screening for signs and symptoms of depression related to chronic disease state  Advised patient to contact Primary care provider and schedule a follow up appointment regarding BP RNCM reviewed recommendations per clinical pharmacist telephone visit on 03/12/24.  RNCM advised patient to follow up with primary care re: BP readings as recommended by clinical pharmacist. Reiterated hypertensive crisis and when to seek medical attention-Do not wait to see whether your blood pressure comes down on its own. Call 911. per american heart association: Call 911 if your blood pressure is 180/120 or higher and you are experiencing symptoms that may include: Chest pain Shortness of breath Back pain Numbness Weakness Change in vision Difficulty speaking Reiterated foods high in salt Advised to discuss with primary care provider for recommended Target range. Advised patient to take BP monitor to next provider office and request check for accuracy against a manual reading. Confirmed patient received AD packet sent via mail.  Patient Self-Care Activities:  take blood pressure log to all doctor appointments call doctor for signs and symptoms of high blood pressure keep all doctor appointments take medications for blood pressure exactly as prescribed report new symptoms to your doctor Continue to follow a low salt diet Per American Heart Association Recommendations: Call 911 if your blood pressure is 180/120 or higher; Or if you are experiencing symptoms that may include: chest pain; shortness of breath, back pain, numbness, weakness, change in vision or difficulty speaking.  Plan:  Telephone follow up appointment with care management team member scheduled for:  04/29/24 at 1:00 pm             Please call the Suicide and Crisis Lifeline: 988 call the [place]  National Suicide Prevention Lifeline: 747-541-9571 or TTY: 205 385 3993 TTY 7872149758) to talk to a trained counselor if you are experiencing  a Mental Health or Behavioral Health Crisis or need someone to talk to.  Patient verbalizes understanding of instructions and care plan provided today and agrees to view in MyChart. Active MyChart status and patient understanding of how to access instructions  and care plan via MyChart confirmed with patient.     Lindi Revering, RN, MSN, BSN, CCM [place]  [facility], Population Health Case Manager Phone: [phone]

## 2024-04-02 NOTE — Progress Notes (Signed)
 The 10-year ASCVD risk score (Arnett DK, et al., 2019) is: 10%   Values used to calculate the score:     Age: 68 years     Sex: Female     Is Non-Hispanic African American: No     Diabetic: No     Tobacco smoker: No     Systolic Blood Pressure: 151 mmHg     Is BP treated: Yes     HDL Cholesterol: 49 mg/dL     Total Cholesterol: 175 mg/dL  No current statin therapy, next appointment note updated.   Brihany Butch, BSN, RN

## 2024-04-06 NOTE — Telephone Encounter (Signed)
 [facility] has been contacted. Awaiting additional information.

## 2024-04-06 NOTE — Telephone Encounter (Signed)
 Per [facility] the HIV Genotype test can report as " UNABLE TO REPORT " due to the test needing a certain amount of virus (HIV RNA) in the blood typically [phone] copies/mL. If the VL is too low or undetectable, there may not be enough virus in the blood for the test to find and analyze.   Alternatively the provider can  use Archive testing.   Staff explained this to patient and patient verbalized understanding.   Routing to provider if ok to order [employer] testing. Patient ok with provider recommendations.  Future appointment scheduled.   Provider made aware via secure chat messaging.   Hendricks Locker, LPN

## 2024-04-06 NOTE — Telephone Encounter (Signed)
 Secure chat message received by Dr. Levern Reader repeat HIV-1 Genotyping (RTI,PI,IN Hanna Lewandowsky)   Order placed. Patient aware.  Hendricks Locker, LPN

## 2024-04-07 ENCOUNTER — Ambulatory Visit: Admitting: Internal Medicine

## 2024-04-12 ENCOUNTER — Other Ambulatory Visit: Payer: Self-pay

## 2024-04-12 ENCOUNTER — Other Ambulatory Visit

## 2024-04-12 DIAGNOSIS — B20 Human immunodeficiency virus [HIV] disease: Secondary | ICD-10-CM

## 2024-04-13 ENCOUNTER — Encounter: Payer: Self-pay | Admitting: *Deleted

## 2024-04-13 ENCOUNTER — Other Ambulatory Visit: Payer: Self-pay | Admitting: *Deleted

## 2024-04-13 LAB — T-HELPER CELL (CD4) - (RCID CLINIC ONLY)
CD4 % Helper T Cell: 10 % — ABNORMAL LOW (ref 33–65)
CD4 T Cell Abs: 131 /uL — ABNORMAL LOW (ref 400–1790)

## 2024-04-13 NOTE — Patient Outreach (Signed)
 Aging Gracefully Program  RN Visit  04/13/2024  [name] 01/16/1956 [id number]  Visit:  RN Visit Number: 4- Fourth Visit  RN TIME CALCULATION: Start TIme:  RN Start Time Calculation: 1200 End Time:  RN Stop Time Calculation: 1340 Total Minutes:  RN Time Calculation: 100  Readiness To Change Score:  Readiness to Change Score: 10  Universal RN Interventions: Calendar Distribution: No Exercise Review: Yes Medications: Yes Medication Changes: No Mood: Yes Pain: Yes PCP Advocacy/Support: No Fall Prevention: Yes Incontinence: Yes Clinician View Of Client Situation: Arrived for home visit. Ms. Stephanie Espinoza answered door. Ambulating independently without assistive device. Ms. Stephanie Espinoza gait was even and without signifcant limping. Client View Of His/Her Situation: Ms. Stephanie Espinoza reports her dog Sticky passed 2 weeks ago. Ms. Stephanie Espinoza states she misses Sticky. States the house is more quiet now. Ms. Stephanie Espinoza states she encourages her cat to come out more often now.  Healthcare Provider Communication: Did Surveyor, mining With [employer] Provider?: No Healthcare Provider Response According to RN: n/a According to Client, Did PCP Report Communication With An Aging Gracefully RN?: No Healthcare Provider Response According To Client: n/a  Clinician View of Client Situation: Clinician View Of Client Situation: Arrived for home visit. Ms. Stephanie Espinoza answered door. Ambulating independently without assistive device. Ms. Stephanie Espinoza gait was even and without signifcant limping. Client's View of His/Her Situation: Client View Of His/Her Situation: Ms. Stephanie Espinoza reports her dog Sticky passed 2 weeks ago. Ms. Stephanie Espinoza states she misses Sticky. States the house is more quiet now. Ms. Stephanie Espinoza states she encourages her cat to come out more often now.  Medication Assessment: Reviewed.     OT Update: Pending CHS completion.   Session Summary: Ms. Stephanie Espinoza is pleased with CHS work thus far. Currently CHS is working on back deck.  Grab bars have been installed in the bathrooms. Ms. Stephanie Espinoza is managing after recent dog's passing by staying busy and enjoying time with her cat. Ms. Stephanie Espinoza has been a pleasure to work with.    Goals Addressed               This Visit's Progress     COMPLETED: AG RN (pt-stated)        01/06/24  Assessment: Ms. Stephanie Espinoza reports she will like to get outside more. Reports she will like to walk around the neighborhood when the weather gets warmer. Reports having a cell phone pouch to place around her neck and a smart watch for emergencies when walking outside. 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 Ms. Stephanie Espinoza has rollator, walker, and cane for ambulation. Uses her cane most days.  Interventions: Encouraged Ms. Stephanie Espinoza to ask neighbors and/or daughter to walk outside with her. Encouraged Ms. Stephanie Espinoza not to walk outside alone. Encouraged Ms. Stephanie Espinoza to call [facility] (closest to her house) to see if they offer senior exercise programs similar to the other centers. Provided calendar book, writer's contact information, and [place] [facility] pamphlet.  Plan: Scheduled next home visit for 02/10/24 at 12 noon.   Stephanie Batty, MSN, RN, BSN [place]  [facility], Healthy Communities RN Case Manager for Aging Gracefully Direct Dial: [phone]     CLIENT/RN ACTION PLAN - GENERIC - (smartphrase AGRNGENERIC)  Registered Nurse:  Stephanie Batty, RN Date: 01/06/2024  Client Name: Stephanie Espinoza Client ID:    Target Area:  Stephanie Espinoza   Why Problem May Occur:  When not feeling well enough or independent enough to go outside  Target Goal:  To go outside and walk (short distances) with someone else, for 1-2 times a week over the next 160 days.    STRATEGIES Coping Strategies: Ideas  Exercise/Activity Reviewed 01/06/24 Reviewed 02/10/24 (exercise booklet) Reviewed 03/04/24 Reviewed 04/13/24 Aging Gracefully exercises, Silver Sneaker program, senior group exercises at the  recreation center, walking outside (short distances)  Involving others with exercises Reviewed 01/06/24 Reviewed 02/10/24 Reviewed 03/04/24 Reviewed 04/13/24 Asking family/neighbors to participate with walks     Safety Reviewed 01/06/24 Reviewed 02/10/24 Reviewed 03/04/24 Reviewed 04/13/24 Use cane when walking outside, wear phone in pouch around neck, do not walk outside alone. Walk in short increments.        Prevention Ideas                  PRACTICE It is important to practice the strategies so we can determine if they will be effective in helping to reach the goal.    Follow these specific recommendations:        If strategy does not work the first time, try it again.     We may make some changes over the next few sessions.      Stephanie Batty, MSN, RN, BSN [facility], Healthy Communities RN Case Manager for Aging Gracefully Direct Dial: [phone]    02/10/24  Assessment:  Ms .Stephanie Espinoza reports walking outside once a week now. States she uses her cane and has smart watch on her wrist that she can use to call for emergencies. Denies any recent falls. Reports pain is 7 out of 10. Does not like taking pain medication. States she usually takes pain medication at night before bed. Reports having history of side effects and reactions from various medications. States she looks up drug interactions and side effects on line to stay informed. Reports having high blood pressure as of late. Ms. Stephanie Espinoza used her own blood pressure machine to take her own blood pressure while writer was in the home. Her BP was 214/116;  She took her Amlodipine  before writer left the home. About 1.5 later, self reported BP was 199/123. Ms. Karam denied SOB, HA, CP, or dizziness.   Interventions: Writer performed home exercises as Ms. Stephanie Espinoza watched. Advised Ms. Stephanie Espinoza not to demonstrate exercises at the time due to elevated blood pressure. Provided home exercise booklet. Discussed VBCI complex  care management referral for blood pressure management and referral to Pharmacy for medication review interactions. Ms. Stephanie Espinoza agreeable to the referrals. Discussed Clinical research associate sending EMMI videos and literature about HTN and circulatory issues per her request.  Advised Ms. Stephanie Espinoza to go to the ED for ongoing high blood pressure readings. Ms. Stephanie Espinoza declined going to ED. She stated she will consider going but wants to monitor herself for now. Writer sent secure chat to Dr. Nicolette Barrio to make aware of Ms. Stephanie Espinoza's blood pressure readings of 214/116 and 199/123 and that she took her Amlodipine .   Plan: Will make referral to [facility] CCM and will request CCM team to make referral for Pharmacy for medication interactions and review. Scheduled next home visit for May 1st at 12 noon.   Stephanie Batty, MSN, RN, BSN [facility], Healthy Communities RN Case Manager for Aging Gracefully Direct Dial: [phone]     03/04/24  Assessment: Ms. Stephanie Espinoza reports latest blood pressure is 144/106. Denies SOB, chest pain, dizziness, HA, or numbness. Reports taking blood pressure when she is settled. Reports monitoring and being mindful of her salt  intake in foods. Reports taking medications as prescribed. States she usually makes pretty healthy food choices. States her daughter makes sure of it. Reports getting outside doing short walks in the neighborhood. When fatigued, reports pacing herself and taking frequent rest breaks during the day. Ms. Stephanie Espinoza reports she wears her smart watch when walking to call for emergencies and etc.   Interventions: Discussed when to call 911 for high blood pressure and symptoms. Encouraged Ms. Stephanie Espinoza to continue monitoring and documenting blood pressure readings. Encouraged Ms. Stephanie Espinoza to use walking device as needed. Discussed fall precautions.   Plan: Scheduled next home visit on June 10th at 12 noon.   Stephanie Batty, MSN, RN, BSN [facility], Healthy  Communities RN Case Manager for Aging Gracefully Direct Dial: [phone]     04/13/24  Assessment: Ms.Stephanie Espinoza is doing well overall. Ms. Stephanie Espinoza self monitors blood pressure. Ms Stephanie Espinoza reports blood pressure readings for today are 141/87 and 157/92. Reports recent passing of her dear dog Sticky 2 weeks ago. States she misses Sticky but enjoys the company of her cat. Ms. Stephanie Espinoza reports exercising consistently and staying active. Reports looking into aquatic exercise programs. Reports pain 1 out of 10. Reports improvement in neuropathy pain. Denies any falls. States she takes frequent rest breaks during activity. States she has not walked outside as much due to recent rainy days. Continues to have supportive daughter who visits weekly.   Interventions: Offered active listening and support about dog recently passing. Complimented Ms. Stephanie Espinoza for being active, eating healthy, and lower blood pressure readings. Encouraged Ms.Prell to continue to take rest breaks. Discussed fall precautions. Encouraged Ms. Hoganson to have assistive device within reach, if needed. Reminded Ms. Norland of this being writer's last home visit. Discussed OT will return after CHS work is completed.   Plan: Will notify Aging Gracefully team of writer's last home visit.    Stephanie Batty, MSN, RN, BSN [place]  [facility], Healthy Communities RN Case Manager for Aging Gracefully Direct Dial: [phone]                                                                                                             Stephanie Batty, MSN, RN, BSN [place]  [facility], Healthy Communities RN Case Manager for Aging Gracefully Direct Dial: [phone]

## 2024-04-13 NOTE — Patient Instructions (Signed)
 Visit Information  Thank you for taking time to visit with me today. Please don't hesitate to contact me if I can be of assistance to you before our next scheduled home appointment.  Following are the goals we discussed today:   Goals Addressed               This Visit's Progress     COMPLETED: AG RN (pt-stated)        01/06/24  Assessment: Ms. Gertsch reports she will like to get outside more. Reports she will like to walk around the neighborhood when the weather gets warmer. Reports having a cell phone pouch to place around her neck and a smart watch for emergencies when walking outside. 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 Ms. Piech has rollator, walker, and cane for ambulation. Uses her cane most days.  Interventions: Encouraged Ms. Grantham to ask neighbors and/or daughter to walk outside with her. Encouraged Ms. Adler not to walk outside alone. Encouraged Ms. Jordan to call [facility] (closest to her house) to see if they offer senior exercise programs similar to the other centers. Provided calendar book, writer's contact information, and [place] [facility] pamphlet.  Plan: Scheduled next home visit for 02/10/24 at 12 noon.   Nolberto Batty, MSN, RN, BSN [place]  [facility], Healthy Communities RN Case Manager for Aging Gracefully Direct Dial: [phone]     CLIENT/RN ACTION PLAN - GENERIC - (smartphrase AGRNGENERIC)  Registered Nurse:  Nolberto Batty, RN Date: 01/06/2024  Client Name: Baby Lessen Client ID:    Target Area:  Aurther Blue   Why Problem May Occur:  When not feeling well enough or independent enough to go outside    Target Goal:  To go outside and walk (short distances) with someone else, for 1-2 times a week over the next 160 days.    STRATEGIES Coping Strategies: Ideas  Exercise/Activity Reviewed 01/06/24 Reviewed 02/10/24 (exercise booklet) Reviewed 03/04/24 Reviewed 04/13/24 Aging Gracefully exercises, Silver Sneaker  program, senior group exercises at the recreation center, walking outside (short distances)  Involving others with exercises Reviewed 01/06/24 Reviewed 02/10/24 Reviewed 03/04/24 Reviewed 04/13/24 Asking family/neighbors to participate with walks     Safety Reviewed 01/06/24 Reviewed 02/10/24 Reviewed 03/04/24 Reviewed 04/13/24 Use cane when walking outside, wear phone in pouch around neck, do not walk outside alone. Walk in short increments.        Prevention Ideas                  PRACTICE It is important to practice the strategies so we can determine if they will be effective in helping to reach the goal.    Follow these specific recommendations:        If strategy does not work the first time, try it again.     We may make some changes over the next few sessions.      Nolberto Batty, MSN, RN, BSN [facility], Healthy Communities RN Case Manager for Aging Gracefully Direct Dial: [phone]    02/10/24  Assessment:  Ms .Strebel reports walking outside once a week now. States she uses her cane and has smart watch on her wrist that she can use to call for emergencies. Denies any recent falls. Reports pain is 7 out of 10. Does not like taking pain medication. States she usually takes pain medication at night before bed. Reports having history of side effects and reactions from various medications.  States she looks up drug interactions and side effects on line to stay informed. Reports having high blood pressure as of late. Ms. Balzarini used her own blood pressure machine to take her own blood pressure while writer was in the home. Her BP was 214/116;  She took her Amlodipine  before writer left the home. About 1.5 later, self reported BP was 199/123. Ms. Mckowen denied SOB, HA, CP, or dizziness.   Interventions: Writer performed home exercises as [name] watched. Advised Ms. Dueitt not to demonstrate exercises at the time due to elevated blood pressure. Provided home  exercise booklet. Discussed VBCI complex care management referral for blood pressure management and referral to Pharmacy for medication review interactions. Ms. Pyeatt agreeable to the referrals. Discussed Clinical research associate sending EMMI videos and literature about HTN and circulatory issues per her request.  Advised Ms. Crutcher to go to the ED for ongoing high blood pressure readings. Ms. Belmar declined going to ED. She stated she will consider going but wants to monitor herself for now. Writer sent secure chat to Dr. Nicolette Barrio to make aware of Ms. Gullick's blood pressure readings of 214/116 and 199/123 and that she took her Amlodipine .   Plan: Will make referral to [facility] CCM and will request CCM team to make referral for Pharmacy for medication interactions and review. Scheduled next home visit for May 1st at 12 noon.   Nolberto Batty, MSN, RN, BSN [facility], Healthy Communities RN Case Manager for Aging Gracefully Direct Dial: [phone]     03/04/24  Assessment: Ms. Males reports latest blood pressure is 144/106. Denies SOB, chest pain, dizziness, HA, or numbness. Reports taking blood pressure when she is settled. Reports monitoring and being mindful of her salt intake in foods. Reports taking medications as prescribed. States she usually makes pretty healthy food choices. States her daughter makes sure of it. Reports getting outside doing short walks in the neighborhood. When fatigued, reports pacing herself and taking frequent rest breaks during the day. Ms. Herford reports she wears her smart watch when walking to call for emergencies and etc.   Interventions: Discussed when to call 911 for high blood pressure and symptoms. Encouraged Ms. Baize to continue monitoring and documenting blood pressure readings. Encouraged Ms. Massett to use walking device as needed. Discussed fall precautions.   Plan: Scheduled next home visit on June 10th at 12 noon.   Nolberto Batty, MSN, RN, BSN [facility], Healthy Communities RN Case Manager for Aging Gracefully Direct Dial: [phone]     04/13/24  Assessment: Ms.Chea is doing well overall. Ms. Keitt self monitors blood pressure. Ms Gentzler reports blood pressure readings for today are 141/87 and 157/92. Reports recent passing of her dear dog Sticky 2 weeks ago. States she misses Sticky but enjoys the company of her cat. Ms. Braun reports exercising consistently and staying active. Reports looking into aquatic exercise programs. Reports pain 1 out of 10. Reports improvement in neuropathy pain. Denies any falls. States she takes frequent rest breaks during activity. States she has not walked outside as much due to recent rainy days. Continues to have supportive daughter who visits weekly.   Interventions: Offered active listening and support about dog recently passing. Complimented Ms. Maselli for being active, eating healthy, and lower blood pressure readings. Encouraged Ms.Lobo to continue to take rest breaks. Discussed fall precautions. Encouraged Ms. Keeven to have assistive device within reach, if needed. Reminded Ms. Pfahler of this being writer's last home  visit. Discussed OT will return after CHS work is completed.   Plan: Will notify Aging Gracefully team of writer's last home visit.    Nolberto Batty, MSN, RN, BSN [place]  [facility], Healthy Communities RN Case Manager for Aging Gracefully Direct Dial: [phone]                                                                                                                If you are experiencing a Mental Health or Behavioral Health Crisis or need someone to talk to, please call the Suicide and Crisis Lifeline: 988 call the [place]  National Suicide Prevention Lifeline: 770 124 5982 or TTY: 7817562235 TTY 5062561700) to talk to a trained  counselor call 1-800-273-TALK (toll free, 24 hour hotline) call the [facility]: 780-498-0848 call 911   The patient verbalized understanding of instructions, educational materials, and care plan provided today and agreed to receive a mailed copy of patient instructions, educational materials, and care plan.   Nolberto Batty, MSN, RN, BSN [place]  [facility], Healthy Communities RN Case Manager for Aging Gracefully Direct Dial: [phone]

## 2024-04-14 LAB — HIV-1 RNA QUANT-NO REFLEX-BLD
HIV 1 RNA Quant: 18000 {copies}/mL — ABNORMAL HIGH
HIV-1 RNA Quant, Log: 4.26 {Log_copies}/mL — ABNORMAL HIGH

## 2024-04-20 DIAGNOSIS — M6281 Muscle weakness (generalized): Secondary | ICD-10-CM | POA: Diagnosis not present

## 2024-04-20 DIAGNOSIS — R2681 Unsteadiness on feet: Secondary | ICD-10-CM | POA: Diagnosis not present

## 2024-04-20 DIAGNOSIS — R260 Ataxic gait: Secondary | ICD-10-CM | POA: Diagnosis not present

## 2024-04-20 DIAGNOSIS — G63 Polyneuropathy in diseases classified elsewhere: Secondary | ICD-10-CM | POA: Diagnosis not present

## 2024-04-22 DIAGNOSIS — M6281 Muscle weakness (generalized): Secondary | ICD-10-CM | POA: Diagnosis not present

## 2024-04-22 DIAGNOSIS — R2681 Unsteadiness on feet: Secondary | ICD-10-CM | POA: Diagnosis not present

## 2024-04-22 DIAGNOSIS — R260 Ataxic gait: Secondary | ICD-10-CM | POA: Diagnosis not present

## 2024-04-22 DIAGNOSIS — G63 Polyneuropathy in diseases classified elsewhere: Secondary | ICD-10-CM | POA: Diagnosis not present

## 2024-04-23 ENCOUNTER — Other Ambulatory Visit: Payer: Self-pay | Admitting: Rehabilitation

## 2024-04-24 NOTE — Patient Outreach (Signed)
 Aging Gracefully Program  OT Follow-Up Visit  04/24/2024  Stephanie Espinoza Mar 09, 1956 [id number]  Visit:  3- Third Visit  Start Time:  1330 End Time:  1400 Total Minutes:  30  Readiness to Change Score :  Readiness to Change Score: 10   Durable Medical Equipment: Durable Medical Equipment: Other (Grab bars in place) Durable Medical Equipment Distribution Date: 04/23/24  Patient Education: Education Provided: Yes Education Details: Energy conservation Education administrator) Educated: Patient Comprehension: Verbalized Understanding, Returned Demonstration  Goals:   Goals Addressed             This Visit's Progress    COMPLETED: Patient Stated       Improve safety and mobility in bathrooms  04/23/24: using new grab bars in both bathrooms.   OT ACTION PLAN: Functional Mobility  Target Problem Area:  Safety stepping in and out of shower/bath   Why Problem May Occur:     neuropathy  Balance and strength of legs      Target Goal(s):   Improve safety and mobility in the bathroom    STRATEGIES   Saving Your Energy DO:  Take breaks  Raise the height of surfaces   Remove tripping hazards  (Other): Use of reacher to avoid straining to pick up items   (Other):   (Other):     Modifying your home environment and making it safe    DO:  Use grab bars in the bathroom  Have supplies in one area  (Other):      Simplifying the way you set up tasks or daily routines DO:  Move slowly, take your time   (Other):have materials ready and available   (Other): have a seat available to rest as needed      PRACTICE  Based on what we have talked about, you are willing to try:  _______Continue to use Energy Conservation________  __________Use grab bars with all transitions. ____  If a strategy does not work the first time, try it again (and again).          Patient Stated   On track    Improve safety and access to outside (deck and side of house) 04/23/24:  goal is in progress     COMPLETED: Patient Stated       Improve reaching safety 01/31/24  OT Action Plan  Target Problem Area:  Reducing need to bend over to pick up from the floor   Why Problem May Occur:     Neuropathy of feet  Loss of balance  Loss/impaired body awareness         Target Goal(s):   Practice using reacher to reduce bending over to pick up items from the floor   STRATEGIES   Saving your Energy DO:  Plan ahead  Take breaks before you get tired   Monitor your response to exercise/movement and LE cramps              Change your home to make it safe for you    DO:  Already wearing comfortable shoes  Try using the reacher to see how it can assist you   Awaiting modifications to add grab bars for improved safety           PRACTICE  Based on what we have talked about, you are willing to try:  __Using the reacher to pick up items from the floor  ________________________________________________________________  If an idea does not work the first time, try it again.  We may  make some changes over the next few sessions, based on how they work.  04/23/24: ease with use of extender for light over bed in room. Using reacher and has stored in consistent location.            Post Clinical Reasoning: Client Action (Goal) One Interventions: 04/23/24: safety and mobility in the bathroom: now able to use grab bar to step in and out of the shower, can also use for sit to stand from toilet. Can exit the tub independently and safely using the new grab bar on the wall. Did Client Try?: Yes Targeted Problem Area Status: A Lot Better Client Action (Goal) Two Interventions: Improve safety and access to outside: CHS is addressing back deck-and rail to garage is in progress. Will address final visit Did Client Try?: No Client Action (Goal) Three Interventions: Improving reaching safety picking up items an dover head light access. Now has extender from  light in bedroom, easy reach. Using reacher and keeps in the kitchen so it can be easily located. Did Client Try?: Yes Targeted Problem Area Status: A Lot Better Clinician View Of Client Situation:: Client is doing well. She started PT with access to water  therapy to help with strength and balance. Moving well and sleeping better. Client using strategies and responsive to Energy Environmental education officer Of His/Her Situation:: Reports enjoying PT with access to water . Rpeorts improvements and strategies in place. Can more easily step out of the tub with the grab bar in place. Next Visit Plan:: OT will return for final visit after all construction is complete.

## 2024-04-27 DIAGNOSIS — R260 Ataxic gait: Secondary | ICD-10-CM | POA: Diagnosis not present

## 2024-04-27 DIAGNOSIS — G63 Polyneuropathy in diseases classified elsewhere: Secondary | ICD-10-CM | POA: Diagnosis not present

## 2024-04-27 DIAGNOSIS — M6281 Muscle weakness (generalized): Secondary | ICD-10-CM | POA: Diagnosis not present

## 2024-04-27 DIAGNOSIS — R2681 Unsteadiness on feet: Secondary | ICD-10-CM | POA: Diagnosis not present

## 2024-04-29 ENCOUNTER — Other Ambulatory Visit: Payer: Self-pay

## 2024-04-29 NOTE — Patient Instructions (Addendum)
 Visit Information  Thank you for taking time to visit with me today. Please don't hesitate to contact me if I can be of assistance to you before our next scheduled appointment.  Our next appointment is by telephone on 05/20/24 at 1:00 pm Please call the care guide team at [phone] if you need to cancel or reschedule your appointment.   Following is a copy of your care plan:   Goals Addressed             This Visit's Progress    VBCI RN Care Plan       Problems:  Chronic Disease Management support and education needs related to HTN No Advanced Directives in place-patient reports she has received AD packet.  Goal: Over the next 90 days the Patient will collaborate with the care management team towards completion of advanced directives and report receipt of packet as evidenced by patient report  take all medications exactly as prescribed and will call provider for medication related questions as evidenced by patient report or review of chart    verbalize understanding of plan for management of HTN as evidenced by patient report and review of chart  Interventions:   Hypertension Interventions: Last practice recorded BP readings:  BP Readings from Last 3 Encounters:  04/29/24 (!) 147/98  04/13/24 (!) 141/87  03/30/24 (!) 151/105   Most recent eGFR/CrCl:  Lab Results  Component Value Date   EGFR 49 (L) 01/05/2024    No components found for: CRCL  Evaluation of current treatment plan related to hypertension self management and patient's adherence to plan as established by provider Reviewed medications with patient and discussed importance of compliance Reviewed scheduled/upcoming provider appointments including:  Discussed complications of poorly controlled blood pressure such as heart disease, stroke, circulatory complications, vision complications, kidney impairment, sexual dysfunction Screening for signs and symptoms of depression related to chronic disease state   Advised patient to contact Primary care provider and schedule a follow up appointment regarding BP RNCM reviewed recommendations per clinical pharmacist telephone visit on 03/26/24. RNCM advised patient to follow up with primary care re: BP readings as recommended by clinical pharmacist. Target BP <130/80 and contact provider if BP consistently >140/90. Reiterated hypertensive crisis and when to seek medical attention-Do not wait to see whether your blood pressure comes down on its own. Call 911. per american heart association: Call 911 if your blood pressure is 180/120 or higher and you are experiencing symptoms that may include: Chest pain Shortness of breath Back pain Numbness Weakness Change in vision Difficulty speaking Advised to discuss with primary care provider for recommended Target range. Advised patient to take BP monitor to next provider office and request check for accuracy against a manual reading. Confirmed patient received AD packet sent via mail. Encouraged to contact RNCM and or provider if additional questions. Patient to take to provider once completed. Advised patient to schedule appointment with PCP as she may need labs and to discuss concerns/medications/questions with provider.  Patient Self-Care Activities:  take blood pressure log to all doctor appointments call doctor for signs and symptoms of high blood pressure keep all doctor appointments take medications for blood pressure exactly as prescribed report new symptoms to your doctor Continue to follow a low salt diet Per American Heart Association Recommendations: Call 911 if your blood pressure is 180/120 or higher; Or if you are experiencing symptoms that may include: chest pain; shortness of breath, back pain, numbness, weakness, change in vision or difficulty speaking. Continue exercise  plan- aquatherapy. Consistency is key! Discuss with provider questions/medications/concerns regarding weight gain and weight  loss measures-discuss possible weight management clinic referral with provider. .   Plan:  Telephone follow up appointment with care management team member scheduled for:  05/20/24 at 1:00 pm           Please call the Suicide and Crisis Lifeline: 988 call the [place]  National Suicide Prevention Lifeline: 918-158-0873 or TTY: 734-877-0163 TTY 302-854-4505) to talk to a trained counselor if you are experiencing a Mental Health or Behavioral Health Crisis or need someone to talk to.  Patient verbalizes understanding of instructions and care plan provided today and agrees to view in MyChart. Active MyChart status and patient understanding of how to access instructions and care plan via MyChart confirmed with patient.     Heddy Shutter, RN, MSN, BSN, CCM [place]  [facility], Population Health Case Manager Phone: [phone]

## 2024-04-29 NOTE — Patient Outreach (Signed)
 Complex Care Management   Visit Note  04/29/2024  Name:  Stephanie Espinoza MRN: [id number] DOB: [birth year]  Situation: Referral received for Complex Care Management related to HTN I obtained verbal consent from Patient.  Visit completed with patient  on the phone  Background:   Past Medical History:  Diagnosis Date   Abnormal Pap smear of cervix before 1995   always a repeat back to normal except for 1 time and had colpo bioosy -resutls were normal   Allergic rhinitis    Allergy    Anxiety    Bell's palsy after 1995 ?   no residual SE.   Depression    Genital herpes    History of drug abuse (HCC)    drug usser 1984 - 1990.  cocaine   HIV (human immunodeficiency virus infection) (HCC) 1990   from sexual contact who is unknown   Hypertension    Syphilis    treated    Assessment: Patient reports her BP is improved- today BP 148/98 pulse 86. She reports primary concern is weight gain that began when she started the medication Biktarvy . She reports metabolism has slowed down. Patient reports today she is not taking any medications daily except multi-Vitamin and Vitamin D . She states she stopped spironolactone  3-4 weeks ago. She states, "it has not helped". She is taking Bactrim  every other day(prescribed daily). Per patient, she has been taking for years and questions efficacy with the long term use. She reports has started exercising-aqua therapy twice a week (started 2 weeks ago). She continues to check BP and weights daily.  Patient Reported Symptoms:  Cognitive Cognitive Status: Alert and oriented to person, place, and time, Insightful and able to interpret abstract concepts, Normal speech and language skills      Neurological Neurological Review of Symptoms: No symptoms reported    HEENT HEENT Symptoms Reported: No symptoms reported      Cardiovascular Does patient have uncontrolled Hypertension?: Yes Is patient checking Blood Pressure at home?: Yes Weight: 183 lb (83 kg) (home  reading)  Respiratory Respiratory Symptoms Reported: No symptoms reported    Endocrine Patient reports the following symptoms related to hypoglycemia or hyperglycemia : No symptoms reported Is patient diabetic?: No    Gastrointestinal Gastrointestinal Symptoms Reported: No symptoms reported      Genitourinary Genitourinary Symptoms Reported: No symptoms reported    Integumentary Integumentary Symptoms Reported: No symptoms reported Additional Integumentary Details: reports bruises easily and states, that is normal    Musculoskeletal Musculoskelatal Symptoms Reviewed: Difficulty walking Other Musculoskeletal Symptoms: neuropathy use walker or cane as needed        Psychosocial Psychosocial Symptoms Reported: No symptoms reported     Quality of Family Relationships: supportive      03/11/2024    2:22 PM  Depression screen PHQ 2/9  Decreased Interest 0  Down, Depressed, Hopeless 0  PHQ - 2 Score 0  Altered sleeping 0  Tired, decreased energy 0  Change in appetite 0  Feeling bad or failure about yourself  0  Trouble concentrating 0  Moving slowly or fidgety/restless 0  Suicidal thoughts 0  PHQ-9 Score 0  Difficult doing work/chores Not difficult at all    Vitals:   04/29/24 1328  BP: (!) 147/98  Pulse: 86    Medications Reviewed Today     Reviewed by Averill Winters M, RN (Registered Nurse) on 04/29/24 at 1319  Med List Status: <None>   Medication Order Taking? Sig Documenting Provider Last Dose Status Informant  acyclovir  ointment (ZOVIRAX ) 5 % [id number]  APPLY TOPICALLY EVERY 3 (THREE) HOURS. USE AS NEEDED FOR SYMPTOMS RELIEF FOR NO MORE THAN 7 DAYS.  Patient not taking: Reported on 04/29/2024   Tad Arland POUR, CNM  Active   albuterol  (VENTOLIN  HFA) 108 (90 Base) MCG/ACT inhaler [id number]  INHALE 1-2 PUFFS INTO THE LUNGS EVERY 4 HOURS AS NEEDED FOR WHEEZING OR SHORTNESS OF BREATH.  Patient not taking: Reported on 04/29/2024   Rollene Almarie LABOR, MD  Active    amitriptyline  (ELAVIL ) 10 MG tablet [id number] Yes Take 10 mg by mouth at bedtime. [provider]  Active            Med Note KARLYNN, HEDDY M   Fri Feb 27, 2024 10:07 AM) as needed  bictegravir-emtricitabine -tenofovir  AF (BIKTARVY ) 50-200-25 MG TABS tablet [id number]  Take 1 tablet by mouth daily.  Patient not taking: Reported on 03/11/2024   Luiz Channel, MD  Active   cholecalciferol (VITAMIN D3) 25 MCG (1000 UNIT) tablet [id number] Yes Take 1,000 Units by mouth daily. gummie [provider]  Active   cyclobenzaprine  (FLEXERIL ) 5 MG tablet [id number]  TAKE 1 TABLET BY MOUTH THREE TIMES A DAY AS NEEDED FOR MUSCLE SPASM  Patient not taking: Reported on 04/29/2024   Rollene Almarie LABOR, MD  Active   dolutegravir -rilpivirine (JULUCA ) 50-25 MG tablet [id number]  Take 1 tablet by mouth daily before lunch.  Patient not taking: Reported on 03/11/2024   Luiz Channel, MD  Active   EPINEPHrine  (EPIPEN  2-PAK) 0.3 mg/0.3 mL IJ SOAJ injection [id number] Yes Inject 0.3 mg into the muscle as needed for anaphylaxis. Joshua Debby CROME, MD  Active   fluticasone  (FLONASE ) 50 MCG/ACT nasal spray [id number]  Place 2 sprays into both nostrils daily.  Patient not taking: Reported on 04/29/2024   Rollene Almarie LABOR, MD  Active            Med Note KARLYNN, Daxtin Leiker M   Fri Feb 27, 2024 10:09 AM) As needed  gabapentin  (NEURONTIN ) 100 MG capsule [id number]  Take 100 mg by mouth at bedtime.  Patient not taking: Reported on 04/29/2024   [provider]  Active            Med Note ALYNE, [facility] S   Tue Jul 08, 2023  8:21 AM) As needed  lidocaine  (LIDODERM ) 5 % [id number]  Place 1 patch onto the skin daily. Remove & Discard patch within 12 hours or as directed by MD Rollene Almarie LABOR, MD  Active            Med Note KARLYNN, Kedrick Mcnamee M   Fri Feb 27, 2024 10:10 AM) As needed  Multiple Vitamin (MULTIVITAMIN) tablet [id number] Yes Take 2 tablets by mouth daily. gummies [provider]   Active   spironolactone  (ALDACTONE ) 25 MG tablet [id number]  Take 1 tablet (25 mg total) by mouth daily.  Patient not taking: Reported on 04/29/2024   Rollene Almarie LABOR, MD  Active   sulfamethoxazole -trimethoprim  (BACTRIM  DS) 800-160 MG tablet [id number] Yes TAKE 1 TABLET BY MOUTH EVERY DAY  Patient taking differently: TAKE 1 TABLET BY MOUTH EVERY DAY   Luiz Channel, MD  Active   traMADol (ULTRAM) 50 MG tablet [id number] Yes Take 50 mg by mouth every 4 (four) hours as needed. [provider]  Active   valACYclovir  (VALTREX ) 500 MG tablet [id number] Yes Take 500 mg by mouth 2 (two) times daily. TAKES AS PRN  Patient taking differently: Take 500 mg by mouth 2 (  two) times daily. TAKES AS PRN   [provider]  Active           Recommendation:   PCP Follow-up  Follow Up Plan:   Telephone follow up appointment date/time:  05/20/24 at 1:00 pm  Heddy Shutter, RN, MSN, BSN, CCM   [facility], Population Health Case Manager Phone: [phone]

## 2024-05-10 ENCOUNTER — Ambulatory Visit (INDEPENDENT_AMBULATORY_CARE_PROVIDER_SITE_OTHER): Admitting: Internal Medicine

## 2024-05-10 ENCOUNTER — Ambulatory Visit: Payer: Self-pay | Admitting: Internal Medicine

## 2024-05-10 ENCOUNTER — Encounter: Payer: Self-pay | Admitting: Internal Medicine

## 2024-05-10 VITALS — BP 176/103 | HR 76 | Temp 98.4°F | Ht 62.0 in | Wt 182.0 lb

## 2024-05-10 DIAGNOSIS — I10 Essential (primary) hypertension: Secondary | ICD-10-CM | POA: Diagnosis not present

## 2024-05-10 DIAGNOSIS — B20 Human immunodeficiency virus [HIV] disease: Secondary | ICD-10-CM | POA: Diagnosis not present

## 2024-05-10 DIAGNOSIS — R739 Hyperglycemia, unspecified: Secondary | ICD-10-CM

## 2024-05-10 DIAGNOSIS — Z1211 Encounter for screening for malignant neoplasm of colon: Secondary | ICD-10-CM

## 2024-05-10 LAB — COMPREHENSIVE METABOLIC PANEL WITH GFR
ALT: 16 U/L (ref 0–35)
AST: 21 U/L (ref 0–37)
Albumin: 4 g/dL (ref 3.5–5.2)
Alkaline Phosphatase: 96 U/L (ref 39–117)
BUN: 16 mg/dL (ref 6–23)
CO2: 27 meq/L (ref 19–32)
Calcium: 9.5 mg/dL (ref 8.4–10.5)
Chloride: 105 meq/L (ref 96–112)
Creatinine, Ser: 1.25 mg/dL — ABNORMAL HIGH (ref 0.40–1.20)
GFR: 45.18 mL/min — ABNORMAL LOW (ref >60.00)
Glucose, Bld: 98 mg/dL (ref 70–99)
Potassium: 4.2 meq/L (ref 3.5–5.1)
Sodium: 139 meq/L (ref 135–145)
Total Bilirubin: 0.7 mg/dL (ref 0.2–1.2)
Total Protein: 7.9 g/dL (ref 6.0–8.3)

## 2024-05-10 LAB — CBC
HCT: 36.8 % (ref 36.0–46.0)
Hemoglobin: 11.8 g/dL — ABNORMAL LOW (ref 12.0–15.0)
MCHC: 32.2 g/dL (ref 30.0–36.0)
MCV: 85.9 fl (ref 78.0–100.0)
Platelets: 186 K/uL (ref 150.0–400.0)
RBC: 4.28 Mil/uL (ref 3.87–5.11)
RDW: 13.6 % (ref 11.5–15.5)
WBC: 4 K/uL (ref 4.0–10.5)

## 2024-05-10 LAB — HEMOGLOBIN A1C: Hgb A1c MFr Bld: 5.9 % (ref 4.6–6.5)

## 2024-05-10 LAB — LIPID PANEL
Cholesterol: 170 mg/dL (ref 0–200)
HDL: 37.9 mg/dL — ABNORMAL LOW (ref >39.00)
LDL Cholesterol: 98 mg/dL (ref 0–99)
NonHDL: 132.36
Total CHOL/HDL Ratio: 4
Triglycerides: 171 mg/dL — ABNORMAL HIGH (ref 0.0–149.0)
VLDL: 34.2 mg/dL (ref 0.0–40.0)

## 2024-05-10 NOTE — Patient Instructions (Signed)
 We will check the labs today.

## 2024-05-10 NOTE — Progress Notes (Unsigned)
   Subjective:   Patient ID: Stephanie Espinoza, female    DOB: Dec 03, 1955, 68 y.o.   MRN: [id number]  HPI The patient is a 68 YO female coming in for follow up BP. BP is improving at home more consistently. She is taking the spironolactone .   Review of Systems  Constitutional: Negative.   HENT: Negative.    Eyes: Negative.   Respiratory:  Negative for cough, chest tightness and shortness of breath.   Cardiovascular:  Negative for chest pain, palpitations and leg swelling.  Gastrointestinal:  Negative for abdominal distention, abdominal pain, constipation, diarrhea, nausea and vomiting.  Musculoskeletal: Negative.   Skin: Negative.   Neurological: Negative.   Psychiatric/Behavioral: Negative.      Objective:  Physical Exam Constitutional:      Appearance: She is well-developed.  HENT:     Head: Normocephalic and atraumatic.  Cardiovascular:     Rate and Rhythm: Normal rate and regular rhythm.  Pulmonary:     Effort: Pulmonary effort is normal. No respiratory distress.     Breath sounds: Normal breath sounds. No wheezing or rales.  Abdominal:     General: Bowel sounds are normal. There is no distension.     Palpations: Abdomen is soft.     Tenderness: There is no abdominal tenderness. There is no rebound.  Musculoskeletal:     Cervical back: Normal range of motion.  Skin:    General: Skin is warm and dry.  Neurological:     Mental Status: She is alert and oriented to person, place, and time.     Coordination: Coordination normal.     Vitals:   05/10/24 0928 05/10/24 0932  BP: (!) 160/84 (!) 176/103  Pulse: 76   Temp: 98.4 F (36.9 C)   TempSrc: Oral   SpO2: 98%   Weight: 182 lb (82.6 kg)   Height: 5' 2 (1.575 m)     Assessment & Plan:

## 2024-05-12 NOTE — Assessment & Plan Note (Signed)
 Checking HGA1c for follow up.

## 2024-05-12 NOTE — Assessment & Plan Note (Signed)
 Needs yearly lipid monitoring and checking today. Stable counts and seeing ID. Is not currently on HAART due to side effects.

## 2024-05-12 NOTE — Assessment & Plan Note (Signed)
 She is taking spironolactone  25 mg daily and BP is improving but not consistently at goal. Checking CMP and CBC. Adjust as needed we can consider increased dose to 50 mg if needed. We decided to wait 2-3 weeks and increase if not improved by then.

## 2024-05-14 ENCOUNTER — Other Ambulatory Visit: Payer: Self-pay | Admitting: Rehabilitation

## 2024-05-15 NOTE — Patient Outreach (Signed)
 Aging Gracefully Program  OT FINAL Visit  05/15/2024  Stephanie Espinoza 1956-01-15 [id number]  Visit:  4- Fourth Visit  Start Time:  1400 End Time:  1420 Total Minutes:  20  Readiness to Change:  Readiness to Change Score: 10     Durable Medical Equipment: Adaptive Equipment: Reacher Adaptive Equipment Distribution Date: 01/30/24  Patient Education: Education Provided: Yes Education Details: TIPS booklet Person(s) Educated: Patient Comprehension: Verbalized Understanding, Returned Demonstration  Goals:  Goals Addressed             This Visit's Progress    COMPLETED: Patient Stated       Improve safety and mobility in bathrooms  04/23/24: using new grab bars in both bathrooms.   OT ACTION PLAN: Functional Mobility  Target Problem Area:  Safety stepping in and out of shower/bath   Why Problem May Occur:     neuropathy  Balance and strength of legs      Target Goal(s):   Improve safety and mobility in the bathroom    STRATEGIES   Saving Your Energy DO:  Take breaks  Raise the height of surfaces   Remove tripping hazards  (Other): Use of reacher to avoid straining to pick up items   (Other):   (Other):     Modifying your home environment and making it safe    DO:  Use grab bars in the bathroom  Have supplies in one area  (Other):      Simplifying the way you set up tasks or daily routines DO:  Move slowly, take your time   (Other):have materials ready and available   (Other): have a seat available to rest as needed      PRACTICE  Based on what we have talked about, you are willing to try:  _______Continue to use Energy Conservation________  __________Use grab bars with all transitions. ____  If a strategy does not work the first time, try it again (and again).  05/14/24: goal is complete. Client using grab bars with success.        COMPLETED: Patient Stated       Improve safety and access to outside (deck and side of  house) 04/23/24: goal is in progress 05/14/24: New deck installed and client is able to step down one step to the deck and back up to the house. Independent mobility on the deck.     COMPLETED: Patient Stated       Improve reaching safety 01/31/24  OT Action Plan  Target Problem Area:  Reducing need to bend over to pick up from the floor   Why Problem May Occur:     Neuropathy of feet  Loss of balance  Loss/impaired body awareness         Target Goal(s):   Practice using reacher to reduce bending over to pick up items from the floor   STRATEGIES   Saving your Energy DO:  Plan ahead  Take breaks before you get tired   Monitor your response to exercise/movement and LE cramps              Change your home to make it safe for you    DO:  Already wearing comfortable shoes  Try using the reacher to see how it can assist you   Awaiting modifications to add grab bars for improved safety           PRACTICE  Based on what we have talked about, you are willing to try:  __Using the reacher to pick up items from the floor  ________________________________________________________________  If an idea does not work the first time, try it again.  We may make some changes over the next few sessions, based on how they work.  04/23/24: ease with use of extender for light over bed in room. Using reacher and has stored in consistent location.  05/14/24: goal met           Post Clinical Reasoning: Client Action (Goal) One Interventions: 05/14/24: client is able to access safer mobility inside both bathrooms using grab bars Did Client Try?: Yes Targeted Problem Area Status: A Lot Better Client Action (Goal) Two Interventions: 05/14/24: new decking allow client to safely access outdoors. Can safely step down one step to deck and step back into home. Did Client Try?: Yes Targeted Problem Area Status: A Lot Better Client Action (Goal) Three Interventions: 05/14/24:  able to use light extender off the ceiling fan and uses reacher to pick up items as needed. Did Client Try?: Yes Targeted Problem Area Status: A Lot Better Clinician View Of Client Situation:: Client seems happy about improved access to the outdoors with her new deck. Able to demonstrate all areas of improvement in the home to the OT. Did not add hand rail to garage and is able to safely step up and down. Client View Of His/Her Situation:: Client is able to identify all modifications. Notes she is stronger and better managing her health and neuropathy using strategies.  Client reports she is satisfied with home modifications Next Visit Plan:: Final visit completed today

## 2024-05-18 ENCOUNTER — Other Ambulatory Visit: Payer: Self-pay

## 2024-05-18 ENCOUNTER — Ambulatory Visit: Admitting: Internal Medicine

## 2024-05-18 ENCOUNTER — Encounter: Payer: Self-pay | Admitting: Internal Medicine

## 2024-05-18 VITALS — BP 123/82 | HR 78 | Temp 97.4°F | Ht 62.0 in | Wt 182.0 lb

## 2024-05-18 DIAGNOSIS — B20 Human immunodeficiency virus [HIV] disease: Secondary | ICD-10-CM

## 2024-05-18 MED ORDER — DELSTRIGO 100-300-300 MG PO TABS: 1.0000 | ORAL_TABLET | Freq: Every day | ORAL | 11 refills | Status: AC

## 2024-05-18 MED ORDER — TIVICAY 50 MG PO TABS: 50.0000 mg | ORAL_TABLET | Freq: Every day | ORAL | 11 refills | Status: DC

## 2024-05-18 NOTE — Progress Notes (Signed)
 Patient ID: Stephanie Espinoza, female   DOB: August 29, 1956, 68 y.o.   MRN: [id number]  HPI 68yo M with HIV disease, CD 4 count of 131(10%)/VL 18,000 in June 2025, the patient stopped juluca  due to lip swelling and rash but also had been on ace-inhibitor. Patient reports intolerances to many hiv medications, what she recalls mostly from  BIKTARVY (biktarvy , taf, entracibine) significant weight gain but no lip swelling JULUCA  (DLT, RPV) = lip swelling -- She is looking at side effects of a few ART meds, that she is willing to try:  Stribild (elvitragrivir, cobi, tdf, entracitabine)-   Delstrigo  (doravarine, tdf, lamivudine)-- Outpatient Encounter Medications as of 05/18/2024  Medication Sig   acyclovir  ointment (ZOVIRAX ) 5 % APPLY TOPICALLY EVERY 3 (THREE) HOURS. USE AS NEEDED FOR SYMPTOMS RELIEF FOR NO MORE THAN 7 DAYS.   albuterol  (VENTOLIN  HFA) 108 (90 Base) MCG/ACT inhaler INHALE 1-2 PUFFS INTO THE LUNGS EVERY 4 HOURS AS NEEDED FOR WHEEZING OR SHORTNESS OF BREATH.   amitriptyline  (ELAVIL ) 10 MG tablet Take 10 mg by mouth at bedtime.   cholecalciferol (VITAMIN D3) 25 MCG (1000 UNIT) tablet Take 1,000 Units by mouth daily. gummie   cyclobenzaprine  (FLEXERIL ) 5 MG tablet TAKE 1 TABLET BY MOUTH THREE TIMES A DAY AS NEEDED FOR MUSCLE SPASM   EPINEPHrine  (EPIPEN  2-PAK) 0.3 mg/0.3 mL IJ SOAJ injection Inject 0.3 mg into the muscle as needed for anaphylaxis.   fluticasone  (FLONASE ) 50 MCG/ACT nasal spray Place 2 sprays into both nostrils daily.   gabapentin  (NEURONTIN ) 100 MG capsule Take 100 mg by mouth at bedtime.   lidocaine  (LIDODERM ) 5 % Place 1 patch onto the skin daily. Remove & Discard patch within 12 hours or as directed by MD   Multiple Vitamin (MULTIVITAMIN) tablet Take 2 tablets by mouth daily. gummies   spironolactone  (ALDACTONE ) 25 MG tablet Take 1 tablet (25 mg total) by mouth daily.   sulfamethoxazole -trimethoprim  (BACTRIM  DS) 800-160 MG tablet TAKE 1 TABLET BY MOUTH EVERY DAY    traMADol (ULTRAM) 50 MG tablet Take 50 mg by mouth every 4 (four) hours as needed.   valACYclovir  (VALTREX ) 500 MG tablet Take 500 mg by mouth 2 (two) times daily. TAKES AS PRN   No facility-administered encounter medications on file as of 05/18/2024.     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   Right foot pain 05/22/2022   Right lumbar radiculopathy 05/08/2022   DDD (degenerative disc disease), lumbar 02/12/2022   Anterolisthesis of lumbosacral spine 02/12/2022   Weakness of both legs 03/07/2021   B12 deficiency 03/07/2021   Urinary incontinence 05/16/2020   Other fatigue 05/16/2020   History of syphilis 05/14/2020   Memory loss 02/14/2020   Insomnia 02/14/2020   Herpes zoster 12/31/2019   Normocytic anemia 03/01/2019   Hyperglycemia 03/01/2019   Headache 06/30/2018   Urinary frequency 05/01/2018   Cervical 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 (HPV) DNA test positive 10/18/2016   Essential hypertension 09/16/2014   Vasomotor rhinitis 05/05/2014   Benign paroxysmal positional vertigo 09/21/2012   Sinusitis 03/22/2009   Human immunodeficiency virus (HIV) disease (HCC) 09/06/2006   Genital herpes 09/06/2006   Depression 09/06/2006     Health Maintenance Due  Topic Date Due   Hepatitis B Vaccines (3 of 3 - Risk 3-dose series) 02/22/2003  COVID-19 Vaccine (5 - 2024-25 season) 07/06/2023   MAMMOGRAM  08/03/2023     Review of Systems Review of Systems  Constitutional: Negative for fever, chills, diaphoresis, activity change, appetite change, fatigue and unexpected weight change.  HENT: Negative for congestion, sore throat, rhinorrhea, sneezing, trouble swallowing and sinus pressure.  Eyes: Negative for photophobia and visual disturbance.  Respiratory: Negative for cough, chest tightness,  shortness of breath, wheezing and stridor.  Cardiovascular: Negative for chest pain, palpitations and leg swelling.  Gastrointestinal: Negative for nausea, vomiting, abdominal pain, diarrhea, constipation, blood in stool, abdominal distention and anal bleeding.  Genitourinary: Negative for dysuria, hematuria, flank pain and difficulty urinating.  Musculoskeletal: Negative for myalgias, back pain, joint swelling, arthralgias and gait problem.  Skin: Negative for color change, pallor, rash and wound.  Neurological: Negative for dizziness, tremors, weakness and light-headedness.  Hematological: Negative for adenopathy. Does not bruise/bleed easily.  Psychiatric/Behavioral: Negative for behavioral problems, confusion, sleep disturbance, dysphoric mood, decreased concentration and agitation.   Physical Exam   BP 123/82   Pulse 78   Temp (!) 97.4 F (36.3 C) (Temporal)   Ht 5' 2 (1.575 m)   Wt 182 lb (82.6 kg)   LMP 11/04/1997   SpO2 96%   BMI 33.29 kg/m   Physical Exam  Constitutional:  oriented to person, place, and time. appears well-developed and well-nourished. No distress.  HENT: [place]/AT, PERRLA, no scleral icterus+geographic tongue Mouth/Throat: Oropharynx is clear and moist. No oropharyngeal exudate.  Cardiovascular: Normal rate, regular rhythm and normal heart sounds. Exam reveals no gallop and no friction rub.  No murmur heard.  Pulmonary/Chest: Effort normal and breath sounds normal. No respiratory distress.  has no wheezes.  Neck = supple, no nuchal rigidity Abdominal: Soft. Bowel sounds are normal.  exhibits no distension. There is no tenderness.  Lymphadenopathy: no cervical adenopathy. No axillary adenopathy Neurological: alert and oriented to person, place, and time.  Skin: Skin is warm and dry. No rash noted. No erythema.  Psychiatric: a normal mood and affect.  behavior is normal.   Lab Results  Component Value Date   CD4TCELL 10 (L) 04/12/2024   Lab Results   Component Value Date   CD4TABS 131 (L) 04/12/2024   CD4TABS 129 (L) 01/05/2024   CD4TABS 111 (L) 10/06/2023   Lab Results  Component Value Date   HIV1RNAQUANT 18,000 (H) 04/12/2024   Lab Results  Component Value Date   HEPBSAB No 12/29/2006   Lab Results  Component Value Date   LABRPR NON-REACTIVE 01/05/2024    CBC Lab Results  Component Value Date   WBC 4.0 05/10/2024   RBC 4.28 05/10/2024   HGB 11.8 (L) 05/10/2024   HCT 36.8 05/10/2024   PLT 186.0 05/10/2024   MCV 85.9 05/10/2024   MCH 29.2 01/05/2024   MCHC 32.2 05/10/2024   RDW 13.6 05/10/2024   LYMPHSABS 1,389 08/04/2023   MONOABS 0.2 05/11/2022   EOSABS 93 01/05/2024    BMET Lab Results  Component Value Date   NA 139 05/10/2024   K 4.2 05/10/2024   CL 105 05/10/2024   CO2 27 05/10/2024   GLUCOSE 98 05/10/2024   BUN 16 05/10/2024   CREATININE 1.25 (H) 05/10/2024   CALCIUM  9.5 05/10/2024   GFRNONAA >60 05/11/2022   GFRAA >60 03/03/2019      Assessment and Plan Hiv disease= poorly controlled not on ART at the moment due to side effects. Will check HIV VL with genotype. Will start delstrigo  for 1 week then add  tivicay  Bactrim  m-w-f -- oi proph  Spent time to discuss importance to getting her back on treatment and important of adherence. See back in 2-3 wk to see how she is doing with medication.  I have personally spent 35 minutes involved in face-to-face and non-face-to-face activities for this patient on the day of the visit. Professional time spent includes the following activities: Preparing to see the patient (review of tests), Performing a medically appropriate examination and/or evaluation , Ordering medications/tests/procedures, referring and communicating with other health care professionals, Documenting clinical information in the EMR, Independently interpreting results (not separately reported), Communicating results to the patient

## 2024-05-19 ENCOUNTER — Encounter: Payer: Self-pay | Admitting: Internal Medicine

## 2024-05-20 ENCOUNTER — Encounter: Payer: Self-pay | Admitting: Internal Medicine

## 2024-05-20 ENCOUNTER — Other Ambulatory Visit: Payer: Self-pay

## 2024-05-20 ENCOUNTER — Ambulatory Visit: Admitting: Internal Medicine

## 2024-05-20 NOTE — Patient Outreach (Signed)
 Complex Care Management   Visit Note  05/20/2024  Name:  Stephanie Espinoza MRN: [id number] DOB: 1955/12/11  Situation: Referral received for Complex Care Management related to hypertension I obtained verbal consent from Patient.  Visit completed with patient   on the phone  Background:   Past Medical History:  Diagnosis Date   Abnormal Pap smear of cervix before 1995   always a repeat back to normal except for 1 time and had colpo bioosy -resutls were normal   Allergic rhinitis    Allergy    Anxiety    Arthritis    Asthma    Bell's palsy after 1995 ?   no residual SE.   Depression    Genital herpes    History of drug abuse (HCC)    drug usser 1984 - 1990.  cocaine   HIV (human immunodeficiency virus infection) (HCC) 1990   from sexual contact who is unknown   Hypertension    Syphilis    treated    Assessment: Patient denies any questions or concerns today. Visit with ID on 05/18/24 and prescribed Delstrigo . Patient to pick up prescription today. Patient Reported Symptoms:  Cognitive Cognitive Status: No symptoms reported, Alert and oriented to person, place, and time, Insightful and able to interpret abstract concepts   Health Maintenance Behaviors: Annual physical exam, Exercise Healing Pattern: Average Health Facilitated by: Rest, Stress management, Healthy diet  Neurological Neurological Review of Symptoms: No symptoms reported    HEENT HEENT Symptoms Reported: No symptoms reported      Cardiovascular Cardiovascular Symptoms Reported: No symptoms reported Does patient have uncontrolled Hypertension?: Yes Is patient checking Blood Pressure at home?: Yes Patient's Recent BP reading at home: patient reports home BP reading on 05/14/24 was. 148/84 HR 97. Office visit with ID on 05/18/24 and BP was 123/82 HR 78. Weight: 182 lb (82.6 kg)  Respiratory Respiratory Symptoms Reported: No symptoms reported    Endocrine Endocrine Symptoms Reported: No symptoms reported Is patient  diabetic?: No    Gastrointestinal Gastrointestinal Symptoms Reported: No symptoms reported      Genitourinary Genitourinary Symptoms Reported: No symptoms reported    Integumentary Integumentary Symptoms Reported: No symptoms reported    Musculoskeletal Musculoskelatal Symptoms Reviewed: Difficulty walking Other Musculoskeletal Symptoms: Per patient, peripheral neuropuathy unchanged; uses cane and walker when leaving home        Psychosocial Psychosocial Symptoms Reported: No symptoms reported            05/20/2024    1:25 PM  Depression screen PHQ 2/9  Decreased Interest 0  Down, Depressed, Hopeless 0  PHQ - 2 Score 0    Vitals:   05/20/24 1322  BP: (!) 148/84  Pulse: 97    Medications Reviewed Today     Reviewed by Rokhaya Quinn M, RN (Registered Nurse) on 05/20/24 at 1310  Med List Status: <None>   Medication Order Taking? Sig Documenting Provider Last Dose Status Informant  acyclovir  ointment (ZOVIRAX ) 5 % [id number] Yes APPLY TOPICALLY EVERY 3 (THREE) HOURS. USE AS NEEDED FOR SYMPTOMS RELIEF FOR NO MORE THAN 7 DAYS. Tad Arland POUR, CNM  Active   albuterol  (VENTOLIN  HFA) 108 (90 Base) MCG/ACT inhaler [id number] Yes INHALE 1-2 PUFFS INTO THE LUNGS EVERY 4 HOURS AS NEEDED FOR WHEEZING OR SHORTNESS OF BREATH. Rollene Almarie LABOR, MD  Active   amitriptyline  (ELAVIL ) 10 MG tablet [id number] Yes Take 10 mg by mouth at bedtime. [provider]  Active  Med Note KARLYNN, Ennio Houp M   Fri Feb 27, 2024 10:07 AM) as needed  cholecalciferol (VITAMIN D3) 25 MCG (1000 UNIT) tablet [id number] Yes Take 1,000 Units by mouth daily. gummie [provider]  Active   cyclobenzaprine  (FLEXERIL ) 5 MG tablet [id number] Yes TAKE 1 TABLET BY MOUTH THREE TIMES A DAY AS NEEDED FOR MUSCLE SPASM Rollene Almarie LABOR, MD  Active   doravirin-lamivudin-tenofov df (DELSTRIGO ) 100-300-300 MG TABS per tablet [id number]  Take 1 tablet by mouth daily.  Patient not taking: Reported on  05/20/2024   Luiz Channel, MD  Active   EPINEPHrine  (EPIPEN  2-PAK) 0.3 mg/0.3 mL IJ SOAJ injection [id number] Yes Inject 0.3 mg into the muscle as needed for anaphylaxis. Joshua Debby CROME, MD  Active   fluticasone  (FLONASE ) 50 MCG/ACT nasal spray [id number] Yes Place 2 sprays into both nostrils daily. Rollene Almarie LABOR, MD  Active            Med Note KARLYNN, HEDDY M   Fri Feb 27, 2024 10:09 AM) As needed  gabapentin  (NEURONTIN ) 100 MG capsule [id number] Yes Take 100 mg by mouth at bedtime. [provider]  Active            Med Note ALYNE, [facility] S   Tue Jul 08, 2023  8:21 AM) As needed  lidocaine  (LIDODERM ) 5 % [id number] Yes Place 1 patch onto the skin daily. Remove & Discard patch within 12 hours or as directed by MD Rollene Almarie LABOR, MD  Active            Med Note KARLYNN, Jalaiyah Throgmorton M   Fri Feb 27, 2024 10:10 AM) As needed  Multiple Vitamin (MULTIVITAMIN) tablet [id number] Yes Take 2 tablets by mouth daily. gummies [provider]  Active   spironolactone  (ALDACTONE ) 25 MG tablet [id number] Yes Take 1 tablet (25 mg total) by mouth daily. Rollene Almarie LABOR, MD  Active   sulfamethoxazole -trimethoprim  (BACTRIM  DS) 800-160 MG tablet [id number] Yes TAKE 1 TABLET BY MOUTH EVERY DAY Luiz Channel, MD  Active   traMADol (ULTRAM) 50 MG tablet [id number] Yes Take 50 mg by mouth every 4 (four) hours as needed. [provider]  Active   valACYclovir  (VALTREX ) 500 MG tablet [id number] Yes Take 500 mg by mouth 2 (two) times daily. TAKES AS PRN [provider]  Active           Recommendation:   Continue Current Plan of Care Patient to pick up newly prescribed Delstrigo  from pharmacy today   Follow Up Plan:   Telephone follow up appointment date/time:  06/21/2024 at 1:30 pm   HEDDY Shutter, RN, MSN, BSN, CCM [place]  [facility], Population Health Case Manager Phone: [phone]

## 2024-05-20 NOTE — Patient Instructions (Signed)
 Visit Information  Thank you for taking time to visit with me today. Please don't hesitate to contact me if I can be of assistance to you before our next scheduled appointment.  Our next appointment is by telephone on 06/21/24 at 1:30 pm Please call the care guide team at [phone] if you need to cancel or reschedule your appointment.   Following is a copy of your care plan:   Goals Addressed             This Visit's Progress    VBCI RN Care Plan       Problems:  Chronic Disease Management support and education needs related to HTN No Advanced Directives in place-patient reports she has received AD packet.  Goal: Over the next 90 days the Patient will collaborate with the care management team towards completion of advanced directives and report receipt of packet as evidenced by patient report  take all medications exactly as prescribed and will call provider for medication related questions as evidenced by patient report or review of chart    verbalize understanding of plan for management of HTN as evidenced by patient report and review of chart  Interventions:   Hypertension Interventions: Last practice recorded BP readings:  BP Readings from Last 3 Encounters:  05/18/24 123/82  05/10/24 (!) 176/103  04/29/24 (!) 147/98   Most recent eGFR/CrCl:  Lab Results  Component Value Date   EGFR 49 (L) 01/05/2024    No components found for: CRCL  Evaluation of current treatment plan related to hypertension self management and patient's adherence to plan as established by provider Reviewed medications with patient and discussed importance of compliance Reviewed scheduled/upcoming provider appointments including:  Discussed complications of poorly controlled blood pressure such as heart disease, stroke, circulatory complications, vision complications, kidney impairment, sexual dysfunction Screening for signs and symptoms of depression related to chronic disease state  Offered  educational material on hypertension management. Patient declined  RNCM reviewed recommendations per clinical pharmacist telephone visit on 03/26/24. RNCM advised patient to follow up with primary care re: BP readings as recommended by clinical pharmacist. Target BP <130/80 and contact provider if BP consistently >140/90. Reiterated hypertensive crisis and when to seek medical attention-Do not wait to see whether your blood pressure comes down on its own. Call 911. per american heart association: Call 911 if your blood pressure is 180/120 or higher and you are experiencing symptoms that may include: Chest pain Shortness of breath Back pain Numbness Weakness Change in vision Difficulty speaking Encouraged patient to continue to check and record blood pressure. Confirmed patient took home BP monitor to provider office to check for accuracy.  Patient Self-Care Activities:  take blood pressure log to all doctor appointments call doctor for signs and symptoms of high blood pressure keep all doctor appointments take medications for blood pressure exactly as prescribed report new symptoms to your doctor Continue to follow a low salt diet Per American Heart Association Recommendations: Call 911 if your blood pressure is 180/120 or higher; Or if you are experiencing symptoms that may include: chest pain; shortness of breath, back pain, numbness, weakness, change in vision or difficulty speaking. Continue exercise plan- aquatherapy. Consistency is key!  Plan:  Telephone follow up appointment with care management team member scheduled for:  06/21/2024 at 1:30 pm           Please call the Suicide and Crisis Lifeline: 988 call the [place]  National Suicide Prevention Lifeline: 407-383-6148 or TTY: 612-127-9159 TTY 762 687 5744) to talk  to a trained counselor call 1-800-273-TALK (toll free, 24 hour hotline) if you are experiencing a Mental Health or Behavioral Health Crisis or need someone to talk  to.  Patient verbalizes understanding of instructions and care plan provided today and agrees to view in MyChart. Active MyChart status and patient understanding of how to access instructions and care plan via MyChart confirmed with patient.     Heddy Shutter, RN, MSN, BSN, CCM [place]  [facility], Population Health Case Manager Phone: [phone]

## 2024-05-21 ENCOUNTER — Ambulatory Visit: Admitting: Internal Medicine

## 2024-05-21 NOTE — Telephone Encounter (Signed)
 FYI

## 2024-05-27 LAB — HIV-1 INTEGRASE GENOTYPE

## 2024-05-27 LAB — HIV-1 GENOTYPE: HIV-1 Genotype: DETECTED — AB

## 2024-05-27 LAB — HIV RNA, RTPCR W/R GT (RTI, PI,INT)
HIV 1 RNA Quant: 98600 {copies}/mL — ABNORMAL HIGH
HIV-1 RNA Quant, Log: 4.99 {Log_copies}/mL — ABNORMAL HIGH

## 2024-06-07 ENCOUNTER — Other Ambulatory Visit: Payer: Self-pay | Admitting: Internal Medicine

## 2024-06-07 DIAGNOSIS — I10 Essential (primary) hypertension: Secondary | ICD-10-CM

## 2024-06-21 ENCOUNTER — Other Ambulatory Visit: Payer: Self-pay

## 2024-06-21 NOTE — Patient Instructions (Signed)
 Visit Information  Thank you for taking time to visit with me today. Please don't hesitate to contact me if I can be of assistance to you before our next scheduled appointment.  Your next care management appointment is by telephone on 08/02/24 at 1:30 pm  Please call the care guide team at [phone] if you need to cancel, schedule, or reschedule an appointment.   Please call the Suicide and Crisis Lifeline: 988 call the [place]  National Suicide Prevention Lifeline: (309)878-8868 or TTY: 212-807-9569 TTY 351-127-6443) to talk to a trained counselor call 1-800-273-TALK (toll free, 24 hour hotline) if you are experiencing a Mental Health or Behavioral Health Crisis or need someone to talk to.  Heddy Shutter, RN, MSN, BSN, CCM [place]  [facility], Population Health Case Manager Phone: [phone]

## 2024-06-21 NOTE — Patient Outreach (Signed)
 Complex Care Management   Visit Note  06/21/2024  Name:  Stephanie Espinoza MRN: [id number] DOB: [birth year]  Situation: Referral received for Complex Care Management related to HTN I obtained verbal consent from Patient.  Visit completed with patient  on the phone  Background:   Past Medical History:  Diagnosis Date   Abnormal Pap smear of cervix before 1995   always a repeat back to normal except for 1 time and had colpo bioosy -resutls were normal   Allergic rhinitis    Allergy    Anxiety    Arthritis    Asthma    Bell's palsy after 1995 ?   no residual SE.   Depression    Genital herpes    History of drug abuse (HCC)    drug usser 1984 - 1990.  cocaine   HIV (human immunodeficiency virus infection) (HCC) 1990   from sexual contact who is unknown   Hypertension    Syphilis    treated    Assessment: patient reports her blood pressure has been in the usual range for her. She reports BP elevated 163/93 last week, but was very busy helping daughter to relocate. Patient checked BP today and 157/97. She reports she has eaten some foods (ie frozen dinners) that she noted to be high in sodium. And states she is going to have to monitor the foods she buys in the store more closely. RNCM reiterated primary care provider parameters with patient and when to notify provider. Patient Reported Symptoms:  Cognitive Cognitive Status: Insightful and able to interpret abstract concepts, Normal speech and language skills, No symptoms reported      Neurological Neurological Review of Symptoms: Numbness (reports h/o neuropathy) Neurological Management Strategies: Medication therapy, Routine screening Neurological Comment: chronic tingling to feet, careful to prevent falls  HEENT HEENT Symptoms Reported: No symptoms reported      Cardiovascular Cardiovascular Symptoms Reported: No symptoms reported Does patient have uncontrolled Hypertension?: Yes Is patient checking Blood Pressure at home?:  Yes Patient's Recent BP reading at home: checked today and BP 157/97 HR 79. Patient reports BP checked last week and BP was 163/93 HR 72- she reports she was very busy helping her daughter to relocate out of state. She also expressed she realizes that she has been eating frozen dinners that contain a lot of sodium and reports she will monitor the salt intake. Cardiovascular Management Strategies: Medication therapy, Activity, Diet modification Cardiovascular Self-Management Outcome: 4 (good)  Respiratory Respiratory Symptoms Reported: No symptoms reported    Endocrine Endocrine Symptoms Reported: No symptoms reported Is patient diabetic?: No    Gastrointestinal Gastrointestinal Symptoms Reported: No symptoms reported      Genitourinary Genitourinary Symptoms Reported: No symptoms reported    Integumentary Integumentary Symptoms Reported: No symptoms reported    Musculoskeletal Musculoskelatal Symptoms Reviewed: No symptoms reported Additional Musculoskeletal Details: has cane/walker and uses if she needs. usually in reaching distance if she needs it. usually ambulates without cane/walker when in the home(helps work on strengthing mucle tone and balance)   Falls in the past year?: No    Psychosocial Psychosocial Symptoms Reported: Anxiety - if selected complete GAD Behavioral Management Strategies: Adequate rest, Support system Behavioral Health Self-Management Outcome: 4 (good)          05/20/2024    1:25 PM  Depression screen PHQ 2/9  Decreased Interest 0  Down, Depressed, Hopeless 0  PHQ - 2 Score 0    Vitals:   06/21/24 1442  BP: ROLLEN)  157/97  Pulse: 79    Medications Reviewed Today     Reviewed by Emmali Karow M, RN (Registered Nurse) on 06/21/24 at 1341  Med List Status: <None>   Medication Order Taking? Sig Documenting Provider Last Dose Status Informant  acyclovir  ointment (ZOVIRAX ) 5 % [id number] Yes APPLY TOPICALLY EVERY 3 (THREE) HOURS. USE AS NEEDED FOR  SYMPTOMS RELIEF FOR NO MORE THAN 7 DAYS. Tad Arland POUR, CNM  Active   albuterol  (VENTOLIN  HFA) 108 (90 Base) MCG/ACT inhaler [id number] Yes INHALE 1-2 PUFFS INTO THE LUNGS EVERY 4 HOURS AS NEEDED FOR WHEEZING OR SHORTNESS OF BREATH. Rollene Almarie LABOR, MD  Active   amitriptyline  (ELAVIL ) 10 MG tablet [id number] Yes Take 10 mg by mouth at bedtime. [provider]  Active            Med Note KARLYNN, Chima Astorino M   Fri Feb 27, 2024 10:07 AM) as needed  cholecalciferol (VITAMIN D3) 25 MCG (1000 UNIT) tablet [id number] Yes Take 1,000 Units by mouth daily. gummie [provider]  Active   cyclobenzaprine  (FLEXERIL ) 5 MG tablet [id number] Yes TAKE 1 TABLET BY MOUTH THREE TIMES A DAY AS NEEDED FOR MUSCLE SPASM Rollene Almarie LABOR, MD  Active   doravirin-lamivudin-tenofov df (DELSTRIGO ) 100-300-300 MG TABS per tablet [id number] Yes Take 1 tablet by mouth daily. Luiz Channel, MD  Active   EPINEPHrine  (EPIPEN  2-PAK) 0.3 mg/0.3 mL IJ SOAJ injection [id number] Yes Inject 0.3 mg into the muscle as needed for anaphylaxis. Joshua Debby CROME, MD  Active   fluticasone  (FLONASE ) 50 MCG/ACT nasal spray [id number] Yes Place 2 sprays into both nostrils daily. Rollene Almarie LABOR, MD  Active            Med Note KARLYNN, HEDDY M   Fri Feb 27, 2024 10:09 AM) As needed  gabapentin  (NEURONTIN ) 100 MG capsule [id number] Yes Take 100 mg by mouth at bedtime. [provider]  Active            Med Note ALYNE, [facility] S   Tue Jul 08, 2023  8:21 AM) As needed  lidocaine  (LIDODERM ) 5 % [id number] Yes Place 1 patch onto the skin daily. Remove & Discard patch within 12 hours or as directed by MD Rollene Almarie LABOR, MD  Active            Med Note KARLYNN, Riely Baskett M   Fri Feb 27, 2024 10:10 AM) As needed  Multiple Vitamin (MULTIVITAMIN) tablet [id number] Yes Take 2 tablets by mouth daily. gummies [provider]  Active   spironolactone  (ALDACTONE ) 25 MG tablet [id number] Yes TAKE 1 TABLET (25 MG TOTAL)  BY MOUTH DAILY. Rollene Almarie LABOR, MD  Active   sulfamethoxazole -trimethoprim  (BACTRIM  DS) 800-160 MG tablet [id number] Yes TAKE 1 TABLET BY MOUTH EVERY DAY  Patient taking differently: TAKE 1 TABLET BY MOUTH EVERY DAY   Luiz Channel, MD  Active   traMADol (ULTRAM) 50 MG tablet [id number] Yes Take 50 mg by mouth every 4 (four) hours as needed. [provider]  Active   valACYclovir  (VALTREX ) 500 MG tablet [id number] Yes Take 500 mg by mouth 2 (two) times daily. TAKES AS PRN [provider]  Active           Recommendation:   Continue Current Plan of Care  Follow Up Plan:   Telephone follow up appointment date/time:  08/02/24 at 1:30 pm  HEDDY Shutter, RN, MSN, BSN, CCM [place]  [facility], Population Health Case Manager Phone: [phone]

## 2024-07-02 ENCOUNTER — Ambulatory Visit: Admitting: Internal Medicine

## 2024-07-02 ENCOUNTER — Other Ambulatory Visit: Payer: Self-pay

## 2024-07-02 ENCOUNTER — Encounter: Payer: Self-pay | Admitting: Internal Medicine

## 2024-07-02 VITALS — BP 167/96 | HR 63 | Temp 97.5°F | Ht 62.0 in | Wt 178.0 lb

## 2024-07-02 DIAGNOSIS — Z79899 Other long term (current) drug therapy: Secondary | ICD-10-CM

## 2024-07-02 DIAGNOSIS — R21 Rash and other nonspecific skin eruption: Secondary | ICD-10-CM

## 2024-07-02 DIAGNOSIS — B20 Human immunodeficiency virus [HIV] disease: Secondary | ICD-10-CM | POA: Diagnosis not present

## 2024-07-02 MED ORDER — TRIAMCINOLONE ACETONIDE 0.025 % EX OINT: 1.0000 | TOPICAL_OINTMENT | Freq: Two times a day (BID) | CUTANEOUS | 0 refills | Status: DC

## 2024-07-02 MED ORDER — HYDROXYZINE HCL 10 MG PO TABS: 10.0000 mg | ORAL_TABLET | Freq: Three times a day (TID) | ORAL | 2 refills | Status: DC | PRN

## 2024-07-02 NOTE — Progress Notes (Signed)
 Patient ID: Stephanie Espinoza, female   DOB: [birth year], 68 y.o.   MRN: [id number]  HPI Has only been on delstrigo . Tolerating thus far. Occasionally has headache and itching  Outpatient Encounter Medications as of 07/02/2024  Medication Sig   acyclovir  ointment (ZOVIRAX ) 5 % APPLY TOPICALLY EVERY 3 (THREE) HOURS. USE AS NEEDED FOR SYMPTOMS RELIEF FOR NO MORE THAN 7 DAYS.   albuterol  (VENTOLIN  HFA) 108 (90 Base) MCG/ACT inhaler INHALE 1-2 PUFFS INTO THE LUNGS EVERY 4 HOURS AS NEEDED FOR WHEEZING OR SHORTNESS OF BREATH.   amitriptyline  (ELAVIL ) 10 MG tablet Take 10 mg by mouth at bedtime.   cholecalciferol (VITAMIN D3) 25 MCG (1000 UNIT) tablet Take 1,000 Units by mouth daily. gummie   cyclobenzaprine  (FLEXERIL ) 5 MG tablet TAKE 1 TABLET BY MOUTH THREE TIMES A DAY AS NEEDED FOR MUSCLE SPASM   doravirin-lamivudin-tenofov df (DELSTRIGO ) 100-300-300 MG TABS per tablet Take 1 tablet by mouth daily.   EPINEPHrine  (EPIPEN  2-PAK) 0.3 mg/0.3 mL IJ SOAJ injection Inject 0.3 mg into the muscle as needed for anaphylaxis.   fluticasone  (FLONASE ) 50 MCG/ACT nasal spray Place 2 sprays into both nostrils daily.   gabapentin  (NEURONTIN ) 100 MG capsule Take 100 mg by mouth at bedtime.   lidocaine  (LIDODERM ) 5 % Place 1 patch onto the skin daily. Remove & Discard patch within 12 hours or as directed by MD   Multiple Vitamin (MULTIVITAMIN) tablet Take 2 tablets by mouth daily. gummies   spironolactone  (ALDACTONE ) 25 MG tablet TAKE 1 TABLET (25 MG TOTAL) BY MOUTH DAILY.   sulfamethoxazole -trimethoprim  (BACTRIM  DS) 800-160 MG tablet TAKE 1 TABLET BY MOUTH EVERY DAY (Patient taking differently: TAKE 1 TABLET BY MOUTH EVERY DAY)   traMADol (ULTRAM) 50 MG tablet Take 50 mg by mouth every 4 (four) hours as needed.   valACYclovir  (VALTREX ) 500 MG tablet Take 500 mg by mouth 2 (two) times daily. TAKES AS PRN   No facility-administered encounter medications on file as of 07/02/2024.     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   Right foot pain 05/22/2022   Right lumbar radiculopathy 05/08/2022   DDD (degenerative disc disease), lumbar 02/12/2022   Anterolisthesis of lumbosacral spine 02/12/2022   Weakness of both legs 03/07/2021   B12 deficiency 03/07/2021   Urinary incontinence 05/16/2020   Other fatigue 05/16/2020   History of syphilis 05/14/2020   Memory loss 02/14/2020   Insomnia 02/14/2020   Herpes zoster 12/31/2019   Normocytic anemia 03/01/2019   Hyperglycemia 03/01/2019   Headache 06/30/2018   Urinary frequency 05/01/2018   Cervical 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 (HPV) DNA test positive 10/18/2016   Essential hypertension 09/16/2014   Vasomotor rhinitis 05/05/2014   Benign paroxysmal positional vertigo 09/21/2012   Sinusitis 03/22/2009   Human immunodeficiency virus (HIV) disease (HCC) 09/06/2006   Genital herpes 09/06/2006   Depression 09/06/2006     Health Maintenance Due  Topic Date Due   Hepatitis B Vaccines 19-59 Average Risk (3 of 3 - Risk 3-dose series) 02/22/2003   COVID-19 Vaccine (5 - 2024-25 season) 07/06/2023   MAMMOGRAM  08/03/2023   INFLUENZA VACCINE  06/04/2024     Review of Systems  Physical Exam   BP (!) 167/96   Pulse 63   Temp (!) 97.5 F (36.4 C) (Temporal)   Ht 5'  2 (1.575 m)   Wt 178 lb (80.7 kg)   LMP 11/04/1997   SpO2 96%   BMI 32.56 kg/m   Physical Exam  Constitutional:  oriented to person, place, and time. appears well-developed and well-nourished. No distress.  HENT: Burns/AT, PERRLA, no scleral icterus Mouth/Throat: Oropharynx is clear and moist. No oropharyngeal exudate.  Cardiovascular: Normal rate, regular rhythm and normal heart sounds. Exam reveals no gallop and no friction rub.  No murmur heard.  Pulmonary/Chest: Effort normal and breath  sounds normal. No respiratory distress.  has no wheezes.  Neck = supple, no nuchal rigidity Abdominal: Soft. Bowel sounds are normal.  exhibits no distension. There is no tenderness.  Lymphadenopathy: no cervical adenopathy. No axillary adenopathy Neurological: alert and oriented to person, place, and time.  Skin: Skin is warm and dry. No rash noted. No erythema.  Psychiatric: a normal mood and affect.  behavior is normal.   Lab Results  Component Value Date   CD4TCELL 10 (L) 04/12/2024   Lab Results  Component Value Date   CD4TABS 131 (L) 04/12/2024   CD4TABS 129 (L) 01/05/2024   CD4TABS 111 (L) 10/06/2023   Lab Results  Component Value Date   HIV1RNAQUANT 98,600 (H) 05/18/2024   Lab Results  Component Value Date   HEPBSAB No 12/29/2006   Lab Results  Component Value Date   LABRPR NON-REACTIVE 01/05/2024    CBC Lab Results  Component Value Date   WBC 4.0 05/10/2024   RBC 4.28 05/10/2024   HGB 11.8 (L) 05/10/2024   HCT 36.8 05/10/2024   PLT 186.0 05/10/2024   MCV 85.9 05/10/2024   MCH 29.2 01/05/2024   MCHC 32.2 05/10/2024   RDW 13.6 05/10/2024   LYMPHSABS 1,389 08/04/2023   MONOABS 0.2 05/11/2022   EOSABS 93 01/05/2024    BMET Lab Results  Component Value Date   NA 139 05/10/2024   K 4.2 05/10/2024   CL 105 05/10/2024   CO2 27 05/10/2024   GLUCOSE 98 05/10/2024   BUN 16 05/10/2024   CREATININE 1.25 (H) 05/10/2024   CALCIUM  9.5 05/10/2024   GFRNONAA >60 05/11/2022   GFRAA >60 03/03/2019   Lab Results  Component Value Date   CD4TCELL 11 (L) 07/02/2024   CD4TABS 131 (L) 04/12/2024    Lab Results  Component Value Date   HIV1RNAQUANT 38 (H) 07/02/2024    Assessment and Plan  Hiv disease = will check labs to see if getting undetectable; continue on destrigo  Long term medication management = cr is stable  Pruritic rash = Triamcinolone  cream and atarax  = no see um bites and reaction  Addendum = appears to be near undetectable

## 2024-07-05 LAB — HIV-1 RNA QUANT-NO REFLEX-BLD
HIV 1 RNA Quant: 38 {copies}/mL — ABNORMAL HIGH
HIV-1 RNA Quant, Log: 1.58 {Log_copies}/mL — ABNORMAL HIGH

## 2024-07-05 LAB — T-HELPER CELLS (CD4) COUNT (NOT AT ARMC)
Absolute CD4: 179 {cells}/uL — ABNORMAL LOW (ref 490–1740)
CD4 T Helper %: 11 % — ABNORMAL LOW (ref 30–61)
Total lymphocyte count: 1598 {cells}/uL (ref 850–3900)

## 2024-07-07 ENCOUNTER — Encounter: Payer: Self-pay | Admitting: Internal Medicine

## 2024-07-08 NOTE — Telephone Encounter (Signed)
**Note De-identified  [place] Obfuscation** Please advise 

## 2024-08-02 ENCOUNTER — Other Ambulatory Visit: Payer: Self-pay

## 2024-08-02 NOTE — Patient Outreach (Signed)
 Complex Care Management   Visit Note  08/02/2024  Name:  Stephanie Espinoza MRN: [id number] DOB: April 16, 1956  Situation: Referral received for Complex Care Management related to HTN I obtained verbal consent from Patient.  Visit completed with Patient  on the phone  Background:   Past Medical History:  Diagnosis Date   Abnormal Pap smear of cervix before 1995   always a repeat back to normal except for 1 time and had colpo bioosy -resutls were normal   Allergic rhinitis    Allergy    Anxiety    Arthritis    Asthma    Bell's palsy after 1995 ?   no residual SE.   Depression    Genital herpes    History of drug abuse (HCC)    drug usser 1984 - 1990.  cocaine   HIV (human immunodeficiency virus infection) (HCC) 1990   from sexual contact who is unknown   Hypertension    Syphilis    treated    Assessment: Patient does not have a follow up appointment with PCP on the schedule. Patient declines to set up a follow up appointment with PCP at this time. Last office visit was 05/10/2024(BP 173/103 on 05/10/24). BP today 147/94 pulse 75. Patient states, I am not going to change anything, I am not going to take any more medicines then I have been taking, if I feel like I need to see them then I set up an appointment. Patient states, as long as my blood pressure is within range of 125-167 (systolic), then I am good. RNCM advised patient of Target BP recommendation by clinical pharmacist goal < 130/80 and notify provider if consistently > 140/90. Patient denies any SOB or swelling/edema. She states those symptoms stopped when they changed her HIV medications. Patient declines to schedule a follow up with provider, clinical pharmacist or RNCM at this time. And states she will call to schedule a follow up with PCP if she feel like she needs one. She continues to self monitor her BP/pulse/weight. Patient Reported Symptoms:  Cognitive Cognitive Status: No symptoms reported      Neurological Neurological  Review of Symptoms: Numbness (neuropathy numbness in feet sometimes hands chronic, sometimes ankles down)    HEENT HEENT Symptoms Reported: No symptoms reported      Cardiovascular Cardiovascular Symptoms Reported: No symptoms reported Does patient have uncontrolled Hypertension?: Yes Is patient checking Blood Pressure at home?: Yes Weight: 180 lb (81.6 kg) (08/02/24)  Respiratory Respiratory Symptoms Reported: No symptoms reported    Endocrine Endocrine Symptoms Reported: No symptoms reported Is patient diabetic?: No    Gastrointestinal Gastrointestinal Symptoms Reported: No symptoms reported      Genitourinary Genitourinary Symptoms Reported: No symptoms reported    Integumentary Integumentary Symptoms Reported: No symptoms reported    Musculoskeletal Musculoskelatal Symptoms Reviewed: No symptoms reported Other Musculoskeletal Symptoms: patient reports does not use cane/walker in house. states it is available if she needs them.        Psychosocial Psychosocial Symptoms Reported: No symptoms reported          08/02/2024    PHQ2-9 Depression Screening   Little interest or pleasure in doing things Not at all  Feeling down, depressed, or hopeless Not at all  PHQ-2 - Total Score 0  Trouble falling or staying asleep, or sleeping too much    Feeling tired or having little energy    Poor appetite or overeating     Feeling bad about yourself - or that  you are a failure or have let yourself or your family down    Trouble concentrating on things, such as reading the newspaper or watching television    Moving or speaking so slowly that other people could have noticed.  Or the opposite - being so fidgety or restless that you have been moving around a lot more than usual    Thoughts that you would be better off dead, or hurting yourself in some way    PHQ2-9 Total Score    If you checked off any problems, how difficult have these problems made it for you to do your work, take care of  things at home, or get along with other people    Depression Interventions/Treatment      Vitals:   08/02/24 1355  BP: (!) 147/94  Pulse: 75    Medications Reviewed Today     Reviewed by Sharise Lippy M, RN (Registered Nurse) on 08/02/24 at 1345  Med List Status: <None>   Medication Order Taking? Sig Documenting Provider Last Dose Status Informant  acyclovir  ointment (ZOVIRAX ) 5 % [id number] Yes APPLY TOPICALLY EVERY 3 (THREE) HOURS. USE AS NEEDED FOR SYMPTOMS RELIEF FOR NO MORE THAN 7 DAYS. Tad Arland POUR, CNM  Active   albuterol  (VENTOLIN  HFA) 108 (90 Base) MCG/ACT inhaler [id number] Yes INHALE 1-2 PUFFS INTO THE LUNGS EVERY 4 HOURS AS NEEDED FOR WHEEZING OR SHORTNESS OF BREATH. Rollene Almarie LABOR, MD  Active   amitriptyline  (ELAVIL ) 10 MG tablet [id number] Yes Take 10 mg by mouth at bedtime. [provider]  Active            Med Note KARLYNN, Aniceto Kyser M   Fri Feb 27, 2024 10:07 AM) as needed  cholecalciferol (VITAMIN D3) 25 MCG (1000 UNIT) tablet [id number] Yes Take 1,000 Units by mouth daily. gummie [provider]  Active   cyclobenzaprine  (FLEXERIL ) 5 MG tablet [id number] Yes TAKE 1 TABLET BY MOUTH THREE TIMES A DAY AS NEEDED FOR MUSCLE SPASM Rollene Almarie LABOR, MD  Active   doravirin-lamivudin-tenofov df (DELSTRIGO ) 100-300-300 MG TABS per tablet [id number] Yes Take 1 tablet by mouth daily. Luiz Channel, MD  Active   EPINEPHrine  (EPIPEN  2-PAK) 0.3 mg/0.3 mL IJ SOAJ injection [id number] Yes Inject 0.3 mg into the muscle as needed for anaphylaxis. Joshua Debby CROME, MD  Active   fluticasone  (FLONASE ) 50 MCG/ACT nasal spray [id number] Yes Place 2 sprays into both nostrils daily. Rollene Almarie LABOR, MD  Active            Med Note KARLYNN, HEDDY M   Fri Feb 27, 2024 10:09 AM) As needed  gabapentin  (NEURONTIN ) 100 MG capsule [id number] Yes Take 100 mg by mouth at bedtime. [provider]  Active            Med Note ALYNE, [facility] [place] S   Tue Jul 08, 2023  8:21 AM)  As needed  hydrOXYzine  (ATARAX ) 10 MG tablet [id number] Yes Take 1 tablet (10 mg total) by mouth 3 (three) times daily as needed. Luiz Channel, MD  Active   lidocaine  (LIDODERM ) 5 % [id number] Yes Place 1 patch onto the skin daily. Remove & Discard patch within 12 hours or as directed by MD Rollene Almarie LABOR, MD  Active            Med Note KARLYNN, Kedron Uno M   Fri Feb 27, 2024 10:10 AM) As needed  Multiple Vitamin (MULTIVITAMIN) tablet [id number] Yes Take 2 tablets by mouth daily. gummies [provider]  Active   spironolactone  (ALDACTONE ) 25 MG tablet [id number] Yes TAKE 1 TABLET (25 MG TOTAL) BY MOUTH DAILY. Rollene Almarie LABOR, MD  Active   sulfamethoxazole -trimethoprim  (BACTRIM  DS) 800-160 MG tablet [id number] Yes TAKE 1 TABLET BY MOUTH EVERY DAY Luiz Channel, MD  Active   traMADol (ULTRAM) 50 MG tablet [id number] Yes Take 50 mg by mouth every 4 (four) hours as needed. [provider]  Active   triamcinolone  (KENALOG ) 0.025 % ointment [id number] Yes Apply 1 Application topically 2 (two) times daily.  Patient taking differently: Apply 1 Application topically 2 (two) times daily.   Luiz Channel, MD  Active   valACYclovir  (VALTREX ) 500 MG tablet [id number] Yes Take 500 mg by mouth 2 (two) times daily. TAKES AS PRN [provider]  Active           Recommendation:   PCP Follow-up  Follow Up Plan:   Patient has met all care management goals. Care Management case will be closed. Patient has been provided contact information should new needs arise.   Heddy Shutter, RN, MSN, BSN, CCM [place]  [facility], Population Health Case Manager Phone: [phone]

## 2024-08-02 NOTE — Patient Instructions (Signed)
 Visit Information  Thank you for taking time to visit with me today. Please don't hesitate to contact me if I can be of assistance to you before our next scheduled appointment.   Patient has met all care management goals. Care Management case will be closed. Patient has been provided contact information should new needs arise.   Please call the care guide team at [phone] if you need to cancel, schedule, or reschedule an appointment.   Please call the Suicide and Crisis Lifeline: 988 call the [place]  National Suicide Prevention Lifeline: 208-654-3962 or TTY: 9373817290 TTY 517-634-6737) to talk to a trained counselor call 1-800-273-TALK (toll free, 24 hour hotline) if you are experiencing a Mental Health or Behavioral Health Crisis or need someone to talk to.  Heddy Shutter, RN, MSN, BSN, CCM [place]  [facility], Population Health Case Manager Phone: [phone]

## 2024-08-03 ENCOUNTER — Other Ambulatory Visit: Payer: Self-pay

## 2024-08-03 NOTE — Patient Outreach (Signed)
 Aging Gracefully Program  08/03/2024  Stephanie Espinoza [birth year] [id number]   [facility] Evaluation Interviewer attempted to call patient on today regarding Aging Gracefully referral. No answer from patient after multiple rings. CMA left confidential voicemail for patient to return call.  Will attempt to call back within 1 week.    [facility]  Population Health Care Management Assistant  Direct Dial: [phone]  Fax: [phone] Website: [email or web address]

## 2024-08-03 NOTE — Patient Outreach (Signed)
 Aging Gracefully Program  08/03/2024  Stephanie Espinoza 04-Sep-1956 [id number]   [facility] Evaluation Interviewer made contact with patient. Aging Gracefully 5 month survey completed.    [facility]  Population Health Care Management Assistant  Direct Dial: [phone]  Fax: [phone] Website: [email or web address]

## 2024-08-08 ENCOUNTER — Encounter (HOSPITAL_BASED_OUTPATIENT_CLINIC_OR_DEPARTMENT_OTHER): Payer: Self-pay | Admitting: Obstetrics & Gynecology

## 2024-08-08 ENCOUNTER — Encounter: Payer: Self-pay | Admitting: Internal Medicine

## 2024-08-09 ENCOUNTER — Other Ambulatory Visit (HOSPITAL_BASED_OUTPATIENT_CLINIC_OR_DEPARTMENT_OTHER): Payer: Self-pay | Admitting: Obstetrics & Gynecology

## 2024-08-09 DIAGNOSIS — N760 Acute vaginitis: Secondary | ICD-10-CM

## 2024-08-09 MED ORDER — FLUCONAZOLE 150 MG PO TABS: ORAL_TABLET | ORAL | 0 refills | Status: AC

## 2024-08-09 NOTE — Telephone Encounter (Signed)
 OBGYN - sent in meds for pt this morning.

## 2024-08-09 NOTE — Telephone Encounter (Signed)
 Needs appointment

## 2024-08-16 ENCOUNTER — Other Ambulatory Visit (HOSPITAL_BASED_OUTPATIENT_CLINIC_OR_DEPARTMENT_OTHER): Payer: Self-pay | Admitting: Obstetrics & Gynecology

## 2024-08-16 DIAGNOSIS — N760 Acute vaginitis: Secondary | ICD-10-CM

## 2024-08-23 ENCOUNTER — Ambulatory Visit (INDEPENDENT_AMBULATORY_CARE_PROVIDER_SITE_OTHER): Admitting: Internal Medicine

## 2024-08-23 ENCOUNTER — Other Ambulatory Visit: Payer: Self-pay

## 2024-08-23 ENCOUNTER — Encounter: Payer: Self-pay | Admitting: Internal Medicine

## 2024-08-23 VITALS — BP 173/104 | HR 73 | Temp 98.8°F | Resp 16 | Wt 178.6 lb

## 2024-08-23 DIAGNOSIS — F5101 Primary insomnia: Secondary | ICD-10-CM

## 2024-08-23 DIAGNOSIS — Z23 Encounter for immunization: Secondary | ICD-10-CM | POA: Diagnosis not present

## 2024-08-23 DIAGNOSIS — Z79899 Other long term (current) drug therapy: Secondary | ICD-10-CM | POA: Diagnosis not present

## 2024-08-23 DIAGNOSIS — I10 Essential (primary) hypertension: Secondary | ICD-10-CM

## 2024-08-23 DIAGNOSIS — B20 Human immunodeficiency virus [HIV] disease: Secondary | ICD-10-CM | POA: Diagnosis not present

## 2024-08-23 MED ORDER — ZOLPIDEM TARTRATE 5 MG PO TABS: 5.0000 mg | ORAL_TABLET | Freq: Every evening | ORAL | 0 refills | Status: DC | PRN

## 2024-08-23 NOTE — Progress Notes (Signed)
 RFV: follow up for HIV disease Patient ID: Stephanie Espinoza, female   DOB: [birth year], 68 y.o.   MRN: [id number]  HPI Stephanie Espinoza is a 68yo F with hiv disease, currently on delstrigo . CD 4 count of 179/VL 38.( Aug 2025). Thus far tolerating. She was intolerant to other medication regimens. Takes in the morning But still having insomnia She was recently treated for vaginitis, but otherwise doing well.  Outpatient Encounter Medications as of 08/23/2024  Medication Sig   acyclovir  ointment (ZOVIRAX ) 5 % APPLY TOPICALLY EVERY 3 (THREE) HOURS. USE AS NEEDED FOR SYMPTOMS RELIEF FOR NO MORE THAN 7 DAYS.   albuterol  (VENTOLIN  HFA) 108 (90 Base) MCG/ACT inhaler INHALE 1-2 PUFFS INTO THE LUNGS EVERY 4 HOURS AS NEEDED FOR WHEEZING OR SHORTNESS OF BREATH.   amitriptyline  (ELAVIL ) 10 MG tablet Take 10 mg by mouth at bedtime.   cholecalciferol (VITAMIN D3) 25 MCG (1000 UNIT) tablet Take 1,000 Units by mouth daily. gummie   cyclobenzaprine  (FLEXERIL ) 5 MG tablet TAKE 1 TABLET BY MOUTH THREE TIMES A DAY AS NEEDED FOR MUSCLE SPASM   doravirin-lamivudin-tenofov df (DELSTRIGO ) 100-300-300 MG TABS per tablet Take 1 tablet by mouth daily.   EPINEPHrine  (EPIPEN  2-PAK) 0.3 mg/0.3 mL IJ SOAJ injection Inject 0.3 mg into the muscle as needed for anaphylaxis.   fluconazole (DIFLUCAN) 150 MG tablet Take 1 tablet and repeat in 72 hours if needed   fluticasone  (FLONASE ) 50 MCG/ACT nasal spray Place 2 sprays into both nostrils daily.   gabapentin  (NEURONTIN ) 100 MG capsule Take 100 mg by mouth at bedtime.   hydrOXYzine  (ATARAX ) 10 MG tablet Take 1 tablet (10 mg total) by mouth 3 (three) times daily as needed.   lidocaine  (LIDODERM ) 5 % Place 1 patch onto the skin daily. Remove & Discard patch within 12 hours or as directed by MD   Multiple Vitamin (MULTIVITAMIN) tablet Take 2 tablets by mouth daily. gummies   spironolactone  (ALDACTONE ) 25 MG tablet TAKE 1 TABLET (25 MG TOTAL) BY MOUTH DAILY.   sulfamethoxazole -trimethoprim   (BACTRIM  DS) 800-160 MG tablet TAKE 1 TABLET BY MOUTH EVERY DAY   traMADol (ULTRAM) 50 MG tablet Take 50 mg by mouth every 4 (four) hours as needed.   triamcinolone  (KENALOG ) 0.025 % ointment Apply 1 Application topically 2 (two) times daily. (Patient taking differently: Apply 1 Application topically 2 (two) times daily.)   valACYclovir  (VALTREX ) 500 MG tablet Take 500 mg by mouth 2 (two) times daily. TAKES AS PRN   No facility-administered encounter medications on file as of 08/23/2024.     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   Right foot pain 05/22/2022   Right lumbar radiculopathy 05/08/2022   DDD (degenerative disc disease), lumbar 02/12/2022   Anterolisthesis of lumbosacral spine 02/12/2022   Weakness of both legs 03/07/2021   B12 deficiency 03/07/2021   Urinary incontinence 05/16/2020   Other fatigue 05/16/2020   History of syphilis 05/14/2020   Memory loss 02/14/2020   Insomnia 02/14/2020   Herpes zoster 12/31/2019   Normocytic anemia 03/01/2019   Hyperglycemia 03/01/2019   Headache 06/30/2018   Urinary frequency 05/01/2018   Cervical 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 (HPV) DNA test positive 10/18/2016   Essential hypertension 09/16/2014   Vasomotor rhinitis 05/05/2014   Benign paroxysmal positional vertigo 09/21/2012   Sinusitis 03/22/2009   Human immunodeficiency  virus (HIV) disease (HCC) 09/06/2006   Genital herpes 09/06/2006   Depression 09/06/2006     Health Maintenance Due  Topic Date Due   Hepatitis B Vaccines 19-59 Average Risk (3 of 3 - Risk 3-dose series) 02/22/2003   Mammogram  08/03/2023   Influenza Vaccine  06/04/2024   COVID-19 Vaccine (5 - 2025-26 season) 07/05/2024   Fecal DNA (Cologuard)  08/14/2024   Medicare Annual Wellness (AWV)  10/21/2024      Review of Systems Review of Systems  Constitutional: Negative for fever, chills, diaphoresis, activity change, appetite change, fatigue and unexpected weight change.  HENT: Negative for congestion, sore throat, rhinorrhea, sneezing, trouble swallowing and sinus pressure.  Eyes: Negative for photophobia and visual disturbance.  Respiratory: Negative for cough, chest tightness, shortness of breath, wheezing and stridor.  Cardiovascular: Negative for chest pain, palpitations and leg swelling.  Gastrointestinal: Negative for nausea, vomiting, abdominal pain, diarrhea, constipation, blood in stool, abdominal distention and anal bleeding.  Genitourinary: Negative for dysuria, hematuria, flank pain and difficulty urinating.  Musculoskeletal: Negative for myalgias, back pain, joint swelling, arthralgias and gait problem.  Skin: Negative for color change, pallor, rash and wound.  Neurological: Negative for dizziness, tremors, weakness and light-headedness.  Hematological: Negative for adenopathy. Does not bruise/bleed easily.  Psychiatric/Behavioral: Negative for behavioral problems, confusion, sleep disturbance, dysphoric mood, decreased concentration and agitation.   Physical Exam  BP (!) 173/104   Pulse 73   Temp 98.8 F (37.1 C) (Oral)   Resp 16   Wt 178 lb 9.6 oz (81 kg)   LMP 11/04/1997   SpO2 97%   BMI 32.67 kg/m  Physical Exam  Constitutional:  oriented to person, place, and time. appears well-developed and well-nourished. No distress.  HENT: [place]/AT, PERRLA, no scleral icterus Mouth/Throat: Oropharynx is clear and moist. No oropharyngeal exudate.  Cardiovascular: Normal rate, regular rhythm and normal heart sounds. Exam reveals no gallop and no friction rub.  No murmur heard.  Pulmonary/Chest: Effort normal and breath sounds normal. No respiratory distress.  has no wheezes.  Neck = supple, no nuchal rigidity Lymphadenopathy: no cervical adenopathy. No axillary  adenopathy Neurological: alert and oriented to person, place, and time.  Skin: Skin is warm and dry. No rash noted. No erythema.  Psychiatric: a normal mood and affect.  behavior is normal.    Lab Results  Component Value Date   CD4TCELL 11 (L) 07/02/2024   Lab Results  Component Value Date   CD4TABS 131 (L) 04/12/2024   CD4TABS 129 (L) 01/05/2024   CD4TABS 111 (L) 10/06/2023   Lab Results  Component Value Date   HIV1RNAQUANT 38 (H) 07/02/2024   Lab Results  Component Value Date   HEPBSAB No 12/29/2006   Lab Results  Component Value Date   LABRPR NON-REACTIVE 01/05/2024    CBC Lab Results  Component Value Date   WBC 4.0 05/10/2024   RBC 4.28 05/10/2024   HGB 11.8 (L) 05/10/2024   HCT 36.8 05/10/2024   PLT 186.0 05/10/2024   MCV 85.9 05/10/2024   MCH 29.2 01/05/2024   MCHC 32.2 05/10/2024   RDW 13.6 05/10/2024   LYMPHSABS 1,389 08/04/2023   MONOABS 0.2 05/11/2022   EOSABS 93 01/05/2024    BMET Lab Results  Component Value Date   NA 139 05/10/2024   K 4.2 05/10/2024   CL 105 05/10/2024   CO2 27 05/10/2024   GLUCOSE 98 05/10/2024   BUN 16 05/10/2024   CREATININE 1.25 (H) 05/10/2024   CALCIUM  9.5 05/10/2024  GFRNONAA >60 05/11/2022   GFRAA >60 03/03/2019      Assessment and Plan HIV disease = Continue delstrigo  Recommend to continue with bactrim , since CD 4 count <200.  Will check cd 4 count and VL  Long term medication = Will check cr to see that she is at baseline  Insomnia = ambien for sleep   Hypertension = not at goal. Suspect some of the issue is white coat syndrome. Continue to monitor at next visit before decision for new medication  Health maintenance = She will receive flu and covid vaccine  See back in 3 months

## 2024-08-24 LAB — T-HELPER CELL (CD4) - (RCID CLINIC ONLY)
CD4 % Helper T Cell: 9 % — ABNORMAL LOW (ref 33–65)
CD4 T Cell Abs: 118 /uL — ABNORMAL LOW (ref 400–1790)

## 2024-08-25 LAB — CBC WITH DIFFERENTIAL/PLATELET
Absolute Lymphocytes: 1487 {cells}/uL (ref 850–3900)
Absolute Monocytes: 432 {cells}/uL (ref 200–950)
Basophils Absolute: 42 {cells}/uL (ref 0–200)
Basophils Relative: 0.8 %
Eosinophils Absolute: 109 {cells}/uL (ref 15–500)
Eosinophils Relative: 2.1 %
HCT: 38.2 % (ref 35.0–45.0)
Hemoglobin: 12.5 g/dL (ref 11.7–15.5)
MCH: 29.5 pg (ref 27.0–33.0)
MCHC: 32.7 g/dL (ref 32.0–36.0)
MCV: 90.1 fL (ref 80.0–100.0)
MPV: 10.2 fL (ref 7.5–12.5)
Monocytes Relative: 8.3 %
Neutro Abs: 3130 {cells}/uL (ref 1500–7800)
Neutrophils Relative %: 60.2 %
Platelets: 208 Thousand/uL (ref 140–400)
RBC: 4.24 Million/uL (ref 3.80–5.10)
RDW: 13.9 % (ref 11.0–15.0)
Total Lymphocyte: 28.6 %
WBC: 5.2 Thousand/uL (ref 3.8–10.8)

## 2024-08-25 LAB — BASIC METABOLIC PANEL WITH GFR
BUN/Creatinine Ratio: 13 (calc) (ref 6–22)
BUN: 16 mg/dL (ref 7–25)
CO2: 28 mmol/L (ref 20–32)
Calcium: 9.7 mg/dL (ref 8.6–10.4)
Chloride: 106 mmol/L (ref 98–110)
Creat: 1.23 mg/dL — ABNORMAL HIGH (ref 0.50–1.05)
Glucose, Bld: 88 mg/dL (ref 65–99)
Potassium: 4.2 mmol/L (ref 3.5–5.3)
Sodium: 141 mmol/L (ref 135–146)
eGFR: 49 mL/min/1.73m2 — ABNORMAL LOW (ref >=60)

## 2024-08-25 LAB — HIV-1 RNA QUANT-NO REFLEX-BLD
HIV 1 RNA Quant: 24 {copies}/mL — ABNORMAL HIGH
HIV-1 RNA Quant, Log: 1.38 {Log_copies}/mL — ABNORMAL HIGH

## 2024-08-27 ENCOUNTER — Encounter: Payer: Self-pay | Admitting: Internal Medicine

## 2024-08-27 NOTE — Telephone Encounter (Signed)
**Note De-identified  [place] Obfuscation** Please advise 

## 2024-09-01 NOTE — Addendum Note (Signed)
 Addended by: ROSALVA LEX RAMAN on: 09/01/2024 07:59 AM   Modules accepted: Orders

## 2024-09-15 ENCOUNTER — Other Ambulatory Visit: Payer: Self-pay | Admitting: Internal Medicine

## 2024-09-17 ENCOUNTER — Other Ambulatory Visit: Payer: Self-pay

## 2024-09-17 NOTE — Telephone Encounter (Signed)
 Child Psychotherapist (Cologuard) and spoke with [place]. Was informed that order was not received. Given main fax number and alternate. Advised to check back on Monday to see if fax was received. Faxed to [phone] (main number) and fax# [phone] (alt. Number) Spoke with pt and she is aware.

## 2024-10-01 DIAGNOSIS — Z1211 Encounter for screening for malignant neoplasm of colon: Secondary | ICD-10-CM | POA: Diagnosis not present

## 2024-10-01 DIAGNOSIS — Z1212 Encounter for screening for malignant neoplasm of rectum: Secondary | ICD-10-CM | POA: Diagnosis not present

## 2024-10-03 LAB — COLOGUARD: Cologuard: NEGATIVE

## 2024-10-13 ENCOUNTER — Ambulatory Visit: Payer: Self-pay | Admitting: Internal Medicine

## 2024-10-13 LAB — COLOGUARD: COLOGUARD: NEGATIVE

## 2024-10-14 ENCOUNTER — Encounter: Payer: Self-pay | Admitting: Internal Medicine

## 2024-10-22 ENCOUNTER — Ambulatory Visit

## 2024-10-22 VITALS — BP 150/90 | HR 67 | Ht 62.0 in | Wt 179.8 lb

## 2024-10-22 DIAGNOSIS — Z0000 Encounter for general adult medical examination without abnormal findings: Secondary | ICD-10-CM | POA: Diagnosis not present

## 2024-10-22 NOTE — Patient Instructions (Addendum)
 Stephanie Espinoza,  Thank you for taking the time for your Medicare Wellness Visit. I appreciate your continued commitment to your health goals. Please review the care plan we discussed, and feel free to reach out if I can assist you further.  Please note that Annual Wellness Visits do not include a physical exam. Some assessments may be limited, especially if the visit was conducted virtually. If needed, we may recommend an in-person follow-up with your provider.  Ongoing Care Seeing your primary care provider every 3 to 6 months helps us  monitor your health and provide consistent, personalized care. Last office visit on 05/10/2024  Remember to call the office to get scheduled for your net office visit.  Referrals If a referral was made during today's visit and you haven't received any updates within two weeks, please contact the referred provider directly to check on the status.  Recommended Screenings:  Health Maintenance  Topic Date Due   Breast Cancer Screening  08/03/2023   Medicare Annual Wellness Visit  10/21/2024   COVID-19 Vaccine (6 - 2025-26 season) 02/21/2025   DTaP/Tdap/Td vaccine (2 - Td or Tdap) 10/10/2026   Cologuard (Stool DNA test)  10/04/2027   Pneumococcal Vaccine for age over 53  Completed   Flu Shot  Completed   Osteoporosis screening with Bone Density Scan  Completed   Hepatitis C Screening  Completed   Zoster (Shingles) Vaccine  Completed   Meningitis B Vaccine  Aged Out   Hepatitis B Vaccine  Discontinued       02/13/2024    1:42 PM  Advanced Directives  Does Patient Have a Medical Advance Directive? No  Would patient like information on creating a medical advance directive? Yes (MAU/Ambulatory/Procedural Areas - Information given)    Vision: Annual vision screenings are recommended for early detection of glaucoma, cataracts, and diabetic retinopathy. These exams can also reveal signs of chronic conditions such as diabetes and high blood pressure.  Dental:  Annual dental screenings help detect early signs of oral cancer, gum disease, and other conditions linked to overall health, including heart disease and diabetes.  Please see the attached documents for additional preventive care recommendations.

## 2024-10-22 NOTE — Progress Notes (Signed)
 "   Chief Complaint  Patient presents with   Medicare Wellness     Subjective:   Stephanie Espinoza is a 68 y.o. female who presents for a Medicare Annual Wellness Visit.  Visit info / Clinical Intake: Medicare Wellness Visit Type:: Initial Annual Wellness Visit Persons participating in visit and providing information:: patient Medicare Wellness Visit Mode:: In-person (required for WTM) Interpreter Needed?: No Pre-visit prep was completed: yes AWV questionnaire completed by patient prior to visit?: yes Date:: 10/18/24 Living arrangements:: (!) (Patient-Rptd) lives alone Patient's Overall Health Status Rating: (Patient-Rptd) good Typical amount of pain: (Patient-Rptd) some Does pain affect daily life?: (Patient-Rptd) no Are you currently prescribed opioids?: no  Dietary Habits and Nutritional Risks How many meals a day?: (Patient-Rptd) 2 Eats fruit and vegetables daily?: (!) (Patient-Rptd) no Most meals are obtained by: (Patient-Rptd) preparing own meals In the last 2 weeks, have you had any of the following?: none Diabetic:: no  Functional Status Activities of Daily Living (to include ambulation/medication): (Patient-Rptd) Independent Ambulation: Independent with device- listed below Home Assistive Devices/Equipment: Eyeglasses Medication Administration: (Patient-Rptd) Independent Home Management (perform basic housework or laundry): (Patient-Rptd) Independent Manage your own finances?: (Patient-Rptd) yes Primary transportation is: (Patient-Rptd) driving Concerns about vision?: no *vision screening is required for WTM* Concerns about hearing?: no  Fall Screening Falls in the past year?: (Patient-Rptd) 0 Number of falls in past year: 0 Was there an injury with Fall?: 0 Fall Risk Category Calculator: 0 Patient Fall Risk Level: Low Fall Risk  Fall Risk Patient at Risk for Falls Due to: No Fall Risks Fall risk Follow up: Falls evaluation completed; Falls prevention  discussed  Home and Transportation Safety: All rugs have non-skid backing?: (Patient-Rptd) N/A, no rugs All stairs or steps have railings?: (Patient-Rptd) yes Grab bars in the bathtub or shower?: (Patient-Rptd) yes Have non-skid surface in bathtub or shower?: (Patient-Rptd) yes Good home lighting?: (Patient-Rptd) yes Regular seat belt use?: (!) (Patient-Rptd) no Hospital stays in the last year:: (Patient-Rptd) no  Cognitive Assessment Difficulty concentrating, remembering, or making decisions? : (Patient-Rptd) yes  Advance Directives (For Healthcare) Does Patient Have a Medical Advance Directive?: No Would patient like information on creating a medical advance directive?: Yes (MAU/Ambulatory/Procedural Areas - Information given)    Allergies (verified) Gabapentin , Haldol [haloperidol], Nevirapine, Thorazine [chlorpromazine], Amlodipine , and Augmentin  [amoxicillin -pot clavulanate]   Current Medications (verified) Outpatient Encounter Medications as of 10/22/2024  Medication Sig   acyclovir  ointment (ZOVIRAX ) 5 % APPLY TOPICALLY EVERY 3 (THREE) HOURS. USE AS NEEDED FOR SYMPTOMS RELIEF FOR NO MORE THAN 7 DAYS.   albuterol  (VENTOLIN  HFA) 108 (90 Base) MCG/ACT inhaler INHALE 1-2 PUFFS INTO THE LUNGS EVERY 4 HOURS AS NEEDED FOR WHEEZING OR SHORTNESS OF BREATH.   amitriptyline  (ELAVIL ) 10 MG tablet Take 10 mg by mouth at bedtime.   cholecalciferol (VITAMIN D3) 25 MCG (1000 UNIT) tablet Take 1,000 Units by mouth daily. gummie   cyclobenzaprine  (FLEXERIL ) 5 MG tablet TAKE 1 TABLET BY MOUTH THREE TIMES A DAY AS NEEDED FOR MUSCLE SPASM (Patient taking differently: Take 5 mg by mouth 3 (three) times daily as needed.)   doravirin-lamivudin-tenofov df (DELSTRIGO ) 100-300-300 MG TABS per tablet Take 1 tablet by mouth daily.   EPINEPHrine  (EPIPEN  2-PAK) 0.3 mg/0.3 mL IJ SOAJ injection Inject 0.3 mg into the muscle as needed for anaphylaxis.   fluticasone  (FLONASE ) 50 MCG/ACT nasal spray Place 2  sprays into both nostrils daily.   gabapentin  (NEURONTIN ) 100 MG capsule Take 100 mg by mouth at bedtime.  lidocaine  (LIDODERM ) 5 % Place 1 patch onto the skin daily. Remove & Discard patch within 12 hours or as directed by MD   Multiple Vitamin (MULTIVITAMIN) tablet Take 2 tablets by mouth daily. gummies   spironolactone  (ALDACTONE ) 25 MG tablet TAKE 1 TABLET (25 MG TOTAL) BY MOUTH DAILY. (Patient taking differently: Take 25 mg by mouth daily as needed.)   traMADol (ULTRAM) 50 MG tablet Take 50 mg by mouth every 4 (four) hours as needed.   valACYclovir  (VALTREX ) 500 MG tablet Take 500 mg by mouth 2 (two) times daily. TAKES AS PRN (Patient taking differently: Take 500 mg by mouth 2 (two) times daily as needed. TAKES AS PRN)   zolpidem  (AMBIEN ) 5 MG tablet Take 1 tablet (5 mg total) by mouth at bedtime as needed for sleep.   fluconazole  (DIFLUCAN ) 150 MG tablet Take 1 tablet and repeat in 72 hours if needed   No facility-administered encounter medications on file as of 10/22/2024.    History: Past Medical History:  Diagnosis Date   Abnormal Pap smear of cervix before 1995   always a repeat back to normal except for 1 time and had colpo bioosy -resutls were normal   Allergic rhinitis    Allergy    Anxiety    Arthritis    Asthma    Bell's palsy after 1995 ?   no residual SE.   Depression    Genital herpes    History of drug abuse (HCC)    drug usser 1984 - 1990.  cocaine   HIV (human immunodeficiency virus infection) (HCC) 1990   from sexual contact who is unknown   Hypertension    Syphilis    treated   Past Surgical History:  Procedure Laterality Date   CESAREAN SECTION  11/05/1995   CHOLECYSTECTOMY     ESOPHAGOGASTRODUODENOSCOPY (EGD) WITH PROPOFOL  N/A 03/02/2019   Procedure: ESOPHAGOGASTRODUODENOSCOPY (EGD) WITH PROPOFOL ;  Surgeon: Burnette Fallow, MD;  Location: [facility];  Service: Endoscopy;  Laterality: N/A;   EUS N/A 03/02/2019   Procedure: FULL UPPER ENDOSCOPIC  ULTRASOUND (EUS) RADIAL;  Surgeon: Burnette Fallow, MD;  Location: [facility];  Service: Endoscopy;  Laterality: N/A;   LAPAROSCOPIC CHOLECYSTECTOMY SINGLE PORT N/A 03/01/2019   Procedure: LAPAROSCOPIC CHOLECYSTECTOMY SINGLE SITE/ INTRAOPERATIVE CHOLANGIOGRAM;  Surgeon: Sheldon Standing, MD;  Location: [facility];  Service: General;  Laterality: N/A;   Family History  Problem Relation Age of Onset   Lung cancer Mother    Diabetes Father    Breast cancer Other    Social History   Occupational History   Occupation: Retired  Tobacco Use   Smoking status: Former    Current packs/day: 0.00    Types: Cigarettes    Quit date: 03/01/1990    Years since quitting: 34.6   Smokeless tobacco: Never  Vaping Use   Vaping status: Never Used  Substance and Sexual Activity   Alcohol use: No   Drug use: No   Sexual activity: Not Currently    Partners: Male    Birth control/protection: Post-menopausal    Comment: declined condoms   Tobacco Counseling Counseling given: Not Answered  SDOH Screenings   Food Insecurity: Food Insecurity Present (10/18/2024)  Housing: Low Risk (10/18/2024)  Transportation Needs: No Transportation Needs (10/18/2024)  Utilities: Not At Risk (10/22/2024)  Depression (PHQ2-9): Low Risk (10/22/2024)  Financial Resource Strain: Medium Risk (10/18/2024)  Physical Activity: Inactive (10/18/2024)  Social Connections: Socially Isolated (10/18/2024)  Stress: No Stress Concern Present (10/18/2024)  Tobacco Use: Medium Risk (10/22/2024)  Health Literacy: Adequate Health Literacy (10/22/2024)   See flowsheets for full screening details  Depression Screen Depression Screening Exception Documentation Depression Screening Exception:: Patient refusal  PHQ 2 & 9 Depression Scale- Over the past 2 weeks, how often have you been bothered by any of the following problems? Little interest or pleasure in doing things: 0 Feeling down, depressed, or hopeless (PHQ Adolescent also  includes...irritable): 0 PHQ-2 Total Score: 0 Trouble falling or staying asleep, or sleeping too much: 0 Feeling tired or having little energy: 0 Poor appetite or overeating (PHQ Adolescent also includes...weight loss): 0 Feeling bad about yourself - or that you are a failure or have let yourself or your family down: 0 Trouble concentrating on things, such as reading the newspaper or watching television (PHQ Adolescent also includes...like school work): 0 Moving or speaking so slowly that other people could have noticed. Or the opposite - being so fidgety or restless that you have been moving around a lot more than usual: 0 Thoughts that you would be better off dead, or of hurting yourself in some way: 0 PHQ-9 Total Score: 0 If you checked off any problems, how difficult have these problems made it for you to do your work, take care of things at home, or get along with other people?: Not difficult at all  Depression Treatment Depression Interventions/Treatment : EYV7-0 Score <4 Follow-up Not Indicated     Goals Addressed               This Visit's Progress     Patient Stated (pt-stated)        Not at this time/2025             Objective:    Today's Vitals   10/22/24 1346  BP: (!) 150/90  Pulse: 67  SpO2: 97%  Weight: 179 lb 12.8 oz (81.6 kg)  Height: 5' 2 (1.575 m)   Body mass index is 32.89 kg/m.  Hearing/Vision screen Hearing Screening - Comments:: Denies hearing difficulties   Vision Screening - Comments:: Wears eyeglasses/ UTD/ [facility] Immunizations and Health Maintenance Health Maintenance  Topic Date Due   Mammogram  08/03/2023   COVID-19 Vaccine (6 - 2025-26 season) 02/21/2025   Medicare Annual Wellness (AWV)  10/22/2025   DTaP/Tdap/Td (2 - Td or Tdap) 10/10/2026   Fecal DNA (Cologuard)  10/04/2027   Pneumococcal Vaccine: 50+ Years  Completed   Influenza Vaccine  Completed   Bone Density Scan  Completed   Hepatitis C Screening   Completed   Zoster Vaccines- Shingrix  Completed   Meningococcal B Vaccine  Aged Out   Hepatitis B Vaccines 19-59 Average Risk  Discontinued        Assessment/Plan:  This is a routine wellness examination for [place].  Patient Care Team: Rollene Almarie LABOR, MD as PCP - General (Internal Medicine) Elaine Rush, MD (Inactive) as PCP - Infectious Diseases (Infectious Diseases) Sheldon Standing, MD as Consulting Physician (General Surgery) Laurier Clarita CROME, LCSW (Inactive) as Counselor Wallace, Juana M, RN as [facility]  I have personally reviewed and noted the following in the patients chart:   Medical and social history Use of alcohol, tobacco or illicit drugs  Current medications and supplements including opioid prescriptions. Functional ability and status Nutritional status Physical activity Advanced directives List of other physicians Hospitalizations, surgeries, and ER visits in previous 12 months Vitals Screenings to include cognitive, depression, and falls Referrals and appointments  No orders of the  defined types were placed in this encounter.  In addition, I have reviewed and discussed with patient certain preventive protocols, quality metrics, and best practice recommendations. A written personalized care plan for preventive services as well as general preventive health recommendations were provided to patient.   Shaunte Tuft L Elene Downum, CMA   10/22/2024   Return in 1 year (on 10/22/2025).  After Visit Summary: (MyChart) Due to this being a telephonic visit, the after visit summary with patients personalized plan was offered to patient via MyChart   Nurse Notes: Patient declines a mammogram.  She stated that she would call the office to schedule her next visit.  Patient had no other concerns to address today.  "

## 2024-10-30 ENCOUNTER — Encounter: Payer: Self-pay | Admitting: Internal Medicine

## 2024-11-01 ENCOUNTER — Other Ambulatory Visit (HOSPITAL_BASED_OUTPATIENT_CLINIC_OR_DEPARTMENT_OTHER): Payer: Self-pay | Admitting: Certified Nurse Midwife

## 2024-11-01 ENCOUNTER — Other Ambulatory Visit: Payer: Self-pay | Admitting: Internal Medicine

## 2024-11-01 DIAGNOSIS — A609 Anogenital herpesviral infection, unspecified: Secondary | ICD-10-CM

## 2024-11-01 MED ORDER — ZOLPIDEM TARTRATE 5 MG PO TABS: 5.0000 mg | ORAL_TABLET | Freq: Every evening | ORAL | 0 refills | Status: AC | PRN

## 2024-11-01 NOTE — Telephone Encounter (Signed)
 Secure chat sent to provider.   Mattias Walmsley, BSN, RN

## 2024-11-05 MED ORDER — HYDROCHLOROTHIAZIDE 12.5 MG PO CAPS: 12.5000 mg | ORAL_CAPSULE | Freq: Every day | ORAL | 3 refills | Status: AC

## 2024-11-08 NOTE — Telephone Encounter (Signed)
 Pt is aware through MyChart message.

## 2024-11-24 MED ORDER — ATENOLOL 50 MG PO TABS: 50.0000 mg | ORAL_TABLET | Freq: Every day | ORAL | 3 refills | Status: AC

## 2024-11-24 NOTE — Addendum Note (Signed)
 Addended by: ROLLENE NORRIS A on: 11/24/2024 10:15 AM   Modules accepted: Orders

## 2024-11-25 ENCOUNTER — Other Ambulatory Visit (HOSPITAL_BASED_OUTPATIENT_CLINIC_OR_DEPARTMENT_OTHER): Payer: Self-pay | Admitting: Certified Nurse Midwife

## 2024-11-25 DIAGNOSIS — A609 Anogenital herpesviral infection, unspecified: Secondary | ICD-10-CM

## 2024-11-26 ENCOUNTER — Other Ambulatory Visit (HOSPITAL_BASED_OUTPATIENT_CLINIC_OR_DEPARTMENT_OTHER): Payer: Self-pay

## 2024-12-08 ENCOUNTER — Other Ambulatory Visit: Payer: Self-pay

## 2024-12-08 NOTE — Patient Outreach (Signed)
 Aging Gracefully Program  12/08/2024  Stephanie Espinoza [birth year] [id number]   [facility] Evaluation Interviewer made contact with patient. Aging Gracefully 9 month survey completed.    Shereen [facility]  Population Health Care Management Assistant  Direct Dial: [phone]  Fax: [phone] Website: [email or web address]

## 2025-04-13 ENCOUNTER — Ambulatory Visit (HOSPITAL_BASED_OUTPATIENT_CLINIC_OR_DEPARTMENT_OTHER): Payer: Self-pay | Admitting: Obstetrics & Gynecology
